# Patient Record
Sex: Female | Born: 1945 | Race: Black or African American | Hispanic: No | Smoking: Former smoker
Health system: Southern US, Community
[De-identification: ages and names within clinical notes are randomized; demographics above are authoritative.]

## PROBLEM LIST (undated history)

## (undated) DIAGNOSIS — I10 Essential (primary) hypertension: Secondary | ICD-10-CM

## (undated) DIAGNOSIS — E039 Hypothyroidism, unspecified: Secondary | ICD-10-CM

## (undated) DIAGNOSIS — E669 Obesity, unspecified: Secondary | ICD-10-CM

## (undated) DIAGNOSIS — L299 Pruritus, unspecified: Principal | ICD-10-CM

## (undated) DIAGNOSIS — R300 Dysuria: Secondary | ICD-10-CM

## (undated) DIAGNOSIS — E11610 Type 2 diabetes mellitus with diabetic neuropathic arthropathy: Secondary | ICD-10-CM

## (undated) DIAGNOSIS — N390 Urinary tract infection, site not specified: Secondary | ICD-10-CM

## (undated) DIAGNOSIS — E785 Hyperlipidemia, unspecified: Secondary | ICD-10-CM

## (undated) DIAGNOSIS — R002 Palpitations: Secondary | ICD-10-CM

## (undated) DIAGNOSIS — I4891 Unspecified atrial fibrillation: Secondary | ICD-10-CM

## (undated) HISTORY — DX: Obesity, unspecified: E66.9

## (undated) HISTORY — DX: Dysuria: R30.0

## (undated) HISTORY — PX: BIOPSY THYROID: PRO38

## (undated) HISTORY — DX: Urinary tract infection, site not specified: N39.0

## (undated) HISTORY — DX: Unspecified atrial fibrillation: I48.91

## (undated) HISTORY — DX: Essential (primary) hypertension: I10

## (undated) HISTORY — DX: Hypothyroidism, unspecified: E03.9

## (undated) HISTORY — DX: Pruritus, unspecified: L29.9

## (undated) HISTORY — DX: Palpitations: R00.2

## (undated) HISTORY — DX: Hyperlipidemia, unspecified: E78.5

## (undated) HISTORY — DX: Type 2 diabetes mellitus with diabetic neuropathic arthropathy: E11.610

---

## 1979-07-23 HISTORY — PX: TUBAL LIGATION: SHX77

## 1994-09-21 HISTORY — PX: CHOLECYSTECTOMY: SHX55

## 2001-06-28 ENCOUNTER — Encounter: Payer: Self-pay | Admitting: *Deleted

## 2001-06-28 ENCOUNTER — Emergency Department (HOSPITAL_COMMUNITY): Admission: EM | Admit: 2001-06-28 | Discharge: 2001-06-28 | Payer: Self-pay | Admitting: *Deleted

## 2002-02-22 ENCOUNTER — Encounter: Payer: Self-pay | Admitting: Cardiology

## 2002-03-01 ENCOUNTER — Ambulatory Visit (HOSPITAL_COMMUNITY): Admission: RE | Admit: 2002-03-01 | Discharge: 2002-03-01 | Payer: Self-pay | Admitting: Cardiology

## 2002-06-18 ENCOUNTER — Ambulatory Visit (HOSPITAL_COMMUNITY): Admission: RE | Admit: 2002-06-18 | Discharge: 2002-06-18 | Payer: Self-pay | Admitting: Internal Medicine

## 2002-07-23 ENCOUNTER — Ambulatory Visit (HOSPITAL_COMMUNITY): Admission: RE | Admit: 2002-07-23 | Discharge: 2002-07-23 | Payer: Self-pay | Admitting: Internal Medicine

## 2002-11-07 ENCOUNTER — Ambulatory Visit (HOSPITAL_COMMUNITY): Admission: RE | Admit: 2002-11-07 | Discharge: 2002-11-07 | Payer: Self-pay | Admitting: Family Medicine

## 2002-11-07 ENCOUNTER — Encounter: Payer: Self-pay | Admitting: Family Medicine

## 2004-11-01 ENCOUNTER — Ambulatory Visit: Payer: Self-pay | Admitting: Cardiology

## 2005-08-20 ENCOUNTER — Ambulatory Visit (HOSPITAL_COMMUNITY): Admission: RE | Admit: 2005-08-20 | Discharge: 2005-08-20 | Payer: Self-pay | Admitting: Family Medicine

## 2005-09-13 ENCOUNTER — Ambulatory Visit: Payer: Self-pay | Admitting: *Deleted

## 2005-11-21 HISTORY — PX: COLONOSCOPY: SHX174

## 2006-01-25 ENCOUNTER — Ambulatory Visit: Payer: Self-pay | Admitting: Internal Medicine

## 2006-02-09 ENCOUNTER — Ambulatory Visit (HOSPITAL_COMMUNITY): Admission: RE | Admit: 2006-02-09 | Discharge: 2006-02-09 | Payer: Self-pay | Admitting: Family Medicine

## 2006-02-10 ENCOUNTER — Ambulatory Visit: Payer: Self-pay | Admitting: Internal Medicine

## 2006-02-10 ENCOUNTER — Ambulatory Visit (HOSPITAL_COMMUNITY): Admission: RE | Admit: 2006-02-10 | Discharge: 2006-02-10 | Payer: Self-pay | Admitting: Internal Medicine

## 2006-04-10 ENCOUNTER — Ambulatory Visit: Payer: Self-pay | Admitting: Internal Medicine

## 2006-04-20 ENCOUNTER — Ambulatory Visit (HOSPITAL_COMMUNITY): Admission: RE | Admit: 2006-04-20 | Discharge: 2006-04-20 | Payer: Self-pay | Admitting: Family Medicine

## 2006-10-06 ENCOUNTER — Ambulatory Visit (HOSPITAL_COMMUNITY): Admission: RE | Admit: 2006-10-06 | Discharge: 2006-10-06 | Payer: Self-pay | Admitting: Family Medicine

## 2007-10-24 ENCOUNTER — Other Ambulatory Visit: Admission: RE | Admit: 2007-10-24 | Discharge: 2007-10-24 | Payer: Self-pay | Admitting: Obstetrics and Gynecology

## 2007-11-22 HISTORY — PX: RETINAL DETACHMENT SURGERY: SHX105

## 2007-12-11 ENCOUNTER — Ambulatory Visit (HOSPITAL_COMMUNITY): Admission: RE | Admit: 2007-12-11 | Discharge: 2007-12-11 | Payer: Self-pay | Admitting: Family Medicine

## 2008-01-08 ENCOUNTER — Ambulatory Visit (HOSPITAL_COMMUNITY): Admission: RE | Admit: 2008-01-08 | Discharge: 2008-01-09 | Payer: Self-pay | Admitting: Ophthalmology

## 2008-03-06 ENCOUNTER — Ambulatory Visit (HOSPITAL_COMMUNITY): Admission: RE | Admit: 2008-03-06 | Discharge: 2008-03-06 | Payer: Self-pay | Admitting: Family Medicine

## 2008-03-31 ENCOUNTER — Ambulatory Visit: Payer: Self-pay | Admitting: Cardiology

## 2008-04-24 ENCOUNTER — Ambulatory Visit: Payer: Self-pay | Admitting: Cardiology

## 2008-04-29 ENCOUNTER — Ambulatory Visit: Payer: Self-pay | Admitting: Cardiology

## 2008-05-30 ENCOUNTER — Ambulatory Visit: Payer: Self-pay | Admitting: Cardiology

## 2008-09-11 ENCOUNTER — Ambulatory Visit: Payer: Self-pay | Admitting: Cardiology

## 2008-11-06 ENCOUNTER — Other Ambulatory Visit: Admission: RE | Admit: 2008-11-06 | Discharge: 2008-11-06 | Payer: Self-pay | Admitting: Obstetrics and Gynecology

## 2008-11-07 ENCOUNTER — Ambulatory Visit (HOSPITAL_COMMUNITY): Admission: RE | Admit: 2008-11-07 | Discharge: 2008-11-07 | Payer: Self-pay | Admitting: Obstetrics & Gynecology

## 2009-03-09 ENCOUNTER — Ambulatory Visit (HOSPITAL_COMMUNITY): Admission: RE | Admit: 2009-03-09 | Discharge: 2009-03-09 | Payer: Self-pay | Admitting: Family Medicine

## 2009-03-20 ENCOUNTER — Ambulatory Visit: Payer: Self-pay | Admitting: Cardiology

## 2009-03-20 ENCOUNTER — Emergency Department (HOSPITAL_COMMUNITY): Admission: EM | Admit: 2009-03-20 | Discharge: 2009-03-21 | Payer: Self-pay | Admitting: Emergency Medicine

## 2009-05-04 ENCOUNTER — Encounter: Payer: Self-pay | Admitting: Cardiology

## 2009-05-04 LAB — CONVERTED CEMR LAB
ALT: 14 units/L
Alkaline Phosphatase: 95 units/L
BUN: 12 mg/dL
CO2: 20 meq/L
Chloride: 103 meq/L
Creatinine, Ser: 0.84 mg/dL
Glucose, Bld: 104 mg/dL
Hemoglobin: 13.3 g/dL
MCV: 106.2 fL
Potassium: 4.7 meq/L
Total Protein: 6.9 g/dL
Triglycerides: 139 mg/dL

## 2009-05-12 ENCOUNTER — Encounter (INDEPENDENT_AMBULATORY_CARE_PROVIDER_SITE_OTHER): Payer: Self-pay | Admitting: *Deleted

## 2009-06-29 ENCOUNTER — Encounter: Payer: Self-pay | Admitting: Cardiology

## 2009-06-30 DIAGNOSIS — E1165 Type 2 diabetes mellitus with hyperglycemia: Secondary | ICD-10-CM

## 2009-06-30 DIAGNOSIS — I4891 Unspecified atrial fibrillation: Secondary | ICD-10-CM

## 2009-06-30 DIAGNOSIS — E0590 Thyrotoxicosis, unspecified without thyrotoxic crisis or storm: Secondary | ICD-10-CM

## 2009-06-30 DIAGNOSIS — I10 Essential (primary) hypertension: Secondary | ICD-10-CM | POA: Insufficient documentation

## 2009-06-30 DIAGNOSIS — E785 Hyperlipidemia, unspecified: Secondary | ICD-10-CM

## 2009-08-11 ENCOUNTER — Ambulatory Visit: Payer: Self-pay | Admitting: Cardiology

## 2009-08-11 DIAGNOSIS — R002 Palpitations: Secondary | ICD-10-CM

## 2009-08-21 ENCOUNTER — Encounter (INDEPENDENT_AMBULATORY_CARE_PROVIDER_SITE_OTHER): Payer: Self-pay | Admitting: *Deleted

## 2009-08-21 ENCOUNTER — Encounter: Payer: Self-pay | Admitting: Cardiology

## 2009-08-21 LAB — CONVERTED CEMR LAB
Cholesterol: 199 mg/dL
HDL: 67 mg/dL (ref >39)
TSH: 0.714 microintl units/mL
Total CHOL/HDL Ratio: 3
VLDL: 19 mg/dL (ref 0–40)

## 2009-08-25 ENCOUNTER — Encounter: Payer: Self-pay | Admitting: Cardiology

## 2009-11-09 ENCOUNTER — Encounter (INDEPENDENT_AMBULATORY_CARE_PROVIDER_SITE_OTHER): Payer: Self-pay

## 2009-11-09 LAB — CONVERTED CEMR LAB
ALT: 8 units/L
Alkaline Phosphatase: 85 units/L
BUN: 15 mg/dL
Bilirubin, Direct: 0.1 mg/dL
Chloride: 106 meq/L
Glucose, Bld: 96 mg/dL
Hemoglobin: 11.6 g/dL
Potassium: 4.5 meq/L

## 2009-12-11 ENCOUNTER — Other Ambulatory Visit: Admission: RE | Admit: 2009-12-11 | Discharge: 2009-12-11 | Payer: Self-pay | Admitting: Obstetrics and Gynecology

## 2010-03-26 ENCOUNTER — Encounter (INDEPENDENT_AMBULATORY_CARE_PROVIDER_SITE_OTHER): Payer: Self-pay | Admitting: *Deleted

## 2010-04-26 ENCOUNTER — Encounter (INDEPENDENT_AMBULATORY_CARE_PROVIDER_SITE_OTHER): Payer: Self-pay | Admitting: *Deleted

## 2010-05-03 ENCOUNTER — Encounter (INDEPENDENT_AMBULATORY_CARE_PROVIDER_SITE_OTHER): Payer: Self-pay | Admitting: *Deleted

## 2010-05-03 ENCOUNTER — Ambulatory Visit: Payer: Self-pay | Admitting: Cardiology

## 2010-05-03 DIAGNOSIS — R609 Edema, unspecified: Secondary | ICD-10-CM | POA: Insufficient documentation

## 2010-05-06 ENCOUNTER — Ambulatory Visit (HOSPITAL_COMMUNITY): Admission: RE | Admit: 2010-05-06 | Discharge: 2010-05-06 | Payer: Self-pay | Admitting: Cardiology

## 2010-05-06 ENCOUNTER — Ambulatory Visit (HOSPITAL_COMMUNITY): Admission: RE | Admit: 2010-05-06 | Discharge: 2010-05-06 | Payer: Self-pay | Admitting: Family Medicine

## 2010-05-07 ENCOUNTER — Encounter (INDEPENDENT_AMBULATORY_CARE_PROVIDER_SITE_OTHER): Payer: Self-pay | Admitting: *Deleted

## 2010-06-02 ENCOUNTER — Encounter (INDEPENDENT_AMBULATORY_CARE_PROVIDER_SITE_OTHER): Payer: Self-pay | Admitting: *Deleted

## 2010-06-02 LAB — CONVERTED CEMR LAB
Cholesterol: 181 mg/dL (ref 0–200)
HDL: 60 mg/dL
HDL: 60 mg/dL (ref >39)
LDL Cholesterol: 103 mg/dL
Total CHOL/HDL Ratio: 3
Triglycerides: 88 mg/dL (ref <150)

## 2010-07-27 ENCOUNTER — Telehealth (INDEPENDENT_AMBULATORY_CARE_PROVIDER_SITE_OTHER): Payer: Self-pay

## 2010-08-17 ENCOUNTER — Encounter: Payer: Self-pay | Admitting: Cardiology

## 2010-10-29 ENCOUNTER — Encounter (INDEPENDENT_AMBULATORY_CARE_PROVIDER_SITE_OTHER): Payer: Self-pay | Admitting: *Deleted

## 2010-11-23 ENCOUNTER — Encounter (INDEPENDENT_AMBULATORY_CARE_PROVIDER_SITE_OTHER): Payer: Self-pay | Admitting: *Deleted

## 2010-11-24 ENCOUNTER — Ambulatory Visit
Admission: RE | Admit: 2010-11-24 | Discharge: 2010-11-24 | Payer: Self-pay | Source: Home / Self Care | Attending: Cardiology | Admitting: Cardiology

## 2010-11-24 ENCOUNTER — Encounter (INDEPENDENT_AMBULATORY_CARE_PROVIDER_SITE_OTHER): Payer: Self-pay | Admitting: *Deleted

## 2010-12-12 ENCOUNTER — Encounter: Payer: Self-pay | Admitting: Family Medicine

## 2010-12-21 NOTE — Letter (Signed)
Summary: DR Lucianne Muss OFFICE NOTE  DR Lucianne Muss OFFICE NOTE   Imported By: Raechel Ache [facility] 08/17/2010 16:16:03  _____________________________________________________________________  External Attachment:    Type:   Image     Comment:   External Document

## 2010-12-21 NOTE — Letter (Signed)
Summary: Appointment - Reminder 2  [facility] [address]   [place], [place] [address]   Phone: [phone]  Fax: [phone]     October 29, 2010 MRN: [id number]   MATHILDE MCWHERTER [address] [place], [place]  [address]   Dear Ms. CONSTANTINE,  Our records indicate that it is time to schedule a follow-up appointment.  Dr.    Dietrich Pates      recommended that you follow up with [place] in    12.2011        . It is very important that we reach you to schedule this appointment. We look forward to participating in your health care needs. Please contact us at the number listed above at your earliest convenience to schedule your appointment.  If you are unable to make an appointment at this time, give [place] a call so we can update our records.     Sincerely,   Glass blower/designer

## 2010-12-21 NOTE — Assessment & Plan Note (Signed)
Summary: 8 mth fu per checkout on 08/11/09/tg   Visit Type:  Follow-up Referring Provider:  Dr. Drinda Butts Primary Provider:  Dr. Mirna Mires   History of Present Illness: [name] returns to the office for continued assessment and treatment of paroxysmal atrial fibrillation and multiple cardiovascular risk factors, most notably diabetes, hypertension and hyperlipidemia.  Since her last visit, she has generally done well.  Blood pressure control has been good when assessed at home.  She has not been hospitalized or required evaluation in the emergency department.  She reports no new medical issues.  Current Medications (verified): 1)  Amlodipine Besylate 10 Mg Tabs (Amlodipine Besylate) .... Take One Tablet By Mouth Daily 2)  Diovan 320 Mg Tabs (Valsartan) .... Take 1 Tablet By Mouth Once A Day 3)  Metoprolol Tartrate 50 Mg Tabs (Metoprolol Tartrate) .... Take 1/2 Tablet By Mouth Twice A Day 4)  Klor-Con M20 20 Meq Cr-Tabs (Potassium Chloride Crys Cr) .... Take Two Tablets By Mouth Everyday 5)  Torsemide 20 Mg Tabs (Torsemide) .... Take One Tablet By Mouth Every Other Day 6)  Prilosec 20 Mg Cpdr (Omeprazole) .... Take 1 Tablet By Mouth Once A Day 7)  Alprazolam 0.25 Mg Tabs (Alprazolam) .... Take 1 Tab By Mouth At Bedtime 8)  Synthroid 75 Mcg Tabs (Levothyroxine Sodium) .... Take 1 Tablet By Mouth Once A Day 9)  Metformin Hcl 850 Mg Tabs (Metformin Hcl) .... Take 1 Tab Two Times A Day 10)  Lantus 100 Unit/ml Soln (Insulin Glargine) .... Take 47 Units 1 Time Daily 11)  Novolog 100 Unit/ml Soln (Insulin Aspart) .... Sliding Scale 12)  Pravastatin Sodium 10 Mg Tabs (Pravastatin Sodium) .... Take One Tablet By Mouth Daily At Bedtime  Allergies (verified): No Known Drug Allergies  Past History:  PMH, FH, and Social History reviewed and updated.  Past Medical History: PAROXYSMAL ATRIAL FIBRILLATION (ICD-427.31) ; LVH; nl EF; onset in 1999 HYPERTENSION (ICD-401.9) DIABETES MELLITUS,  TYPE II (ICD-250.00)--insulin; managed by Dr. Wynelle Cleveland (ICD-272.4) Palpitations--negative event recorder in 2009 HYPOTHYROIDISM (ICD-244.9); history of goiter; nl TSH off medication Charcot joint--left lower extremity  Family History: Father:deceased in his 70s;cause unknown Mother:deceasedd ue to diabetes  Review of Systems  The patient denies anorexia, weight loss, weight gain, vision loss, decreased hearing, hoarseness, chest pain, syncope, dyspnea on exertion, peripheral edema, prolonged cough, headaches, hemoptysis, abdominal pain, melena, and hematochezia.    Vital Signs:  Patient profile:   65 year old female Weight:      254 pounds Pulse rate:   76 / minute BP sitting:   155 / 62  (right arm)  Vitals Entered By: [name], CNA (May 03, 2010 11:33 AM)  Physical Exam  General:  Obese; well developed; no acute distress:   Neck-No JVD; soft left carotid bruits; moderate thyromegaly, more prominent on the right. Lungs-No tachypnea, no rales; no rhonchi; no wheezes; decreased breath sounds at the bases. Cardiovascular-normal PMI; normal S1 and S2; grade 2/6 basilar systolic ejection murmur Abdomen-BS normal; soft and non-tender without masses or organomegaly:  Musculoskeletal-No deformities, no cyanosis or clubbing: Neurologic-Normal cranial nerves; symmetric strength and tone:  Skin-Warm, no significant lesions: Extremities-Nl distal pulses; 1-2+ edema:     Impression & Recommendations:  Problem # 1:  HYPERTENSION (ICD-401.9) Blood pressure was initially elevated, but, on repeat testing, a value of 135/65 was obtained.  Patient reports similar measurements at home.  She appears to have a component of whitecoat hypertension, which will be assessed at future visits.  Problem # 2:  GOITER, UNSPECIFIED (ICD-240.9) TSH was normal after patient had stopped taking thyroid replacement therapy for a number of months; however, she has subsequently resumed all of  her previous medication.  Levothyroxine was included, and continuing use will help to suppress further thyroid enlargement.    Problem # 3:  HYPERLIPIDEMIA (ICD-272.4)  Lipid profile is good 6 months ago with total cholesterol of 144, triglycerides of 104, HDL of 59 an LDL of 69.  Unfortunately, patient developed myalgias to simvastatin, which he discontinued.  Pravastatin will be substituted a dose of 10 mg q.d. with a repeat lipid profile in one month and subsequent readjustment of lipid-lowering therapy as appropriate.  Control of hyperlipidemia is definitely indicated in the setting of insulin requiring diabetes  Problem # 4:  PAROXYSMAL ATRIAL FIBRILLATION (ICD-427.31) No signs or symptoms to indicate continuing AF.  Problem # 5:  DIABETES MELLITUS, TYPE II (ICD-250.00) Control is reasonable based on CBGs.  Problem # 6:  EDEMA, ANKLES (ICD-782.3) This problem likely represents venous insufficiency exacerbated by amlodipine.  Since current medications for hypertension her working well, and these will not be modified for the time being.  Patient will try conservative measures including leg elevation and salt restriction.  I will plan to see this nice woman again in 6 months.  If she is doing well at that time, I anticipate annual visits thereafter.  Other Orders: Carotid Duplex (Carotid Duplex) Future Orders: T-Lipid Profile ([id number]) ... 06/02/2010  Patient Instructions: 1)  Your physician recommends that you schedule a follow-up appointment in: 6 MONTHS 2)  Your physician recommends that you return for lab work in: 1 MONTH 3)  Your physician has recommended you make the following change in your medication:  START PRAVASTATIN 10MG  DAILY 4)  Your physician has requested that you limit the intake of sodium (salt) in your diet to four grams daily. Please see [facility] handout. 5)  Your physician has requested that you have a carotid duplex. This test is an ultrasound of the carotid arteries  in your neck. It looks at blood flow through these arteries that supply the brain with blood. Allow one hour for this exam. There are no restrictions or special instructions. 6)  LOW SODIUM DIET 7)  LEG ELEVATION 8)  INCREASE EXERCISE  9)  DECREASE CALORIC INTAKE Prescriptions: PRAVASTATIN SODIUM 10 MG TABS (PRAVASTATIN SODIUM) Take one tablet by mouth daily at bedtime  #30 x 3   Entered by:   Teressa Lower RN   Authorized by:   Kathlen Brunswick, MD, [facility]   Signed by:   Teressa Lower RN on 05/03/2010   Method used:   Electronically to        [facility]* (retail)       [address]       [place], [place]  [address]       Ph: [phone]       Fax: [phone]   RxID:   [id number]

## 2010-12-21 NOTE — Progress Notes (Signed)
Summary: REfill  Phone Note Call from Patient   Caller: Patient Reason for Call: Refill Medication Summary of Call: pt states she needs refill for Amlodopine called to [facility]/tg Initial call taken by: Raechel Ache [facility],  July 27, 2010 11:12 AM    New/Updated Medications: AMLODIPINE BESYLATE 10 MG TABS (AMLODIPINE BESYLATE) Take one tablet by mouth daily Prescriptions: AMLODIPINE BESYLATE 10 MG TABS (AMLODIPINE BESYLATE) Take one tablet by mouth daily  #30 x 3   Entered by:   Larita Fife Via LPN   Authorized by:   Kathlen Brunswick, MD, [facility]   Signed by:   Larita Fife Via LPN on 16/08/9603   Method used:   Electronically to        [facility] (retail)       [address] [place], [place]  [address]       Ph: [phone]       Fax: [phone]   RxID:   [id number]

## 2010-12-21 NOTE — Letter (Signed)
Summary: [place] Results Engineer, agricultural at [facility]  [address] [place] [address]   Phone: [phone]  Fax: [phone]      May 07, 2010 MRN: [id number]   Brittney [name] [address] [place], [place]  [address]   Dear Ms. [name],  Your test ordered by Selena Batten has been reviewed by your physician (or physician assistant) and was found to be normal or stable. Your physician (or physician assistant) felt no changes were needed at this time.  ____ Echocardiogram  ____ Cardiac Stress Test  ____ Lab Work  __x__ Peripheral vascular study of arms, legs or neck  ____ CT scan or X-ray  ____ Lung or Breathing test  ____ Other:  No change in medical treatment at this time, per Dr. Dietrich Pates.  Thank you, [name] RN    [place] Bing, MD, Lenise Arena.C.Gaylord Shih, MD, F.A.C.C Lewayne Bunting, MD, F.A.C.C Nona Dell, MD, F.A.C.C Charlton Haws, MD, Lenise Arena.C.C

## 2010-12-21 NOTE — Letter (Signed)
Summary: [place] [facility] Work Engineer, agricultural at [facility]  [address] [place] [address]   Phone: [phone]  Fax: [phone]     May 03, 2010 MRN: [id number]   Brittney [name] [address] [place], [place]  [address]      YOUR LAB WORK IS DUE  June 02, 2010 _________________________________________  Please go to [facility], located across the street from [facility] on the second floor.  Hours are Monday - Friday 7am until 7:30pm         Saturday 8am until 12noon    _X_  DO NOT EAT OR DRINK AFTER MIDNIGHT EVENING PRIOR TO LABWORK  __ YOUR LABWORK IS NOT FASTING --YOU MAY EAT PRIOR TO LABWORK

## 2010-12-21 NOTE — Miscellaneous (Signed)
Summary: LABS LIPIDS,TSH,08/21/2009  Clinical Lists Changes  Observations: Added new observation of HDL: 113 mg/dL (63/87/5643 32:95) Added new observation of TRIGLYC TOT: 97 mg/dL (18/84/1660 63:01) Added new observation of CHOLESTEROL: 199 mg/dL (60/08/9322 55:73) Added new observation of TSH: 0.714 microintl units/mL (08/21/2009 11:32)

## 2010-12-21 NOTE — Miscellaneous (Signed)
Summary: RX FOR AMLODIPINE 10MG  1 DAILY  Clinical Lists Changes  Medications: Rx of AMLODIPINE BESYLATE 10 MG TABS (AMLODIPINE BESYLATE) Take one tablet by mouth daily;  #30 x 3;  Signed;  Entered by: [name], CNA;  Authorized by: Kathlen Brunswick, MD, [facility];  Method used: Electronically to [facility] [address] [place], [place], [place]  [address], Ph: [phone], Fax: [phone]    Prescriptions: AMLODIPINE BESYLATE 10 MG TABS (AMLODIPINE BESYLATE) Take one tablet by mouth daily  #30 x 3   Entered by:   [name], CNA   Authorized by:   Kathlen Brunswick, MD, [facility]   Signed by:   [name], CNA on 03/26/2010   Method used:   Electronically to        [facility] (retail)       [address]       [place], [place]  [address]       Ph: [phone]       Fax: [phone]   RxID:   [id number]

## 2010-12-23 NOTE — Miscellaneous (Signed)
Summary: LABS LIPIDS,06/02/2010  Clinical Lists Changes  Observations: Added new observation of LDL: 103 mg/dL (84/69/6295 28:41) Added new observation of HDL: 60 mg/dL (32/44/0102 72:53) Added new observation of TRIGLYC TOT: 88 mg/dL (66/44/0347 42:59) Added new observation of CHOLESTEROL: 181 mg/dL (56/38/7564 33:29)

## 2010-12-23 NOTE — Letter (Signed)
Summary: [place] [facility] Work Engineer, agricultural at [facility]  [address] [place] [address]   Phone: [phone]  Fax: [phone]     November 24, 2010 MRN: [id number]   Brittney [name] [address] [place], [place]  [address]      YOUR LAB WORK IS DUE   FEBRUARY 22, 2012_  Please go to [facility], located across the street from [facility] on the second floor.  Hours are Monday - Friday 7am until 7:30pm         Saturday 8am until 12noon    _X_  DO NOT EAT OR DRINK AFTER MIDNIGHT EVENING PRIOR TO LABWORK

## 2010-12-23 NOTE — Miscellaneous (Signed)
Summary: CAROTID 05/06/2010  Clinical Lists Changes  Observations: Added new observation of US CAROTID:  Findings:    RIGHT CAROTID ARTERY: There is mild plaque at the carotid   bifurcation without high-grade stenosis.  Normal wave forms and   color Doppler signal.    RIGHT VERTEBRAL ARTERY:  Normal flow direction and waveform.    LEFT CAROTID ARTERY: Minimal plaque at the carotid bifurcation and   in the bulb without high-grade stenosis.  Normal wave forms with no   and color Doppler signal.    LEFT VERTEBRAL ARTERY:  Normal flow direction and waveform.    The thyroid is enlarged and heterogeneous in appearance.    IMPRESSION:    1.  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 The exam does not exclude plaque   ulceration or embolization.  Continued surveillance recommended.    Read By:  Deanne Coffer, D. Reuel Boom,  M.D. (05/06/2010 12:35)      Carotid Doppler  Procedure date:  05/06/2010  Findings:       Findings:    RIGHT CAROTID ARTERY: There is mild plaque at the carotid   bifurcation without high-grade stenosis.  Normal wave forms and   color Doppler signal.    RIGHT VERTEBRAL ARTERY:  Normal flow direction and waveform.    LEFT CAROTID ARTERY: Minimal plaque at the carotid bifurcation and   in the bulb without high-grade stenosis.  Normal wave forms with no   and color Doppler signal.    LEFT VERTEBRAL ARTERY:  Normal flow direction and waveform.    The thyroid is enlarged and heterogeneous in appearance.    IMPRESSION:    1.  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 The exam does not exclude plaque   ulceration or embolization.  Continued surveillance recommended.    Read By:  Deanne Coffer, D. Reuel Boom,  M.D.

## 2010-12-23 NOTE — Assessment & Plan Note (Signed)
Summary: E4V   Visit Type:  Follow-up Referring Provider:  Dr. Drinda Butts Primary Provider:  Dr. Mirna Mires   History of Present Illness: no cardiology complaints 6 mth fu carotids 05/06/2010,lipids 06/02/2010 patient was to start pravastatin from last ov but forgot so has not started this med.  This is a 65 year old African American female patient who is here for six-month followup for her excess mitral fibrillation, hypertension, and hyperlipidemia. Her last office visit she was supposed to start pravastatin 10 mg daily but she forgot.  The patient has occasional palpitations that occur when she is upset or is having a bad dream and wakes up. She has brief fluttering in her chest that eases quickly and spontaneously. Blood pressure is elevated a little bit today but she does admit to eating a lot of canned foods and processed foods. She also has some leg cramps at night but they are usually when she takes torsemide. She does have blood work by Dr. Lucianne Muss as one month ago and was told it was normal.  Current Medications (verified): 1)  Amlodipine Besylate 10 Mg Tabs (Amlodipine Besylate) .... Take One Tablet By Mouth Daily 2)  Diovan 320 Mg Tabs (Valsartan) .... Take 1 Tablet By Mouth Once A Day 3)  Metoprolol Tartrate 50 Mg Tabs (Metoprolol Tartrate) .... Take 1/2 Tablet By Mouth Twice A Day 4)  Klor-Con M20 20 Meq Cr-Tabs (Potassium Chloride Crys Cr) .... Take Two Tablets By Mouth Everyday 5)  Torsemide 20 Mg Tabs (Torsemide) .... Take Prn 6)  Prilosec 20 Mg Cpdr (Omeprazole) .... Take 1 Tablet By Mouth Once A Day 7)  Alprazolam 0.25 Mg Tabs (Alprazolam) .... Take 1 Tab By Mouth At Bedtime 8)  Metformin Hcl 850 Mg Tabs (Metformin Hcl) .... Take 1 Tab Two Times A Day 9)  Lantus 100 Unit/ml Soln (Insulin Glargine) .... Take 47 Units 1 Time Daily 10)  Novolog 100 Unit/ml Soln (Insulin Aspart) .... Sliding Scale 11)  Pravastatin Sodium 10 Mg Tabs (Pravastatin Sodium) .... Take One Tablet By  Mouth Daily At Bedtime  Allergies (verified): No Known Drug Allergies  Comments:  Nurse/Medical Assistant: Dr.Comer stopped patients levothyroxine and she has never started her pravastatin from last ov  Past History:  Past Medical History: Last updated: 05/03/2010 PAROXYSMAL ATRIAL FIBRILLATION (ICD-427.31) ; LVH; nl EF; onset in 1999 HYPERTENSION (ICD-401.9) DIABETES MELLITUS, TYPE II (ICD-250.00)--insulin; managed by Dr. Wynelle Cleveland (ICD-272.4) Palpitations--negative event recorder in 2009 HYPOTHYROIDISM (ICD-244.9); history of goiter; nl TSH off medication Charcot joint--left lower extremity  Social History: Last updated: 06/30/2009 Retired  Married  Tobacco Use - No.  Alcohol Use - no Regular Exercise - no Drug Use - no  Review of Systems       see history of present illness  Vital Signs:  Patient profile:   65 year old female Weight:      246 pounds BMI:     39.85 O2 Sat:      95 % on Room air Pulse rate:   74 / minute BP sitting:   156 / 77  (left arm)  Vitals Entered By: [name], CNA (November 24, 2010 1:16 PM)  O2 Flow:  Room air  Physical Exam  General:   Well-nournished, in no acute distress. Neck: No JVD, HJR, Bruit, or thyroid enlargement Lungs: No tachypnea, clear without wheezing, rales, or rhonchi Cardiovascular: RRR, PMI not displaced, heart sounds normal, no murmurs, gallops, bruit, thrill, or heave. Abdomen: BS normal. Soft without organomegaly, masses, lesions  or tenderness. Extremities: without cyanosis, clubbing or edema. Good distal pulses bilateral SKin: Warm, no lesions or rashes  Musculoskeletal: No deformities Neuro: no focal signs    Impression & Recommendations:  Problem # 1:  PAROXYSMAL ATRIAL FIBRILLATION (ICD-427.31) Patient only has occasional palpitations when she is under stress or anxiety. This is short lived and stable. Her updated medication list for this problem includes:    Metoprolol Tartrate 50  Mg Tabs (Metoprolol tartrate) .Marland Kitchen... Take 1/2 tablet by mouth twice a day  Problem # 2:  HYPERTENSION (ICD-401.9) Patient's blood pressure is elevated today but she does admit to eating a lot of canned and processed foods. She prefers to try to adjust her diet and followup with Dr. [name] later on this month with her blood pressure. Her updated medication list for this problem includes:    Amlodipine Besylate 10 Mg Tabs (Amlodipine besylate) .Marland Kitchen... Take one tablet by mouth daily    Diovan 320 Mg Tabs (Valsartan) .Marland Kitchen... Take 1 tablet by mouth once a day    Metoprolol Tartrate 50 Mg Tabs (Metoprolol tartrate) .Marland Kitchen... Take 1/2 tablet by mouth twice a day    Torsemide 20 Mg Tabs (Torsemide) .Marland Kitchen... Take prn  Future Orders: T-Lipid Profile ([id number]) ... 01/12/2011 T-Comprehensive Metabolic Panel [id number]) ... 01/12/2011  Problem # 3:  HYPERLIPIDEMIA (ICD-272.4) Patient had an abnormal lipid profile 6 months ago and was started on pravastatin which she forgot to do. We will start this today and check a lipid profile and LFTs in 6 weeks. The following medications were removed from the medication list:    Pravastatin Sodium 10 Mg Tabs (Pravastatin sodium) .Marland Kitchen... Take one tablet by mouth daily at bedtime Her updated medication list for this problem includes:    Pravastatin Sodium 10 Mg Tabs (Pravastatin sodium) .Marland Kitchen... Take one tablet by mouth daily at bedtime  Future Orders: T-Lipid Profile ([id number]) ... 01/12/2011 T-Comprehensive Metabolic Panel [id number]) ... 01/12/2011  Patient Instructions: 1)  Your physician recommends that you schedule a follow-up appointment in: 6 months 2)  Your physician recommends that you return for lab work in:6 weeks 3)  Your physician has requested that you limit the intake of sodium (salt) in your diet to two grams daily. Please see [facility] handout. Prescriptions: PRAVASTATIN SODIUM 10 MG TABS (PRAVASTATIN SODIUM) Take one tablet by mouth daily at bedtime  #30 x  3   Entered by:   Teressa Lower RN   Authorized by:   Kathlen Brunswick, MD, [facility]   Signed by:   Teressa Lower RN on 11/24/2010   Method used:   Electronically to        [facility] (retail)       [address]       [place], [place]  [address]       Ph: [phone]       Fax: [phone]   RxID:   [id number]

## 2011-01-12 ENCOUNTER — Encounter: Payer: Self-pay | Admitting: Cardiology

## 2011-01-12 LAB — CONVERTED CEMR LAB
Albumin: 4.1 g/dL
CO2: 28 meq/L
Creatinine, Ser: 1.1 mg/dL
HDL: 59 mg/dL
LDL Cholesterol: 102 mg/dL
Potassium: 5.3 meq/L
Sodium: 142 meq/L
Total Protein: 7.1 g/dL
Triglycerides: 86 mg/dL

## 2011-01-17 ENCOUNTER — Encounter (INDEPENDENT_AMBULATORY_CARE_PROVIDER_SITE_OTHER): Payer: Self-pay | Admitting: *Deleted

## 2011-01-17 LAB — CONVERTED CEMR LAB
ALT: 9 units/L (ref 0–35)
AST: 11 units/L (ref 0–37)
CO2: 28 meq/L (ref 19–32)
Calcium: 9.6 mg/dL (ref 8.4–10.5)
Total Bilirubin: 0.4 mg/dL (ref 0.3–1.2)
Triglycerides: 86 mg/dL (ref <150)

## 2011-01-19 ENCOUNTER — Telehealth (INDEPENDENT_AMBULATORY_CARE_PROVIDER_SITE_OTHER): Payer: Self-pay | Admitting: *Deleted

## 2011-01-27 NOTE — Progress Notes (Addendum)
Summary: Diet information  Phone Note Call from Patient Call back at Home Phone [phone]   Caller: PT Reason for Call: Talk to Nurse Summary of Call: PT HAS QUESTIONS ABOUT PAPER THAT WAS SUPOSE TO BE SENT TO HER IN MAIL Initial call taken by: Faythe Ghee,  January 19, 2011 2:21 PM  Follow-up for Phone Call        lmom Follow-up by: Teressa Lower RN,  January 20, 2011 8:38 AM  Additional Follow-up for Phone Call Additional follow up Details #1::        mailed list of kcl rich foods to avoid Additional Follow-up by: Teressa Lower RN,  January 20, 2011 11:07 AM

## 2011-01-27 NOTE — Letter (Signed)
Summary: [place] [facility] Work Engineer, agricultural at [facility]  [address] [place] [address]   Phone: [phone]  Fax: [phone]     January 17, 2011 MRN: [id number]   Brittney [name] [address] [place], [place]  [address]      YOUR LAB WORK IS DUE   February 15, 2011  Please go to [facility], located across the street from [facility] on the second floor.  Hours are Monday - Friday 7am until 7:30pm         Saturday 8am until 12noon     _X_ YOUR LABWORK IS NOT FASTING --YOU MAY EAT PRIOR TO LABWORK

## 2011-01-27 NOTE — Miscellaneous (Signed)
Summary: CMP, LIPID  Clinical Lists Changes  Observations: Added new observation of CALCIUM: 9.6 mg/dL (56/43/3295 18:84) Added new observation of ALBUMIN: 4.1 g/dL (16/60/6301 60:10) Added new observation of PROTEIN, TOT: 7.1 g/dL (93/23/5573 22:02) Added new observation of SGPT (ALT): 9 units/L (01/12/2011 11:20) Added new observation of SGOT (AST): 11 units/L (01/12/2011 11:20) Added new observation of ALK PHOS: 96 units/L (01/12/2011 11:20) Added new observation of BILI DIRECT: TOTAL BILI 0.4 mg/dL (54/27/0623 76:28) Added new observation of CREATININE: 1.10 mg/dL (31/51/7616 07:37) Added new observation of BUN: 17 mg/dL (10/62/6948 54:62) Added new observation of BG RANDOM: 133 mg/dL (70/35/0093 81:82) Added new observation of CO2 PLSM/SER: 28 meq/L (01/12/2011 11:20) Added new observation of CL SERUM: 104 meq/L (01/12/2011 11:20) Added new observation of K SERUM: 5.3 meq/L (01/12/2011 11:20) Added new observation of NA: 142 meq/L (01/12/2011 11:20) Added new observation of LDL: 102 mg/dL (99/37/1696 78:93) Added new observation of HDL: 59 mg/dL (81/11/7508 25:85) Added new observation of TRIGLYC TOT: 86 mg/dL (27/78/2423 53:61) Added new observation of CHOLESTEROL: 178 mg/dL (44/31/5400 86:76)

## 2011-02-16 ENCOUNTER — Other Ambulatory Visit: Payer: Self-pay | Admitting: Cardiology

## 2011-02-16 LAB — BASIC METABOLIC PANEL
BUN: 17 mg/dL (ref 6–23)
CO2: 25 mEq/L (ref 19–32)
Chloride: 103 mEq/L (ref 96–112)
Glucose, Bld: 219 mg/dL — ABNORMAL HIGH (ref 70–99)
Potassium: 5.2 mEq/L (ref 3.5–5.3)

## 2011-02-22 ENCOUNTER — Other Ambulatory Visit: Payer: Self-pay | Admitting: Cardiology

## 2011-02-22 NOTE — Telephone Encounter (Signed)
Would like labwork results/tg

## 2011-03-01 ENCOUNTER — Telehealth: Payer: Self-pay | Admitting: Cardiology

## 2011-03-01 LAB — CBC
HCT: 33.5 % — ABNORMAL LOW (ref 36.0–46.0)
Hemoglobin: 11.3 g/dL — ABNORMAL LOW (ref 12.0–15.0)
MCV: 86 fL (ref 78.0–100.0)
WBC: 12.1 10*3/uL — ABNORMAL HIGH (ref 4.0–10.5)

## 2011-03-01 LAB — COMPREHENSIVE METABOLIC PANEL
ALT: 13 U/L (ref 0–35)
AST: 16 U/L (ref 0–37)
BUN: 13 mg/dL (ref 6–23)
Potassium: 4.3 mEq/L (ref 3.5–5.1)
Total Protein: 7.4 g/dL (ref 6.0–8.3)

## 2011-03-01 LAB — DIFFERENTIAL
Basophils Absolute: 0.1 10*3/uL (ref 0.0–0.1)
Basophils Relative: 1 % (ref 0–1)
Eosinophils Absolute: 0.2 10*3/uL (ref 0.0–0.7)

## 2011-03-01 NOTE — Telephone Encounter (Signed)
LAB RESULTS

## 2011-03-01 NOTE — Telephone Encounter (Signed)
Verbalized understanding

## 2011-03-02 LAB — URINALYSIS, ROUTINE W REFLEX MICROSCOPIC
Bilirubin Urine: NEGATIVE
Glucose, UA: NEGATIVE mg/dL
Specific Gravity, Urine: 1.01 (ref 1.005–1.030)
pH: 5.5 (ref 5.0–8.0)

## 2011-04-05 NOTE — Op Note (Signed)
NAME:  Brittney [name], Brittney [name] NO.:  [id number]   MEDICAL RECORD NO.:  [id number]          PATIENT TYPE:  OIB   LOCATION:  5127                         FACILITY:  [facility]   PHYSICIAN:  Beulah Gandy. Ashley Royalty, M.D. DATE OF BIRTH:  [birth year]   DATE OF PROCEDURE:  01/08/2008  DATE OF DISCHARGE:                               OPERATIVE REPORT   ADMISSION DIAGNOSIS:  1. Complex traction retinal detachment right eye.  2. Proliferative diabetic retinopathy, right eye.  3. Preretinal fibrosis, right eye.   PROCEDURES:  Repair of complex retinal detachment with pars plana  vitrectomy, panretinal photocoagulation, membrane peel, right eye.   SURGEON:  Beulah Gandy. Ashley Royalty, M.D.   ASSISTANT:  Rosalie Doctor, MA   ANESTHESIA:  General.   DETAILS:  Usual prep and drape.  Sclerotomies at 8, 10, and 2 o'clock.  The 5-mm infusion port anchored into place at 8 o'clock.  The lighted  pick and the cutter were placed at 10 and 2 o'clock respectively.  Provisc placed on the corneal surface.  The pars plana vitrectomy was  begun just behind the pseudophakos.  Capsular remnants were removed and  vitreous debris was removed.  The vitrectomy was carried posteriorly  under biome viewing to the macular region where a complex traction  retinal detachment was seen.   The fibrotic membranes were peeled with the vitreous cutter with the  lighted pick, and with the MPC scissors until all traction was relieved  from the areas of detachment along the upper and lower arcades.  The  membranes were removed from their attachments to the disk and removed  with the vitreous cutter.  The vitrectomy was carried out to the  equator, and surface proliferation was removed.  The detachment was  allowed to lie flat once the traction was removed.   The endolaser was positioned in the eye and 890 burns were placed around  the retinal periphery.  The power was 1000 milliwatts, 1000 microns each  and 0.1 seconds each.  Once  all traction was removed and laser was  placed, a washout procedure was performed.  The instruments were removed  from the eye and 9-0 nylon was used to close the sclerotomy sites.   The conjunctiva was closed with wet-field cautery.  Polymyxin and  gentamicin were irrigated into Tenon's space.  Atropine solution was  applied.  Marcaine was injected around the globe for postop pain.  The  closing pressure was 10 with a Baer keratometer.  Decadron 10 mg was  injected into the lower subconjunctival space.  TobraDex ophthalmic  ointment, a patch, and shield were placed.  The patient was awakened,  and taken to recovery in satisfactory condition.   COMPLICATIONS:  None.   DURATION:  1 hour.      Beulah Gandy. Ashley Royalty, M.D.  Electronically Signed     JDM/MEDQ  D:  01/08/2008  T:  01/09/2008  Job:  [id number]

## 2011-04-05 NOTE — Letter (Signed)
May 30, 2008    Brittney [name]. Brittney Chance, MD  [address], [address]  [place], [place] [address]   RE:  Brittney [name], Brittney [name]  MRN:  [id number]  /  DOB:  05-Jun-1946   Dear Earvin Hansen,   Brittney [name] returns to the office for continued assessment and treatment  of hypertension and paroxysmal atrial fibrillation.  Since her last  visit, she has felt quite well.  She reports increased energy and  decreased palpitations.  She notes no dyspnea nor chest discomfort.  She  has been walking fairly frequently with benefit.   MEDICATIONS:  Unchanged from her last admission evaluation except for  discontinuation of diltiazem, addition of amlodipine 2.5 mg daily, and  addition of metoprolol 25 mg b.i.d.  Her dose of levothyroxine was  decreased as noted in my last report to you.  Unfortunately, she  misunderstood these directions and also discontinued Diovan.   PHYSICAL EXAMINATION:  GENERAL:  Very pleasant overweight woman in no  acute distress.  VITAL SIGNS:  The weight is 266, 2 pounds more than at her last visit,  but 6 pounds less than 2 months ago.  Blood pressure 145/80, heart rate  75 and regular, and respirations 14.  NECK:  No jugular venous distention; no carotid bruits.  LUNGS:  Clear.  CARDIAC:  Normal first and second heart sounds; modest basilar systolic  ejection murmur.  EXTREMITIES:  Trace edema.   IMPRESSION:  Brittney [name] is doing generally well.  Due to the fact that  she has diabetes, use of an angiotensin receptor blocker would be  desirable.  Diovan would be resumed.  This should adequately control  blood pressure.  If she continues to require additional medication, I  would recommend increasing the dose of amlodipine.  I will reassess this  nice woman in 4 months.  A chemistry profile and repeat TSH will be  obtained in 3 weeks.    Sincerely,      Gerrit Friends. Dietrich Pates, MD, [facility]  Electronically Signed    RMR/MedQ  DD: 05/30/2008  DT: 05/31/2008  Job #: [id number]

## 2011-04-05 NOTE — Letter (Signed)
April 29, 2008    Brittney [name]. Brittney Chance, MD  [address], [address]  Brittney [name], Brittney [name] [id number]   RE:  Brittney [name], Brittney [name]  MRN:  [id number]  /  DOB:  1946/08/20   Dear Brittney [name],   Brittney [name] returns to the office for continued assessment and treatment  of paroxysmal atrial fibrillation.  Since last visit, her symptoms have  improved.  She did have 3 or 4 symptomatic spells while on [place]  Telemetry.  A rhythm at these times was normal sinus.  No atrial  fibrillation was documented over a 3-week recording interval.   Medications are unchanged from her last visit.   On exam, pleasant woman in no acute distress.  The weight is 264, 8 pounds less than the last month.  Blood pressure  140/65, heart rate 70 and regular, and respirations 16.  NECK:  No jugular venous distention; no carotid bruits.  LUNGS:  Clear.  CARDIAC:  Normal first and second heart sounds; 1-2/6 systolic ejection  murmur at the cardiac base.  ABDOMEN:  Soft and nontender; no organomegaly.  EXTREMITIES:  Ankle edema 1/2+.   TSH level was fairly low at 0.48.   IMPRESSION:  Brittney [name] is symptomatic in the absence of atrial  fibrillation.  She does not use any significant over-the-counter  stimulants.  Her caffeine intake is modest.  We will start metoprolol 25  mg b.i.d. in an attempt to suppress her symptoms.  This might cause  excessive  bradycardia with diltiazem, which will be discontinued and substituted  by amlodipine, initially at a dose of 2.5 mg daily so as not to  exacerbate edema.  She will reduce levothyroxine to 1 tablet of 0.075 mg  4 days per week and 1/2 tablet 3 days per week.  She will monitor blood  pressures at home and return to see me in 1 month.    Sincerely,      Gerrit Friends. Dietrich Pates, MD, [facility]  Electronically Signed    RMR/MedQ  DD: 04/29/2008  DT: 04/30/2008  Job #: [id number]

## 2011-04-05 NOTE — Assessment & Plan Note (Signed)
[facility]                       Brittney [name] CARDIOLOGY OFFICE NOTE   Brittney [name]                      MRN:          [id number]  DATE:09/11/2008                            DOB:          Mar 31, 1946    CARDIOLOGIST:  Brittney Friends. Dietrich Pates, MD, [facility]   PRIMARY CARE PHYSICIAN:  Brittney Friendly. Hill, MD   REASON FOR VISIT:  Three-month followup.   HISTORY OF PRESENT ILLNESS:  Brittney [name] is a 65 year old female patient  with a history of reported paroxysmal atrial fibrillation and multiple  cardiovascular risk factors including diabetes mellitus, hypertension,  hyperlipidemia, and hypothyroidism with goiter who presents to the  office today for routine followup.  She continues to have occasional  palpitations.  These are overall improved since she started seeing [place]  several months ago.  She notices it when she becomes excited or does  sudden exertion.  Otherwise, she denies any tachy palpitations.  She  denies any syncope or near syncope.  She had some discomfort at her  first rib on the left couple of days ago.  This seemed to be related to  positional changes.  She took some Tylenol and it subsided.  She denies  any substernal chest heaviness or tightness.  She denies any exertional  chest discomfort.  She denies any significant dyspnea with exertion.  She describes NYHA class II symptoms.  She sleeps on 3 pillows  chronically.  She denies any PND.  She has chronic pedal edema that is  stable without significant change.   CURRENT MEDICATIONS:  K-Dur 20 mEq 2 tablets daily, Omeprazole 20 mg  daily, Diovan 320 mg daily, Torsemide 20 mg 1 daily alternated with 2  every other day, Xanax 0.25 mg nightly,  Actos 15 mg daily, Amlodipine 2.5 mg daily, Metoprolol 25 mg b.i.d.,  Levoxyl 75 mcg half a tablet alternated with a whole tablet every other  day, Lantus 40 units daily,  NovoLog sliding scale insulin.   PHYSICAL EXAMINATION:  GENERAL:  She is a  well-nourished, well-developed  female in no acute distress.  VITAL SIGNS:  Blood pressure is 140/68 on the right, 130/74 on the left,  pulse 68, weight 263 pounds.  HEENT:  Normal.  NECK:  Without JVD.  CARDIAC:  Normal S1 and S3.  Regular rate and rhythm.  No appreciable  murmur.  LUNGS:  Clear to auscultation bilaterally.  ABDOMEN:  Soft, nontender.  EXTREMITIES:  Trace to 1+ edema bilaterally.  Calves are soft,  nontender.  SKIN:  Warm and dry.  NEUROLOGIC:  She is alert and oriented x3.  Cranial nerves II through  XII grossly intact.  ENDOCRINE:  She does have diffuse enlargement of the right lobe of her  thyroid.   DATABASE:  Labs from July 10, 2008, potassium 4.9, creatinine 1.16.  LFTs okay.  TSH 0.652.   ASSESSMENT AND PLAN:  1. Palpitations.  The patient has a reported history of paroxysmal      atrial fibrillation.  This is overall stable.  She had an event      monitor in May 2009 that demonstrated normal sinus rhythm  and sinus      tachycardia and borderline first degree atrioventricular block.      There is no evidence that she has been on warfarin in the past.  I      do not see any recent evidence of atrial fibrillation.  She      certainly has an elevated thromboembolic risk factor profile with a      CHADS2 score of 2.  She would certainly be a candidate for Coumadin      should she have recurrent atrial fibrillation documented in the      future.  However, at this time she will be asked to start on an      aspirin a day.  She thinks that she is able to tolerate this.  She      denies any history of gastrointestinal bleeding.  2. Hypertension.  This is overall fairly well controlled.  Her goal is      less than 130/80 with history of diabetes mellitus.  I have asked      her to go ahead and increase her amlodipine to 5 mg daily.  This      should keep her at her goal.  We will check a BMET in the next 1-2      months to follow up on her renal function and  potassium.  3. Hypothyroidism.  She had a recent TSH checked by Brittney [name] in      August.  We will make sure she has another followup TSH in the next      1-2 months.  4. Diabetes mellitus.  She is followed by Dr. Lucianne [name] now in [place]      for her diabetes.   DISPOSITION:  The patient will be brought back in followup with Dr.  Dietrich [name] in the next 6 months or sooner p.r.n.      Tereso Newcomer, PA-C  Electronically Signed      Brittney Friends. Dietrich Pates, MD, [facility]  Electronically Signed   SW/MedQ  DD: 09/11/2008  DT: 09/12/2008  Job #: [id number]   cc:   Brittney Friendly. [name], MD  Reather Littler, M.D.

## 2011-04-05 NOTE — Letter (Signed)
Mar 31, 2008    [name]. [name], MD  [facility]  [address]  [place], [place]  [address]   RE:  DESIREE, DAISE  MRN:  [id number]  /  DOB:  [birth year]   Dear Earvin Hansen:   It was my pleasure to evaluate Ms. Froio in the office today in  consultation at your request.  I had previously followed her, but she  was lost to follow-up approximately four years ago.  Over that interval,  she has done generally well.  She has been treated for thyroid  enlargement and hypothyroidism.  She has not been hospitalized nor  required any urgent care.  She continues to have diabetes and is  maintained on insulin with good control.  Hypertension has apparently  been under good control.  She is fairly active without symptoms except  for those related to her Charcot joint of the left ankle.   In recent months, she has noted episodes of palpitations.  These  typically occur when she has been active or at night when she retires  for the evening.  She cannot determine whether they are regular or  irregular.  There are no associated symptoms.  She senses these for a  matter of minutes to an hour or two.   CURRENT MEDICATIONS:  1. KCl 40 mEq daily.  2. Omeprazole 20 mg daily.  3. Diovan 320 mg daily.  4. Levothyroxine 0.075 mg daily.  5. Diltiazem 240 mg daily.  6. Furosemide 20 mg per day alternating with 40 mg per day.  7. Xanax 0.25 mg nightly.  8. Insulin 70/30 40 units q.a.m. and 25 units q.p.m. if her CBG is      elevated.  9. Actos 15 mg daily.   Social history, family history and review of systems were updated.  There were no notable changes.   PHYSICAL EXAMINATION:  GENERAL APPEARANCE:  An overweight woman in no  acute distress.  VITAL SIGNS:  The weight is 272, 6 pounds more than in December 2005.  Blood pressure 140/65, heart rate 75 and regular, respirations 16.  NECK:  No jugular venous distention; normal carotid upstrokes without  bruits.  ENDOCRINE:  Moderate  enlargement of the right lobe of the thyroid.  LUNGS:  Clear.  CARDIAC:  Normal first and second heart sounds; fourth heart sound  present.  ABDOMEN:  Soft and nontender; no organomegaly.  EXTREMITIES:  Trace edema; distal pulses intact.  NEUROLOGIC:  Symmetric strength and tone; normal cranial nerves.  SKIN:  No significant abnormalities.   EKG:  Normal sinus rhythm; nonspecific T-wave abnormality.  Compared  with a prior tracing of February 22, 2002, there is no significant interval  change.   IMPRESSION:  Ms. Vessey is experiencing palpitations.  We will provide  her with an event recorder to obtain tracings while she is symptomatic.  A TSH level will be obtained.  A recent chemistry profile and CBC are  normal.  Lipid profile is good.  I will plan to reassess this nice woman  in one month.  Thanks so much for sending her to see me.    Sincerely,      Gerrit Friends. Dietrich Pates, MD, [facility]  Electronically Signed    RMR/MedQ  DD: 03/31/2008  DT: 03/31/2008  Job #: [id number]

## 2011-04-05 NOTE — Letter (Signed)
March 20, 2009    Dr. Mirna Mires, MD  [address] [place],  [place], [place] [address]   RE:  PALAK, TERCERO  MRN:  [id number]  /  DOB:  1946/08/28   Dear Earvin Hansen:   Ms. Heideman returns to the office for continued assessment treatment of a  history of paroxysmal atrial fibrillation without any documented recent  events, hypertension, hyperlipidemia, and diabetes.  Since her last  visit, she has done generally well.  She reports some low back pain for  the past week radiating to her left flank and wonders if she has an  urinary tract infection.  She has been active, but has class II dyspnea  on exertion.  She has no chest discomfort.  She is monitored blood  pressure in the drug store with values typically 120/80.  She has not  been told of any problems with her lipids.  She does note palpitations  when she first lies down at night to go to sleep.   CURRENT MEDICATIONS:  Unchanged from her last visit except for the  addition of metformin 850 mg b.i.d.   PHYSICAL EXAMINATION:  GENERAL:  Pleasant overweight woman in no acute  distress.  VITAL SIGNS:  The weight is 254, 9 pounds less than at her last visit,  and 18 pounds down from her peak weight.  Blood pressure 140/80.  Heart  rate is 62 and regular.  NECK:  No jugular venous distention; no carotid bruits.  LUNGS:  Clear.  CARDIAC:  Normal first and second heart sounds; modest systolic ejection  murmur.  ABDOMEN:  Soft and nontender; no bruits; no organomegaly.  EXTREMITIES:  1/2+ ankle edema, slightly more prominent on the left.   Recent laboratory includes an anemia workup that was negative.  I do not  have the underlying CBC.  The most recent cholesterol I can find is from  last year at which time total cholesterol was 183, triglycerides 80, HDL  63, and LDL 104.   IMPRESSION:  Ms. Gruenberg is doing well overall.  In the absence of  documentation of atrial fibrillation, I am not incline to expose her to  chronic anticoagulation.   Her blood pressure control is adequate, but  not optimal.  We will increase her dose of amlodipine to 10 mg daily.  She is not on a statin and despite having a significant risk for  coronary disease.  Simvastatin 40 mg daily will be added to her medical  regime.  She will continue to monitor blood pressure at home, continue  to attempt to lose weight and returns to see me in 8 months.  A  chemistry profile and lipid profile will be checked in 2 months.    Sincerely,      Gerrit Friends. Dietrich Pates, MD, [facility]  Electronically Signed    RMR/MedQ  DD: 03/20/2009  DT: 03/21/2009  Job #: [id number]   CC:    Reather Littler, M.D.

## 2011-04-08 NOTE — H&P (Signed)
Brittney [name], Brittney [name]               ACCOUNT NO.:  [id number]   MEDICAL RECORD NO.:  [id number]           PATIENT TYPE:   LOCATION:                                FACILITY:  [facility]   PHYSICIAN:  Lionel December, M.D.    DATE OF BIRTH:  05-May-1946   DATE OF ADMISSION:  01/25/2006  DATE OF DISCHARGE:  LH                                HISTORY & PHYSICAL   CHIEF COMPLAINT:  Rectal discomfort, problems with bowel movements.   PRIMARY CARE PHYSICIAN:  [name]. [name], M.D.   HISTORY OF PRESENT ILLNESS:  Brittney [name] is a 65 year old African American female  who presents as a self referral today for further evaluation of recent  rectal discomfort and change in her bowel movements.  She says for several  years now she has had alternating constipation and increased frequency of  stools.  Some days she may have 2 to 3 stools, and then she may go 2 to 3  days without a bowel movement.  She denies any abdominal pain, nausea or  vomiting, heartburn.  Denies any melena or rectal bleeding.  A couple of  weeks ago she had a 2-day history of rectal pain.  She felt tissue around  her anus.  She is not sure if it was a hemorrhoid.  She denies any dysphagia  odynophagia.  She has had acid reflux for about three years which is well  controlled on Prevacid.  Denies any prior EGD or colonoscopy.   CURRENT MEDICATIONS:  1.  Potassium 20 mEq b.i.d.  2.  Diovan 240 mg every day.  3.  Prevacid 30 mg every day.  4.  Humulin 70/30, 40 units in the morning and 25 units in the evening.  5.  Levoxyl once a day but on Sundays twice a day.  6.  Diltiazem 360 mg daily.  7.  Actos 20 mg daily.   ALLERGIES:  No known drug allergies.   PAST MEDICAL HISTORY:  1.  Hypertension.  2.  Diabetes mellitus.  3.  Hypercholesterolemia.  4.  Hypothyroidism.  5.  Gastroesophageal reflux disease.   PAST SURGICAL HISTORY:  1.  Cholecystectomy.  2.  Eye surgery.  3.  Cesarean section.   FAMILY HISTORY:  Mother had diabetes  mellitus.  Father died of unknown type  cancer.  she does not recall any family history of colon cancer.   SOCIAL HISTORY:  She is separated, has three children.  She has unemployed.  She smoked as a teenager.  Denies any alcohol use.   REVIEW OF SYSTEMS:  See HPI for GI.  CONSTITUTIONAL:  Denies any weight  loss.  CARDIOPULMONARY:  Denies any chest pain or shortness of breath.   PHYSICAL EXAMINATION:  VITAL SIGNS:  Weight 279, height 5 foot 6 inches,  temp 97.9, blood pressure 160/70, pulse 80.  GENERAL:  A pleasant, morbidly obese, black female in no acute distress.  SKIN:  Warm and dry.  No jaundice.  HEENT:  Conjunctivae are pink.  Sclerae are nonicteric.  Oropharyngeal  mucosa is moist and pink.  No lesions, erythema or exudate.  No  lymphadenopathy.  She has enlarged right thyroid gland.  CHEST/LUNGS:  Clear to auscultation.  CARDIAC:  Reveals a regular rate and rhythm.  Normal S1, S2.  No murmurs,  rubs, or gallops.  ABDOMEN:  Positive bowel sounds.  Obese but symmetrical.  Soft, nontender.  No organomegaly or masses appreciated but limited due to body habitus.  EXTREMITIES:  No edema.  RECTAL:  Reveals no masses externally.  Rectal exam is nontender.  Secretions are heme negative.   IMPRESSION:  Brittney [name] is a 65 year old lady with chronic alternating  constipation and diarrhea.  Recently had a 2-day history of rectal pain  which may have been a rectal fissure or hemorrhoid.  Currently, rectal exam  is unremarkable.   She has never had a colonoscopy, recommend one at this time primarily for  screening purposes.   PLAN:  1.  Colonoscopy.  2.  She will take half her regular dose of Humulin and Actos the day of the      prep.  3.  Trial of MiraLax 17 grams daily as needed for constipation, #527 grams,      5 refills given.      Tana Coast, P.A.      Lionel December, M.D.  Electronically Signed    LL/MEDQ  D:  01/25/2006  T:  01/25/2006  Job:  [id number]   cc:    [name]. [name], MD  Fax: [phone]

## 2011-04-08 NOTE — Op Note (Signed)
NAME:  Brittney [name], Brittney [name]               ACCOUNT NO.:  [id number]   MEDICAL RECORD NO.:  [id number]          PATIENT TYPE:  AMB   LOCATION:  DAY                           FACILITY:  [facility]   PHYSICIAN:  Lionel December, M.D.    DATE OF BIRTH:  1946/09/09   DATE OF PROCEDURE:  02/10/2006  DATE OF DISCHARGE:  02/10/2006                                 OPERATIVE REPORT   PROCEDURE:  Colonoscopy.   INDICATIONS:  [name] is a 65 year old African-American female with irregular  bowel movements who is also experiencing rectal discomfort. She is  undergoing diagnostic colonoscopy. Procedure and risks were reviewed with  the patient and informed consent was obtained.   MEDICINES FOR CONSCIOUS SEDATION:  Demerol 50 mg IV, Versed 8 mg IV.   FINDINGS:  Procedure performed in endoscopy suite. The patient's vital signs  and O2 saturations were monitored during procedure and remained stable. The  patient was placed in the left lateral position and rectal examination  performed. No abnormality noted on external or digital exam. Other than  increased rectal tone. The pediatric Olympus videoscope was placed carefully  across the anal canal in the rectum. It was a gradually advanced into  sigmoid colon beyond. Preparation was satisfactory. There was a tiny polyp  seen at mid transverse colon. While I was getting ready to remove it via  cold biopsy, I lost it. I looked for it for 10 minutes but could not find  it. The scope was advanced to cecum which was identified by ileocecal valve  and appendiceal orifice. Pictures taken for the record. As the scope was  withdrawn the colonic mucosa was, once again, carefully examined and no  mucosal abnormalities were noted. Rectal mucosa similarly was normal. Scope  was retroflexed to examine the anorectal junction and small hemorrhoids were  noted below the dentate line. Endoscope was straightened and withdrawn. The  patient tolerated the procedure well.   FINAL  DIAGNOSIS:  1.  Small external hemorrhoids.  2.  A 3-4 mm polyp noted at mid transverse colon, but could not be found in      order to remove it.  3.  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   RECOMMENDATIONS:  1.  She will continue high-fiber diet and MiraLax at 17 grams daily.  2.  Chew 2 tablets of fiber choice daily.  3.  Anusol-HC suppository 1 per rectum at bedtime for 2 weeks.  4.  Would recommend a repeat colonoscopy in 5 years instead of 10.  5.  She will return for OV in 2 months to make sure she is not having any      ongoing GI problems.      Lionel December, M.D.  Electronically Signed     NR/MEDQ  D:  02/10/2006  T:  02/13/2006  Job:  [id number]

## 2011-05-23 ENCOUNTER — Other Ambulatory Visit: Payer: Self-pay | Admitting: Cardiology

## 2011-06-16 ENCOUNTER — Encounter: Payer: Self-pay | Admitting: Cardiology

## 2011-06-21 ENCOUNTER — Ambulatory Visit (INDEPENDENT_AMBULATORY_CARE_PROVIDER_SITE_OTHER): Payer: Medicare Other | Admitting: Cardiology

## 2011-06-21 ENCOUNTER — Encounter: Payer: Self-pay | Admitting: Cardiology

## 2011-06-21 DIAGNOSIS — I4891 Unspecified atrial fibrillation: Secondary | ICD-10-CM

## 2011-06-21 DIAGNOSIS — E039 Hypothyroidism, unspecified: Secondary | ICD-10-CM

## 2011-06-21 DIAGNOSIS — E1149 Type 2 diabetes mellitus with other diabetic neurological complication: Secondary | ICD-10-CM

## 2011-06-21 DIAGNOSIS — E11610 Type 2 diabetes mellitus with diabetic neuropathic arthropathy: Secondary | ICD-10-CM

## 2011-06-21 DIAGNOSIS — I10 Essential (primary) hypertension: Secondary | ICD-10-CM

## 2011-06-21 DIAGNOSIS — E119 Type 2 diabetes mellitus without complications: Secondary | ICD-10-CM

## 2011-06-21 DIAGNOSIS — E785 Hyperlipidemia, unspecified: Secondary | ICD-10-CM

## 2011-06-21 MED ORDER — PRAVASTATIN SODIUM 40 MG PO TABS: 40.0000 mg | ORAL_TABLET | Freq: Every day | ORAL | Status: DC

## 2011-06-21 MED ORDER — METOPROLOL TARTRATE 50 MG PO TABS: 50.0000 mg | ORAL_TABLET | Freq: Two times a day (BID) | ORAL | Status: DC

## 2011-06-21 NOTE — Assessment & Plan Note (Signed)
Lipid profile slightly suboptimal on a very low dose of statin.  Pravachol will be increased to 40 mg q.d. With a repeat lipid profile in one month.

## 2011-06-21 NOTE — Assessment & Plan Note (Signed)
Diabetic control is good, but not yet optimal.  She will continue to work with Dr. Lucianne Muss to achieve this and will continue to try to lose weight.

## 2011-06-21 NOTE — Patient Instructions (Addendum)
**Note De-Identified Yahel Fuston Obfuscation** Your physician has recommended you make the following change in your medication: increase Metoprolol to 50 mg twice daily and Pravachol to 40 mg at bedtime  Your physician has requested that you regularly monitor and record your blood pressure readings at home. Please use the same machine at the same time of day to check your readings and record them to bring to your follow-up visit. You may check blood pressure at pharmacy when you can.   Your physician recommends that you schedule a follow-up appointment in: Blood pressure check with nurse in 1 month and with MD in 1 year

## 2011-06-21 NOTE — Progress Notes (Signed)
HPI : Brittney [name] returns to the office for continued assessment and treatment of cardiovascular risk factors.  Since her last visit, she has done beautifully.  She is working with a Data processing manager, has increased exercise and has lost 25 pounds.  She experiences rare palpitations, typically associated with exertion, that are not particularly troublesome to her.  She has not monitored blood pressure at home.  She has worked with Dr. Lucianne [name] to improve diabetic control.  A1c was 7.7 last month.  Current Outpatient Prescriptions on File Prior to Visit  Medication Sig Dispense Refill  . ALPRAZolam (XANAX) 0.25 MG tablet Take 0.25 mg by mouth at bedtime as needed.        . insulin aspart (NOVOLOG) 100 UNIT/ML injection Inject into the skin. Sliding Scale       . insulin glargine (LANTUS) 100 UNIT/ML injection Inject 47 Units into the skin daily.        . metFORMIN (GLUCOPHAGE) 850 MG tablet Take 850 mg by mouth 2 (two) times daily with a meal.        . NORVASC 10 MG tablet TAKE (1) TABLET BY MOUTH ONCE DAILY.  30 each  6  . omeprazole (PRILOSEC) 20 MG capsule Take 20 mg by mouth daily.        . potassium chloride SA (K-DUR,KLOR-CON) 20 MEQ tablet Take 20 mEq by mouth daily. As needed with demadex       . torsemide (DEMADEX) 20 MG tablet Take 20 mg by mouth daily as needed.        . valsartan (DIOVAN) 320 MG tablet Take 320 mg by mouth daily.        Marland Kitchen DISCONTD: pravastatin (PRAVACHOL) 10 MG tablet Take 10 mg by mouth at bedtime.           No Known Allergies    Past medical history, social history, and family history reviewed and updated.  ROS: Denies orthopnea, PND, exertional dyspnea, pedal edema, lightheadedness or syncope.  PHYSICAL EXAM: BP 150/66  Pulse 69  Ht 5\' 6"  (1.676 m)  Wt 99.791 kg (220 lb)  BMI 35.51 kg/m2  SpO2 97%  General-Well developed; no acute distress Body habitus-mildly to moderately obese Neck-No JVD; no carotid bruits; thyromegaly present with nodule on the right Lungs-clear  lung fields; resonant to percussion Cardiovascular-normal PMI; normal S1 and S2; modest systolic ejection murmur Abdomen-normal bowel sounds; soft and non-tender without masses or organomegaly Musculoskeletal-No deformities, no cyanosis or clubbing Neurologic-Normal cranial nerves; symmetric strength and tone Skin-Warm, no significant lesions Extremities-distal pulses intact; no edema  ASSESSMENT AND PLAN:

## 2011-06-21 NOTE — Assessment & Plan Note (Signed)
Blood pressure control is mildly suboptimal.  Metoprolol will be increased to 50 mg b.i.d.  Patient will collect additional blood pressure values at local pharmacies and return in one month for reassessment by the cardiology nurses.

## 2011-06-21 NOTE — Assessment & Plan Note (Signed)
No clinical evidence for recurrent arrhythmia.

## 2011-06-24 ENCOUNTER — Other Ambulatory Visit (HOSPITAL_COMMUNITY): Payer: Self-pay | Admitting: Family Medicine

## 2011-06-24 ENCOUNTER — Telehealth: Payer: Self-pay | Admitting: Cardiology

## 2011-06-24 DIAGNOSIS — Z139 Encounter for screening, unspecified: Secondary | ICD-10-CM

## 2011-06-24 NOTE — Telephone Encounter (Signed)
Patient would like return phone call / tg  °

## 2011-06-24 NOTE — Telephone Encounter (Signed)
Per pt' request large digital bp cuff rx ordered and sent to [facility]

## 2011-07-05 ENCOUNTER — Ambulatory Visit (HOSPITAL_COMMUNITY)
Admission: RE | Admit: 2011-07-05 | Discharge: 2011-07-05 | Disposition: A | Discharge status: Home / Self Care | Payer: Medicare Other | Source: Ambulatory Visit | Attending: Family Medicine | Admitting: Family Medicine

## 2011-07-05 DIAGNOSIS — Z1231 Encounter for screening mammogram for malignant neoplasm of breast: Secondary | ICD-10-CM | POA: Insufficient documentation

## 2011-07-05 DIAGNOSIS — Z139 Encounter for screening, unspecified: Secondary | ICD-10-CM

## 2011-07-14 ENCOUNTER — Ambulatory Visit (INDEPENDENT_AMBULATORY_CARE_PROVIDER_SITE_OTHER): Payer: Medicare Other | Admitting: Ophthalmology

## 2011-08-12 LAB — CBC
HCT: 35.2 — ABNORMAL LOW
Hemoglobin: 11.7 — ABNORMAL LOW
MCHC: 33.3
RBC: 4.15

## 2011-08-12 LAB — BASIC METABOLIC PANEL
CO2: 26
Calcium: 9.8
Chloride: 106
GFR calc Af Amer: 59 — ABNORMAL LOW
Potassium: 4.9
Sodium: 138

## 2011-08-26 ENCOUNTER — Ambulatory Visit (INDEPENDENT_AMBULATORY_CARE_PROVIDER_SITE_OTHER): Payer: Medicare Other | Admitting: Ophthalmology

## 2011-08-26 DIAGNOSIS — E11359 Type 2 diabetes mellitus with proliferative diabetic retinopathy without macular edema: Secondary | ICD-10-CM

## 2011-08-26 DIAGNOSIS — H35379 Puckering of macula, unspecified eye: Secondary | ICD-10-CM

## 2011-08-26 DIAGNOSIS — H43819 Vitreous degeneration, unspecified eye: Secondary | ICD-10-CM

## 2011-11-28 ENCOUNTER — Encounter (INDEPENDENT_AMBULATORY_CARE_PROVIDER_SITE_OTHER): Payer: Medicare Other | Admitting: Ophthalmology

## 2011-12-23 ENCOUNTER — Other Ambulatory Visit: Payer: Self-pay | Admitting: Physician Assistant

## 2012-01-18 ENCOUNTER — Other Ambulatory Visit (HOSPITAL_COMMUNITY): Payer: Self-pay | Admitting: Family Medicine

## 2012-01-18 ENCOUNTER — Ambulatory Visit (HOSPITAL_COMMUNITY)
Admission: RE | Admit: 2012-01-18 | Discharge: 2012-01-18 | Disposition: A | Discharge status: Home / Self Care | Payer: Medicare Other | Source: Ambulatory Visit | Attending: Family Medicine | Admitting: Family Medicine

## 2012-01-18 DIAGNOSIS — M25559 Pain in unspecified hip: Secondary | ICD-10-CM

## 2012-01-20 ENCOUNTER — Other Ambulatory Visit: Payer: Self-pay | Admitting: Cardiology

## 2012-02-13 ENCOUNTER — Other Ambulatory Visit: Payer: Self-pay | Admitting: Cardiology

## 2012-06-12 ENCOUNTER — Other Ambulatory Visit (HOSPITAL_COMMUNITY): Payer: Self-pay | Admitting: Family Medicine

## 2012-06-12 DIAGNOSIS — IMO0001 Reserved for inherently not codable concepts without codable children: Secondary | ICD-10-CM

## 2012-06-20 ENCOUNTER — Ambulatory Visit: Payer: Medicare Other | Admitting: Cardiology

## 2012-07-05 ENCOUNTER — Encounter: Payer: Self-pay | Admitting: *Deleted

## 2012-07-06 ENCOUNTER — Ambulatory Visit (HOSPITAL_COMMUNITY)
Admission: RE | Admit: 2012-07-06 | Discharge: 2012-07-06 | Disposition: A | Discharge status: Home / Self Care | Payer: Medicare Other | Source: Ambulatory Visit | Attending: Family Medicine | Admitting: Family Medicine

## 2012-07-06 ENCOUNTER — Ambulatory Visit (INDEPENDENT_AMBULATORY_CARE_PROVIDER_SITE_OTHER): Payer: Medicare Other | Admitting: Cardiology

## 2012-07-06 ENCOUNTER — Encounter: Payer: Self-pay | Admitting: Cardiology

## 2012-07-06 VITALS — BP 130/67 | HR 73 | Ht 66.0 in | Wt 244.0 lb

## 2012-07-06 DIAGNOSIS — Z1231 Encounter for screening mammogram for malignant neoplasm of breast: Secondary | ICD-10-CM | POA: Insufficient documentation

## 2012-07-06 DIAGNOSIS — I4891 Unspecified atrial fibrillation: Secondary | ICD-10-CM

## 2012-07-06 DIAGNOSIS — E119 Type 2 diabetes mellitus without complications: Secondary | ICD-10-CM

## 2012-07-06 DIAGNOSIS — E039 Hypothyroidism, unspecified: Secondary | ICD-10-CM

## 2012-07-06 DIAGNOSIS — IMO0001 Reserved for inherently not codable concepts without codable children: Secondary | ICD-10-CM

## 2012-07-06 DIAGNOSIS — E669 Obesity, unspecified: Secondary | ICD-10-CM

## 2012-07-06 DIAGNOSIS — I10 Essential (primary) hypertension: Secondary | ICD-10-CM

## 2012-07-06 HISTORY — DX: Obesity, unspecified: E66.9

## 2012-07-06 NOTE — Assessment & Plan Note (Signed)
Renewed attempt at weight loss encouraged and alternative forms of exercise discussed.

## 2012-07-06 NOTE — Assessment & Plan Note (Signed)
No symptoms to suggest recurrence.  It is appropriate to continue to observe and to withhold full anticoagulation, as there has been no documented atrial fibrillation for years.

## 2012-07-06 NOTE — Assessment & Plan Note (Signed)
Excellent control of diabetes under the management of Dr. Lucianne Muss.

## 2012-07-06 NOTE — Patient Instructions (Addendum)
Medication changes:  ONLY TAKE POTASSIUM PILLS AS NEEDED FOR LEG CRAMPS.  Your physician wants you to follow-up in: 1 year with Dr. Dietrich Pates.  You will receive a reminder letter in the mail two months in advance. If you don't receive a letter, please call our office to schedule the follow-up appointment.

## 2012-07-06 NOTE — Assessment & Plan Note (Addendum)
Blood pressure control is improved despite weight gain.  Current therapy will be continued.  Due to borderline hyperkalemia, potassium supplement will be discontinued.  Patient is advised to use this only if she develops leg cramps.

## 2012-07-06 NOTE — Assessment & Plan Note (Signed)
Followed by Dr. Lucianne Muss and recently found to be euthyroid.

## 2012-07-06 NOTE — Progress Notes (Signed)
Patient ID: Brittney [name], female   DOB: 1946-10-29, 66 y.o.   MRN: [id number]  HPI: Scheduled return visit for this very nice woman with multiple cardiovascular risk factors and a remote history of paroxysmal atrial fibrillation.  She continues to do well from a cardiac standpoint with essentially no symptoms and no documented recurrent arrhythmia.  She notes occasional palpitations, particularly at night, which are nonsustained and not particularly troublesome to her.  She has had bilateral foot problems impairing her ability to exercise and has undergone surgery by a podiatrist on the left and removal of multiple toenails for mycotic infection on the right.  Prior to Admission medications   Medication Sig Start Date End Date Taking? Authorizing Provider  ALPRAZolam (XANAX) 0.25 MG tablet Take 0.25 mg by mouth at bedtime as needed.     Yes Historical Provider, MD  DEMADEX 20 MG tablet TAKE 1 TABLET BY MOUTH ONCE DAILY AS NEEDED. 01/20/12  Yes Kathlen Brunswick, MD  insulin aspart (NOVOLOG) 100 UNIT/ML injection Inject into the skin. Sliding Scale    Yes Historical Provider, MD  insulin glargine (LANTUS) 100 UNIT/ML injection Inject 47 Units into the skin daily.     Yes Historical Provider, MD  Liraglutide (VICTOZA) 18 MG/3ML SOLN Inject 1.2 mLs into the skin.     Yes Historical Provider, MD  metFORMIN (GLUCOPHAGE) 850 MG tablet Take 850 mg by mouth 2 (two) times daily with a meal.     Yes Historical Provider, MD  metoprolol (LOPRESSOR) 50 MG tablet Take 1 tablet (50 mg total) by mouth 2 (two) times daily. 06/21/11  Yes Kathlen Brunswick, MD  NORVASC 10 MG tablet TAKE (1) TABLET BY MOUTH ONCE DAILY. 12/23/11  Yes Dyann Kief, PA  omeprazole (PRILOSEC) 20 MG capsule Take 20 mg by mouth daily.     Yes Historical Provider, MD  potassium chloride SA (K-DUR,KLOR-CON) 20 MEQ tablet TAKE 1 TABLET BY MOUTH ONCE DAILY AS NEEDED WITH TORSEMIDE 02/13/12  Yes Jonelle Sidle, MD  terbinafine (LAMISIL) 250 MG  tablet Take 1 tablet by mouth Daily. 06/13/12  Yes Historical Provider, MD  valsartan (DIOVAN) 320 MG tablet Take 320 mg by mouth daily.     Yes Historical Provider, MD   No Known Allergies    Past medical history, social history, and family history reviewed and updated.  ROS: Denies chest pain, orthopnea, PND or syncope.  She has intermittent mild pedal edema for which he uses diuretic p.r.n.  She has recently regained the weight she lost in 2012 due to decreased physical activity.  PHYSICAL EXAM: BP 130/67  Pulse 73  Ht 5\' 6"  (1.676 m)  Wt 110.678 kg (244 lb)  BMI 39.38 kg/m2  General-Well developed; no acute distress Body habitus-Obese Neck-No JVD; no carotid bruits Lungs-clear lung fields; resonant to percussion Cardiovascular-normal PMI; normal S1 and S2; modest basilar systolic ejection murmur Abdomen-normal bowel sounds; soft and non-tender without masses or organomegaly Musculoskeletal-No deformities, no cyanosis or clubbing Neurologic-Normal cranial nerves; symmetric strength and tone Skin-Warm, no significant lesions Extremities-distal pulses intact; Trace ankle edema on the right  ASSESSMENT AND PLAN:  [place] Bing, MD 07/06/2012 2:03 PM

## 2012-07-23 ENCOUNTER — Other Ambulatory Visit: Payer: Self-pay | Admitting: Physician Assistant

## 2012-07-23 ENCOUNTER — Other Ambulatory Visit: Payer: Self-pay | Admitting: Cardiology

## 2012-11-05 ENCOUNTER — Encounter (INDEPENDENT_AMBULATORY_CARE_PROVIDER_SITE_OTHER): Payer: Medicare Other | Admitting: Ophthalmology

## 2012-11-08 ENCOUNTER — Encounter (INDEPENDENT_AMBULATORY_CARE_PROVIDER_SITE_OTHER): Payer: Medicare Other | Admitting: Ophthalmology

## 2012-11-08 DIAGNOSIS — E11359 Type 2 diabetes mellitus with proliferative diabetic retinopathy without macular edema: Secondary | ICD-10-CM

## 2012-11-08 DIAGNOSIS — E1139 Type 2 diabetes mellitus with other diabetic ophthalmic complication: Secondary | ICD-10-CM

## 2012-11-08 DIAGNOSIS — H43819 Vitreous degeneration, unspecified eye: Secondary | ICD-10-CM

## 2012-11-08 DIAGNOSIS — H35379 Puckering of macula, unspecified eye: Secondary | ICD-10-CM

## 2012-11-26 ENCOUNTER — Other Ambulatory Visit (HOSPITAL_COMMUNITY): Payer: Self-pay | Admitting: Endocrinology

## 2012-11-26 ENCOUNTER — Telehealth: Payer: Self-pay | Admitting: Cardiology

## 2012-11-26 DIAGNOSIS — E049 Nontoxic goiter, unspecified: Secondary | ICD-10-CM

## 2012-11-26 MED ORDER — PRAVASTATIN SODIUM 40 MG PO TABS: 40.0000 mg | ORAL_TABLET | Freq: Every evening | ORAL | Status: DC

## 2012-11-26 NOTE — Telephone Encounter (Signed)
Pravachol 40 mg sent to [facility]

## 2012-11-26 NOTE — Telephone Encounter (Signed)
Patient needs a refill on cholesterol med but don't know the name of it.  / tg

## 2012-11-26 NOTE — Telephone Encounter (Signed)
Error/tg °

## 2012-11-29 ENCOUNTER — Ambulatory Visit (HOSPITAL_COMMUNITY)
Admission: RE | Admit: 2012-11-29 | Discharge: 2012-11-29 | Disposition: A | Discharge status: Home / Self Care | Payer: Medicare Other | Source: Ambulatory Visit | Attending: Endocrinology | Admitting: Endocrinology

## 2012-11-29 DIAGNOSIS — E042 Nontoxic multinodular goiter: Secondary | ICD-10-CM | POA: Insufficient documentation

## 2012-11-29 DIAGNOSIS — E049 Nontoxic goiter, unspecified: Secondary | ICD-10-CM

## 2012-11-30 ENCOUNTER — Other Ambulatory Visit: Payer: Self-pay | Admitting: *Deleted

## 2013-02-22 ENCOUNTER — Other Ambulatory Visit: Payer: Self-pay | Admitting: Physician Assistant

## 2013-05-09 ENCOUNTER — Ambulatory Visit (INDEPENDENT_AMBULATORY_CARE_PROVIDER_SITE_OTHER): Payer: Medicare Other | Admitting: Ophthalmology

## 2013-05-29 ENCOUNTER — Other Ambulatory Visit: Payer: Self-pay | Admitting: *Deleted

## 2013-05-29 MED ORDER — TORSEMIDE 20 MG PO TABS: ORAL_TABLET | ORAL | Status: DC

## 2013-07-03 ENCOUNTER — Ambulatory Visit: Payer: Medicare Other | Admitting: Endocrinology

## 2013-07-09 ENCOUNTER — Other Ambulatory Visit (HOSPITAL_COMMUNITY): Payer: Self-pay | Admitting: Family Medicine

## 2013-07-09 DIAGNOSIS — Z139 Encounter for screening, unspecified: Secondary | ICD-10-CM

## 2013-07-11 ENCOUNTER — Ambulatory Visit: Payer: Medicare Other | Admitting: Endocrinology

## 2013-07-15 ENCOUNTER — Ambulatory Visit (INDEPENDENT_AMBULATORY_CARE_PROVIDER_SITE_OTHER): Payer: PRIVATE HEALTH INSURANCE | Admitting: Cardiovascular Disease

## 2013-07-15 ENCOUNTER — Encounter: Payer: Self-pay | Admitting: Cardiovascular Disease

## 2013-07-15 VITALS — BP 140/66 | HR 76 | Ht 67.0 in | Wt 238.0 lb

## 2013-07-15 DIAGNOSIS — I10 Essential (primary) hypertension: Secondary | ICD-10-CM

## 2013-07-15 DIAGNOSIS — E049 Nontoxic goiter, unspecified: Secondary | ICD-10-CM

## 2013-07-15 DIAGNOSIS — I4891 Unspecified atrial fibrillation: Secondary | ICD-10-CM

## 2013-07-15 NOTE — Progress Notes (Signed)
Patient ID: Brittney [name], female   DOB: September 20, 1946, 67 y.o.   MRN: [id number]    SUBJECTIVE: Brittney [name] has a remote h/o paroxymal atrial fibrillation, along with treated HTN and a h/o hypothyroidism but is no longer on treatment for this. She denies chest pain and shortness of breath. She occasionally has leg swelling and takes a prn diuretic. She seldom has palpitations. Her diabetes is followed by Brittney [name] Vitals:   07/15/13 0830  BP: 140/66  Pulse: 76  Height: 5\' 7"  (1.702 m)  Weight: 238 lb (107.956 kg)  SpO2: 99%     PHYSICAL EXAM General: NAD Neck: No JVD, thryoid appears mildly enlarged but no tenderness, enlargement on the right side Lungs: Clear to auscultation bilaterally with normal respiratory effort. CV: Nondisplaced PMI.  Heart regular S1/S2, no S3/S4, no murmur.  No peripheral edema.  No carotid bruit.  Normal pedal pulses.  Abdomen: Soft, nontender, no hepatosplenomegaly, no distention.  Neurologic: Alert and oriented x 3.  Psych: Normal affect. Extremities: No clubbing or cyanosis.   ECG: normal sinus rhythm, 76 bpm  LABS: Basic Metabolic Panel: No results found for this basename: NA, K, CL, CO2, GLUCOSE, BUN, CREATININE, CALCIUM, MG, PHOS,  in the last 72 hours Liver Function Tests: No results found for this basename: AST, ALT, ALKPHOS, BILITOT, PROT, ALBUMIN,  in the last 72 hours No results found for this basename: LIPASE, AMYLASE,  in the last 72 hours CBC: No results found for this basename: WBC, NEUTROABS, HGB, HCT, MCV, PLT,  in the last 72 hours Cardiac Enzymes: No results found for this basename: CKTOTAL, CKMB, CKMBINDEX, TROPONINI,  in the last 72 hours BNP: No components found with this basename: POCBNP,  D-Dimer: No results found for this basename: DDIMER,  in the last 72 hours Hemoglobin A1C: No results found for this basename: HGBA1C,  in the last 72 hours Fasting Lipid Panel: No results found for this basename: CHOL, HDL,  LDLCALC, TRIG, CHOLHDL, LDLDIRECT,  in the last 72 hours Thyroid Function Tests: No results found for this basename: TSH, T4TOTAL, FREET3, T3FREE, THYROIDAB,  in the last 72 hours Anemia Panel: No results found for this basename: VITAMINB12, FOLATE, FERRITIN, TIBC, IRON, RETICCTPCT,  in the last 72 hours  RADIOLOGY: No results found.    ASSESSMENT AND PLAN: 1. Paroxysmal atrial fibrillation: she has a very remote h/o of this, and thus has never been on anticoagulation. She very seldom has palpitations which may only last a second or two. Continue Metoprolol. If she were to have documented recurrences, given her h/o HTN and diabetes, she would warrant anticoagulation. 2. HTN: borderline today, but hasn't taken her meds today. 3. Thyroid enlargement: will check a TSH and free T4.   Brittney [name], M.D., F.A.C.C.

## 2013-07-15 NOTE — Patient Instructions (Addendum)
Your physician recommends that you schedule a follow-up appointment in ONE YEAR  Your physician recommends that you return for lab work in: TODAY (SLIPS GIVEN FOR TSH,FREE T4)

## 2013-07-16 ENCOUNTER — Ambulatory Visit (HOSPITAL_COMMUNITY)
Admission: RE | Admit: 2013-07-16 | Discharge: 2013-07-16 | Disposition: A | Discharge status: Home / Self Care | Payer: Medicare Other | Source: Ambulatory Visit | Attending: Family Medicine | Admitting: Family Medicine

## 2013-07-16 ENCOUNTER — Other Ambulatory Visit: Payer: Self-pay | Admitting: *Deleted

## 2013-07-16 DIAGNOSIS — Z1231 Encounter for screening mammogram for malignant neoplasm of breast: Secondary | ICD-10-CM | POA: Insufficient documentation

## 2013-07-16 DIAGNOSIS — Z139 Encounter for screening, unspecified: Secondary | ICD-10-CM

## 2013-07-16 DIAGNOSIS — E039 Hypothyroidism, unspecified: Secondary | ICD-10-CM

## 2013-07-18 ENCOUNTER — Other Ambulatory Visit: Payer: Self-pay | Admitting: *Deleted

## 2013-07-18 DIAGNOSIS — E039 Hypothyroidism, unspecified: Secondary | ICD-10-CM

## 2013-07-29 ENCOUNTER — Other Ambulatory Visit: Payer: Self-pay | Admitting: *Deleted

## 2013-07-29 ENCOUNTER — Telehealth: Payer: Self-pay | Admitting: *Deleted

## 2013-07-29 NOTE — Telephone Encounter (Signed)
Tests were added

## 2013-07-29 NOTE — Telephone Encounter (Signed)
Message copied by Hermenia Bers on Mon Jul 29, 2013  9:09 AM ------      Message from: Reather Littler      Created: Tue Jul 16, 2013  2:18 PM       Please add free T4 and free T3, needs to see me in followup as soon as possible for possible overactive thyroid ------

## 2013-07-31 ENCOUNTER — Encounter: Payer: Self-pay | Admitting: Endocrinology

## 2013-07-31 ENCOUNTER — Ambulatory Visit (INDEPENDENT_AMBULATORY_CARE_PROVIDER_SITE_OTHER): Payer: PRIVATE HEALTH INSURANCE | Admitting: Endocrinology

## 2013-07-31 ENCOUNTER — Other Ambulatory Visit: Payer: Self-pay | Admitting: *Deleted

## 2013-07-31 VITALS — BP 126/68 | HR 76 | Temp 98.7°F | Resp 12 | Wt 243.0 lb

## 2013-07-31 DIAGNOSIS — E039 Hypothyroidism, unspecified: Secondary | ICD-10-CM

## 2013-07-31 DIAGNOSIS — E0590 Thyrotoxicosis, unspecified without thyrotoxic crisis or storm: Secondary | ICD-10-CM

## 2013-07-31 DIAGNOSIS — IMO0001 Reserved for inherently not codable concepts without codable children: Secondary | ICD-10-CM

## 2013-07-31 LAB — COMPREHENSIVE METABOLIC PANEL
ALT: 10 U/L (ref 0–35)
CO2: 27 mEq/L (ref 19–32)
Calcium: 9.2 mg/dL (ref 8.4–10.5)
Chloride: 108 mEq/L (ref 96–112)
GFR: 83.72 mL/min (ref >60.00)
Potassium: 4.4 mEq/L (ref 3.5–5.1)
Sodium: 140 mEq/L (ref 135–145)
Total Bilirubin: 0.3 mg/dL (ref 0.3–1.2)
Total Protein: 7.1 g/dL (ref 6.0–8.3)

## 2013-07-31 LAB — LIPID PANEL
HDL: 55.1 mg/dL (ref >39.00)
Total CHOL/HDL Ratio: 3

## 2013-07-31 LAB — T3, FREE: T3, Free: 3 pg/mL (ref 2.3–4.2)

## 2013-07-31 MED ORDER — GLUCOSE BLOOD VI STRP: ORAL_STRIP | Status: DC

## 2013-07-31 MED ORDER — ONETOUCH ULTRA SYSTEM W/DEVICE KIT: 1.0000 | PACK | Freq: Once | Status: DC

## 2013-07-31 NOTE — Progress Notes (Signed)
Patient ID: Brittney [name], female   DOB: 30-Nov-1945, 67 y.o.   MRN: [id number]  Brittney [name] is an 67 y.o. female.   Reason for Appointment:   History of Present Illness   THYROID:   She has had a long-standing multinodular goiter since at least 2005 which has been fairly stable clinically She does have history of occasional palpitations for years but has been treated with metoprolol by her cardiologist for this No recent problems with atrial arrhythmias or tachycardia and she subjectively does not feel any unusual palpitations No shakiness or heat intolerance  Her TSH was checked by cardiologist and was low, no referred here for further management    Type 2 DIABETES MELITUS, date of diagnosis: 1976      She has had long-standing diabetes taking insulin. Because of her difficulties with mealtime insulin she was given  [place]etta in 2011 and subsequently Victoza in addition and this helped her control However she is still erratic with her day-to-day care and has not had adequate A1c levels in the past, usually over 7%  RECENT history: She thinks she has been less compliant with her day-to-day care with dealing with family illnesses  She has been taking her Victoza and Lantus but is frequently not taking her NovoLog Has been previously told to check readings after meals to help adjust her NovoLog dose but does not do so usually  Her fasting blood sugars also been variable  Oral hypoglycemic drugs: Metformin        Side effects from medications: None Insulin regimen: Lantus 47 units daily, NovoLog 6-7 units before supper at times       Proper timing of medications in relation to meals: Yes.         Monitors blood glucose: Once a day.    Glucometer: One Touch ultra 1.          Blood Glucose readings from meter review: readings before breakfast:  118 204 122 184;  237 pcs  Hypoglycemia frequency:  none recently.          Meals: 3 meals per day.          Physical activity: exercise:  None, previously was walking             Wt Readings from Last 3 Encounters:  07/31/13 243 lb (110.224 kg)  07/15/13 238 lb (107.956 kg)  07/06/12 244 lb (110.678 kg)    Office Visit on 07/31/2013  Component Date Value Range Status  . T3, Free 07/31/2013 3.0  2.3 - 4.2 pg/mL Final  . Sodium 07/31/2013 140  135 - 145 mEq/L Final  . Potassium 07/31/2013 4.4  3.5 - 5.1 mEq/L Final  . Chloride 07/31/2013 108  96 - 112 mEq/L Final  . CO2 07/31/2013 27  19 - 32 mEq/L Final  . Glucose, Bld 07/31/2013 81  70 - 99 mg/dL Final  . BUN 62/13/0865 17  6 - 23 mg/dL Final  . Creatinine, Ser 07/31/2013 0.9  0.4 - 1.2 mg/dL Final  . Total Bilirubin 07/31/2013 0.3  0.3 - 1.2 mg/dL Final  . Alkaline Phosphatase 07/31/2013 81  39 - 117 U/L Final  . AST 07/31/2013 15  0 - 37 U/L Final  . ALT 07/31/2013 10  0 - 35 U/L Final  . Total Protein 07/31/2013 7.1  6.0 - 8.3 g/dL Final  . Albumin 78/46/9629 3.6  3.5 - 5.2 g/dL Final  . Calcium 52/84/1324 9.2  8.4 - 10.5 mg/dL Final  .  GFR 07/31/2013 83.72  >60.00 mL/min Final  . Hemoglobin A1C 07/31/2013 7.7* 4.6 - 6.5 % Final   Glycemic Control Guidelines for People with Diabetes:Non Diabetic:  <6%Goal of Therapy: <7%Additional Action Suggested:  >8%   . Cholesterol 07/31/2013 154  0 - 200 mg/dL Final   ATP III Classification       Desirable:  < 200 mg/dL               Borderline High:  200 - 239 mg/dL          High:  > = 409 mg/dL  . Triglycerides 07/31/2013 78.0  0.0 - 149.0 mg/dL Final   Normal:  <811 mg/dLBorderline High:  150 - 199 mg/dL  . HDL 07/31/2013 55.10  >39.00 mg/dL Final  . VLDL 91/47/8295 15.6  0.0 - 40.0 mg/dL Final  . LDL Cholesterol 07/31/2013 83  0 - 99 mg/dL Final  . Total CHOL/HDL Ratio 07/31/2013 3   Final                  Men          Women1/2 Average Risk     3.4          3.3Average Risk          5.0          4.42X Average Risk          9.6          7.13X Average Risk          15.0          11.0                          Medication  List       This list is accurate as of: 07/31/13 11:59 PM.  Always use your most recent med list.               ALPRAZolam 0.25 MG tablet  Commonly known as:  XANAX  Take 0.25 mg by mouth at bedtime as needed.     amLODipine 10 MG tablet  Commonly known as:  NORVASC  TAKE (1) TABLET BY MOUTH ONCE DAILY.     glucose blood test strip  Commonly known as:  ONE TOUCH ULTRA TEST  TEST 2 TIMES DAILY AS DIRECTED BY PHYSICIAN     insulin aspart 100 UNIT/ML injection  Commonly known as:  novoLOG  Inject into the skin. Sliding Scale     insulin glargine 100 UNIT/ML injection  Commonly known as:  LANTUS  Inject 47 Units into the skin daily.     metFORMIN 850 MG tablet  Commonly known as:  GLUCOPHAGE  Take 850 mg by mouth 2 (two) times daily with a meal.     metoprolol 50 MG tablet  Commonly known as:  LOPRESSOR  TAKE 1 TABLET BY MOUTH TWICE DAILY.     omeprazole 20 MG capsule  Commonly known as:  PRILOSEC  Take 20 mg by mouth daily.     ONE TOUCH ULTRA SYSTEM KIT W/DEVICE Kit  1 kit by Does not apply route once.     onetouch ultrasoft lancets     torsemide 20 MG tablet  Commonly known as:  DEMADEX  TAKE 1 TABLET BY MOUTH ONCE DAILY AS NEEDED.     valsartan 320 MG tablet  Commonly known as:  DIOVAN  Take 320 mg by mouth daily.  VICTOZA [place]  Inject into the skin. Inject 1.2 unit in skin dailly        Allergies: No Known Allergies  Past Medical History  Diagnosis Date  . Atrial fibrillation     Paroxysmal; LVH; nl EF; onset in 1999  . Hypertension   . Diabetes mellitus     insulin; managed by Dr. Lucianne Muss  . Hyperlipidemia   . Palpitations     negative event recorder in 2009  . Hypothyroidism     history of goiter; nl TSH off medication  . 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 Obesity 07/06/2012    Past Surgical History  Procedure Laterality Date  . Tubal ligation  1980's  . Cholecystectomy  11/95  . Retinal detachment surgery   2009  . Colonoscopy  2007    Family History  Problem Relation Age of Onset  . Diabetes Mother     Social History:  reports that she has quit smoking. She has never used smokeless tobacco. She reports that she does not drink alcohol or use illicit drugs.  Review of Systems:  HYPERTENSION:  this is very well controlled currently  HYPERLIPIDEMIA: The lipid abnormality consists of elevated LDL; she is supposed to be on Pravachol but her compliance with this has been previously inconsistent, LDL is now improved at 83     Examination:   BP 126/68  Pulse 76  Temp(Src) 98.7 F (37.1 C) (Oral)  Resp 12  Wt 243 lb (110.224 kg)  BMI 38.05 kg/m2  SpO2 97%  Body mass index is 38.05 kg/(m^2).   Thyroid is enlarged especially in the midline and to the right, neck circumference is 43 cm The thyroid feels firm and somewhat nodular to the right of midline where it is at least 3 times normal extending to the right lobe Left lobe is not clearly palpable  Deep tendon reflexes normal, no tremor Heart rate regular  ASSESSMENT/ PLAN::   Diabetes type 2   The patient's diabetes control appears to be not as well controlled with inconsistent hyperglycemia related to inconsistent diet as well as forgetting to take her NovoLog coverage at meals especially supper time  MULTINODULAR goiter: On exam this appears to be the same She does a lower TSH than usual but her free T4 and T3 are quite normal, no treatment is needed especially since she is asymptomatic and had no tachycardia We'll continue to monitor her thyroid levels every 3 months   Saurav Crumble 08/02/2013, 3:00 PM

## 2013-08-05 ENCOUNTER — Telehealth: Payer: Self-pay | Admitting: *Deleted

## 2013-08-05 NOTE — Telephone Encounter (Signed)
Results given to patient, she is aware

## 2013-08-05 NOTE — Telephone Encounter (Signed)
Message copied by Hermenia Bers on Mon Aug 05, 2013  1:03 PM ------      Message from: Reather Littler      Created: Wed Jul 31, 2013  9:35 PM       Thyroid is normal, no further treatment, glucose is higher, need to follow instructions given for diabetes today ------

## 2013-08-22 ENCOUNTER — Other Ambulatory Visit: Payer: Self-pay | Admitting: Cardiology

## 2013-09-09 ENCOUNTER — Ambulatory Visit: Payer: PRIVATE HEALTH INSURANCE

## 2013-09-17 ENCOUNTER — Ambulatory Visit: Payer: PRIVATE HEALTH INSURANCE

## 2013-09-23 ENCOUNTER — Other Ambulatory Visit: Payer: Self-pay

## 2013-09-23 MED ORDER — AMLODIPINE BESYLATE 10 MG PO TABS: 10.0000 mg | ORAL_TABLET | Freq: Every day | ORAL | Status: DC

## 2013-09-23 NOTE — Telephone Encounter (Signed)
Refill authorization approved to [facility] for Amlodipine 10 mg po, 1 tab daily  Sent via e-scribe

## 2013-09-24 ENCOUNTER — Ambulatory Visit: Payer: PRIVATE HEALTH INSURANCE

## 2013-09-25 ENCOUNTER — Ambulatory Visit: Payer: PRIVATE HEALTH INSURANCE | Admitting: Endocrinology

## 2013-09-25 ENCOUNTER — Ambulatory Visit (INDEPENDENT_AMBULATORY_CARE_PROVIDER_SITE_OTHER): Payer: Medicare Other | Admitting: Endocrinology

## 2013-09-25 ENCOUNTER — Encounter: Payer: Self-pay | Admitting: Endocrinology

## 2013-09-25 VITALS — BP 128/62 | HR 72 | Temp 98.2°F | Resp 12 | Ht 67.0 in | Wt 240.8 lb

## 2013-09-25 DIAGNOSIS — E0590 Thyrotoxicosis, unspecified without thyrotoxic crisis or storm: Secondary | ICD-10-CM

## 2013-09-25 DIAGNOSIS — Z23 Encounter for immunization: Secondary | ICD-10-CM

## 2013-09-25 DIAGNOSIS — IMO0001 Reserved for inherently not codable concepts without codable children: Secondary | ICD-10-CM

## 2013-09-25 DIAGNOSIS — E1142 Type 2 diabetes mellitus with diabetic polyneuropathy: Secondary | ICD-10-CM

## 2013-09-25 NOTE — Progress Notes (Signed)
Patient ID: Brittney [name], female   DOB: Sep 27, 1946, 67 y.o.   MRN: [id number]  Brittney [name] is an 67 y.o. female.   Reason for Appointment:   History of Present Illness    Type 2 DIABETES MELITUS, date of diagnosis: 1976      She has had long-standing diabetes taking insulin. Because of her difficulties with compliance using mealtime insulin she was given in [place] in 2011 and subsequently Victoza in addition to her Lantus and this helped her control However she tends to be erratic with her day-to-day care and has not had adequate A1c levels in the past, usually over 7%  RECENT history: She has been told several times about the need for taking mealtime coverage but again did not understand her instructions and the rationale for taking NovoLog before eating regardless of the meal blood sugar  She has been taking her Victoza and Lantus but is frequently not taking her NovoLog, has taken it only once in the last couple of weeks before supper Has been previously told to check readings after meals to help adjust her NovoLog dose but does not do so except occasionally after breakfast or lunch Her fasting blood sugars have been somewhat more steady  Oral hypoglycemic drugs: Metformin        Side effects from medications: None Insulin regimen: Lantus 47 units daily in a.m., NovoLog 6-7 units before supper at times   Proper timing of medications in relation to meals: Yes.         Monitors blood glucose: Once a day.    Glucometer: Accucheck         Blood Glucose readings from meter download Before breakfast: 95-135 with average about 100. Late morning/p.c. breakfast 121-235 with only one high reading. Afternoon/suppertime 67-91 and p.c. supper 170 Overall average 108 with most readings in the morning  Hypoglycemia frequency:  has had 2 readings in the 60s at 2 PM or 4 PM        Meals: 3 meals per day. Dinner 7-8 pm Does not have protein with breakfast consistently           Physical activity:  exercise: Some walking             Wt Readings from Last 3 Encounters:  09/25/13 240 lb 12.8 oz (109.226 kg)  07/31/13 243 lb (110.224 kg)  07/15/13 238 lb (107.956 kg)   Lab Results  Component Value Date   HGBA1C 7.7* 07/31/2013   Lab Results  Component Value Date   LDLCALC 83 07/31/2013   CREATININE 0.9 07/31/2013    THYROID:   She has had a long-standing multinodular goiter since at least 2005 which has been fairly stable clinically She does have history of occasional palpitations for years but has been treated with metoprolol by her cardiologist for this No recent problems with atrial arrhythmias or tachycardia and she subjectively does not feel any unusual palpitations No shakiness or heat intolerance  Her TSH was checked by cardiologist and was low, no referred here for further management  No visits with results within 1 Week(s) from this visit. Latest known visit with results is:  Office Visit on 07/31/2013  Component Date Value Range Status  . T3, Free 07/31/2013 3.0  2.3 - 4.2 pg/mL Final  . Sodium 07/31/2013 140  135 - 145 mEq/L Final  . Potassium 07/31/2013 4.4  3.5 - 5.1 mEq/L Final  . Chloride 07/31/2013 108  96 - 112 mEq/L Final  . CO2  07/31/2013 27  19 - 32 mEq/L Final  . Glucose, Bld 07/31/2013 81  70 - 99 mg/dL Final  . BUN 09/81/1914 17  6 - 23 mg/dL Final  . Creatinine, Ser 07/31/2013 0.9  0.4 - 1.2 mg/dL Final  . Total Bilirubin 07/31/2013 0.3  0.3 - 1.2 mg/dL Final  . Alkaline Phosphatase 07/31/2013 81  39 - 117 U/L Final  . AST 07/31/2013 15  0 - 37 U/L Final  . ALT 07/31/2013 10  0 - 35 U/L Final  . Total Protein 07/31/2013 7.1  6.0 - 8.3 g/dL Final  . Albumin 78/29/5621 3.6  3.5 - 5.2 g/dL Final  . Calcium 30/86/5784 9.2  8.4 - 10.5 mg/dL Final  . GFR 69/62/9528 83.72  >60.00 mL/min Final  . Hemoglobin A1C 07/31/2013 7.7* 4.6 - 6.5 % Final   Glycemic Control Guidelines for People with Diabetes:Non Diabetic:  <6%Goal of Therapy: <7%Additional  Action Suggested:  >8%   . Cholesterol 07/31/2013 154  0 - 200 mg/dL Final   ATP III Classification       Desirable:  < 200 mg/dL               Borderline High:  200 - 239 mg/dL          High:  > = 413 mg/dL  . Triglycerides 07/31/2013 78.0  0.0 - 149.0 mg/dL Final   Normal:  <244 mg/dLBorderline High:  150 - 199 mg/dL  . HDL 07/31/2013 55.10  >39.00 mg/dL Final  . VLDL 11/23/7251 15.6  0.0 - 40.0 mg/dL Final  . LDL Cholesterol 07/31/2013 83  0 - 99 mg/dL Final  . Total CHOL/HDL Ratio 07/31/2013 3   Final                  Men          Women1/2 Average Risk     3.4          3.3Average Risk          5.0          4.42X Average Risk          9.6          7.13X Average Risk          15.0          11.0                          Medication List       This list is accurate as of: 09/25/13  9:55 AM.  Always use your most recent med list.               ALPRAZolam 0.25 MG tablet  Commonly known as:  XANAX  Take 0.25 mg by mouth at bedtime as needed.     amLODipine 10 MG tablet  Commonly known as:  NORVASC  Take 1 tablet (10 mg total) by mouth daily.     glucose blood test strip  Commonly known as:  ONE TOUCH ULTRA TEST  TEST 2 TIMES DAILY AS DIRECTED BY PHYSICIAN     insulin aspart 100 UNIT/ML injection  Commonly known as:  novoLOG  Inject into the skin. Sliding Scale     insulin glargine 100 UNIT/ML injection  Commonly known as:  LANTUS  Inject 47 Units into the skin daily.     metFORMIN 850 MG tablet  Commonly known as:  GLUCOPHAGE  Take 850 mg  by mouth 2 (two) times daily with a meal.     metoprolol 50 MG tablet  Commonly known as:  LOPRESSOR  TAKE 1 TABLET BY MOUTH TWICE DAILY.     omeprazole 20 MG capsule  Commonly known as:  PRILOSEC  Take 20 mg by mouth daily.     ONE TOUCH ULTRA SYSTEM KIT W/DEVICE Kit  1 kit by Does not apply route once.     onetouch ultrasoft lancets     polyethylene glycol powder powder  Commonly known as:  GLYCOLAX/MIRALAX     torsemide 20  MG tablet  Commonly known as:  DEMADEX  TAKE 1 TABLET BY MOUTH ONCE DAILY AS NEEDED.     valsartan 320 MG tablet  Commonly known as:  DIOVAN  Take 320 mg by mouth daily.     VICTOZA [place]  Inject into the skin. Inject 1.2 unit in skin dailly        Allergies: No Known Allergies  Past Medical History  Diagnosis Date  . Atrial fibrillation     Paroxysmal; LVH; nl EF; onset in 1999  . Hypertension   . Diabetes mellitus     insulin; managed by Dr. Lucianne Muss  . Hyperlipidemia   . Palpitations     negative event recorder in 2009  . Hypothyroidism     history of goiter; nl TSH off medication  . 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 Obesity 07/06/2012    Past Surgical History  Procedure Laterality Date  . Tubal ligation  1980's  . Cholecystectomy  11/95  . Retinal detachment surgery  2009  . Colonoscopy  2007    Family History  Problem Relation Age of Onset  . Diabetes Mother     Social History:  reports that she has quit smoking. She has never used smokeless tobacco. She reports that she does not drink alcohol or use illicit drugs.  Review of Systems:  HYPERTENSION:  this is very well controlled with current regimen  HYPERLIPIDEMIA: The lipid abnormality consists of elevated LDL; she has been on Pravachol but her compliance with this has been previously inconsistent, LDL was last improved at 83  Multinodular goiter: No evidence of hyperthyroidism on the last visit but only relatively low TSH     Examination:   BP 128/62  Pulse 72  Temp(Src) 98.2 F (36.8 C)  Resp 12  Ht 5\' 7"  (1.702 m)  Wt 240 lb 12.8 oz (109.226 kg)  BMI 37.71 kg/m2  SpO2 99%  Body mass index is 37.71 kg/(m^2).   Thyroid is enlarged especially in the midline and to the right,  No pedal edema  ASSESSMENT/ PLAN::   Diabetes type 2   The patient's diabetes control appears somewhat better as judged by her fasting readings but not clear what her readings are after  meals She did have a reading of 235 after eating a high carbohydrate breakfast and was 170 after supper about 4 weeks ago otherwise does not monitor after meals She does have a tendency to low normal readings in the afternoon with taking her Lantus in the morning She is not due for an A1c this was done 2 months ago but is generally higher because of postprandial hyperglycemia  She was explained in detail the duration of action and onset of NovoLog and the need for coming postprandial hyperglycemia which appears to be occurring whenever she checks her postprandial glucose She understands that she will try to take NovoLog  with supper daily unless eating a very light low carbohydrate meal and also with oatmeal in the morning She will try 4 units in the morning and 6 at supper She was checked readings after meals to help adjust the dose Have encouraged her to be active with regular exercise and continue efforts to lose weight  Also to avoid low normal readings in the afternoon we'll reduce her Lantus to 44 and check her A1c on the next visit She will continue Victoza unchanged She will review her day-to-day management in detail and blood sugars with nurse educator. Given her a record sheet to record her blood sugars, food eaten and insulin doses  Counseling time over 50% of today's 25 minute visit   Jenner Rosier 09/25/2013, 9:55 AM

## 2013-09-25 NOTE — Patient Instructions (Addendum)
Lantus 44 units daily,   NovoLog 6 units before supper DAILY  Take 4 Novolog at Bfst if eating oatmeal Protein in am daily  Please check blood sugars at least half the time about 2 hours after any meal and as directed on waking up.  Please bring blood sugar monitor to each visit

## 2013-09-26 ENCOUNTER — Telehealth: Payer: Self-pay | Admitting: Endocrinology

## 2013-09-26 ENCOUNTER — Other Ambulatory Visit: Payer: Self-pay | Admitting: *Deleted

## 2013-09-26 MED ORDER — INSULIN GLARGINE 100 UNIT/ML ~~LOC~~ SOLN: 47.0000 [IU] | Freq: Every day | SUBCUTANEOUS | Status: DC

## 2013-09-26 NOTE — Telephone Encounter (Signed)
rx sent

## 2013-10-07 ENCOUNTER — Encounter: Payer: Medicare Other | Admitting: Nutrition

## 2013-10-07 ENCOUNTER — Other Ambulatory Visit: Payer: Medicare Other

## 2013-10-11 ENCOUNTER — Other Ambulatory Visit (INDEPENDENT_AMBULATORY_CARE_PROVIDER_SITE_OTHER): Payer: Medicare Other

## 2013-10-11 DIAGNOSIS — E0590 Thyrotoxicosis, unspecified without thyrotoxic crisis or storm: Secondary | ICD-10-CM

## 2013-10-11 LAB — COMPREHENSIVE METABOLIC PANEL
ALT: 10 U/L (ref 0–35)
AST: 13 U/L (ref 0–37)
Calcium: 9 mg/dL (ref 8.4–10.5)
Chloride: 107 mEq/L (ref 96–112)
Creatinine, Ser: 0.9 mg/dL (ref 0.4–1.2)
Total Bilirubin: 0.5 mg/dL (ref 0.3–1.2)

## 2013-10-11 LAB — HEMOGLOBIN A1C: Hgb A1c MFr Bld: 7.5 % — ABNORMAL HIGH (ref 4.6–6.5)

## 2013-10-22 ENCOUNTER — Telehealth: Payer: Self-pay | Admitting: *Deleted

## 2013-10-22 NOTE — Telephone Encounter (Signed)
Her thyroid level and A1c are about the same as before, not clear why she had lab work without a followup right away. If she is not having a problem can wait till her January appointment to see her

## 2013-10-22 NOTE — Telephone Encounter (Signed)
Patient was calling for her lab results, please advise

## 2013-10-22 NOTE — Telephone Encounter (Signed)
Pt is aware, she said someone from the office called her and said she had a lab appt. That's why she came in.

## 2013-11-25 ENCOUNTER — Ambulatory Visit: Payer: Medicare Other | Admitting: Endocrinology

## 2013-11-27 ENCOUNTER — Ambulatory Visit (INDEPENDENT_AMBULATORY_CARE_PROVIDER_SITE_OTHER): Payer: Medicare Other | Admitting: Ophthalmology

## 2013-11-27 DIAGNOSIS — E1065 Type 1 diabetes mellitus with hyperglycemia: Secondary | ICD-10-CM

## 2013-11-27 DIAGNOSIS — H4310 Vitreous hemorrhage, unspecified eye: Secondary | ICD-10-CM

## 2013-11-27 DIAGNOSIS — H43819 Vitreous degeneration, unspecified eye: Secondary | ICD-10-CM

## 2013-11-27 DIAGNOSIS — E11359 Type 2 diabetes mellitus with proliferative diabetic retinopathy without macular edema: Secondary | ICD-10-CM

## 2013-11-27 DIAGNOSIS — I10 Essential (primary) hypertension: Secondary | ICD-10-CM

## 2013-11-27 DIAGNOSIS — H35039 Hypertensive retinopathy, unspecified eye: Secondary | ICD-10-CM

## 2013-11-27 DIAGNOSIS — E1039 Type 1 diabetes mellitus with other diabetic ophthalmic complication: Secondary | ICD-10-CM

## 2013-12-02 ENCOUNTER — Other Ambulatory Visit: Payer: Self-pay | Admitting: Endocrinology

## 2013-12-18 ENCOUNTER — Ambulatory Visit: Payer: Medicare Other | Admitting: Endocrinology

## 2013-12-25 ENCOUNTER — Other Ambulatory Visit: Payer: Self-pay | Admitting: Endocrinology

## 2013-12-26 ENCOUNTER — Ambulatory Visit: Payer: Medicare Other | Admitting: Endocrinology

## 2014-01-15 ENCOUNTER — Ambulatory Visit: Payer: Medicare Other | Admitting: Endocrinology

## 2014-01-23 ENCOUNTER — Telehealth: Payer: Self-pay | Admitting: *Deleted

## 2014-01-23 ENCOUNTER — Other Ambulatory Visit: Payer: Self-pay | Admitting: *Deleted

## 2014-01-23 MED ORDER — LIRAGLUTIDE 18 MG/3ML ~~LOC~~ SOPN: PEN_INJECTOR | SUBCUTANEOUS | Status: DC

## 2014-02-05 ENCOUNTER — Other Ambulatory Visit: Payer: Self-pay | Admitting: *Deleted

## 2014-02-05 ENCOUNTER — Encounter: Payer: Self-pay | Admitting: Endocrinology

## 2014-02-05 ENCOUNTER — Ambulatory Visit (INDEPENDENT_AMBULATORY_CARE_PROVIDER_SITE_OTHER): Payer: Medicare Other | Admitting: Endocrinology

## 2014-02-05 VITALS — BP 134/76 | HR 76 | Temp 97.9°F | Resp 16 | Ht 67.0 in | Wt 243.2 lb

## 2014-02-05 DIAGNOSIS — E1165 Type 2 diabetes mellitus with hyperglycemia: Principal | ICD-10-CM

## 2014-02-05 DIAGNOSIS — IMO0001 Reserved for inherently not codable concepts without codable children: Secondary | ICD-10-CM

## 2014-02-05 DIAGNOSIS — E040 Nontoxic diffuse goiter: Secondary | ICD-10-CM | POA: Insufficient documentation

## 2014-02-05 DIAGNOSIS — I10 Essential (primary) hypertension: Secondary | ICD-10-CM

## 2014-02-05 DIAGNOSIS — E049 Nontoxic goiter, unspecified: Secondary | ICD-10-CM

## 2014-02-05 DIAGNOSIS — E0590 Thyrotoxicosis, unspecified without thyrotoxic crisis or storm: Secondary | ICD-10-CM

## 2014-02-05 DIAGNOSIS — E785 Hyperlipidemia, unspecified: Secondary | ICD-10-CM

## 2014-02-05 NOTE — Progress Notes (Signed)
Patient ID: Brittney [name], female   DOB: 1946-04-22, 68 y.o.   MRN: [id number]   Reason for Appointment:   History of Present Illness    Type 2 DIABETES MELITUS, date of diagnosis: 1976      She has had long-standing diabetes taking insulin. Because of her difficulties with compliance using mealtime insulin she was given in [place] in 2011 and subsequently Victoza in addition to her Lantus and this helped her control However she tends to be erratic with her day-to-day care and has not had adequate A1c levels in the past, usually over 7%  RECENT history:  She has been told multiple times about the need for taking mealtime coverage but again did not understand her instructions and the rationale for taking NovoLog before eating regardless of the meal blood sugar She is again afraid of getting hypoglycemic at night However she is checking her blood sugars mostly before meals which are relatively good recently Occasional readings after meals have been higher in the afternoon or evening Fasting glucose readings are relatively lower than the supper time even though she is taking her Lantus in the morning Her morning blood sugars have been somewhat more normal recently with only occasional readings in the 70s and no overnight hypoglycemia; Lantus was reduced on her last visit because of occasional daytime hypoglycemia Postprandial glucose was 198 in her lab and occasionally over 180   She has been taking her Victoza and Lantus consistently  Oral hypoglycemic drugs: Metformin        Side effects from medications: None Insulin regimen: Lantus 44 units daily in a.m., NovoLog 6-7 units before supper if glucose is high        Monitors blood glucose: Once a day.    Glucometer: Accucheck         Blood Glucose readings from meter download  PREMEAL Breakfast Lunch afternoon   late p.m.  Overall  Glucose range:  73-161   108, 164   213   85-184    Mean/median:  120      125    Hypoglycemia frequency:   none recently         Meals: 2-3 meals per day. Usually light or no lunch. Dinner 7-8 pm Does not have protein with breakfast consistently           Physical activity: exercise: Start ed walking             Wt Readings from Last 3 Encounters:  02/05/14 243 lb 3.2 oz (110.315 kg)  09/25/13 240 lb 12.8 oz (109.226 kg)  07/31/13 243 lb (110.224 kg)   HbA1c on 12/30/13 was 7.2  Lab Results  Component Value Date   HGBA1C 7.5* 10/11/2013   HGBA1C 7.7* 07/31/2013   Lab Results  Component Value Date   LDLCALC 83 07/31/2013   CREATININE 0.9 10/11/2013       No visits with results within 1 Week(s) from this visit. Latest known visit with results is:  Appointment on 10/11/2013  Component Date Value Ref Range Status  . Hemoglobin A1C 10/11/2013 7.5* 4.6 - 6.5 % Final   Glycemic Control Guidelines for People with Diabetes:Non Diabetic:  <6%Goal of Therapy: <7%Additional Action Suggested:  >8%   . Sodium 10/11/2013 137  135 - 145 mEq/L Final  . Potassium 10/11/2013 4.6  3.5 - 5.1 mEq/L Final  . Chloride 10/11/2013 107  96 - 112 mEq/L Final  . CO2 10/11/2013 24  19 - 32 mEq/L Final  .  Glucose, Bld 10/11/2013 137* 70 - 99 mg/dL Final  . BUN 10/11/2013 15  6 - 23 mg/dL Final  . Creatinine, Ser 10/11/2013 0.9  0.4 - 1.2 mg/dL Final  . Total Bilirubin 10/11/2013 0.5  0.3 - 1.2 mg/dL Final  . Alkaline Phosphatase 10/11/2013 83  39 - 117 U/L Final  . AST 10/11/2013 13  0 - 37 U/L Final  . ALT 10/11/2013 10  0 - 35 U/L Final  . Total Protein 10/11/2013 6.6  6.0 - 8.3 g/dL Final  . Albumin 10/11/2013 3.5  3.5 - 5.2 g/dL Final  . Calcium 10/11/2013 9.0  8.4 - 10.5 mg/dL Final  . GFR 10/11/2013 82.57  >60.00 mL/min Final  . TSH 10/11/2013 0.06* 0.35 - 5.50 uIU/mL Final  . Free T4 10/11/2013 1.03  0.60 - 1.60 ng/dL Final      Medication List       This list is accurate as of: 02/05/14 10:26 AM.  Always use your most recent med list.               ALPRAZolam 0.25 MG tablet  Commonly  known as:  XANAX  Take 0.25 mg by mouth at bedtime as needed.     amLODipine 10 MG tablet  Commonly known as:  NORVASC  Take 1 tablet (10 mg total) by mouth daily.     glucose blood test strip  Commonly known as:  ONE TOUCH ULTRA TEST  TEST 2 TIMES DAILY AS DIRECTED BY PHYSICIAN     insulin aspart 100 UNIT/ML injection  Commonly known as:  novoLOG  Inject 6-7 Units into the skin. Sliding Scale     insulin glargine 100 UNIT/ML injection  Commonly known as:  LANTUS  Inject 44 Units into the skin daily.     metFORMIN 850 MG tablet  Commonly known as:  GLUCOPHAGE  TAKE (1) TABLET BY MOUTH TWICE DAILY.     metoprolol 50 MG tablet  Commonly known as:  LOPRESSOR  TAKE 1 TABLET BY MOUTH TWICE DAILY.     omeprazole 20 MG capsule  Commonly known as:  PRILOSEC  Take 20 mg by mouth daily.     ONE TOUCH ULTRA SYSTEM KIT W/DEVICE Kit  1 kit by Does not apply route once.     onetouch ultrasoft lancets     polyethylene glycol powder powder  Commonly known as:  GLYCOLAX/MIRALAX     prednisoLONE acetate 1 % ophthalmic suspension  Commonly known as:  PRED FORTE     torsemide 20 MG tablet  Commonly known as:  DEMADEX  TAKE 1 TABLET BY MOUTH ONCE DAILY AS NEEDED.     valsartan 320 MG tablet  Commonly known as:  DIOVAN  Take 320 mg by mouth daily.     VICTOZA [place]  Inject into the skin. Inject 1.2 unit in skin dailly     Liraglutide 18 MG/3ML Sopn  Commonly known as:  VICTOZA  Inject 1.2 mg daily        Allergies: No Known Allergies  Past Medical History  Diagnosis Date  . Atrial fibrillation     Paroxysmal; LVH; nl EF; onset in 1999  . Hypertension   . Diabetes mellitus     insulin; managed by Dr. Dwyane Dee  . Hyperlipidemia   . Palpitations     negative event recorder in 2009  . Hypothyroidism     history of goiter; nl TSH off medication  . Diabetic Charcot's joint disease     left  lower extremity  . Obesity 07/06/2012    Past Surgical History  Procedure  Laterality Date  . Tubal ligation  1980's  . Cholecystectomy  11/95  . Retinal detachment surgery  2009  . Colonoscopy  2007    Family History  Problem Relation Age of Onset  . Diabetes Mother     Social History:  reports that she has quit smoking. She has never used smokeless tobacco. She reports that she does not drink alcohol or use illicit drugs.  Review of Systems:  HYPERTENSION:  this is very well controlled with current regimen  HYPERLIPIDEMIA: The lipid abnormality consists of elevated LDL; she has been on Pravachol but her compliance with this has been previously inconsistent, LDL   Recent LDL from PCP was 99  Lab Results  Component Value Date   CHOL 154 07/31/2013   HDL 55.10 07/31/2013   LDLCALC 83 07/31/2013   TRIG 78.0 07/31/2013   CHOLHDL 3 07/31/2013    Multinodular goiter: She has had a long-standing multinodular goiter since at least 2005 which has been fairly stable clinically She does have history of occasional palpitations for years but has been treated with metoprolol by her cardiologist for this Her goiter has been autonomous. No local pressure symptoms of choking or difficulty swallowing She does complain of occasional palpitations but no heat intolerance  No evidence of hyperthyroidism previously with normal free T4 and free T3 levels; TSH continues to be suppressed Her I-131 uptake in 2003 was 17% and her recent thyroid ultrasound did not show distinct nodules but a relative increased in size  Lab Results  Component Value Date   TSH 0.06* 10/11/2013   TSH 0.016* 07/15/2013   FREET4 1.03 10/11/2013   FREET4 1.56 07/15/2013        Examination:   BP 134/76  Pulse 76  Temp(Src) 97.9 F (36.6 C)  Resp 16  Ht _0  (1.702 m)  Wt 243 lb 3.2 oz (110.315 kg)  BMI 38.08 kg/m2  SpO2 97%  Body mass index is 38.08 kg/(m^2).   Thyroid is enlarged about 4 times normal on the right lobe extending to the isthmus, relatively smooth and mildly firm. Left  side is enlarged about twice normal. Pemberton sign is negative and no stridor Biceps reflexes are difficult to elicit No pedal edema  ASSESSMENT/ PLAN::   Diabetes type 2   The patient's diabetes control appears somewhat better as judged by her fasting readings but not clear how often she has postprandial hyperglycemia Although her A1c was reasonably good last month at 7.2 she tends to have periodic readings over 180 after meals Again checking blood sugar primarily early in the mornings or before breakfast  Most likely does have need for mealtime insulin at her main meal even with taking Victoza and Lantus She has been consistently fearful of hypoglycemia with taking NovoLog and we had another discussion about this as before  She was explained in detail the duration of action and onset of NovoLog and the need for coming postprandial hyperglycemia which appears to be occurring with her evening meal or sometimes lunch or breakfast  She understands that she will try to take NovoLog 6 units with supper daily unless eating a very light low carbohydrate meal and also with oatmeal in the morning She will try 4 units in the morning and 6 at supper She  will start taking significantly more after meals to help adjust the doses Have encouraged her to  have  regular exercise  with walking and  consistent diet  MULTINODULAR goiter: Although the size is somewhat bigger she is asymptomatic and not clear if she is having subclinical hyperthyroidism. TSH is persistently low and may consider doing an I-131 uptake if her free T4 and free T3 are upper normal  Counseling time over 50% of today's 25 minute visit   Brittney [name] 02/05/2014, 10:26 AM

## 2014-02-05 NOTE — Patient Instructions (Addendum)
Please check blood sugars at least half the time about 2 hours after any meal and 3x per week on waking up. Please bring blood sugar monitor to each visit  Take Novolog 4-6 units before your main meal  Lantus 42 units and if am sugar is staying below 100 then reduce to 40

## 2014-02-06 ENCOUNTER — Other Ambulatory Visit: Payer: Self-pay | Admitting: Endocrinology

## 2014-02-07 LAB — T4, FREE: Free T4: 1.06 ng/dL (ref 0.80–1.80)

## 2014-02-07 LAB — T3, FREE: T3, Free: 3 pg/mL (ref 2.3–4.2)

## 2014-02-07 LAB — TSH: TSH: 0.088 u[IU]/mL — AB (ref 0.350–4.500)

## 2014-02-09 NOTE — Progress Notes (Signed)
Quick Note:  Please let patient know that the thyroid is unchanged, no treatment at this time ______

## 2014-03-18 ENCOUNTER — Other Ambulatory Visit: Payer: Self-pay | Admitting: *Deleted

## 2014-03-18 MED ORDER — INSULIN GLARGINE 100 UNIT/ML SOLOSTAR PEN: PEN_INJECTOR | SUBCUTANEOUS | Status: DC

## 2014-03-18 MED ORDER — INSULIN ASPART 100 UNIT/ML FLEXPEN: PEN_INJECTOR | SUBCUTANEOUS | Status: DC

## 2014-03-28 ENCOUNTER — Ambulatory Visit (INDEPENDENT_AMBULATORY_CARE_PROVIDER_SITE_OTHER): Payer: Medicare Other | Admitting: Ophthalmology

## 2014-04-09 ENCOUNTER — Ambulatory Visit (INDEPENDENT_AMBULATORY_CARE_PROVIDER_SITE_OTHER): Payer: PRIVATE HEALTH INSURANCE | Admitting: Adult Health

## 2014-04-09 ENCOUNTER — Encounter: Payer: Self-pay | Admitting: Adult Health

## 2014-04-09 ENCOUNTER — Other Ambulatory Visit (HOSPITAL_COMMUNITY)
Admission: RE | Admit: 2014-04-09 | Discharge: 2014-04-09 | Disposition: A | Discharge status: Home / Self Care | Payer: Medicare Other | Source: Ambulatory Visit | Attending: Adult Health | Admitting: Adult Health

## 2014-04-09 VITALS — BP 148/60 | HR 76 | Ht 67.0 in | Wt 242.5 lb

## 2014-04-09 DIAGNOSIS — Z01419 Encounter for gynecological examination (general) (routine) without abnormal findings: Secondary | ICD-10-CM

## 2014-04-09 DIAGNOSIS — Z1212 Encounter for screening for malignant neoplasm of rectum: Secondary | ICD-10-CM

## 2014-04-09 DIAGNOSIS — Z124 Encounter for screening for malignant neoplasm of cervix: Secondary | ICD-10-CM | POA: Insufficient documentation

## 2014-04-09 DIAGNOSIS — Z1151 Encounter for screening for human papillomavirus (HPV): Secondary | ICD-10-CM | POA: Insufficient documentation

## 2014-04-09 LAB — HEMOCCULT GUIAC POC 1CARD (OFFICE): Fecal Occult Blood, POC: NEGATIVE

## 2014-04-09 NOTE — Patient Instructions (Signed)
Physical in 2 year Mammogram yearly Labs with PCP  

## 2014-04-09 NOTE — Progress Notes (Signed)
Patient ID: Brittney [name], female   DOB: 10/02/1946, 68 y.o.   MRN: [id number] History of Present Illness: Brittney [name] is a 68 year old black female in for a pap and physical.   Current Medications, Allergies, Past Medical History, Past Surgical History, Family History and Social History were reviewed in [facility] record.     Review of Systems: Patient denies any headaches, blurred vision, shortness of breath, chest pain, abdominal pain, problems with bowel movements, urination, or intercourse. No joint swelling or mood swings, sees Dr Dwyane Dee and Dr Berdine Addison.She tells me she has 13 grandchildren and 1 great grand child.    Physical Exam:BP 148/60  Pulse 76  Ht 5\' 7"  (1.702 m)  Wt 242 lb 8 oz (109.997 kg)  BMI 37.97 kg/m2 General:  Well developed, well nourished, no acute distress Skin:  Warm and dry Neck:  Midline trachea,  Thyroid enlarged right > left, known goiter Lungs; Clear to auscultation bilaterally Breast:  No dominant palpable mass, retraction, or nipple discharge, has sebaceous cyst near right breast on abdomen that when pressure applied, cheesy material expressed. Cardiovascular: Regular rate and rhythm Abdomen:  Soft, non tender, no hepatosplenomegaly Pelvic:  External genitalia is normal in appearance.  The vagina is normal in appearance for age with loss of color and rugae.The cervix is smooth.Pap performed with HPV.  Uterus is felt to be normal size, shape, and contour.  No adnexal masses or tenderness noted. Rectal: Good sphincter tone, no polyps, or hemorrhoids felt.  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 Extremities:  No swelling or varicosities noted Psych:  No mood changes, alert and cooperative, seems happy   Impression: Yearly gyn exam    Plan: Physical in 2 years Mammogram yearly Labs with PCP Colonoscopy per Dr Laural Golden

## 2014-04-11 ENCOUNTER — Other Ambulatory Visit: Payer: Self-pay | Admitting: Endocrinology

## 2014-04-16 ENCOUNTER — Ambulatory Visit (INDEPENDENT_AMBULATORY_CARE_PROVIDER_SITE_OTHER): Payer: PRIVATE HEALTH INSURANCE | Admitting: Ophthalmology

## 2014-04-16 DIAGNOSIS — H35379 Puckering of macula, unspecified eye: Secondary | ICD-10-CM

## 2014-04-16 DIAGNOSIS — E1139 Type 2 diabetes mellitus with other diabetic ophthalmic complication: Secondary | ICD-10-CM

## 2014-04-16 DIAGNOSIS — E1165 Type 2 diabetes mellitus with hyperglycemia: Secondary | ICD-10-CM

## 2014-04-16 DIAGNOSIS — H35039 Hypertensive retinopathy, unspecified eye: Secondary | ICD-10-CM

## 2014-04-16 DIAGNOSIS — I10 Essential (primary) hypertension: Secondary | ICD-10-CM

## 2014-04-16 DIAGNOSIS — E11359 Type 2 diabetes mellitus with proliferative diabetic retinopathy without macular edema: Secondary | ICD-10-CM

## 2014-04-16 DIAGNOSIS — H43819 Vitreous degeneration, unspecified eye: Secondary | ICD-10-CM

## 2014-04-18 ENCOUNTER — Telehealth: Payer: Self-pay | Admitting: Cardiovascular Disease

## 2014-04-18 MED ORDER — AMLODIPINE BESYLATE 10 MG PO TABS: 10.0000 mg | ORAL_TABLET | Freq: Every day | ORAL | Status: DC

## 2014-04-18 NOTE — Telephone Encounter (Signed)
Received fax refill request  Rx # [id number] Medication:  Amlodipine Besylate 10mg  Qty 30 Sig:  Take one tablet by mouth once daily Physician:  Bronson Ing

## 2014-04-18 NOTE — Telephone Encounter (Signed)
Medication sent via escribe.  

## 2014-05-20 ENCOUNTER — Other Ambulatory Visit: Payer: Self-pay | Admitting: *Deleted

## 2014-05-20 ENCOUNTER — Other Ambulatory Visit: Payer: Self-pay | Admitting: Cardiology

## 2014-05-20 MED ORDER — LIRAGLUTIDE 18 MG/3ML ~~LOC~~ SOPN: PEN_INJECTOR | SUBCUTANEOUS | Status: DC

## 2014-05-22 ENCOUNTER — Telehealth: Payer: Self-pay | Admitting: Endocrinology

## 2014-05-22 ENCOUNTER — Ambulatory Visit: Payer: Medicare Other | Admitting: Endocrinology

## 2014-05-22 NOTE — Telephone Encounter (Signed)
Please change her appointment time if that's what she wants.

## 2014-05-22 NOTE — Telephone Encounter (Signed)
Patient returning call concerning, changing appointment time.

## 2014-05-26 ENCOUNTER — Other Ambulatory Visit: Payer: Medicare Other

## 2014-05-27 ENCOUNTER — Other Ambulatory Visit: Payer: Self-pay | Admitting: Endocrinology

## 2014-05-27 LAB — COMPREHENSIVE METABOLIC PANEL
ALK PHOS: 95 U/L (ref 39–117)
ALT: 11 U/L (ref 0–35)
AST: 13 U/L (ref 0–37)
Albumin: 4 g/dL (ref 3.5–5.2)
BILIRUBIN TOTAL: 0.5 mg/dL (ref 0.2–1.2)
BUN: 17 mg/dL (ref 6–23)
CO2: 28 mEq/L (ref 19–32)
CREATININE: 0.99 mg/dL (ref 0.50–1.10)
Calcium: 9.6 mg/dL (ref 8.4–10.5)
Chloride: 103 mEq/L (ref 96–112)
GLUCOSE: 126 mg/dL — AB (ref 70–99)
Potassium: 5 mEq/L (ref 3.5–5.3)
SODIUM: 138 meq/L (ref 135–145)
TOTAL PROTEIN: 6.9 g/dL (ref 6.0–8.3)

## 2014-05-27 LAB — LIPID PANEL
CHOL/HDL RATIO: 3.1 ratio
Cholesterol: 192 mg/dL (ref 0–200)
HDL: 61 mg/dL (ref >39)
LDL CALC: 112 mg/dL — AB (ref 0–99)
TRIGLYCERIDES: 95 mg/dL (ref <150)
VLDL: 19 mg/dL (ref 0–40)

## 2014-05-27 LAB — HEMOGLOBIN A1C
HEMOGLOBIN A1C: 8.4 % — AB (ref <5.7)
Mean Plasma Glucose: 194 mg/dL — ABNORMAL HIGH (ref <117)

## 2014-05-28 ENCOUNTER — Ambulatory Visit (INDEPENDENT_AMBULATORY_CARE_PROVIDER_SITE_OTHER): Payer: Medicare Other | Admitting: Endocrinology

## 2014-05-28 ENCOUNTER — Encounter: Payer: Self-pay | Admitting: Endocrinology

## 2014-05-28 ENCOUNTER — Other Ambulatory Visit: Payer: Self-pay | Admitting: *Deleted

## 2014-05-28 VITALS — BP 157/85 | HR 78 | Temp 98.3°F | Resp 14 | Ht 67.0 in | Wt 245.0 lb

## 2014-05-28 DIAGNOSIS — E040 Nontoxic diffuse goiter: Secondary | ICD-10-CM

## 2014-05-28 DIAGNOSIS — E785 Hyperlipidemia, unspecified: Secondary | ICD-10-CM

## 2014-05-28 DIAGNOSIS — E049 Nontoxic goiter, unspecified: Secondary | ICD-10-CM

## 2014-05-28 DIAGNOSIS — I10 Essential (primary) hypertension: Secondary | ICD-10-CM

## 2014-05-28 DIAGNOSIS — E1165 Type 2 diabetes mellitus with hyperglycemia: Principal | ICD-10-CM

## 2014-05-28 DIAGNOSIS — IMO0001 Reserved for inherently not codable concepts without codable children: Secondary | ICD-10-CM

## 2014-05-28 DIAGNOSIS — E0590 Thyrotoxicosis, unspecified without thyrotoxic crisis or storm: Secondary | ICD-10-CM

## 2014-05-28 LAB — T4, FREE: Free T4: 1.19 ng/dL (ref 0.80–1.80)

## 2014-05-28 LAB — T3, FREE: T3, Free: 1.8 pg/mL — ABNORMAL LOW (ref 2.3–4.2)

## 2014-05-28 LAB — TSH: TSH: 0.026 u[IU]/mL — ABNORMAL LOW (ref 0.350–4.500)

## 2014-05-28 MED ORDER — CANAGLIFLOZIN 100 MG PO TABS: 100.0000 mg | ORAL_TABLET | Freq: Every day | ORAL | Status: DC

## 2014-05-28 MED ORDER — PRAVASTATIN SODIUM 20 MG PO TABS: 20.0000 mg | ORAL_TABLET | Freq: Every day | ORAL | Status: DC

## 2014-05-28 MED ORDER — LIRAGLUTIDE 18 MG/3ML ~~LOC~~ SOPN: PEN_INJECTOR | SUBCUTANEOUS | Status: DC

## 2014-05-28 NOTE — Progress Notes (Signed)
Patient ID: Brittney [name], female   DOB: [birth year], 68 y.o.   MRN: [id number]   Reason for Appointment:   History of Present Illness    Type 2 DIABETES MELITUS, date of diagnosis: 1976      She has had long-standing diabetes taking insulin. Because of her difficulties with compliance using mealtime insulin she was given in [place] in 2011 and subsequently Victoza in addition to her Lantus and this helped her control However she tends to be erratic with her day-to-day care and has not had adequate A1c levels in the past, usually over 7%  RECENT history:  Her blood sugars appear to be worse since her last visit because of poor compliance with her insulin as well as less consistent diet and some weight gain She still does not understand the importance of taking NovoLog before meals to keep her from having postprandial hyperglycemia. This has been explained to her on each visit but she did not follow instructions despite giving them in writing She again thinks that she will take the NovoLog only when the blood sugar is high; frequently not taking the insulin with her evening meal; also if she takes NovoLog in the morning she will take it 30 minutes after eating Fasting glucose readings are quite variable and overall higher than the last time even with the same dose of Lantus Postprandial glucose  has not been checked except on 2 occasions, once high after lunch and another time 312 after supper  She has been taking her Victoza and Lantus consistently Recently has been starting to walk  Oral hypoglycemic drugs: Metformin        Side effects from medications: None Insulin regimen: Lantus 44 units daily in a.m., NovoLog 6-7 units before supper if glucose is high        Monitors blood glucose: Once a day.    Glucometer: Accucheck         Blood Glucose readings from meter download: Checking blood sugars mostly in the mornings  PREMEAL Breakfast Lunch Dinner Bedtime Overall  Glucose range:  112-191     185     Mean/median:  145      152    POST-MEAL PC Breakfast PC Lunch PC Dinner  Glucose range:   208   161, 312   Mean/median:      Hypoglycemia: None        Meals: 2-3 meals per day. Usually light or no lunch. Dinner 7-8 pm Does not have protein with breakfast consistently           Physical activity: exercise: Started walking 45-60 min, 3-5/7            Wt Readings from Last 3 Encounters:  05/28/14 245 lb (111.131 kg)  04/09/14 242 lb 8 oz (109.997 kg)  02/05/14 243 lb 3.2 oz (110.315 kg)   HbA1c on 12/30/13 was 7.2  Lab Results  Component Value Date   HGBA1C 8.4* 05/27/2014   HGBA1C 7.5* 10/11/2013   HGBA1C 7.7* 07/31/2013   Lab Results  Component Value Date   LDLCALC 112* 05/27/2014   CREATININE 0.99 05/27/2014       Orders Only on 05/27/2014  Component Date Value Ref Range Status  . Sodium 05/27/2014 138  135 - 145 mEq/L Final  . Potassium 05/27/2014 5.0  3.5 - 5.3 mEq/L Final  . Chloride 05/27/2014 103  96 - 112 mEq/L Final  . CO2 05/27/2014 28  19 - 32 mEq/L Final  . Glucose, Bld  05/27/2014 126* 70 - 99 mg/dL Final  . BUN 05/27/2014 17  6 - 23 mg/dL Final  . Creat 05/27/2014 0.99  0.50 - 1.10 mg/dL Final  . Total Bilirubin 05/27/2014 0.5  0.2 - 1.2 mg/dL Final  . Alkaline Phosphatase 05/27/2014 95  39 - 117 U/L Final  . AST 05/27/2014 13  0 - 37 U/L Final  . ALT 05/27/2014 11  0 - 35 U/L Final  . Total Protein 05/27/2014 6.9  6.0 - 8.3 g/dL Final  . Albumin 05/27/2014 4.0  3.5 - 5.2 g/dL Final  . Calcium 05/27/2014 9.6  8.4 - 10.5 mg/dL Final  . Cholesterol 05/27/2014 192  0 - 200 mg/dL Final   Comment: ATP III Classification:                                < 200        mg/dL        Desirable                               200 - 239     mg/dL        Borderline High                               >= 240        mg/dL        High                             . Triglycerides 05/27/2014 95  <150 mg/dL Final  . HDL 05/27/2014 61  >39 mg/dL Final  . Total CHOL/HDL  Ratio 05/27/2014 3.1   Final  . VLDL 05/27/2014 19  0 - 40 mg/dL Final  . LDL Cholesterol 05/27/2014 112* 0 - 99 mg/dL Final   Comment:                            Total Cholesterol/HDL Ratio:CHD Risk                                                 Coronary Heart Disease Risk Table                                                                 Men       Women                                   1/2 Average Risk              3.4        3.3  Average Risk              5.0        4.4                                    2X Average Risk              9.6        7.1                                    3X Average Risk             23.4       11.0                          Use the calculated Patient Ratio above and the CHD Risk table                           to determine the patient's CHD Risk.                          ATP III Classification (LDL):                                < 100        mg/dL         Optimal                               100 - 129     mg/dL         Near or Above Optimal                               130 - 159     mg/dL         Borderline High                               160 - 189     mg/dL         High                                > 190        mg/dL         Very High                             . TSH 05/27/2014 0.026* 0.350 - 4.500 uIU/mL Final   Comment: Result repeated and verified.                          Result confirmed by automatic dilution.  . Free T4 05/27/2014 1.19  0.80 - 1.80 ng/dL Final  . Hemoglobin A1C 05/27/2014 8.4* <5.7 % Final   Comment:  According to the ADA Clinical Practice Recommendations for 2011, when                          HbA1c is used as a screening test:                                                       >=6.5%   Diagnostic of Diabetes Mellitus                                     (if abnormal result is confirmed)                                                      5.7-6.4%   Increased risk of developing Diabetes Mellitus                                                     References:Diagnosis and Classification of Diabetes Mellitus,Diabetes                          [id number],1696,78(LFYBO 1):S62-S69 and Standards of Medical Care in                                  Diabetes - 2011,Diabetes [id number],0258,52 (Suppl 1):S11-S61.                             . Mean Plasma Glucose 05/27/2014 194* <117 mg/dL Final  . T3, Free 05/27/2014 1.8* 2.3 - 4.2 pg/mL Final      Medication List       This list is accurate as of: 05/28/14  3:49 PM.  Always use your most recent med list.               ACCU-CHEK AVIVA PLUS test strip  Generic drug:  glucose blood  USE TO TEST TWICE DAILY.     ALPRAZolam 0.25 MG tablet  Commonly known as:  XANAX  Take 0.25 mg by mouth at bedtime as needed.     amLODipine 10 MG tablet  Commonly known as:  NORVASC  Take 1 tablet (10 mg total) by mouth daily.     insulin aspart 100 UNIT/ML FlexPen  Commonly known as:  NOVOLOG FLEXPEN  Inject 6-7 units before supper if glucose is high     Insulin Glargine 100 UNIT/ML Solostar Pen  Commonly known as:  LANTUS SOLOSTAR  Inject 44 units daily     Liraglutide 18 MG/3ML Sopn  Commonly known as:  VICTOZA  Inject 1.2 mg daily     metFORMIN 850 MG tablet  Commonly known as:  GLUCOPHAGE  TAKE (1) TABLET BY MOUTH TWICE DAILY.     metoprolol 50 MG tablet  Commonly known as:  LOPRESSOR  TAKE 1  TABLET BY MOUTH TWICE DAILY.     omeprazole 20 MG capsule  Commonly known as:  PRILOSEC  Take 20 mg by mouth daily.     ONE TOUCH ULTRA SYSTEM KIT W/DEVICE Kit  1 kit by Does not apply route once.     onetouch ultrasoft lancets     polyethylene glycol powder powder  Commonly known as:  GLYCOLAX/MIRALAX  Take by mouth as needed.     prednisoLONE acetate 1 % ophthalmic suspension  Commonly known as:  PRED FORTE     torsemide 20 MG  tablet  Commonly known as:  DEMADEX  TAKE 1 TABLET BY MOUTH ONCE DAILY AS NEEDED.     valsartan 320 MG tablet  Commonly known as:  DIOVAN  Take 320 mg by mouth daily.        Allergies: No Known Allergies  Past Medical History  Diagnosis Date  . Atrial fibrillation     Paroxysmal; LVH; nl EF; onset in 1999  . Hypertension   . Diabetes mellitus     insulin; managed by Dr. Dwyane Dee  . Hyperlipidemia   . Palpitations     negative event recorder in 2009  . Hypothyroidism     history of goiter; nl TSH off medication  . 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 Obesity 07/06/2012    Past Surgical History  Procedure Laterality Date  . Tubal ligation  1980's  . Cholecystectomy  11/95  . Retinal detachment surgery  2009  . Colonoscopy  2007  . Cesarean section    . Biopsy thyroid      Family History  Problem Relation Age of Onset  . Diabetes Mother   . Diabetes Daughter     gestational diabetes    Social History:  reports that she has quit smoking. She has never used smokeless tobacco. She reports that she does not drink alcohol or use illicit drugs.  Review of Systems:  HYPERTENSION:  this is very well controlled with current regimen  HYPERLIPIDEMIA: The lipid abnormality consists of elevated LDL; she has been on Pravachol but her compliance with this has been previously inconsistent. Now she said that she does not have the prescription for it   Lab Results  Component Value Date   CHOL 192 05/27/2014   HDL 61 05/27/2014   LDLCALC 112* 05/27/2014   TRIG 95 05/27/2014   CHOLHDL 3.1 05/27/2014    Multinodular goiter: She has had a long-standing multinodular goiter since at least 2005 which has been fairly stable clinically She does have history of occasional palpitations for years but has been treated with metoprolol by her cardiologist for this Her goiter has been autonomous. No local pressure symptoms of choking or difficulty swallowing She does complain  of occasional palpitations but no heat intolerance  No evidence of hyperthyroidism  with normal free T4 and free T3 levels; TSH continues to be suppressed Her I-131 uptake in 2003 was 17% and her recent thyroid ultrasound did not show distinct nodules but a relative increased in size  Lab Results  Component Value Date   TSH 0.026* 05/27/2014   TSH 0.088* 02/06/2014   FREET4 1.19 05/27/2014   FREET4 1.06 02/06/2014        Examination:   BP 157/85  Pulse 78  Temp(Src) 98.3 F (36.8 C)  Resp 14  Ht 5' 7"  (1.702 m)  Wt 245 lb (111.131 kg)  BMI 38.36 kg/m2  SpO2 96%  Body mass index  is 38.36 kg/(m^2).    No pedal edema  ASSESSMENT/ PLAN:  Diabetes Type 2   The patient's diabetes control is somewhat worse with A1c about 1% higher Not clear this is mostly related to inconsistent diet or other factors She is still reluctant to take mealtime insulin despite repeated instructions on how and when to do this Again checking blood sugars mostly in the morning and not understanding that she has significant postprandial hyperglycemia  Instead of trying to instruct her on mealtime insulin will have her increase her Victoza to 1.8 mg which should help her with portion control and better postprandial readings Also will give her a trial of Invokana: Discussed mechanism of action, actions on glucose, weight loss, fluid balance and possible side effects With the new regimen she will probably need less insulin and will reduce her Lantus by 4 units for now She can also leave off her diuretic    Patient Instructions  Please check blood sugars at least half the time about 2 hours after any meal and times per week on waking up. Please bring blood sugar monitor to each visit  Take NOVOLOG 4-6 UNITS WITH every meal unless eating a light meal  Lantus 40 units  Invokana in am   Stop Torsemide  Victoza 1.59m daily    MULTINODULAR goiter: Still autonomous without increase in free T4 and free  T3 Will check this periodically   Counseling time over 50% of today's 25 minute visit   Hollyanne Schloesser 05/28/2014, 3:49 PM

## 2014-05-28 NOTE — Patient Instructions (Addendum)
Please check blood sugars at least half the time about 2 hours after any meal and times per week on waking up. Please bring blood sugar monitor to each visit  Take NOVOLOG 4-6 UNITS WITH every meal unless eating a light meal  Lantus 40 units  Invokana in am   Stop Torsemide  Victoza 1.8mg  daily

## 2014-06-20 ENCOUNTER — Other Ambulatory Visit: Payer: Self-pay | Admitting: Endocrinology

## 2014-06-30 ENCOUNTER — Other Ambulatory Visit (HOSPITAL_COMMUNITY): Payer: Self-pay | Admitting: Family Medicine

## 2014-06-30 DIAGNOSIS — Z1231 Encounter for screening mammogram for malignant neoplasm of breast: Secondary | ICD-10-CM

## 2014-07-08 ENCOUNTER — Ambulatory Visit (INDEPENDENT_AMBULATORY_CARE_PROVIDER_SITE_OTHER): Payer: Medicare Other | Admitting: Endocrinology

## 2014-07-08 ENCOUNTER — Encounter: Payer: Self-pay | Admitting: Endocrinology

## 2014-07-08 VITALS — BP 134/63 | HR 76 | Temp 98.4°F | Resp 16 | Ht 67.0 in | Wt 243.2 lb

## 2014-07-08 DIAGNOSIS — E040 Nontoxic diffuse goiter: Secondary | ICD-10-CM

## 2014-07-08 DIAGNOSIS — E049 Nontoxic goiter, unspecified: Secondary | ICD-10-CM

## 2014-07-08 DIAGNOSIS — IMO0001 Reserved for inherently not codable concepts without codable children: Secondary | ICD-10-CM

## 2014-07-08 DIAGNOSIS — E1165 Type 2 diabetes mellitus with hyperglycemia: Principal | ICD-10-CM

## 2014-07-08 DIAGNOSIS — E785 Hyperlipidemia, unspecified: Secondary | ICD-10-CM

## 2014-07-08 NOTE — Progress Notes (Signed)
Patient ID: Brittney [name], female   DOB: 12/26/1945, 68 y.o.   MRN: [id number]   Reason for Appointment:   History of Present Illness    Type 2 DIABETES MELITUS, date of diagnosis: 1976      She has had long-standing diabetes taking insulin. Because of her difficulties with compliance using mealtime insulin she was given in [place] in 2011 and subsequently Victoza in addition to her Lantus and this helped her control However she tends to be erratic with her day-to-day care and has not had adequate A1c levels in the past, usually over 7%  RECENT history:  On her last visit in 7/15 her blood sugars are overall worse and she was having more postprandial hyperglycemia along with a A1c of 8.4. She was told to increase her dose of Victoza to 1.8 mg Also she was asked today NovoLog consistently before breakfast and supper which she was not doing and explained to her in detail the need for doing this to control postprandial hyperglycemia. Also was reminded to take the insulin before eating other than 30 minutes later She was also given a trial of Invokana 100 mg daily but she claimed that it made her have a headache and did not feel well and she stopped this after 6 days Her blood sugars appear to be better although she has not checked many readings after meals She is taking her NovoLog mostly in the morning but not consistently because of air hypoglycemia She thinks she may take 2 or 3 times a supper also She was told to reduce her Lantus other changes but still taking 44 units Current regimen patterns: Checking blood sugars mostly in the morning and not clear if some of these are after breakfast; most of them are fairly good with occasional readings up to 190 after meals Has only one or 2 readings after lunch which are not very high Has a couple readings after supper which appear to be relatively higher Occasionally has low normal readings in the mornings but no hypoglycemia at any time Average  blood sugar is better on this visit  Oral hypoglycemic drugs: Metformin        Side effects from medications:  Invokana:? Headache Insulin regimen: Lantus 44 units daily in a.m., NovoLog 6-7 units at times before breakfast or supper      Monitors blood glucose: Once a day.    Glucometer: Accucheck         Blood Glucose readings from meter download: Checking blood sugars mostly in the mornings  PREMEAL Breakfast Lunch Dinner Bedtime Overall  Glucose range:  74-153   ?   151     Mean/median:  118     133   POST-MEAL PC Breakfast PC Lunch PC Dinner  Glucose range:  107-198   167  131-192   Mean/median:        PREMEAL Breakfast Lunch Dinner Bedtime Overall  Glucose range:  112-191    185     Mean/median:  145      152    POST-MEAL PC Breakfast PC Lunch PC Dinner  Glucose range:   208   161, 312   Mean/median:      Hypoglycemia: None        Meals: 2-3 meals per day. Usually light or no lunch. Dinner 7-8 pm Does not have protein with breakfast consistently           Physical activity: exercise: walking 45-60 min, 3-5/7  Wt Readings from Last 3 Encounters:  07/08/14 243 lb 3.2 oz (110.315 kg)  05/28/14 245 lb (111.131 kg)  04/09/14 242 lb 8 oz (109.997 kg)   HbA1c on 12/30/13 was 7.2  Lab Results  Component Value Date   HGBA1C 8.4* 05/27/2014   HGBA1C 7.5* 10/11/2013   HGBA1C 7.7* 07/31/2013   Lab Results  Component Value Date   LDLCALC 112* 05/27/2014   CREATININE 0.99 05/27/2014           Medication List       This list is accurate as of: 07/08/14 11:59 PM.  Always use your most recent med list.               ACCU-CHEK AVIVA PLUS test strip  Generic drug:  glucose blood  USE TO TEST TWICE DAILY.     ALPRAZolam 0.25 MG tablet  Commonly known as:  XANAX  Take 0.25 mg by mouth at bedtime as needed.     amLODipine 10 MG tablet  Commonly known as:  NORVASC  Take 1 tablet (10 mg total) by mouth daily.     Canagliflozin 100 MG Tabs  Commonly known  as:  INVOKANA  Take 1 tablet (100 mg total) by mouth daily before breakfast.     gabapentin 100 MG capsule  Commonly known as:  NEURONTIN     insulin aspart 100 UNIT/ML FlexPen  Commonly known as:  NOVOLOG FLEXPEN  Inject 6-7 units before supper if glucose is high     Insulin Glargine 100 UNIT/ML Solostar Pen  Commonly known as:  LANTUS SOLOSTAR  Inject 44 units daily     Liraglutide 18 MG/3ML Sopn  Commonly known as:  VICTOZA  Inject 1.8 mg daily     metFORMIN 850 MG tablet  Commonly known as:  GLUCOPHAGE  TAKE (1) TABLET BY MOUTH TWICE DAILY.     metoprolol 50 MG tablet  Commonly known as:  LOPRESSOR  TAKE 1 TABLET BY MOUTH TWICE DAILY.     omeprazole 20 MG capsule  Commonly known as:  PRILOSEC  Take 20 mg by mouth daily.     ONE TOUCH ULTRA SYSTEM KIT W/DEVICE Kit  1 kit by Does not apply route once.     onetouch ultrasoft lancets     polyethylene glycol powder powder  Commonly known as:  GLYCOLAX/MIRALAX  Take by mouth as needed.     pravastatin 20 MG tablet  Commonly known as:  PRAVACHOL  Take 1 tablet (20 mg total) by mouth daily.     torsemide 20 MG tablet  Commonly known as:  DEMADEX  TAKE 1 TABLET BY MOUTH ONCE DAILY AS NEEDED.     valsartan 320 MG tablet  Commonly known as:  DIOVAN  Take 320 mg by mouth daily.        Allergies: No Known Allergies  Past Medical History  Diagnosis Date  . Atrial fibrillation     Paroxysmal; LVH; nl EF; onset in 1999  . Hypertension   . Diabetes mellitus     insulin; managed by Dr. Dwyane Dee  . Hyperlipidemia   . Palpitations     negative event recorder in 2009  . Hypothyroidism     history of goiter; nl TSH off medication  . 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 Obesity 07/06/2012    Past Surgical History  Procedure Laterality Date  . Tubal ligation  1980's  . Cholecystectomy  11/95  . Retinal detachment  surgery  2009  . Colonoscopy  2007  . Cesarean section    . Biopsy thyroid       Family History  Problem Relation Age of Onset  . Diabetes Mother   . Diabetes Daughter     gestational diabetes    Social History:  reports that she has quit smoking. She has never used smokeless tobacco. She reports that she does not drink alcohol or use illicit drugs.  Review of Systems:  She is complaining about palpitations on and off including at night. She feels her heart beating relatively fast. She is compliant with her metoprolol and has history of atrial fibrillation  HYPERTENSION:  this is well controlled with current regimen  Her diuretic was stopped when she was tried on Invokana and she does not have any recurrence of swelling  HYPERLIPIDEMIA: The lipid abnormality consists of elevated LDL; she has been on Pravachol but her compliance with this has been  inconsistent.    Lab Results  Component Value Date   CHOL 192 05/27/2014   HDL 61 05/27/2014   LDLCALC 112* 05/27/2014   TRIG 95 05/27/2014   CHOLHDL 3.1 05/27/2014    Multinodular goiter: She has had a long-standing multinodular goiter since at least 2005 which has been fairly stable clinically She does have history of occasional palpitations for years but has been treated with metoprolol by her cardiologist for this Her goiter has been autonomous. No local pressure symptoms of choking or difficulty swallowing She does complain of occasional palpitations but no heat intolerance  No evidence of hyperthyroidism  with normal free T4 and free T3 levels; TSH continues to be suppressed Her I-131 uptake in 2003 was 17% and her  thyroid ultrasound did not show distinct nodules but a relative increased in size  Lab Results  Component Value Date   TSH 0.026* 05/27/2014   TSH 0.088* 02/06/2014   FREET4 1.19 05/27/2014   FREET4 1.06 02/06/2014        Examination:   BP 134/63  Pulse 76  Temp(Src) 98.4 F (36.9 C)  Resp 16  Ht 5' 7"  (1.702 m)  Wt 243 lb 3.2 oz (110.315 kg)  BMI 38.08 kg/m2  SpO2 96%  Body mass index is  38.08 kg/(m^2).    No pedal edema  Heart sounds normal, rhythm regular  ASSESSMENT/ PLAN:  Diabetes Type 2   The patient's diabetes control is difficult to assess as she has not done readings after meals which are typically high However blood sugars overall appear to be better with increase in the Victoza 1.8 mg She had some nonspecific headache with Invokana and not clear this is a true side effect, she does not want to resume this She is still not understanding the need for mealtime insulin coverage consistently and not clear how often she is taking it, maybe taking it mostly at breakfast when the blood sugar is not low normal Currently blood sugars are relatively better after breakfast but probably high after dinner based on her type of meal For now will have her check blood sugars consistently after breakfast and supper and given her guidelines to take her NovoLog if they are persistently over 180 Also discussed how to adjust her Lantus in the morning based on fasting blood sugar trend She will reduce the dose to 40 units for now Regular walking  PALPITATIONS: Not clear if she is having recurrent PAF. She does not have hyperthyroidism and will need to followup with her cardiologist  Hypercholesterolemia:  She needs to be irregular with her pravastatin  Patient Instructions  Lantus 40 units daily in a.m. Continue as long as Under 130 in   Please check blood sugars at least half the time about 2 hours after any meal and 3 times per week on waking up. Please bring blood sugar monitor to each visit  If sugar mostly high , > 180,  about 2 hours after either am or pm meal take Novolog before that meal daily    Counseling time over 50% of today's 25 minute visit   Charlita Brian 07/09/2014, 8:44 AM

## 2014-07-08 NOTE — Patient Instructions (Addendum)
Lantus 40 units daily in a.m. Continue as long as Under 130 in a.m.   Please check blood sugars at least half the time about 2 hours after any meal and 3 times per week on waking up. Please bring blood sugar monitor to each visit  If sugar mostly high , > 180,  about 2 hours after either am or pm meal take Novolog before that meal daily

## 2014-07-17 ENCOUNTER — Emergency Department (HOSPITAL_COMMUNITY): Payer: Medicare Other

## 2014-07-17 ENCOUNTER — Emergency Department (HOSPITAL_COMMUNITY)
Admission: EM | Admit: 2014-07-17 | Discharge: 2014-07-18 | Disposition: A | Discharge status: Home / Self Care | Payer: Medicare Other | Attending: Emergency Medicine | Admitting: Emergency Medicine

## 2014-07-17 ENCOUNTER — Encounter (HOSPITAL_COMMUNITY): Payer: Self-pay | Admitting: Emergency Medicine

## 2014-07-17 DIAGNOSIS — Z794 Long term (current) use of insulin: Secondary | ICD-10-CM | POA: Diagnosis not present

## 2014-07-17 DIAGNOSIS — Z79899 Other long term (current) drug therapy: Secondary | ICD-10-CM | POA: Diagnosis not present

## 2014-07-17 DIAGNOSIS — E785 Hyperlipidemia, unspecified: Secondary | ICD-10-CM | POA: Diagnosis not present

## 2014-07-17 DIAGNOSIS — R112 Nausea with vomiting, unspecified: Secondary | ICD-10-CM | POA: Diagnosis present

## 2014-07-17 DIAGNOSIS — Z87891 Personal history of nicotine dependence: Secondary | ICD-10-CM | POA: Diagnosis not present

## 2014-07-17 DIAGNOSIS — A084 Viral intestinal infection, unspecified: Secondary | ICD-10-CM

## 2014-07-17 DIAGNOSIS — I10 Essential (primary) hypertension: Secondary | ICD-10-CM | POA: Diagnosis not present

## 2014-07-17 DIAGNOSIS — R1013 Epigastric pain: Secondary | ICD-10-CM | POA: Insufficient documentation

## 2014-07-17 DIAGNOSIS — I4891 Unspecified atrial fibrillation: Secondary | ICD-10-CM | POA: Insufficient documentation

## 2014-07-17 DIAGNOSIS — Z91040 Latex allergy status: Secondary | ICD-10-CM | POA: Insufficient documentation

## 2014-07-17 DIAGNOSIS — R000 Tachycardia, unspecified: Secondary | ICD-10-CM | POA: Diagnosis not present

## 2014-07-17 DIAGNOSIS — E1149 Type 2 diabetes mellitus with other diabetic neurological complication: Secondary | ICD-10-CM | POA: Diagnosis not present

## 2014-07-17 DIAGNOSIS — A088 Other specified intestinal infections: Secondary | ICD-10-CM | POA: Diagnosis not present

## 2014-07-17 DIAGNOSIS — E669 Obesity, unspecified: Secondary | ICD-10-CM | POA: Insufficient documentation

## 2014-07-17 DIAGNOSIS — M436 Torticollis: Secondary | ICD-10-CM | POA: Diagnosis not present

## 2014-07-17 LAB — BASIC METABOLIC PANEL
Anion gap: 12 (ref 5–15)
BUN: 15 mg/dL (ref 6–23)
CALCIUM: 9.3 mg/dL (ref 8.4–10.5)
CO2: 26 mEq/L (ref 19–32)
Chloride: 101 mEq/L (ref 96–112)
Creatinine, Ser: 0.97 mg/dL (ref 0.50–1.10)
GFR calc Af Amer: 69 mL/min — ABNORMAL LOW (ref >90)
GFR, EST NON AFRICAN AMERICAN: 59 mL/min — AB (ref >90)
GLUCOSE: 113 mg/dL — AB (ref 70–99)
Potassium: 4.2 mEq/L (ref 3.7–5.3)
SODIUM: 139 meq/L (ref 137–147)

## 2014-07-17 LAB — URINALYSIS, ROUTINE W REFLEX MICROSCOPIC
Bilirubin Urine: NEGATIVE
GLUCOSE, UA: NEGATIVE mg/dL
KETONES UR: NEGATIVE mg/dL
Leukocytes, UA: NEGATIVE
Nitrite: NEGATIVE
PH: 7.5 (ref 5.0–8.0)
PROTEIN: NEGATIVE mg/dL
Specific Gravity, Urine: <1.005 — ABNORMAL LOW (ref 1.005–1.030)
Urobilinogen, UA: 0.2 mg/dL (ref 0.0–1.0)

## 2014-07-17 LAB — CBC WITH DIFFERENTIAL/PLATELET
Basophils Absolute: 0 10*3/uL (ref 0.0–0.1)
Basophils Relative: 0 % (ref 0–1)
EOS ABS: 0.1 10*3/uL (ref 0.0–0.7)
Eosinophils Relative: 1 % (ref 0–5)
HCT: 32.4 % — ABNORMAL LOW (ref 36.0–46.0)
Hemoglobin: 10.9 g/dL — ABNORMAL LOW (ref 12.0–15.0)
Lymphocytes Relative: 8 % — ABNORMAL LOW (ref 12–46)
Lymphs Abs: 1 10*3/uL (ref 0.7–4.0)
MCH: 27.8 pg (ref 26.0–34.0)
MCHC: 33.6 g/dL (ref 30.0–36.0)
MCV: 82.7 fL (ref 78.0–100.0)
MONO ABS: 1.1 10*3/uL — AB (ref 0.1–1.0)
MONOS PCT: 9 % (ref 3–12)
Neutro Abs: 10.9 10*3/uL — ABNORMAL HIGH (ref 1.7–7.7)
Neutrophils Relative %: 82 % — ABNORMAL HIGH (ref 43–77)
PLATELETS: 268 10*3/uL (ref 150–400)
RBC: 3.92 MIL/uL (ref 3.87–5.11)
RDW: 14.2 % (ref 11.5–15.5)
WBC: 13.2 10*3/uL — ABNORMAL HIGH (ref 4.0–10.5)

## 2014-07-17 LAB — HEPATIC FUNCTION PANEL
ALBUMIN: 3.7 g/dL (ref 3.5–5.2)
ALT: 10 U/L (ref 0–35)
AST: 14 U/L (ref 0–37)
Alkaline Phosphatase: 101 U/L (ref 39–117)
Total Bilirubin: 0.3 mg/dL (ref 0.3–1.2)
Total Protein: 7.7 g/dL (ref 6.0–8.3)

## 2014-07-17 LAB — LACTIC ACID, PLASMA: Lactic Acid, Venous: 1.8 mmol/L (ref 0.5–2.2)

## 2014-07-17 LAB — URINE MICROSCOPIC-ADD ON

## 2014-07-17 MED ORDER — IOHEXOL 300 MG/ML  SOLN
50.0000 mL | Freq: Once | INTRAMUSCULAR | Status: AC | PRN
  Administered 2014-07-17: 50 mL via ORAL

## 2014-07-17 MED ORDER — PIPERACILLIN-TAZOBACTAM 3.375 G IVPB 30 MIN
3.3750 g | Freq: Once | INTRAVENOUS | Status: AC
  Administered 2014-07-17: 3.375 g via INTRAVENOUS
  Filled 2014-07-17: qty 50

## 2014-07-17 MED ORDER — SODIUM CHLORIDE 0.9 % IV SOLN
Freq: Once | INTRAVENOUS | Status: AC
  Administered 2014-07-17: 1000 mL via INTRAVENOUS

## 2014-07-17 MED ORDER — ONDANSETRON 8 MG PO TBDP
8.0000 mg | ORAL_TABLET | Freq: Once | ORAL | Status: AC
  Administered 2014-07-17: 8 mg via ORAL
  Filled 2014-07-17: qty 1

## 2014-07-17 MED ORDER — ACETAMINOPHEN 325 MG PO TABS
650.0000 mg | ORAL_TABLET | Freq: Once | ORAL | Status: AC
  Administered 2014-07-17: 650 mg via ORAL
  Filled 2014-07-17: qty 2

## 2014-07-17 MED ORDER — VANCOMYCIN HCL IN DEXTROSE 1-5 GM/200ML-% IV SOLN
1000.0000 mg | Freq: Once | INTRAVENOUS | Status: AC
  Administered 2014-07-17: 1000 mg via INTRAVENOUS
  Filled 2014-07-17: qty 200

## 2014-07-17 MED ORDER — IOHEXOL 300 MG/ML  SOLN: 100.0000 mL | Freq: Once | INTRAMUSCULAR | Status: AC | PRN

## 2014-07-17 NOTE — ED Notes (Signed)
Vomiting today after eating out.  vomited 3 times.

## 2014-07-17 NOTE — ED Provider Notes (Signed)
CSN: [id number]     Arrival date & time 07/17/14  2014 History   First MD Initiated Contact with Patient 07/17/14 2149     Chief Complaint  Patient presents with  . Emesis     (Consider location/radiation/quality/duration/timing/severity/associated sxs/prior Treatment) Patient is a 68 y.o. female presenting with vomiting.  Emesis Associated symptoms: chills   Associated symptoms: no abdominal pain, no arthralgias, no diarrhea and no myalgias    Brittney [name] is a 68 y.o. female diabetic who presents to the Emergency Department complaining of vomiting that began this afternoon at 6:30pm.  States she vomited x 3 and last vomited around 7:30 pm.  She ate at a restaurant this afternoon and felt the food did not "taste right".  She also reports shaking chills and nausea, but denies abdominal pain.  She states the shaking chills resolved after the vomiting stopped.  She also denies chest pain, dysuria, shortness of breath, diarrhea, bloody vomitus or neck pain or stiffness.    Past Medical History  Diagnosis Date  . Atrial fibrillation     Paroxysmal; LVH; nl EF; onset in 1999  . Hypertension   . Diabetes mellitus     insulin; managed by Dr. Dwyane Dee  . Hyperlipidemia   . Palpitations     negative event recorder in 2009  . Hypothyroidism     history of goiter; nl TSH off medication  . 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 Obesity 07/06/2012   Past Surgical History  Procedure Laterality Date  . Tubal ligation  1980's  . Cholecystectomy  11/95  . Retinal detachment surgery  2009  . Colonoscopy  2007  . Cesarean section    . Biopsy thyroid     Family History  Problem Relation Age of Onset  . Diabetes Mother   . Diabetes Daughter     gestational diabetes   History  Substance Use Topics  . Smoking status: Former Research scientist (life sciences)  . Smokeless tobacco: Never Used  . Alcohol Use: No   OB History   Grav Para Term Preterm Abortions TAB SAB Ect Mult Living   5 2   3   3         Review of Systems  Constitutional: Positive for chills. Negative for fever, activity change and appetite change.  HENT: Negative for congestion and trouble swallowing.   Respiratory: Negative for chest tightness and shortness of breath.   Cardiovascular: Negative for chest pain.  Gastrointestinal: Positive for nausea and vomiting. Negative for abdominal pain, diarrhea, constipation and blood in stool.  Genitourinary: Negative for dysuria, flank pain, decreased urine volume, difficulty urinating and pelvic pain.  Musculoskeletal: Positive for neck stiffness. Negative for arthralgias, back pain, myalgias and neck pain.  Skin: Negative for color change and rash.  Neurological: Negative for dizziness, syncope, weakness and numbness.  Hematological: Negative for adenopathy.  Psychiatric/Behavioral: Negative for confusion.  All other systems reviewed and are negative.     Allergies  Latex  Home Medications   Prior to Admission medications   Medication Sig Start Date End Date Taking? Authorizing Provider  ALPRAZolam (XANAX) 0.25 MG tablet Take 0.25 mg by mouth at bedtime as needed.     Yes Historical Provider, MD  amLODipine (NORVASC) 10 MG tablet Take 1 tablet (10 mg total) by mouth daily. 04/18/14  Yes Herminio Commons, MD  Insulin Glargine (LANTUS SOLOSTAR) 100 UNIT/ML Solostar Pen Inject 44 units daily 03/18/14  Yes Elayne Snare, MD  Liraglutide (  VICTOZA) 18 MG/3ML SOPN Inject 1.8 mg daily 05/28/14  Yes Elayne Snare, MD  metFORMIN (GLUCOPHAGE) 850 MG tablet TAKE (1) TABLET BY MOUTH TWICE DAILY. 06/20/14  Yes Elayne Snare, MD  metoprolol (LOPRESSOR) 50 MG tablet Take 25 mg by mouth 2 (two) times daily.   Yes Historical Provider, MD  omeprazole (PRILOSEC) 20 MG capsule Take 20 mg by mouth daily.     Yes Historical Provider, MD  pravastatin (PRAVACHOL) 20 MG tablet Take 1 tablet (20 mg total) by mouth daily. 05/28/14  Yes Elayne Snare, MD  torsemide (DEMADEX) 20 MG tablet TAKE 1 TABLET BY  MOUTH ONCE DAILY AS NEEDED. 05/20/14  Yes Herminio Commons, MD  valsartan (DIOVAN) 320 MG tablet Take 320 mg by mouth daily.     Yes Historical Provider, MD  ACCU-CHEK AVIVA PLUS test strip USE TO TEST TWICE DAILY. 04/11/14   Elayne Snare, MD  Blood Glucose Monitoring Suppl (ONE TOUCH ULTRA SYSTEM KIT) W/DEVICE KIT 1 kit by Does not apply route once. 07/31/13   Elayne Snare, MD  insulin aspart (NOVOLOG) 100 UNIT/ML FlexPen Inject 6 Units into the skin as needed. Inject 6-7 units before supper if glucose is high 03/18/14   Elayne Snare, MD  Lancets [facility] ULTRASOFT) lancets  04/29/13   Historical Provider, MD  metoprolol (LOPRESSOR) 50 MG tablet None Entered 08/22/13 07/17/14  Herminio Commons, MD  polyethylene glycol powder (GLYCOLAX/MIRALAX) powder Take 1 Container by mouth as needed for moderate constipation.  09/23/13   Historical Provider, MD   BP 146/63  Pulse 115  Temp(Src) 103.2 F (39.6 C) (Oral)  Resp 22  Ht 5' 7"  (1.702 m)  Wt 237 lb (107.502 kg)  BMI 37.11 kg/m2  SpO2 98%  Physical Exam  Nursing note and vitals reviewed. Constitutional: She is oriented to person, place, and time. She appears well-developed and well-nourished. No distress.  Non-toxic appearing  HENT:  Head: Normocephalic and atraumatic.  Mouth/Throat: Oropharynx is clear and moist.  Eyes: Conjunctivae are normal. Pupils are equal, round, and reactive to light.  Neck: Normal range of motion. Neck supple. No thyromegaly present.  Cardiovascular: Regular rhythm, normal heart sounds and intact distal pulses.  Tachycardia present.   No murmur heard. Pulmonary/Chest: Effort normal and breath sounds normal. No respiratory distress. She exhibits no tenderness.  Abdominal: Soft. Normal appearance and bowel sounds are normal. She exhibits no distension and no mass. There is no hepatosplenomegaly. There is tenderness in the epigastric area. There is no rebound, no guarding, no CVA tenderness and no tenderness at McBurney's  point.  Musculoskeletal: Normal range of motion. She exhibits no edema.  Lymphadenopathy:    She has no cervical adenopathy.  Neurological: She is alert and oriented to person, place, and time. She exhibits normal muscle tone. Coordination normal.  Skin: Skin is warm and dry. No rash noted.  Psychiatric: She has a normal mood and affect. Her behavior is normal. Thought content normal.    ED Course  Procedures (including critical care time) Labs Review Labs Reviewed  CBC WITH DIFFERENTIAL - Abnormal; Notable for the following:    WBC 13.2 (*)    Hemoglobin 10.9 (*)    HCT 32.4 (*)    Neutrophils Relative % 82 (*)    Neutro Abs 10.9 (*)    Lymphocytes Relative 8 (*)    Monocytes Absolute 1.1 (*)    All other components within normal limits  BASIC METABOLIC PANEL - Abnormal; Notable for the following:    Glucose,  Bld 113 (*)    GFR calc non Af Amer 59 (*)    GFR calc Af Amer 69 (*)    All other components within normal limits  URINALYSIS, ROUTINE W REFLEX MICROSCOPIC - Abnormal; Notable for the following:    Specific Gravity, Urine <1.005 (*)    Hgb urine dipstick TRACE (*)    All other components within normal limits  URINE MICROSCOPIC-ADD ON - Abnormal; Notable for the following:    Squamous Epithelial / LPF MANY (*)    Bacteria, UA FEW (*)    All other components within normal limits  CULTURE, BLOOD (ROUTINE X 2)  CULTURE, BLOOD (ROUTINE X 2)  URINE CULTURE  LACTIC ACID, PLASMA  HEPATIC FUNCTION PANEL    Imaging Review Ct Abdomen Pelvis W Contrast  07/18/2014   CLINICAL DATA:  Vomiting, fever, and abdominal pain.  EXAM: CT ABDOMEN AND PELVIS WITH CONTRAST  TECHNIQUE: Multidetector CT imaging of the abdomen and pelvis was performed using the standard protocol following bolus administration of intravenous contrast.  CONTRAST:  61m OMNIPAQUE IOHEXOL 300 MG/ML SOLN, 1076mOMNIPAQUE IOHEXOL 300 MG/ML SOLN  COMPARISON:  03/21/2009  FINDINGS: Lung bases are clear.  The liver,  spleen, pancreas, adrenal glands, kidneys, abdominal aorta, inferior vena cava, and retroperitoneal lymph nodes are unremarkable. Gallbladder is surgically absent. Stomach appears normal. Small bowel appear normal for degree of distention. Stool-filled colon without distention. No free air or free fluid in the abdomen.  Pelvis: Appendix is normal. Uterus and ovaries are not enlarged. Calcification adjacent to the uterine fundus probably represents calcified fibroid or phlebolith. No free or loculated pelvic fluid collections. No pelvic mass or lymphadenopathy. Diverticula in the sigmoid colon without evidence of diverticulitis. Degenerative changes in the spine. No destructive bone lesions.  IMPRESSION: No acute process demonstrated in the abdomen or pelvis.   Electronically Signed   By: WiLucienne Capers.D.   On: 07/18/2014 00:48   Dg Chest Portable 1 View  07/17/2014   CLINICAL DATA:  Vomiting.  EXAM: PORTABLE CHEST - 1 VIEW  COMPARISON:  Chest radiograph 01/08/2008  FINDINGS: Stable enlarged cardiac and mediastinal contours. No consolidative pulmonary opacities. No pleural effusion or pneumothorax. Regional skeleton is unremarkable.  IMPRESSION: No acute cardiopulmonary process.   Electronically Signed   By: DrLovey Newcomer.D.   On: 07/17/2014 22:45     EKG Interpretation None      Urine and blood cultures pending   Recheck of vitals:  Filed Vitals:   07/18/14 0001  BP: 136/56  Pulse: 81  Temp:   Resp: 20  ;  MDM   Final diagnoses:  Viral gastroenteritis    Pt is non-toxic appearing, had 3 episodes of vomiting, is febrile and tachycardic on arrival.  Will order septic work-up and start broad spectrum antibiotics.    Patient also seen by Dr. WaLeonides Schanznd care plan discussed.    Patient is feeling much better, vitals improved.  Tolerated po fluids, no further vomiting during ED stay and requesting d/c.  Abd is soft, NT.  Anion gap is 12.  Symptoms likely related to a viral  gastroenteritis.  She agrees to bland diet, fluids, and close f/u with her PMD for recheck, strict precautions for return given.      Kaeleen Odom L. Florance Paolillo, PA-C 07/18/14 0120

## 2014-07-18 DIAGNOSIS — A088 Other specified intestinal infections: Secondary | ICD-10-CM | POA: Diagnosis not present

## 2014-07-18 MED ORDER — IOHEXOL 300 MG/ML  SOLN
100.0000 mL | Freq: Once | INTRAMUSCULAR | Status: AC | PRN
  Administered 2014-07-18: 100 mL via INTRAVENOUS

## 2014-07-18 MED ORDER — ONDANSETRON HCL 8 MG PO TABS: 8.0000 mg | ORAL_TABLET | Freq: Three times a day (TID) | ORAL | Status: DC | PRN

## 2014-07-18 NOTE — Discharge Instructions (Signed)
Viral Gastroenteritis Viral gastroenteritis is also called stomach flu. This illness is caused by a certain type of germ (virus). It can cause sudden watery poop (diarrhea) and throwing up (vomiting). This can cause you to lose body fluids (dehydration). This illness usually lasts for 3 to 8 days. It usually goes away on its own. HOME CARE   Drink enough fluids to keep your pee (urine) clear or pale yellow. Drink small amounts of fluids often.  Ask your doctor how to replace body fluid losses (rehydration).  Avoid:  Foods high in sugar.  Alcohol.  Bubbly (carbonated) drinks.  Tobacco.  Juice.  Caffeine drinks.  Very hot or cold fluids.  Fatty, greasy foods.  Eating too much at one time.  Dairy products until 24 to 48 hours after your watery poop stops.  You may eat foods with active cultures (probiotics). They can be found in some yogurts and supplements.  Wash your hands well to avoid spreading the illness.  Only take medicines as told by your doctor. Do not give aspirin to children. Do not take medicines for watery poop (antidiarrheals).  Ask your doctor if you should keep taking your regular medicines.  Keep all doctor visits as told. GET HELP RIGHT AWAY IF:   You cannot keep fluids down.  You do not pee at least once every 6 to 8 hours.  You are short of breath.  You see blood in your poop or throw up. This may look like coffee grounds.  You have belly (abdominal) pain that gets worse or is just in one small spot (localized).  You keep throwing up or having watery poop.  You have a fever.  The patient is a child younger than 3 months, and he or she has a fever.  The patient is a child older than 3 months, and he or she has a fever and problems that do not go away.  The patient is a child older than 3 months, and he or she has a fever and problems that suddenly get worse.  The patient is a baby, and he or she has no tears when crying. MAKE SURE YOU:     Understand these instructions.  Will watch your condition.  Will get help right away if you are not doing well or get worse. Document Released: 04/25/2008 Document Revised: 01/30/2012 Document Reviewed: 08/24/2011 [facility] Patient Information 2015 [place], [place]. This information is not intended to replace advice given to you by your health care provider. Make sure you discuss any questions you have with your health care provider.

## 2014-07-18 NOTE — ED Provider Notes (Signed)
Medical screening examination/treatment/procedure(s) were conducted as a shared visit with non-physician practitioner(s) and myself.  I personally evaluated the patient during the encounter.   EKG Interpretation None      Pt is a 68 y.o. F with history of paroxysmal atrial fibrillation, hypertension, diabetes, hyperlipidemia, hypothyroidism who presents to the emergency department with fever, vomiting x3 and one episode of loose stool. Sepsis workup was initially started as she is febrile to 103.2 with a heart rate of 115. She was normotensive. Labs show leukocytosis of 13 with left shift. Chest x-ray and urine showed no sign of infection. Cultures pending. CT of her abdomen shows no acute abnormality. Likely viral illness. She was given broad-spectrum antibiotics in the emergency department and she met SIRS criteria. I do not feel she needs antibiotics continued at home.  [place], DO 07/18/14 [phone]

## 2014-07-20 LAB — URINE CULTURE

## 2014-07-21 ENCOUNTER — Telehealth (HOSPITAL_BASED_OUTPATIENT_CLINIC_OR_DEPARTMENT_OTHER): Payer: Self-pay | Admitting: Emergency Medicine

## 2014-07-21 ENCOUNTER — Ambulatory Visit (HOSPITAL_COMMUNITY)
Admission: RE | Admit: 2014-07-21 | Discharge: 2014-07-21 | Disposition: A | Discharge status: Home / Self Care | Payer: Medicare Other | Source: Ambulatory Visit | Attending: Family Medicine | Admitting: Family Medicine

## 2014-07-21 ENCOUNTER — Other Ambulatory Visit: Payer: Self-pay | Admitting: *Deleted

## 2014-07-21 ENCOUNTER — Other Ambulatory Visit: Payer: Self-pay | Admitting: Cardiovascular Disease

## 2014-07-21 DIAGNOSIS — Z1231 Encounter for screening mammogram for malignant neoplasm of breast: Secondary | ICD-10-CM | POA: Insufficient documentation

## 2014-07-21 MED ORDER — INSULIN GLARGINE 100 UNIT/ML SOLOSTAR PEN: PEN_INJECTOR | SUBCUTANEOUS | Status: DC

## 2014-07-21 NOTE — Telephone Encounter (Addendum)
Post ED Visit - Positive Culture Follow-up: Successful Patient Follow-Up  Culture assessed and recommendations reviewed by: []  Wes Donnella Sham, Pharm.D., BCPS []  Heide Guile, Pharm.D., BCPS []  Alycia Rossetti, Pharm.D., BCPS []  [place], Pharm.D., BCPS, AAHIVP []  Legrand Como, Pharm.D., BCPS, AAHIVP []  Hassie Bruce, Pharm.D. [x]  Milus Glazier, [place].D.   Positive urine culture >100,000 colonies/ml  [x]  Patient discharged without antimicrobial prescription and treatment is now indicated , patient with some flank pain and dysuria []  Organism is resistant to prescribed ED discharge antimicrobial []  Patient with positive blood cultures  Changes discussed with ED provider: Clayton Bibles PA New antibiotic prescription cephalexin 500mg  po bid x 7 days Called to [facility] [phone]  Contacted patient, date 07/21/14 , time 1307   Hazle Nordmann 07/21/2014, 1:05 PM

## 2014-07-21 NOTE — Progress Notes (Signed)
ED Antimicrobial Stewardship Positive Culture Follow Up   Brittney [name] is an 68 y.o. female who presented to [facility] on 07/17/2014 with a chief complaint of  Chief Complaint  Patient presents with  . Emesis    Recent Results (from the past 720 hour(s))  CULTURE, BLOOD (ROUTINE X 2)     Status: None   Collection Time    07/17/14  9:34 PM      Result Value Ref Range Status   Specimen Description BLOOD RIGHT ANTECUBITAL   Final   Special Requests BOTTLES DRAWN AEROBIC AND ANAEROBIC Home   Final   Culture NO GROWTH 4 DAYS   Final   Report Status PENDING   Incomplete  CULTURE, BLOOD (ROUTINE X 2)     Status: None   Collection Time    07/17/14 10:48 PM      Result Value Ref Range Status   Specimen Description BLOOD RIGHT ARM   Final   Special Requests     Final   Value: BOTTLES DRAWN AEROBIC AND ANAEROBIC AEB 8CC ANA 4CC   Culture NO GROWTH 4 DAYS   Final   Report Status PENDING   Incomplete  URINE CULTURE     Status: None   Collection Time    07/17/14 11:12 PM      Result Value Ref Range Status   Specimen Description URINE, RANDOM   Final   Special Requests NONE   Final   Culture  Setup Time     Final   Value: 07/18/2014 13:57     Performed at [place]     Final   Value: >=100,000 COLONIES/ML     Performed at [facility]   Culture     Final   Value: ESCHERICHIA COLI     Performed at [facility]   Report Status 07/20/2014 FINAL   Final   Organism ID, Bacteria ESCHERICHIA COLI   Final    [x]  Patient discharged originally without antimicrobial agent and treatment is now indicated  New antibiotic prescription: Patient is asymptomatic with a UA that has many squamous cells indicating that it is contaminated.  Will call the patient and do a symptom check.  If symptomatic, will treat with cephalexin 500 mg PO BID x 7 days. If not symptomatic, no treatment is indicated at this time.  ED Provider: Clayton Bibles, PA-C  Nicole Kindred,  Gillian Shields 07/21/2014, 12:01 PM Infectious Diseases Pharmacist Phone# [phone]

## 2014-07-22 LAB — CULTURE, BLOOD (ROUTINE X 2)
CULTURE: NO GROWTH
Culture: NO GROWTH

## 2014-07-30 ENCOUNTER — Telehealth: Payer: Self-pay | Admitting: Endocrinology

## 2014-07-30 NOTE — Telephone Encounter (Signed)
Patient states that the form for her diabetic shoes was filled out in July and she still does not have her shoes  She does not have any shoes to wear!   Please advise patient    Thank you

## 2014-07-31 NOTE — Telephone Encounter (Signed)
I spoke with patient who said the company ordered her shoes, but then said they had to send them back because they didn't get an order from our office, there is no way to could have ordered them without a written order.  Patient spoke with someone at the foot center who was going to find out what's going on.

## 2014-07-31 NOTE — Telephone Encounter (Signed)
Patient stated that you called her and left a message, and she didn't quite understand what you were saying, Please call her back.

## 2014-08-08 ENCOUNTER — Other Ambulatory Visit: Payer: Self-pay | Admitting: *Deleted

## 2014-08-08 MED ORDER — PRAVASTATIN SODIUM 20 MG PO TABS: 20.0000 mg | ORAL_TABLET | Freq: Every day | ORAL | Status: DC

## 2014-08-13 ENCOUNTER — Encounter: Payer: Self-pay | Admitting: Cardiovascular Disease

## 2014-08-13 ENCOUNTER — Ambulatory Visit (INDEPENDENT_AMBULATORY_CARE_PROVIDER_SITE_OTHER): Payer: Medicare Other | Admitting: Cardiovascular Disease

## 2014-08-13 VITALS — BP 138/60 | HR 80 | Ht 67.0 in | Wt 239.0 lb

## 2014-08-13 DIAGNOSIS — I10 Essential (primary) hypertension: Secondary | ICD-10-CM

## 2014-08-13 DIAGNOSIS — E785 Hyperlipidemia, unspecified: Secondary | ICD-10-CM

## 2014-08-13 DIAGNOSIS — I480 Paroxysmal atrial fibrillation: Secondary | ICD-10-CM

## 2014-08-13 DIAGNOSIS — E119 Type 2 diabetes mellitus without complications: Secondary | ICD-10-CM

## 2014-08-13 DIAGNOSIS — I4891 Unspecified atrial fibrillation: Secondary | ICD-10-CM

## 2014-08-13 DIAGNOSIS — E038 Other specified hypothyroidism: Secondary | ICD-10-CM

## 2014-08-13 NOTE — Patient Instructions (Signed)
Your physician wants you to follow-up in: 1 year You will receive a reminder letter in the mail two months in advance. If you don't receive a letter, please call our office to schedule the follow-up appointment.    Your physician recommends that you continue on your current medications as directed. Please refer to the Current Medication list given to you today.     Thank you for choosing [facility] !  

## 2014-08-13 NOTE — Progress Notes (Signed)
Patient ID: Brittney [name], female   DOB: 1946/01/07, 68 y.o.   MRN: [id number]      SUBJECTIVE: The patient returns for followup for a remote history of paroxysmal atrial fibrillation. She occasionally experiences palpitations when lying down at night on her left side but they are alleviated when lying on her right side. This may happen in the mornings. They spontaneously resolve and are not associated with chest pain or shortness of breath. ECG performed the office today demonstrates normal sinus rhythm, heart rate 86 beats per minute.   Review of Systems: As per "subjective", otherwise negative.  Allergies  Allergen Reactions  . Latex     Itching and breaking out    Current Outpatient Prescriptions  Medication Sig Dispense Refill  . ACCU-CHEK AVIVA PLUS test strip USE TO TEST TWICE DAILY.  100 each  3  . ALPRAZolam (XANAX) 0.25 MG tablet Take 0.25 mg by mouth at bedtime as needed.        Marland Kitchen amLODipine (NORVASC) 10 MG tablet Take 1 tablet (10 mg total) by mouth daily.  30 tablet  6  . Blood Glucose Monitoring Suppl (ONE TOUCH ULTRA SYSTEM KIT) W/DEVICE KIT 1 kit by Does not apply route once.  1 each  0  . insulin aspart (NOVOLOG) 100 UNIT/ML FlexPen Inject 6 Units into the skin as needed. Inject 6-7 units before supper if glucose is high      . Insulin Glargine (LANTUS SOLOSTAR) 100 UNIT/ML Solostar Pen Inject 44 units daily  5 pen  3  . Lancets (ONETOUCH ULTRASOFT) lancets       . Liraglutide (VICTOZA) 18 MG/3ML SOPN Inject 1.8 mg daily  9 mL  3  . metFORMIN (GLUCOPHAGE) 850 MG tablet TAKE (1) TABLET BY MOUTH TWICE DAILY.  60 tablet  2  . metoprolol (LOPRESSOR) 50 MG tablet Take 25 mg by mouth 2 (two) times daily.      Marland Kitchen omeprazole (PRILOSEC) 20 MG capsule Take 20 mg by mouth daily.        . ondansetron (ZOFRAN) 8 MG tablet Take 1 tablet (8 mg total) by mouth every 8 (eight) hours as needed for nausea or vomiting.  10 tablet  0  . polyethylene glycol powder (GLYCOLAX/MIRALAX) powder  Take 1 Container by mouth as needed for moderate constipation.       . pravastatin (PRAVACHOL) 20 MG tablet Take 1 tablet (20 mg total) by mouth daily.  90 tablet  1  . torsemide (DEMADEX) 20 MG tablet TAKE 1 TABLET BY MOUTH ONCE DAILY AS NEEDED.  30 tablet  6  . valsartan (DIOVAN) 320 MG tablet Take 320 mg by mouth daily.         No current facility-administered medications for this visit.    Past Medical History  Diagnosis Date  . Atrial fibrillation     Paroxysmal; LVH; nl EF; onset in 1999  . Hypertension   . Diabetes mellitus     insulin; managed by Dr. Dwyane Dee  . Hyperlipidemia   . Palpitations     negative event recorder in 2009  . Hypothyroidism     history of goiter; nl TSH off medication  . 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 Obesity 07/06/2012    Past Surgical History  Procedure Laterality Date  . Tubal ligation  1980's  . Cholecystectomy  11/95  . Retinal detachment surgery  2009  . Colonoscopy  2007  . Cesarean section    .  Biopsy thyroid      History   Social History  . Marital Status: Married    Spouse Name: N/A    Number of Children: N/A  . Years of Education: N/A   Occupational History  . Retired    Social History Main Topics  . Smoking status: Former Smoker -- 1.00 packs/day    Types: Cigarettes    Quit date: 08/13/1986  . Smokeless tobacco: Never Used  . Alcohol Use: No  . Drug Use: No  . Sexual Activity: No   Other Topics Concern  . Not on file   Social History Narrative   No regular exercise     Filed Vitals:   08/13/14 1054  Height: _0  (1.702 m)   BP 138/60 Pulse 80 Resp Temp Temp src SpO2 Weight 239 lb (108.41 kg) Height _1  (1.702 m)   PHYSICAL EXAM General: NAD HEENT: Normal. Neck: No JVD, no thyromegaly. Lungs: Clear to auscultation bilaterally with normal respiratory effort. CV: Nondisplaced PMI.  Regular rate and rhythm, normal S1/S2, no S3/S4, no murmur. No pretibial or periankle edema.   No carotid bruit.  Normal pedal pulses.  Abdomen: Soft, nontender, no hepatosplenomegaly, no distention.  Neurologic: Alert and oriented x 3.  Psych: Normal affect. Skin: Normal. Musculoskeletal: Normal range of motion, no gross deformities. Extremities: No clubbing or cyanosis.   ECG: Most recent ECG reviewed.      ASSESSMENT AND PLAN: 1. Paroxysmal atrial fibrillation: She has a very remote history, and thus has never been on anticoagulation. She seldom has palpitations which are short lived. Continue metoprolol at present dose. If she were to have documented recurrences, given her history of HTN and diabetes, she would warrant anticoagulation. 2. Essential HTN: Controlled on valsartan and amlodipine. 3. Hypothyroidism: Managed by PCP. 4. Hyperlipidemia: Lipid panel on 05/27/2014 demonstrated total cholesterol 192, triglycerides 95, HDL 61, LDL 112. On pravastatin 20 mg. 5. Type 2 diabetes: Followed by PCP. On insulin, Victoza and metformin.  Dispo: f/u 1 year.  Kate Sable, M.D., F.A.C.C.

## 2014-09-09 ENCOUNTER — Encounter: Payer: Self-pay | Admitting: Endocrinology

## 2014-09-09 ENCOUNTER — Ambulatory Visit (INDEPENDENT_AMBULATORY_CARE_PROVIDER_SITE_OTHER): Payer: Medicare Other | Admitting: Endocrinology

## 2014-09-09 VITALS — BP 148/68 | HR 86 | Temp 97.9°F | Resp 14 | Ht 67.0 in | Wt 238.2 lb

## 2014-09-09 DIAGNOSIS — E1165 Type 2 diabetes mellitus with hyperglycemia: Secondary | ICD-10-CM

## 2014-09-09 DIAGNOSIS — E040 Nontoxic diffuse goiter: Secondary | ICD-10-CM

## 2014-09-09 DIAGNOSIS — IMO0002 Reserved for concepts with insufficient information to code with codable children: Secondary | ICD-10-CM

## 2014-09-09 DIAGNOSIS — E785 Hyperlipidemia, unspecified: Secondary | ICD-10-CM

## 2014-09-09 DIAGNOSIS — I10 Essential (primary) hypertension: Secondary | ICD-10-CM

## 2014-09-09 DIAGNOSIS — E042 Nontoxic multinodular goiter: Secondary | ICD-10-CM

## 2014-09-09 LAB — COMPREHENSIVE METABOLIC PANEL
ALK PHOS: 96 U/L (ref 39–117)
ALT: 12 U/L (ref 0–35)
AST: 17 U/L (ref 0–37)
Albumin: 3.3 g/dL — ABNORMAL LOW (ref 3.5–5.2)
BILIRUBIN TOTAL: 0.4 mg/dL (ref 0.2–1.2)
BUN: 23 mg/dL (ref 6–23)
CO2: 22 meq/L (ref 19–32)
CREATININE: 1.3 mg/dL — AB (ref 0.4–1.2)
Calcium: 9.5 mg/dL (ref 8.4–10.5)
Chloride: 103 mEq/L (ref 96–112)
GFR: 52.96 mL/min — ABNORMAL LOW (ref >60.00)
GLUCOSE: 209 mg/dL — AB (ref 70–99)
Potassium: 4.6 mEq/L (ref 3.5–5.1)
SODIUM: 136 meq/L (ref 135–145)
Total Protein: 7.6 g/dL (ref 6.0–8.3)

## 2014-09-09 LAB — LIPID PANEL
CHOLESTEROL: 158 mg/dL (ref 0–200)
HDL: 58.8 mg/dL (ref >39.00)
LDL Cholesterol: 75 mg/dL (ref 0–99)
NonHDL: 99.2
Total CHOL/HDL Ratio: 3
Triglycerides: 120 mg/dL (ref 0.0–149.0)
VLDL: 24 mg/dL (ref 0.0–40.0)

## 2014-09-09 LAB — MICROALBUMIN / CREATININE URINE RATIO
Creatinine,U: 177.4 mg/dL
Microalb Creat Ratio: 0.1 mg/g (ref 0.0–30.0)
Microalb, Ur: 0.1 mg/dL (ref 0.0–1.9)

## 2014-09-09 LAB — HEMOGLOBIN A1C: Hgb A1c MFr Bld: 8.1 % — ABNORMAL HIGH (ref 4.6–6.5)

## 2014-09-09 LAB — T4, FREE: FREE T4: 1.01 ng/dL (ref 0.60–1.60)

## 2014-09-09 LAB — T3, FREE: T3, Free: 2.6 pg/mL (ref 2.3–4.2)

## 2014-09-09 NOTE — Patient Instructions (Addendum)
Try 1/2 tab Invokana early am and after 1 week try 1 pill daily and may leave off Demadex  Lantus 40 units and reduce 2 more if am sugar stay <90  More sugars 2 hrs after meals  Walk daily  Check BP weekly

## 2014-09-09 NOTE — Progress Notes (Signed)
Patient ID: Brittney [name], female   DOB: 06/10/1946, 68 y.o.   MRN: [id number]   Reason for Appointment: Followup of various issues  History of Present Illness    Type 2 DIABETES MELITUS, date of diagnosis: 1976      She has had long-standing diabetes taking insulin. Because of her difficulties with compliance using mealtime insulin she was given in [place] in 2011 and subsequently Victoza in addition to her Lantus and this helped her control However she tends to be erratic with her day-to-day care and has not had adequate A1c levels in the past, usually over 7%  RECENT history:  Her blood sugars are still somewhat inconsistent and she has variable compliance with instructions for insulin Because of tendency to relatively lower fasting readings she was advised to reduce her Lantus to 40 units but still taking 44 units She has occasional mornings when she will wake up with hypoglycemic symptoms including palpitations although has only a couple of low normal readings documented Current glucose patterns and problems:  She does only a few readings after meals but not consistently   She has periodic high readings after meals, highest 238  Most of her high readings after meals are related to noncompliance with taking her NOVOLOG  Will have larger meal at lunch occasionally and this will cause hyperglycemia  She is taking her NovoLog mostly at suppertime and is afraid to take it in the morning if blood sugar is near-normal; however still has some postprandial hyperglycemia after breakfast She was told to increase her dose of Victoza to 1.8 mg and has continued this  Hypoglycemia: early am occasionally  Oral hypoglycemic drugs: Metformin        Side effects from medications:  Invokana:? Headache Insulin regimen: Lantus 44 units daily in a.m., NovoLog 6-7 units at times before breakfast or supper, mostly supper      Monitors blood glucose: Once a day.    Glucometer: Accucheck         Blood  Glucose readings from meter download: Checking blood sugars mostly in the mornings  PREMEAL Breakfast Lunch Dinner Bedtime Overall  Glucose range:  76-191   122, 208  77, 106     Mean/median:  130     133   POST-MEAL PC Breakfast PC Lunch PC Dinner  Glucose range:   188, 238   95-188   Mean/median:            Meals: 2-3 meals per day. Usually light or no lunch. Dinner 7-8 pm Does not have protein with breakfast consistently           Physical activity: exercise: was walking 45-60 min, 3-5/7            Wt Readings from Last 3 Encounters:  09/09/14 238 lb 3.2 oz (108.047 kg)  08/13/14 239 lb (108.41 kg)  07/17/14 237 lb (107.502 kg)   GLYCEMIC CONTROL:  Lab Results  Component Value Date   HGBA1C 8.4* 05/27/2014   HGBA1C 7.5* 10/11/2013   HGBA1C 7.7* 07/31/2013   Lab Results  Component Value Date   LDLCALC 112* 05/27/2014   CREATININE 0.97 07/17/2014           Medication List       This list is accurate as of: 09/09/14 11:00 AM.  Always use your most recent med list.               ACCU-CHEK AVIVA PLUS test strip  Generic drug:  glucose blood  USE TO TEST TWICE DAILY.     ALPRAZolam 0.25 MG tablet  Commonly known as:  XANAX  Take 0.25 mg by mouth at bedtime as needed.     amLODipine 10 MG tablet  Commonly known as:  NORVASC  Take 1 tablet (10 mg total) by mouth daily.     insulin aspart 100 UNIT/ML FlexPen  Commonly known as:  NOVOLOG  Inject 6 Units into the skin as needed. Inject 6-7 units before supper if glucose is high     Insulin Glargine 100 UNIT/ML Solostar Pen  Commonly known as:  LANTUS SOLOSTAR  Inject 44 units daily     Liraglutide 18 MG/3ML Sopn  Commonly known as:  VICTOZA  Inject 1.8 mg daily     metFORMIN 850 MG tablet  Commonly known as:  GLUCOPHAGE  TAKE (1) TABLET BY MOUTH TWICE DAILY.     metoprolol 50 MG tablet  Commonly known as:  LOPRESSOR  Take 25 mg by mouth 2 (two) times daily.     omeprazole 20 MG capsule  Commonly  known as:  PRILOSEC  Take 20 mg by mouth daily.     ONE TOUCH ULTRA SYSTEM KIT W/DEVICE Kit  1 kit by Does not apply route once.     onetouch ultrasoft lancets     polyethylene glycol powder powder  Commonly known as:  GLYCOLAX/MIRALAX  Take 1 Container by mouth as needed for moderate constipation.     pravastatin 20 MG tablet  Commonly known as:  PRAVACHOL  Take 1 tablet (20 mg total) by mouth daily.     torsemide 20 MG tablet  Commonly known as:  DEMADEX  TAKE 1 TABLET BY MOUTH ONCE DAILY AS NEEDED.     valsartan 320 MG tablet  Commonly known as:  DIOVAN  Take 320 mg by mouth daily.        Allergies:  Allergies  Allergen Reactions  . Latex     Itching and breaking out    Past Medical History  Diagnosis Date  . Atrial fibrillation     Paroxysmal; LVH; nl EF; onset in 1999  . Hypertension   . Diabetes mellitus     insulin; managed by Dr. Kumar  . Hyperlipidemia   . Palpitations     negative event recorder in 2009  . Hypothyroidism     history of goiter; nl TSH off medication  . 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 Obesity 07/06/2012    Past Surgical History  Procedure Laterality Date  . Tubal ligation  1980's  . Cholecystectomy  11/95  . Retinal detachment surgery  2009  . Colonoscopy  2007  . Cesarean section    . Biopsy thyroid      Family History  Problem Relation Age of Onset  . Diabetes Mother   . Diabetes Daughter     gestational diabetes    Social History:  reports that she quit smoking about 28 years ago. Her smoking use included Cigarettes. She smoked 1.00 pack per day. She has never used smokeless tobacco. She reports that she does not drink alcohol or use illicit drugs.  Review of Systems:  She is complaining less about palpitations recently.  She previously worked feel her heart beating relatively fast. She is compliant with her metoprolol and has history of atrial fibrillation  HYPERTENSION:  this is well  controlled with current regimen, she lost her Diovan medication and has not taken any for 2   days  She takes her diuretic every other day and will get cramps if she takes it every day. Usually would take it when she sees some swelling  HYPERLIPIDEMIA: The lipid abnormality consists of elevated LDL; she has been on Pravachol and recently her compliance with this has been  better.    Lab Results  Component Value Date   CHOL 192 05/27/2014   HDL 61 05/27/2014   LDLCALC 112* 05/27/2014   TRIG 95 05/27/2014   CHOLHDL 3.1 05/27/2014    Multinodular goiter: She has had a long-standing multinodular goiter since at least 2005 which has been fairly stable clinically She does have history of occasional palpitations for years but has been treated with metoprolol by her cardiologist for this Her goiter has been autonomous. No local pressure symptoms of choking or difficulty swallowing She does complain of occasional palpitations but no heat intolerance  No evidence of overt hyperthyroidism  with normal free T4 and free T3 levels; TSH continues to be suppressed Her I-131 uptake in 2003 was 17% and her  thyroid ultrasound in 2014 did not show distinct nodules but a relative increase in size  Lab Results  Component Value Date   TSH 0.026* 05/27/2014   TSH 0.088* 02/06/2014   FREET4 1.19 05/27/2014   FREET4 1.06 02/06/2014        Examination:   BP 148/68  Pulse 86  Temp(Src) 97.9 F (36.6 C)  Resp 14  Ht 5' 7" (1.702 m)  Wt 238 lb 3.2 oz (108.047 kg)  BMI 37.30 kg/m2  SpO2 95%  Body mass index is 37.3 kg/(m^2).   Moderate-sized goiter present  ASSESSMENT/ PLAN:  Diabetes Type 2   The patient's diabetes control is difficult to assess as she has not done readings after meals which are mostly high A1c pending from today Again discussed the need for better postprandial blood sugar control She is not always consistent diet and sometimes will have a late night snack also This is partly because of her  fear of hypoglycemia overnight  Advised her that she needs to follow that instruction given on end of visit for insulin, glucose monitoring, exercise and medications Will check her A1c today She does agree to try a half a tablet of Invokana now since it probably will help her level of control and improve her blood pressure and reduce the need for diuretics If she has no side effects can try 100 mg She will continue Victoza 1.8 mg Also discussed how to adjust her Lantus in the morning based on fasting blood sugar trend She will reduce the LANTUS dose to 40 units for now and most likely may need less insulin based on fasting blood sugar if she tries Invokana Regular walking advised again  Autonomous multinodular goiter: Recheck labs today  Hypercholesterolemia: She needs to have lipids rechecked since she has been more compliant with pravastatin  Hypertension: She is out of her Diovan and will restart this   Patient Instructions  Try 1/2 tab Invokana early am and after 1 week try 1 pill daily and may leave off Demadex  Lantus 40 units and reduce 2 more if am sugar stay <90  More sugars 2 hrs after meals  Walk daily  Check BP weekly       Counseling time over 50% of today's 25 minute visit   Charnee Turnipseed 09/09/2014, 11:00 AM

## 2014-09-11 NOTE — Telephone Encounter (Signed)
error 

## 2014-09-22 ENCOUNTER — Other Ambulatory Visit: Payer: Self-pay | Admitting: *Deleted

## 2014-09-22 ENCOUNTER — Other Ambulatory Visit: Payer: Self-pay | Admitting: Cardiovascular Disease

## 2014-09-22 ENCOUNTER — Encounter: Payer: Self-pay | Admitting: Endocrinology

## 2014-09-22 ENCOUNTER — Other Ambulatory Visit: Payer: Self-pay

## 2014-09-22 MED ORDER — METOPROLOL TARTRATE 50 MG PO TABS: 25.0000 mg | ORAL_TABLET | Freq: Two times a day (BID) | ORAL | Status: DC

## 2014-09-22 MED ORDER — LIRAGLUTIDE 18 MG/3ML ~~LOC~~ SOPN: PEN_INJECTOR | SUBCUTANEOUS | Status: DC

## 2014-09-22 MED ORDER — METFORMIN HCL 850 MG PO TABS: ORAL_TABLET | ORAL | Status: DC

## 2014-09-22 NOTE — Telephone Encounter (Signed)
Pt had only been taking lopressor 50 mg a day even though rx was written for 50 mg bid   Per Dr.Koneswaran, pt should take 25 mg bid, new rx written,pt made aware and verbalizes understanding

## 2014-09-24 ENCOUNTER — Other Ambulatory Visit: Payer: Self-pay | Admitting: *Deleted

## 2014-09-24 MED ORDER — INSULIN PEN NEEDLE 31G X 8 MM MISC
Status: DC
Start: 2014-09-24 — End: 2014-12-22

## 2014-09-26 ENCOUNTER — Other Ambulatory Visit: Payer: Self-pay | Admitting: Endocrinology

## 2014-10-09 ENCOUNTER — Telehealth: Payer: Self-pay | Admitting: Endocrinology

## 2014-10-09 ENCOUNTER — Other Ambulatory Visit: Payer: Self-pay | Admitting: *Deleted

## 2014-10-09 MED ORDER — ACCU-CHEK AVIVA PLUS W/DEVICE KIT: PACK | Status: DC

## 2014-10-09 NOTE — Telephone Encounter (Signed)
Patient need a new meter Accu check  The one she has is not working. Please advise

## 2014-10-09 NOTE — Telephone Encounter (Signed)
rx sent

## 2014-10-21 ENCOUNTER — Ambulatory Visit (INDEPENDENT_AMBULATORY_CARE_PROVIDER_SITE_OTHER): Payer: PRIVATE HEALTH INSURANCE | Admitting: Ophthalmology

## 2014-10-29 ENCOUNTER — Ambulatory Visit (INDEPENDENT_AMBULATORY_CARE_PROVIDER_SITE_OTHER): Payer: PRIVATE HEALTH INSURANCE | Admitting: Ophthalmology

## 2014-11-07 ENCOUNTER — Ambulatory Visit (INDEPENDENT_AMBULATORY_CARE_PROVIDER_SITE_OTHER): Payer: PRIVATE HEALTH INSURANCE | Admitting: Ophthalmology

## 2014-11-07 DIAGNOSIS — H35373 Puckering of macula, bilateral: Secondary | ICD-10-CM | POA: Diagnosis not present

## 2014-11-07 DIAGNOSIS — H43812 Vitreous degeneration, left eye: Secondary | ICD-10-CM | POA: Diagnosis not present

## 2014-11-07 DIAGNOSIS — H35033 Hypertensive retinopathy, bilateral: Secondary | ICD-10-CM | POA: Diagnosis not present

## 2014-11-07 DIAGNOSIS — E10311 Type 1 diabetes mellitus with unspecified diabetic retinopathy with macular edema: Secondary | ICD-10-CM

## 2014-11-07 DIAGNOSIS — I10 Essential (primary) hypertension: Secondary | ICD-10-CM

## 2014-11-07 DIAGNOSIS — E10351 Type 1 diabetes mellitus with proliferative diabetic retinopathy with macular edema: Secondary | ICD-10-CM

## 2014-11-19 ENCOUNTER — Other Ambulatory Visit: Payer: Self-pay | Admitting: Cardiovascular Disease

## 2014-11-19 ENCOUNTER — Other Ambulatory Visit: Payer: Self-pay | Admitting: Endocrinology

## 2014-11-26 ENCOUNTER — Other Ambulatory Visit: Payer: Self-pay | Admitting: *Deleted

## 2014-11-26 MED ORDER — GLUCOSE BLOOD VI STRP: ORAL_STRIP | Status: DC

## 2014-12-10 ENCOUNTER — Other Ambulatory Visit: Payer: Medicare Other

## 2014-12-10 ENCOUNTER — Ambulatory Visit: Payer: Medicare Other | Admitting: Endocrinology

## 2014-12-22 ENCOUNTER — Other Ambulatory Visit: Payer: Self-pay | Admitting: *Deleted

## 2014-12-22 ENCOUNTER — Other Ambulatory Visit: Payer: Self-pay | Admitting: Cardiovascular Disease

## 2014-12-22 ENCOUNTER — Other Ambulatory Visit: Payer: Self-pay | Admitting: Endocrinology

## 2014-12-22 MED ORDER — INSULIN LISPRO 100 UNIT/ML (KWIKPEN): PEN_INJECTOR | SUBCUTANEOUS | Status: DC

## 2014-12-25 ENCOUNTER — Encounter: Payer: Self-pay | Admitting: Endocrinology

## 2014-12-25 ENCOUNTER — Ambulatory Visit (INDEPENDENT_AMBULATORY_CARE_PROVIDER_SITE_OTHER): Payer: Medicare Other | Admitting: Endocrinology

## 2014-12-25 ENCOUNTER — Other Ambulatory Visit (INDEPENDENT_AMBULATORY_CARE_PROVIDER_SITE_OTHER): Payer: Medicare Other

## 2014-12-25 VITALS — BP 131/69 | HR 84 | Temp 98.1°F | Resp 14 | Ht 67.0 in | Wt 238.8 lb

## 2014-12-25 DIAGNOSIS — I10 Essential (primary) hypertension: Secondary | ICD-10-CM

## 2014-12-25 DIAGNOSIS — E040 Nontoxic diffuse goiter: Secondary | ICD-10-CM

## 2014-12-25 DIAGNOSIS — IMO0002 Reserved for concepts with insufficient information to code with codable children: Secondary | ICD-10-CM

## 2014-12-25 DIAGNOSIS — E1165 Type 2 diabetes mellitus with hyperglycemia: Secondary | ICD-10-CM

## 2014-12-25 DIAGNOSIS — E042 Nontoxic multinodular goiter: Secondary | ICD-10-CM

## 2014-12-25 LAB — COMPREHENSIVE METABOLIC PANEL
ALK PHOS: 96 U/L (ref 39–117)
ALT: 10 U/L (ref 0–35)
AST: 11 U/L (ref 0–37)
Albumin: 3.9 g/dL (ref 3.5–5.2)
BUN: 21 mg/dL (ref 6–23)
CALCIUM: 9.5 mg/dL (ref 8.4–10.5)
CO2: 27 meq/L (ref 19–32)
CREATININE: 1.22 mg/dL — AB (ref 0.40–1.20)
Chloride: 104 mEq/L (ref 96–112)
GFR: 56.43 mL/min — ABNORMAL LOW (ref >60.00)
Glucose, Bld: 245 mg/dL — ABNORMAL HIGH (ref 70–99)
POTASSIUM: 4.6 meq/L (ref 3.5–5.1)
Sodium: 137 mEq/L (ref 135–145)
Total Bilirubin: 0.4 mg/dL (ref 0.2–1.2)
Total Protein: 7.3 g/dL (ref 6.0–8.3)

## 2014-12-25 LAB — HEMOGLOBIN A1C: HEMOGLOBIN A1C: 8.6 % — AB (ref 4.6–6.5)

## 2014-12-25 NOTE — Progress Notes (Signed)
Patient ID: Brittney [name], female   DOB: 02/16/1946, 69 y.o.   MRN: [id number]   Reason for Appointment: Followup of diabetes  History of Present Illness    Type 2 DIABETES MELITUS, date of diagnosis: 1976      She has had long-standing diabetes taking insulin. Because of her difficulties with compliance using mealtime insulin she was given in [place] in 2011 and subsequently Victoza in addition to her Lantus and this helped her control However she tends to be erratic with her day-to-day care and has not had adequate A1c levels in the past, usually over 7%  RECENT history:  Her blood sugars are difficult to interpret on this visit because of her meter having the incorrect date and time She is still taking her blood sugars only in the morning generally and rarely after meals Despite consistent instructions on her starting to take NovoLog with every meal she is doing this very sporadically and not clear when she decides to take it. Previously was taking NovoLog before supper and now is taking it mostly before lunch This is despite not having any hypoglycemia Her A1c has been over 8% the last 2 times. She was told to wait try Invokana but she did not do so because of fear of side effects  Current glucose patterns and problems:  She has relatively good readings in the mornings without hypoglycemia but occasionally they are higher  She does appear to have periodic high readings after breakfast but not clear if she is checking blood sugars after supper  Most likely her readings before supper are fairly good  Her overall blood sugar average at home is 130  Recently not doing any exercise She is taking Victoza with the dose of 1.8 mg    Hypoglycemia: None recently  Oral hypoglycemic drugs: Metformin        Side effects from medications:  Invokana:? Headache Insulin regimen: Lantus 44 units daily in a.m., NovoLog 6 units at times before mostly lunch      Monitors blood glucose: Once a  day.    Glucometer: Accucheck         Blood Glucose readings from meter download: Checking blood sugars mostly in the mornings recently ranging from 95-182 Difficult to interpret her download as her monitor is at least 5 hours ahead of time  Meals: 2-3 meals per day. Usually light or no lunch. Dinner 7-8 pm Does not have protein with breakfast consistently           Physical activity: exercise: was walking 45-60 min, 3-5/7            Wt Readings from Last 3 Encounters:  12/25/14 238 lb 12.8 oz (108.319 kg)  09/09/14 238 lb 3.2 oz (108.047 kg)  08/13/14 239 lb (108.41 kg)   GLYCEMIC CONTROL:  Lab Results  Component Value Date   HGBA1C 8.1* 09/09/2014   HGBA1C 8.4* 05/27/2014   HGBA1C 7.5* 10/11/2013   Lab Results  Component Value Date   MICROALBUR 0.1 09/09/2014   LDLCALC 75 09/09/2014   CREATININE 1.3* 09/09/2014           Medication List       This list is accurate as of: 12/25/14  2:52 PM.  Always use your most recent med list.               ACCU-CHEK AVIVA PLUS W/DEVICE Kit  Use to check blood sugar 2 times per day dx code E11.65     ALPRAZolam  0.25 MG tablet  Commonly known as:  XANAX  Take 0.25 mg by mouth at bedtime as needed.     amLODipine 10 MG tablet  Commonly known as:  NORVASC  TAKE (1) TABLET BY MOUTH ONCE DAILY.     B-D ULTRAFINE III SHORT PEN 31G X 8 MM Misc  Generic drug:  Insulin Pen Needle  USE AS DIRECTED 2-3 TIMES A DAY.     glucose blood test strip  Commonly known as:  ACCU-CHEK AVIVA PLUS  USE TO TEST TWICE DAILY. Dx code E11.65     insulin aspart 100 UNIT/ML FlexPen  Commonly known as:  NOVOLOG  Inject 6 Units into the skin as needed. Inject 6-7 units before supper if glucose is high     insulin lispro 100 UNIT/ML KiwkPen  Commonly known as:  HUMALOG KWIKPEN  Inject 6-7 units before supper if sugar is high     LANTUS SOLOSTAR 100 UNIT/ML Solostar Pen  Generic drug:  Insulin Glargine  INJECT 44 UNITS SUBCUTANEOUSLY ONCE EVERY  MORNING.     Liraglutide 18 MG/3ML Sopn  Commonly known as:  VICTOZA  Inject 1.8 mg daily     metFORMIN 850 MG tablet  Commonly known as:  GLUCOPHAGE  TAKE (1) TABLET BY MOUTH TWICE DAILY.     metoprolol 50 MG tablet  Commonly known as:  LOPRESSOR  TAKE 0.5 (ONE-HALF) TABLET BY MOUTH TWICE DAILY.     omeprazole 20 MG capsule  Commonly known as:  PRILOSEC  Take 20 mg by mouth daily.     onetouch ultrasoft lancets     polyethylene glycol powder powder  Commonly known as:  GLYCOLAX/MIRALAX  Take 1 Container by mouth as needed for moderate constipation.     pravastatin 20 MG tablet  Commonly known as:  PRAVACHOL  TAKE ONE TABLET BY MOUTH DAILY.     torsemide 20 MG tablet  Commonly known as:  DEMADEX  TAKE 1 TABLET BY MOUTH ONCE DAILY AS NEEDED.     valsartan 320 MG tablet  Commonly known as:  DIOVAN  Take 320 mg by mouth daily.        Allergies:  Allergies  Allergen Reactions  . Latex     Itching and breaking out    Past Medical History  Diagnosis Date  . Atrial fibrillation     Paroxysmal; LVH; nl EF; onset in 1999  . Hypertension   . Diabetes mellitus     insulin; managed by Dr. Dwyane Dee  . Hyperlipidemia   . Palpitations     negative event recorder in 2009  . Hypothyroidism     history of goiter; nl TSH off medication  . 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 Obesity 07/06/2012    Past Surgical History  Procedure Laterality Date  . Tubal ligation  1980's  . Cholecystectomy  11/95  . Retinal detachment surgery  2009  . Colonoscopy  2007  . Cesarean section    . Biopsy thyroid      Family History  Problem Relation Age of Onset  . Diabetes Mother   . Diabetes Daughter     gestational diabetes    Social History:  reports that she quit smoking about 28 years ago. Her smoking use included Cigarettes. She smoked 1.00 pack per day. She has never used smokeless tobacco. She reports that she does not drink alcohol or use illicit  drugs.  Review of Systems:   HYPERTENSION:  this is  well controlled with current regimen   HYPERLIPIDEMIA: The lipid abnormality consists of elevated LDL; she has been off her Pravachol.  She now sees that it makes her leg cramps worse although on her last visit she was thinking it was from her thyroid She has not taken any for 2 months   Lab Results  Component Value Date   CHOL 158 09/09/2014   HDL 58.80 09/09/2014   LDLCALC 75 09/09/2014   TRIG 120.0 09/09/2014   CHOLHDL 3 09/09/2014    Multinodular goiter: She has had a long-standing multinodular goiter since at least 2005 which has been fairly stable clinically She does have history of occasional palpitations for years but has been treated with metoprolol by her cardiologist for this Her goiter has been autonomous. No local pressure symptoms of choking or difficulty swallowing She does complain of occasional palpitations but no heat intolerance  No evidence of overt hyperthyroidism  with normal free T4 and free T3 levels; TSH has been consistently suppressed Her I-131 uptake in 2003 was 17% and her  thyroid ultrasound in 2014 did not show distinct nodules but a relative increase in size  Lab Results  Component Value Date   TSH 0.026* 05/27/2014   TSH 0.088* 02/06/2014   FREET4 1.01 09/09/2014   FREET4 1.19 05/27/2014        Examination:   BP 131/69 mmHg  Pulse 84  Temp(Src) 98.1 F (36.7 C)  Resp 14  Ht 5' 7" (1.702 m)  Wt 238 lb 12.8 oz (108.319 kg)  BMI 37.39 kg/m2  SpO2 95%  Body mass index is 37.39 kg/(m^2).    ASSESSMENT/ PLAN:  Diabetes Type 2   The patient's diabetes control is difficult to assess as she has  the wrong date and time on her monitor and most of the readings are done in the mornings before breakfast She tends to have persistently high A1c over 8% likely to be from postprandial hyperglycemia  She was told to start taking blood sugars after different meals by rotation and she will keep  a blood sugar diary for her reference She was instructed to start taking insulin with a meal that tend to raise her blood sugars She will continue Victoza and Lantus unchanged Regular walking advised again   Hypercholesterolemia: She needs to retry pravastatin every other day.  She is not wanting to try other medications  Hypertension:  Better controlled today   Patient Instructions  Please check blood sugars at least half the time about 2 hours after any meal and 3 times per week on waking up. Please bring blood sugar monitor to each visit. Recommended blood sugar levels about 2 hours after meal is 140-180 and on waking up 90-130  Take Pravastatin at bedtime  Start exercise    Counseling time over 50% of today's 25 minute visit   KUMAR,AJAY 12/25/2014, 2:52 PM

## 2014-12-25 NOTE — Patient Instructions (Addendum)
Please check blood sugars at least half the time about 2 hours after any meal and 3 times per week on waking up. Please bring blood sugar monitor to each visit. Recommended blood sugar levels about 2 hours after meal is 140-180 and on waking up 90-130  Take Pravastatin at bedtime  Start exercise

## 2014-12-28 NOTE — Progress Notes (Signed)
Quick Note:  Please let patient know that A1c/diabetes control is worse, needs to take Novolog consistently as directed ______

## 2014-12-29 DIAGNOSIS — E119 Type 2 diabetes mellitus without complications: Secondary | ICD-10-CM | POA: Diagnosis not present

## 2014-12-29 DIAGNOSIS — I10 Essential (primary) hypertension: Secondary | ICD-10-CM | POA: Diagnosis not present

## 2015-01-21 ENCOUNTER — Other Ambulatory Visit: Payer: Self-pay | Admitting: Cardiovascular Disease

## 2015-01-29 DIAGNOSIS — L84 Corns and callosities: Secondary | ICD-10-CM | POA: Diagnosis not present

## 2015-01-29 DIAGNOSIS — E1151 Type 2 diabetes mellitus with diabetic peripheral angiopathy without gangrene: Secondary | ICD-10-CM | POA: Diagnosis not present

## 2015-01-29 DIAGNOSIS — L603 Nail dystrophy: Secondary | ICD-10-CM | POA: Diagnosis not present

## 2015-01-29 DIAGNOSIS — B351 Tinea unguium: Secondary | ICD-10-CM | POA: Diagnosis not present

## 2015-01-29 DIAGNOSIS — M79609 Pain in unspecified limb: Secondary | ICD-10-CM | POA: Diagnosis not present

## 2015-01-29 DIAGNOSIS — I739 Peripheral vascular disease, unspecified: Secondary | ICD-10-CM | POA: Diagnosis not present

## 2015-02-11 DIAGNOSIS — Z0000 Encounter for general adult medical examination without abnormal findings: Secondary | ICD-10-CM | POA: Diagnosis not present

## 2015-02-16 ENCOUNTER — Telehealth: Payer: Self-pay | Admitting: Endocrinology

## 2015-02-16 ENCOUNTER — Other Ambulatory Visit: Payer: Self-pay | Admitting: *Deleted

## 2015-02-16 MED ORDER — LIRAGLUTIDE 18 MG/3ML ~~LOC~~ SOPN: PEN_INJECTOR | SUBCUTANEOUS | Status: DC

## 2015-02-16 NOTE — Telephone Encounter (Signed)
Prescription Refill or Medication Request (non symptomatic) Initial Comment Caller states she is completely out of Victoza and would like on call to call it in to [place] [phone]. Patient states she is taking Victoza 1.8mg  pen daily

## 2015-02-16 NOTE — Telephone Encounter (Signed)
Rx was sent in this morning

## 2015-02-27 ENCOUNTER — Encounter: Payer: Self-pay | Admitting: Adult Health

## 2015-02-27 ENCOUNTER — Ambulatory Visit (INDEPENDENT_AMBULATORY_CARE_PROVIDER_SITE_OTHER): Payer: Medicare Other | Admitting: Adult Health

## 2015-02-27 VITALS — BP 158/68 | HR 80 | Ht 67.0 in | Wt 237.0 lb

## 2015-02-27 DIAGNOSIS — L299 Pruritus, unspecified: Secondary | ICD-10-CM

## 2015-02-27 HISTORY — DX: Pruritus, unspecified: L29.9

## 2015-02-27 MED ORDER — TRIAMCINOLONE ACETONIDE 0.5 % EX OINT: 1.0000 "application " | TOPICAL_OINTMENT | Freq: Two times a day (BID) | CUTANEOUS | Status: DC

## 2015-02-27 MED ORDER — NYSTATIN 100000 UNIT/GM EX CREA: 1.0000 "application " | TOPICAL_CREAM | Freq: Two times a day (BID) | CUTANEOUS | Status: DC

## 2015-02-27 NOTE — Patient Instructions (Signed)
Use cream 2-3 x daily Stop shower gel

## 2015-02-27 NOTE — Progress Notes (Signed)
Subjective:     Patient ID: Brittney [name], female   DOB: Sep 15, 1946, 69 y.o.   MRN: [id number]  HPI Brittney [name] is a 69 year old black female complaining of itching around C- section scar, for a couple of weeks.Has used new body wash.Tried witch hazel without relief.  Review of Systems +skin itching All other systems negative Reviewed past medical,surgical, social and family history. Reviewed medications and allergies.     Objective:   Physical Exam BP 158/68 mmHg  Pulse 80  Ht 5\' 7"  (1.702 m)  Wt 237 lb (107.502 kg)  BMI 37.11 kg/m2   Has skin color changes around C section scar, and thickness of skin,discussed stopping body wash and use bar soap, pat dry, can use cool hair dryer, if needed.  Assessment:     Itching with irritation     Plan:   Rx nystatin cream use 2-3 x daily prn Rx triamcinolone ointment 0.5% use 2-3 x daily prn Stop shower gel, keep clean and dry   Follow up prn

## 2015-03-20 ENCOUNTER — Other Ambulatory Visit: Payer: Self-pay | Admitting: *Deleted

## 2015-03-20 MED ORDER — INSULIN GLARGINE 100 UNIT/ML SOLOSTAR PEN: PEN_INJECTOR | SUBCUTANEOUS | Status: DC

## 2015-03-25 ENCOUNTER — Ambulatory Visit: Payer: Medicare Other | Admitting: Endocrinology

## 2015-03-30 DIAGNOSIS — E118 Type 2 diabetes mellitus with unspecified complications: Secondary | ICD-10-CM | POA: Diagnosis not present

## 2015-03-30 DIAGNOSIS — I10 Essential (primary) hypertension: Secondary | ICD-10-CM | POA: Diagnosis not present

## 2015-03-30 DIAGNOSIS — E785 Hyperlipidemia, unspecified: Secondary | ICD-10-CM | POA: Diagnosis not present

## 2015-04-17 ENCOUNTER — Ambulatory Visit: Payer: Medicare Other | Admitting: Endocrinology

## 2015-04-22 ENCOUNTER — Other Ambulatory Visit: Payer: Self-pay | Admitting: *Deleted

## 2015-04-22 MED ORDER — PRAVASTATIN SODIUM 20 MG PO TABS: 20.0000 mg | ORAL_TABLET | Freq: Every day | ORAL | Status: DC

## 2015-04-27 DIAGNOSIS — L603 Nail dystrophy: Secondary | ICD-10-CM | POA: Diagnosis not present

## 2015-04-27 DIAGNOSIS — E1051 Type 1 diabetes mellitus with diabetic peripheral angiopathy without gangrene: Secondary | ICD-10-CM | POA: Diagnosis not present

## 2015-04-27 DIAGNOSIS — I739 Peripheral vascular disease, unspecified: Secondary | ICD-10-CM | POA: Diagnosis not present

## 2015-05-06 ENCOUNTER — Encounter: Payer: Self-pay | Admitting: Endocrinology

## 2015-05-06 ENCOUNTER — Ambulatory Visit (INDEPENDENT_AMBULATORY_CARE_PROVIDER_SITE_OTHER): Payer: Medicare Other | Admitting: Endocrinology

## 2015-05-06 VITALS — BP 118/66 | HR 73 | Temp 98.3°F | Resp 16 | Wt 248.0 lb

## 2015-05-06 DIAGNOSIS — E042 Nontoxic multinodular goiter: Secondary | ICD-10-CM | POA: Diagnosis not present

## 2015-05-06 DIAGNOSIS — I10 Essential (primary) hypertension: Secondary | ICD-10-CM | POA: Diagnosis not present

## 2015-05-06 DIAGNOSIS — Z23 Encounter for immunization: Secondary | ICD-10-CM

## 2015-05-06 DIAGNOSIS — E040 Nontoxic diffuse goiter: Secondary | ICD-10-CM

## 2015-05-06 DIAGNOSIS — E1165 Type 2 diabetes mellitus with hyperglycemia: Secondary | ICD-10-CM | POA: Diagnosis not present

## 2015-05-06 DIAGNOSIS — IMO0002 Reserved for concepts with insufficient information to code with codable children: Secondary | ICD-10-CM

## 2015-05-06 LAB — BASIC METABOLIC PANEL
BUN: 19 mg/dL (ref 6–23)
CHLORIDE: 102 meq/L (ref 96–112)
CO2: 29 mEq/L (ref 19–32)
CREATININE: 1.06 mg/dL (ref 0.40–1.20)
Calcium: 9.7 mg/dL (ref 8.4–10.5)
GFR: 66.3 mL/min (ref >60.00)
Glucose, Bld: 205 mg/dL — ABNORMAL HIGH (ref 70–99)
Potassium: 4.4 mEq/L (ref 3.5–5.1)
Sodium: 135 mEq/L (ref 135–145)

## 2015-05-06 LAB — LIPID PANEL
CHOL/HDL RATIO: 3
Cholesterol: 190 mg/dL (ref 0–200)
HDL: 56.1 mg/dL (ref >39.00)
LDL Cholesterol: 99 mg/dL (ref 0–99)
NONHDL: 133.9
Triglycerides: 173 mg/dL — ABNORMAL HIGH (ref 0.0–149.0)
VLDL: 34.6 mg/dL (ref 0.0–40.0)

## 2015-05-06 LAB — HEMOGLOBIN A1C: Hgb A1c MFr Bld: 8.2 % — ABNORMAL HIGH (ref 4.6–6.5)

## 2015-05-06 MED ORDER — DULOXETINE HCL 30 MG PO CPEP: 30.0000 mg | ORAL_CAPSULE | Freq: Every day | ORAL | Status: DC

## 2015-05-06 NOTE — Progress Notes (Signed)
Patient ID: Brittney [name], female   DOB: [birth year], 69 y.o.   MRN: [id number]   Reason for Appointment: Followup of diabetes  History of Present Illness    Type 2 DIABETES MELITUS, date of diagnosis: 1976      She has had long-standing diabetes taking insulin. Because of her difficulties with compliance using mealtime insulin she was given in [place] in 2011 and subsequently Victoza in addition to her Lantus and this helped her control However she tends to be erratic with her day-to-day care and has not had adequate A1c levels in the past, usually over 7%  RECENT history:   Insulin regimen: Lantus 44 units daily in a.m., NovoLog 6 units at times before mostly lunch  Her A1c has been consistently over 8% Although she is trying to check her blood sugars more often and keeping a diary of blood sugars are surprisingly near normal all the time even though on the previous visits she would have significant fluctuation of her blood sugars after meals Did not bring her blood sugar monitor today  Current glucose patterns and problems:  She has relatively good readings in the mornings with sometimes low normal readings although without   She is not checking her blood sugars 2 hours after meals but only right before eating and none after supper  Recently not doing any exercise because of leg pain and cramps  She only takes Novolog before lunch even though she may have a significant amount of carbohydrate at breakfast and suppertime also She is taking Victoza with the dose of 1.8 mg    Hypoglycemia: None recently  Oral hypoglycemic drugs: Metformin        Side effects from medications:  Invokana:? Headache      Monitors blood glucose: 3 a day.    Glucometer: Accucheck         Blood Glucose readings from meter download: Checking blood sugars mostly in the mornings recently ranging from 95-182  Mean values apply above for all meters except median for One Touch  PRE-MEAL Fasting  Lunch Dinner Bedtime Overall  Glucose range: 70-144  84-177 77-140   Mean/median:         Meals: 2-3 meals per day. . Dinner 7-8 pm Does not have protein with breakfast consistently           Physical activity: exercise: not walking           Wt Readings from Last 3 Encounters:  05/06/15 248 lb (112.492 kg)  02/27/15 237 lb (107.502 kg)  12/25/14 238 lb 12.8 oz (108.319 kg)   GLYCEMIC CONTROL:  Lab Results  Component Value Date   HGBA1C 8.2* 05/06/2015   HGBA1C 8.6* 12/25/2014   HGBA1C 8.1* 09/09/2014   Lab Results  Component Value Date   MICROALBUR 0.1 09/09/2014   Ronald 99 05/06/2015   CREATININE 1.06 05/06/2015           Medication List       This list is accurate as of: 05/06/15  2:59 PM.  Always use your most recent med list.               ACCU-CHEK AVIVA PLUS W/DEVICE Kit  Use to check blood sugar 2 times per day dx code E11.65     ALPRAZolam 0.25 MG tablet  Commonly known as:  XANAX  Take 0.25 mg by mouth at bedtime as needed.     amLODipine 10 MG tablet  Commonly known as:  NORVASC  TAKE (1) TABLET BY MOUTH ONCE DAILY.     B-D ULTRAFINE III SHORT PEN 31G X 8 MM Misc  Generic drug:  Insulin Pen Needle  USE AS DIRECTED 2-3 TIMES A DAY.     DULoxetine 30 MG capsule  Commonly known as:  CYMBALTA  Take 1 capsule (30 mg total) by mouth daily.     glucose blood test strip  Commonly known as:  ACCU-CHEK AVIVA PLUS  USE TO TEST TWICE DAILY. Dx code E11.65     Insulin Glargine 100 UNIT/ML Solostar Pen  Commonly known as:  LANTUS SOLOSTAR  INJECT 39 UNITS SUBCUTANEOUSLY ONCE EVERY MORNING.     insulin lispro 100 UNIT/ML KiwkPen  Commonly known as:  HUMALOG KWIKPEN  Inject 6-7 units before supper if sugar is high     Liraglutide 18 MG/3ML Sopn  Commonly known as:  VICTOZA  Inject 1.8 mg daily     metFORMIN 850 MG tablet  Commonly known as:  GLUCOPHAGE  TAKE (1) TABLET BY MOUTH TWICE DAILY.     metoprolol 50 MG tablet  Commonly known as:   LOPRESSOR  TAKE 0.5 (ONE-HALF) TABLET BY MOUTH TWICE DAILY.     nystatin cream  Commonly known as:  MYCOSTATIN  Apply 1 application topically 2 (two) times daily.     omeprazole 20 MG capsule  Commonly known as:  PRILOSEC  Take 20 mg by mouth daily.     onetouch ultrasoft lancets     polyethylene glycol powder powder  Commonly known as:  GLYCOLAX/MIRALAX  Take 1 Container by mouth as needed for moderate constipation.     potassium chloride 10 MEQ tablet  Commonly known as:  K-DUR  10 mEq. Daily to every other day.     pravastatin 20 MG tablet  Commonly known as:  PRAVACHOL  Take 1 tablet (20 mg total) by mouth daily.     torsemide 20 MG tablet  Commonly known as:  DEMADEX  TAKE 1 TABLET BY MOUTH ONCE DAILY AS NEEDED.     triamcinolone ointment 0.5 %  Commonly known as:  KENALOG  Apply 1 application topically 2 (two) times daily.     valsartan 320 MG tablet  Commonly known as:  DIOVAN  Take 320 mg by mouth daily.        Allergies:  Allergies  Allergen Reactions  . Latex     Itching and breaking out    Past Medical History  Diagnosis Date  . Atrial fibrillation     Paroxysmal; LVH; nl EF; onset in 1999  . Hypertension   . Diabetes mellitus     insulin; managed by Dr. Dwyane Dee  . Hyperlipidemia   . Palpitations     negative event recorder in 2009  . Hypothyroidism     history of goiter; nl TSH off medication  . 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 Obesity 07/06/2012  . Itching with irritation 02/27/2015    Past Surgical History  Procedure Laterality Date  . Tubal ligation  1980's  . Cholecystectomy  11/95  . Retinal detachment surgery  2009  . Colonoscopy  2007  . Cesarean section    . Biopsy thyroid      Family History  Problem Relation Age of Onset  . Diabetes Mother   . Diabetes Daughter     gestational diabetes  . Cancer Father   . Thyroid disease Brother   . Other Son     MVA  Social History:  reports that she  quit smoking about 28 years ago. Her smoking use included Cigarettes. She smoked 1.00 pack per day. She has never used smokeless tobacco. She reports that she does not drink alcohol or use illicit drugs.  Review of Systems:  She is complaining about pain in her lower legs starting from the knees and going down but not as much in her feet.  Does not complain of any tingling or numbness and the pain is there throughout the day and not in his early worse on walking.  Line she thinks she was given gabapentin at one time for this but they have stopped this possibly from side effects The last 40 exam did not show any sensory loss  HYPERTENSION:  this is well controlled with current regimen, no lightheadedness  HYPERLIPIDEMIA: The lipid abnormality consists of elevated LDL; she has been taking her Pravachol irregularly   Lab Results  Component Value Date   CHOL 190 05/06/2015   HDL 56.10 05/06/2015   LDLCALC 99 05/06/2015   TRIG 173.0* 05/06/2015   CHOLHDL 3 05/06/2015    Multinodular goiter: She has had a long-standing multinodular goiter since at least 2005 which has been fairly stable clinically She does have history of occasional palpitations for years but has been treated with metoprolol by her cardiologist for this Her goiter has been autonomous. No local pressure symptoms of choking or difficulty swallowing She does complain of occasional palpitations but no heat intolerance  No evidence of overt hyperthyroidism  with normal free T4 and free T3 levels; TSH has been consistently suppressed Her I-131 uptake in 2003 was 17% and her  thyroid ultrasound in 2014 did not show distinct nodules but a relative increase in size  Lab Results  Component Value Date   TSH 0.026* 05/27/2014   TSH 0.088* 02/06/2014   FREET4 1.01 09/09/2014   FREET4 1.19 05/27/2014       Examination:   BP 118/66 mmHg  Pulse 73  Temp(Src) 98.3 F (36.8 C) (Oral)  Resp 16  Wt 248 lb (112.492 kg)  SpO2 98%   Body mass index is 38.83 kg/(m^2).   She has mild edema of her feet but not lower legs  ASSESSMENT/ PLAN:  Diabetes Type 2   The patient's diabetes control is difficult to assess as she has not brought her monitor for review; she is keeping a diary of her blood sugars but not clear if she is putting down the right readings since most of her low blood sugars are looking normal This is even without taking any mealtime coverage for breakfast and supper Fasting blood sugars are low normal at times Also she has gained a significant amount of weight and not clear why; she continues to take Victoza  Will check her A1c and decide on further management Meanwhile will reduce her Lantus to 40 units Consider consultation with dietitian for meal planning and weight loss Regular walking advised again Discussed that she does need to check her blood sugars about 2 hours after meals to better assess the need for Novolog especially after supper  LEG pain: This may well be from neuropathy and she will restart on Cymbalta since she reportedly had some side effects from gabapentin 100 mg  Hypercholesterolemia: Lipids to be assessed  Hypertension:  Better controlled today  Preventive care: She has not had a pneumonia vaccine and will give her Prevnar and forward information to PCP  Patient Instructions  Check blood sugars on waking up .Marland Kitchen  3-4  .. times a week Also check blood sugars about 2 hours after a meal and do this after different meals by rotation  Recommended blood sugar levels on waking up is 90-130 and about 2 hours after meal is 140-180 Please bring blood sugar monitor to each visit.  lantus 40 units  Cymbalta with food 1x daily  Magnesium otc daily   Counseling time over 50% of today's 25 minute visit   Diangelo Radel 05/06/2015, 2:59 PM

## 2015-05-06 NOTE — Patient Instructions (Addendum)
Check blood sugars on waking up .Marland Kitchen3-4  .Marland Kitchen times a week Also check blood sugars about 2 hours after a meal and do this after different meals by rotation  Recommended blood sugar levels on waking up is 90-130 and about 2 hours after meal is 140-180 Please bring blood sugar monitor to each visit.  lantus 40 units  Cymbalta with food 1x daily  Magnesium otc daily

## 2015-05-06 NOTE — Progress Notes (Signed)
Quick Note:  Please let patient know that the A1c indicates poor control and her sugar was 205. She probably needs to take Novolog 4 units before breakfast also and check readings about 2 hours after eating rather than before the meal Please fax labs to PCP ______

## 2015-05-09 ENCOUNTER — Encounter (HOSPITAL_COMMUNITY): Payer: Self-pay | Admitting: Emergency Medicine

## 2015-05-09 ENCOUNTER — Emergency Department (HOSPITAL_COMMUNITY)
Admission: EM | Admit: 2015-05-09 | Discharge: 2015-05-09 | Disposition: A | Discharge status: Home / Self Care | Payer: Medicare Other | Attending: Emergency Medicine | Admitting: Emergency Medicine

## 2015-05-09 ENCOUNTER — Other Ambulatory Visit (HOSPITAL_COMMUNITY): Payer: Self-pay | Admitting: Emergency Medicine

## 2015-05-09 DIAGNOSIS — E669 Obesity, unspecified: Secondary | ICD-10-CM | POA: Diagnosis not present

## 2015-05-09 DIAGNOSIS — Z91040 Latex allergy status: Secondary | ICD-10-CM | POA: Diagnosis not present

## 2015-05-09 DIAGNOSIS — Z7952 Long term (current) use of systemic steroids: Secondary | ICD-10-CM | POA: Diagnosis not present

## 2015-05-09 DIAGNOSIS — Z87891 Personal history of nicotine dependence: Secondary | ICD-10-CM | POA: Diagnosis not present

## 2015-05-09 DIAGNOSIS — S86911A Strain of unspecified muscle(s) and tendon(s) at lower leg level, right leg, initial encounter: Secondary | ICD-10-CM | POA: Insufficient documentation

## 2015-05-09 DIAGNOSIS — Z79899 Other long term (current) drug therapy: Secondary | ICD-10-CM | POA: Insufficient documentation

## 2015-05-09 DIAGNOSIS — E11610 Type 2 diabetes mellitus with diabetic neuropathic arthropathy: Secondary | ICD-10-CM | POA: Insufficient documentation

## 2015-05-09 DIAGNOSIS — Y929 Unspecified place or not applicable: Secondary | ICD-10-CM | POA: Diagnosis not present

## 2015-05-09 DIAGNOSIS — E785 Hyperlipidemia, unspecified: Secondary | ICD-10-CM | POA: Diagnosis not present

## 2015-05-09 DIAGNOSIS — Y939 Activity, unspecified: Secondary | ICD-10-CM | POA: Diagnosis not present

## 2015-05-09 DIAGNOSIS — M79604 Pain in right leg: Secondary | ICD-10-CM

## 2015-05-09 DIAGNOSIS — I480 Paroxysmal atrial fibrillation: Secondary | ICD-10-CM | POA: Insufficient documentation

## 2015-05-09 DIAGNOSIS — Z794 Long term (current) use of insulin: Secondary | ICD-10-CM | POA: Insufficient documentation

## 2015-05-09 DIAGNOSIS — R609 Edema, unspecified: Secondary | ICD-10-CM

## 2015-05-09 DIAGNOSIS — Y999 Unspecified external cause status: Secondary | ICD-10-CM | POA: Diagnosis not present

## 2015-05-09 DIAGNOSIS — I10 Essential (primary) hypertension: Secondary | ICD-10-CM | POA: Diagnosis not present

## 2015-05-09 DIAGNOSIS — X58XXXA Exposure to other specified factors, initial encounter: Secondary | ICD-10-CM | POA: Diagnosis not present

## 2015-05-09 DIAGNOSIS — Z872 Personal history of diseases of the skin and subcutaneous tissue: Secondary | ICD-10-CM | POA: Insufficient documentation

## 2015-05-09 DIAGNOSIS — M7989 Other specified soft tissue disorders: Secondary | ICD-10-CM | POA: Diagnosis present

## 2015-05-09 DIAGNOSIS — T148XXA Other injury of unspecified body region, initial encounter: Secondary | ICD-10-CM

## 2015-05-09 LAB — CBC WITH DIFFERENTIAL/PLATELET
BASOS ABS: 0 10*3/uL (ref 0.0–0.1)
Basophils Relative: 0 % (ref 0–1)
EOS ABS: 0.2 10*3/uL (ref 0.0–0.7)
Eosinophils Relative: 2 % (ref 0–5)
HEMATOCRIT: 33.2 % — AB (ref 36.0–46.0)
Hemoglobin: 10.7 g/dL — ABNORMAL LOW (ref 12.0–15.0)
Lymphocytes Relative: 30 % (ref 12–46)
Lymphs Abs: 2.2 10*3/uL (ref 0.7–4.0)
MCH: 27.2 pg (ref 26.0–34.0)
MCHC: 32.2 g/dL (ref 30.0–36.0)
MCV: 84.3 fL (ref 78.0–100.0)
MONO ABS: 0.3 10*3/uL (ref 0.1–1.0)
Monocytes Relative: 4 % (ref 3–12)
Neutro Abs: 4.6 10*3/uL (ref 1.7–7.7)
Neutrophils Relative %: 64 % (ref 43–77)
PLATELETS: 283 10*3/uL (ref 150–400)
RBC: 3.94 MIL/uL (ref 3.87–5.11)
RDW: 13.7 % (ref 11.5–15.5)
WBC: 7.2 10*3/uL (ref 4.0–10.5)

## 2015-05-09 LAB — COMPREHENSIVE METABOLIC PANEL
ALK PHOS: 85 U/L (ref 38–126)
ALT: 13 U/L — ABNORMAL LOW (ref 14–54)
ANION GAP: 9 (ref 5–15)
AST: 19 U/L (ref 15–41)
Albumin: 3.7 g/dL (ref 3.5–5.0)
BILIRUBIN TOTAL: 0.5 mg/dL (ref 0.3–1.2)
BUN: 19 mg/dL (ref 6–20)
CO2: 22 mmol/L (ref 22–32)
CREATININE: 1.13 mg/dL — AB (ref 0.44–1.00)
Calcium: 8.9 mg/dL (ref 8.9–10.3)
Chloride: 104 mmol/L (ref 101–111)
GFR calc Af Amer: 57 mL/min — ABNORMAL LOW (ref >60)
GFR, EST NON AFRICAN AMERICAN: 49 mL/min — AB (ref >60)
GLUCOSE: 259 mg/dL — AB (ref 65–99)
POTASSIUM: 4.4 mmol/L (ref 3.5–5.1)
Sodium: 135 mmol/L (ref 135–145)
Total Protein: 7.7 g/dL (ref 6.5–8.1)

## 2015-05-09 LAB — URINALYSIS, ROUTINE W REFLEX MICROSCOPIC
Bilirubin Urine: NEGATIVE
Glucose, UA: 500 mg/dL — AB
HGB URINE DIPSTICK: NEGATIVE
KETONES UR: NEGATIVE mg/dL
LEUKOCYTES UA: NEGATIVE
Nitrite: NEGATIVE
PH: 6 (ref 5.0–8.0)
Protein, ur: NEGATIVE mg/dL
SPECIFIC GRAVITY, URINE: 1.01 (ref 1.005–1.030)
Urobilinogen, UA: 0.2 mg/dL (ref 0.0–1.0)

## 2015-05-09 LAB — D-DIMER, QUANTITATIVE (NOT AT ARMC): D DIMER QUANT: 0.53 ug{FEU}/mL — AB (ref 0.00–0.48)

## 2015-05-09 LAB — BRAIN NATRIURETIC PEPTIDE: B Natriuretic Peptide: 18 pg/mL (ref 0.0–100.0)

## 2015-05-09 MED ORDER — ENOXAPARIN SODIUM 100 MG/ML ~~LOC~~ SOLN
100.0000 mg | Freq: Once | SUBCUTANEOUS | Status: AC
  Administered 2015-05-09: 100 mg via SUBCUTANEOUS
  Filled 2015-05-09: qty 1

## 2015-05-09 MED ORDER — TRAMADOL HCL 50 MG PO TABS: 50.0000 mg | ORAL_TABLET | Freq: Four times a day (QID) | ORAL | Status: DC | PRN

## 2015-05-09 NOTE — ED Notes (Signed)
Pt made aware a urine sample was needed.

## 2015-05-09 NOTE — ED Provider Notes (Signed)
CSN: [id number]     Arrival date & time 05/09/15  1244 History   First MD Initiated Contact with Patient 05/09/15 1305     Chief Complaint  Patient presents with  . Leg Swelling     (Consider location/radiation/quality/duration/timing/severity/associated sxs/prior Treatment) HPI Comments: Presents to the ER for evaluation of right leg swelling. Patient reports that both her legs are somewhat swollen, but she has had increased swelling of the right leg with pain in the calf since yesterday. She has not had any known injury. There is no chest pain or shortness of breath associated with symptoms. She denies injury to the leg. No history of blood clots.   Past Medical History  Diagnosis Date  . Atrial fibrillation     Paroxysmal; LVH; nl EF; onset in 1999  . Hypertension   . Diabetes mellitus     insulin; managed by Dr. Dwyane Dee  . Hyperlipidemia   . Palpitations     negative event recorder in 2009  . Hypothyroidism     history of goiter; nl TSH off medication  . 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 Obesity 07/06/2012  . Itching with irritation 02/27/2015   Past Surgical History  Procedure Laterality Date  . Tubal ligation  1980's  . Cholecystectomy  11/95  . Retinal detachment surgery  2009  . Colonoscopy  2007  . Cesarean section    . Biopsy thyroid     Family History  Problem Relation Age of Onset  . Diabetes Mother   . Diabetes Daughter     gestational diabetes  . Cancer Father   . Thyroid disease Brother   . Other Son     MVA   History  Substance Use Topics  . Smoking status: Former Smoker -- 1.00 packs/day    Types: Cigarettes    Quit date: 08/13/1986  . Smokeless tobacco: Never Used  . Alcohol Use: No   OB History    Gravida Para Term Preterm AB TAB SAB Ectopic Multiple Living   5 2   3  3         Review of Systems  Respiratory: Negative for shortness of breath.   Cardiovascular: Negative for chest pain.  Musculoskeletal: Positive  for myalgias.  All other systems reviewed and are negative.     Allergies  Latex  Home Medications   Prior to Admission medications   Medication Sig Start Date End Date Taking? Authorizing Provider  ALPRAZolam Duanne Moron) 0.25 MG tablet Take 0.25 mg by mouth at bedtime as needed for sleep.    Yes Historical Provider, MD  amLODipine (NORVASC) 10 MG tablet TAKE (1) TABLET BY MOUTH ONCE DAILY. 11/19/14  Yes Herminio Commons, MD  Insulin Glargine (LANTUS SOLOSTAR) 100 UNIT/ML Solostar Pen INJECT 44 UNITS SUBCUTANEOUSLY ONCE EVERY MORNING. 03/20/15  Yes Elayne Snare, MD  insulin lispro (HUMALOG KWIKPEN) 100 UNIT/ML KiwkPen Inject 6-7 units before supper if sugar is high 12/22/14  Yes Elayne Snare, MD  Liraglutide (VICTOZA) 18 MG/3ML SOPN Inject 1.8 mg daily 02/16/15  Yes Elayne Snare, MD  metFORMIN (GLUCOPHAGE) 850 MG tablet TAKE (1) TABLET BY MOUTH TWICE DAILY. 09/22/14  Yes Elayne Snare, MD  metoprolol (LOPRESSOR) 50 MG tablet TAKE 0.5 (ONE-HALF) TABLET BY MOUTH TWICE DAILY. 12/22/14  Yes Herminio Commons, MD  omeprazole (PRILOSEC) 20 MG capsule Take 20 mg by mouth daily.     Yes Historical Provider, MD  polyethylene glycol powder (GLYCOLAX/MIRALAX) powder Take 1 Container by  mouth as needed for moderate constipation.  09/23/13  Yes Historical Provider, MD  potassium chloride (K-DUR) 10 MEQ tablet 10 mEq. Daily to every other day. 02/20/15  Yes Historical Provider, MD  pravastatin (PRAVACHOL) 20 MG tablet Take 1 tablet (20 mg total) by mouth daily. 04/22/15  Yes Elayne Snare, MD  torsemide (DEMADEX) 20 MG tablet TAKE 1 TABLET BY MOUTH ONCE DAILY AS NEEDED. 01/21/15  Yes Herminio Commons, MD  valsartan (DIOVAN) 320 MG tablet Take 320 mg by mouth daily.     Yes Historical Provider, MD  B-D ULTRAFINE III SHORT PEN 31G X 8 MM MISC USE AS DIRECTED 2-3 TIMES A DAY. 12/22/14   Elayne Snare, MD  Blood Glucose Monitoring Suppl (ACCU-CHEK AVIVA PLUS) W/DEVICE KIT Use to check blood sugar 2 times per day dx code E11.65  10/09/14   Elayne Snare, MD  DULoxetine (CYMBALTA) 30 MG capsule Take 1 capsule (30 mg total) by mouth daily. Patient not taking: Reported on 05/09/2015 05/06/15   Elayne Snare, MD  glucose blood (ACCU-CHEK AVIVA PLUS) test strip USE TO TEST TWICE DAILY. Dx code E11.65 11/26/14   Elayne Snare, MD  Lancets Glory Rosebush ULTRASOFT) lancets  04/29/13   Historical Provider, MD  nystatin cream (MYCOSTATIN) Apply 1 application topically 2 (two) times daily. Patient not taking: Reported on 05/09/2015 02/27/15   Estill Dooms, NP  triamcinolone ointment (KENALOG) 0.5 % Apply 1 application topically 2 (two) times daily. Patient not taking: Reported on 05/09/2015 02/27/15   Estill Dooms, NP   BP 145/77 mmHg  Pulse 71  Temp(Src) 98.2 F (36.8 C) (Oral)  Resp 18  Ht 5' 7"  (1.702 m)  Wt 240 lb (108.863 kg)  BMI 37.58 kg/m2  SpO2 100% Physical Exam  Constitutional: She is oriented to person, place, and time. She appears well-developed and well-nourished. No distress.  HENT:  Head: Normocephalic and atraumatic.  Right Ear: Hearing normal.  Left Ear: Hearing normal.  Nose: Nose normal.  Mouth/Throat: Oropharynx is clear and moist and mucous membranes are normal.  Eyes: Conjunctivae and EOM are normal. Pupils are equal, round, and reactive to light.  Neck: Normal range of motion. Neck supple.  Cardiovascular: Regular rhythm, S1 normal and S2 normal.  Exam reveals no gallop and no friction rub.   No murmur heard. Pulses:      Dorsalis pedis pulses are 2+ on the right side, and 2+ on the left side.  Pulmonary/Chest: Effort normal and breath sounds normal. No respiratory distress. She exhibits no tenderness.  Abdominal: Soft. Normal appearance and bowel sounds are normal. There is no hepatosplenomegaly. There is no tenderness. There is no rebound, no guarding, no tenderness at McBurney's point and negative Murphy's sign. No hernia.  Musculoskeletal: Normal range of motion.       Right lower leg: She exhibits  tenderness (mild tenderness in the posterior lower leg without any evidence of swelling or increased circumference).  Neurological: She is alert and oriented to person, place, and time. She has normal strength. No cranial nerve deficit or sensory deficit. Coordination normal. GCS eye subscore is 4. GCS verbal subscore is 5. GCS motor subscore is 6.  Skin: Skin is warm, dry and intact. No rash noted. No cyanosis.  Psychiatric: She has a normal mood and affect. Her speech is normal and behavior is normal. Thought content normal.  Nursing note and vitals reviewed.   ED Course  Procedures (including critical care time) Labs Review Labs Reviewed  CBC WITH DIFFERENTIAL/PLATELET  COMPREHENSIVE METABOLIC PANEL  URINALYSIS, ROUTINE W REFLEX MICROSCOPIC (NOT AT [facility])  BRAIN NATRIURETIC PEPTIDE  D-DIMER, QUANTITATIVE (NOT AT [facility])    Imaging Review No results found.   EKG Interpretation None      MDM   Final diagnoses:  None   peripheral edema  Calf Pain  Presents to the emergency room for evaluation of swelling of both legs with pain in the right lower leg. She feels like the right leg has been more swollen, but at this time there equivalent. She does have tenderness in the posterior aspect of the leg and calf without any palpable venous cords. No clinical concern for congestive heart failure. BNP was negative. Blood work was essentially normal except for very slightly elevated d-dimer. Risks and benefits of empiric treatment with Lovenox was discussed with patient. She would like to have the Lovenox administered, will have venous evaluation tomorrow morning to rule out DVT. Suspect that this is simple strain, will treat with Ultram. Patient was counseled to rest and elevate the legs for the swelling, follow-up with PCP.    Orpah Greek, MD 05/09/15 [phone]

## 2015-05-09 NOTE — Discharge Instructions (Signed)
Peripheral Edema You have swelling in your legs (peripheral edema). This swelling is due to excess accumulation of salt and water in your body. Edema may be a sign of heart, kidney or liver disease, or a side effect of a medication. It may also be due to problems in the leg veins. Elevating your legs and using special support stockings may be very helpful, if the cause of the swelling is due to poor venous circulation. Avoid long periods of standing, whatever the cause. Treatment of edema depends on identifying the cause. Chips, pretzels, pickles and other salty foods should be avoided. Restricting salt in your diet is almost always needed. Water pills (diuretics) are often used to remove the excess salt and water from your body via urine. These medicines prevent the kidney from reabsorbing sodium. This increases urine flow. Diuretic treatment may also result in lowering of potassium levels in your body. Potassium supplements may be needed if you have to use diuretics daily. Daily weights can help you keep track of your progress in clearing your edema. You should call your caregiver for follow up care as recommended. SEEK IMMEDIATE MEDICAL CARE IF:   You have increased swelling, pain, redness, or heat in your legs.  You develop shortness of breath, especially when lying down.  You develop chest or abdominal pain, weakness, or fainting.  You have a fever. Document Released: 12/15/2004 Document Revised: 01/30/2012 Document Reviewed: 11/25/2009 [facility] Patient Information 2015 [place], [place]. This information is not intended to replace advice given to you by your health care provider. Make sure you discuss any questions you have with your health care provider.  Muscle Strain A muscle strain is an injury that occurs when a muscle is stretched beyond its normal length. Usually a small number of muscle fibers are torn when this happens. Muscle strain is rated in degrees. First-degree strains have the  least amount of muscle fiber tearing and pain. Second-degree and third-degree strains have increasingly more tearing and pain.  Usually, recovery from muscle strain takes 1-2 weeks. Complete healing takes 5-6 weeks.  CAUSES  Muscle strain happens when a sudden, violent force placed on a muscle stretches it too far. This may occur with lifting, sports, or a fall.  RISK FACTORS Muscle strain is especially common in athletes.  SIGNS AND SYMPTOMS At the site of the muscle strain, there may be:  Pain.  Bruising.  Swelling.  Difficulty using the muscle due to pain or lack of normal function. DIAGNOSIS  Your health care provider will perform a physical exam and ask about your medical history. TREATMENT  Often, the best treatment for a muscle strain is resting, icing, and applying cold compresses to the injured area.  HOME CARE INSTRUCTIONS   Use the PRICE method of treatment to promote muscle healing during the first 2-3 days after your injury. The PRICE method involves:  Protecting the muscle from being injured again.  Restricting your activity and resting the injured body part.  Icing your injury. To do this, put ice in a plastic bag. Place a towel between your skin and the bag. Then, apply the ice and leave it on from 15-20 minutes each hour. After the third day, switch to moist heat packs.  Apply compression to the injured area with a splint or elastic bandage. Be careful not to wrap it too tightly. This may interfere with blood circulation or increase swelling.  Elevate the injured body part above the level of your heart as often as you can.  Only take over-the-counter or prescription medicines for pain, discomfort, or fever as directed by your health care provider.  Warming up prior to exercise helps to prevent future muscle strains. SEEK MEDICAL CARE IF:   You have increasing pain or swelling in the injured area.  You have numbness, tingling, or a significant loss of  strength in the injured area. MAKE SURE YOU:   Understand these instructions.  Will watch your condition.  Will get help right away if you are not doing well or get worse. Document Released: 11/07/2005 Document Revised: 08/28/2013 Document Reviewed: 06/06/2013 [facility] Patient Information 2015 [place], [place]. This information is not intended to replace advice given to you by your health care provider. Make sure you discuss any questions you have with your health care provider.

## 2015-05-09 NOTE — ED Notes (Signed)
MD Pollina at bedside. 

## 2015-05-09 NOTE — ED Notes (Signed)
Pt reports right leg swelling since last night. Pt denies any known injury.

## 2015-05-10 ENCOUNTER — Ambulatory Visit (HOSPITAL_COMMUNITY)
Admit: 2015-05-10 | Discharge: 2015-05-10 | Disposition: A | Discharge status: Home / Self Care | Payer: Medicare Other | Source: Ambulatory Visit | Attending: Emergency Medicine | Admitting: Emergency Medicine

## 2015-05-10 DIAGNOSIS — M79604 Pain in right leg: Secondary | ICD-10-CM | POA: Diagnosis not present

## 2015-05-10 DIAGNOSIS — M7989 Other specified soft tissue disorders: Secondary | ICD-10-CM | POA: Insufficient documentation

## 2015-05-13 ENCOUNTER — Ambulatory Visit (INDEPENDENT_AMBULATORY_CARE_PROVIDER_SITE_OTHER): Payer: PRIVATE HEALTH INSURANCE | Admitting: Ophthalmology

## 2015-05-20 ENCOUNTER — Ambulatory Visit (INDEPENDENT_AMBULATORY_CARE_PROVIDER_SITE_OTHER): Payer: Medicare Other | Admitting: Ophthalmology

## 2015-05-21 ENCOUNTER — Other Ambulatory Visit: Payer: Self-pay | Admitting: Endocrinology

## 2015-05-21 ENCOUNTER — Other Ambulatory Visit: Payer: Self-pay | Admitting: *Deleted

## 2015-05-21 MED ORDER — GLUCOSE BLOOD VI STRP: ORAL_STRIP | Status: DC

## 2015-05-21 MED ORDER — LIRAGLUTIDE 18 MG/3ML ~~LOC~~ SOPN: PEN_INJECTOR | SUBCUTANEOUS | Status: DC

## 2015-05-21 MED ORDER — INSULIN PEN NEEDLE 31G X 8 MM MISC: Status: DC

## 2015-06-16 ENCOUNTER — Other Ambulatory Visit (HOSPITAL_COMMUNITY): Payer: Self-pay | Admitting: Family Medicine

## 2015-06-16 DIAGNOSIS — Z1231 Encounter for screening mammogram for malignant neoplasm of breast: Secondary | ICD-10-CM

## 2015-06-19 ENCOUNTER — Other Ambulatory Visit: Payer: Self-pay | Admitting: Cardiovascular Disease

## 2015-06-29 DIAGNOSIS — E1140 Type 2 diabetes mellitus with diabetic neuropathy, unspecified: Secondary | ICD-10-CM | POA: Diagnosis not present

## 2015-06-29 DIAGNOSIS — I10 Essential (primary) hypertension: Secondary | ICD-10-CM | POA: Diagnosis not present

## 2015-07-06 ENCOUNTER — Ambulatory Visit: Payer: Medicare Other | Admitting: Endocrinology

## 2015-07-20 ENCOUNTER — Other Ambulatory Visit: Payer: Self-pay | Admitting: Endocrinology

## 2015-07-20 DIAGNOSIS — I739 Peripheral vascular disease, unspecified: Secondary | ICD-10-CM | POA: Diagnosis not present

## 2015-07-20 DIAGNOSIS — M7752 Other enthesopathy of left foot: Secondary | ICD-10-CM | POA: Diagnosis not present

## 2015-07-20 DIAGNOSIS — M216X9 Other acquired deformities of unspecified foot: Secondary | ICD-10-CM | POA: Diagnosis not present

## 2015-07-20 DIAGNOSIS — M65872 Other synovitis and tenosynovitis, left ankle and foot: Secondary | ICD-10-CM | POA: Diagnosis not present

## 2015-07-20 DIAGNOSIS — L603 Nail dystrophy: Secondary | ICD-10-CM | POA: Diagnosis not present

## 2015-07-20 DIAGNOSIS — L97529 Non-pressure chronic ulcer of other part of left foot with unspecified severity: Secondary | ICD-10-CM | POA: Diagnosis not present

## 2015-07-20 DIAGNOSIS — L03119 Cellulitis of unspecified part of limb: Secondary | ICD-10-CM | POA: Diagnosis not present

## 2015-07-20 DIAGNOSIS — E1051 Type 1 diabetes mellitus with diabetic peripheral angiopathy without gangrene: Secondary | ICD-10-CM | POA: Diagnosis not present

## 2015-07-29 ENCOUNTER — Ambulatory Visit (HOSPITAL_COMMUNITY): Payer: Medicare Other

## 2015-07-29 DIAGNOSIS — L97529 Non-pressure chronic ulcer of other part of left foot with unspecified severity: Secondary | ICD-10-CM | POA: Diagnosis not present

## 2015-07-31 ENCOUNTER — Ambulatory Visit (HOSPITAL_COMMUNITY)
Admission: RE | Admit: 2015-07-31 | Discharge: 2015-07-31 | Disposition: A | Discharge status: Home / Self Care | Payer: Medicare Other | Source: Ambulatory Visit | Attending: Family Medicine | Admitting: Family Medicine

## 2015-07-31 DIAGNOSIS — Z1231 Encounter for screening mammogram for malignant neoplasm of breast: Secondary | ICD-10-CM | POA: Insufficient documentation

## 2015-08-10 ENCOUNTER — Ambulatory Visit: Payer: Medicare Other | Admitting: Endocrinology

## 2015-08-12 DIAGNOSIS — L97529 Non-pressure chronic ulcer of other part of left foot with unspecified severity: Secondary | ICD-10-CM | POA: Diagnosis not present

## 2015-08-12 DIAGNOSIS — L600 Ingrowing nail: Secondary | ICD-10-CM | POA: Diagnosis not present

## 2015-08-12 DIAGNOSIS — Z79899 Other long term (current) drug therapy: Secondary | ICD-10-CM | POA: Diagnosis not present

## 2015-08-12 DIAGNOSIS — E119 Type 2 diabetes mellitus without complications: Secondary | ICD-10-CM | POA: Diagnosis not present

## 2015-08-19 ENCOUNTER — Ambulatory Visit: Payer: Medicare Other | Admitting: Cardiovascular Disease

## 2015-08-19 DIAGNOSIS — L97529 Non-pressure chronic ulcer of other part of left foot with unspecified severity: Secondary | ICD-10-CM | POA: Diagnosis not present

## 2015-08-21 ENCOUNTER — Other Ambulatory Visit: Payer: Self-pay | Admitting: Cardiovascular Disease

## 2015-09-02 ENCOUNTER — Other Ambulatory Visit: Payer: Self-pay | Admitting: Podiatry

## 2015-09-02 ENCOUNTER — Ambulatory Visit: Payer: Medicare Other | Admitting: Endocrinology

## 2015-09-02 DIAGNOSIS — L97519 Non-pressure chronic ulcer of other part of right foot with unspecified severity: Secondary | ICD-10-CM | POA: Diagnosis not present

## 2015-09-02 DIAGNOSIS — D2272 Melanocytic nevi of left lower limb, including hip: Secondary | ICD-10-CM | POA: Diagnosis not present

## 2015-09-02 DIAGNOSIS — D485 Neoplasm of uncertain behavior of skin: Secondary | ICD-10-CM | POA: Diagnosis not present

## 2015-09-02 DIAGNOSIS — L905 Scar conditions and fibrosis of skin: Secondary | ICD-10-CM | POA: Diagnosis not present

## 2015-09-09 DIAGNOSIS — D2372 Other benign neoplasm of skin of left lower limb, including hip: Secondary | ICD-10-CM | POA: Diagnosis not present

## 2015-09-09 DIAGNOSIS — D492 Neoplasm of unspecified behavior of bone, soft tissue, and skin: Secondary | ICD-10-CM | POA: Diagnosis not present

## 2015-09-15 ENCOUNTER — Ambulatory Visit: Payer: Medicare Other | Admitting: Endocrinology

## 2015-09-17 ENCOUNTER — Other Ambulatory Visit: Payer: Self-pay | Admitting: Endocrinology

## 2015-09-17 DIAGNOSIS — L03031 Cellulitis of right toe: Secondary | ICD-10-CM | POA: Diagnosis not present

## 2015-09-22 ENCOUNTER — Ambulatory Visit: Payer: Medicare Other | Admitting: Endocrinology

## 2015-09-22 ENCOUNTER — Ambulatory Visit (INDEPENDENT_AMBULATORY_CARE_PROVIDER_SITE_OTHER): Payer: Medicare Other | Admitting: Cardiovascular Disease

## 2015-09-22 ENCOUNTER — Encounter: Payer: Self-pay | Admitting: Cardiovascular Disease

## 2015-09-22 VITALS — BP 142/64 | HR 76 | Ht 67.0 in | Wt 234.0 lb

## 2015-09-22 DIAGNOSIS — Z23 Encounter for immunization: Secondary | ICD-10-CM

## 2015-09-22 DIAGNOSIS — I10 Essential (primary) hypertension: Secondary | ICD-10-CM | POA: Diagnosis not present

## 2015-09-22 DIAGNOSIS — I480 Paroxysmal atrial fibrillation: Secondary | ICD-10-CM

## 2015-09-22 DIAGNOSIS — E785 Hyperlipidemia, unspecified: Secondary | ICD-10-CM

## 2015-09-22 DIAGNOSIS — Z7189 Other specified counseling: Secondary | ICD-10-CM

## 2015-09-22 DIAGNOSIS — Z7182 Exercise counseling: Secondary | ICD-10-CM

## 2015-09-22 NOTE — Patient Instructions (Signed)
Your physician wants you to follow-up in: 2 years with Dr Virgina Jock will receive a reminder letter in the mail two months in advance. If you don't receive a letter, please call our office to schedule the follow-up appointment.    Your physician recommends that you continue on your current medications as directed. Please refer to the Current Medication list given to you today.     If you need a refill on your cardiac medications before your next appointment, please call your pharmacy.      Thank you for choosing [facility] !

## 2015-09-22 NOTE — Progress Notes (Signed)
Patient ID: Brittney [name], female   DOB: 1946/07/06, 69 y.o.   MRN: [id number]      SUBJECTIVE: The patient returns for followup for a remote history of paroxysmal atrial fibrillation. She is doing very well and seldom has palpitations. She said she would like to lose more weight. She denies chest pain and shortness of breath. She sometimes has mild leg swelling.   Review of Systems: As per "subjective", otherwise negative.  Allergies  Allergen Reactions  . Latex     Itching and breaking out    Current Outpatient Prescriptions  Medication Sig Dispense Refill  . ALPRAZolam (XANAX) 0.25 MG tablet Take 0.25 mg by mouth at bedtime as needed for sleep.     Marland Kitchen amLODipine (NORVASC) 10 MG tablet TAKE (1) TABLET BY MOUTH ONCE DAILY. 30 tablet 3  . B-D ULTRAFINE III SHORT PEN 31G X 8 MM MISC USE AS DIRECTED 2-3 TIMES A DAY. 100 each 0  . Blood Glucose Monitoring Suppl (ACCU-CHEK AVIVA PLUS) W/DEVICE KIT Use to check blood sugar 2 times per day dx code E11.65 1 kit 0  . glucose blood (ACCU-CHEK AVIVA PLUS) test strip USE TO TEST TWICE DAILY. Dx code E11.65 100 each 3  . insulin lispro (HUMALOG KWIKPEN) 100 UNIT/ML KiwkPen Inject 6-7 units before supper if sugar is high 15 mL 2  . Lancets (ONETOUCH ULTRASOFT) lancets     . LANTUS SOLOSTAR 100 UNIT/ML Solostar Pen INJECT 44 UNITS SUBCUTANEOUSLY ONCE EVERY MORNING. 15 mL 3  . metFORMIN (GLUCOPHAGE) 850 MG tablet TAKE (1) TABLET BY MOUTH TWICE DAILY. 60 tablet 3  . metoprolol (LOPRESSOR) 50 MG tablet TAKE 0.5 (ONE-HALF) TABLET BY MOUTH TWICE DAILY. 30 tablet 3  . nystatin cream (MYCOSTATIN) Apply 1 application topically 2 (two) times daily. 30 g 0  . omeprazole (PRILOSEC) 20 MG capsule Take 20 mg by mouth daily.      . polyethylene glycol powder (GLYCOLAX/MIRALAX) powder Take 1 Container by mouth as needed for moderate constipation.     . potassium chloride (K-DUR) 10 MEQ tablet 10 mEq. Daily to every other day.    . torsemide (DEMADEX) 20 MG  tablet TAKE 1 TABLET BY MOUTH ONCE DAILY AS NEEDED. 30 tablet 11  . traMADol (ULTRAM) 50 MG tablet Take 1 tablet (50 mg total) by mouth every 6 (six) hours as needed. 15 tablet 0  . valsartan (DIOVAN) 320 MG tablet Take 320 mg by mouth daily.      Marland Kitchen VICTOZA 18 MG/3ML SOPN INJECT 1.8 MG SUBCUTANEOUSLY ONCE A DAY AS DIRECTED. 9 mL 2   No current facility-administered medications for this visit.    Past Medical History  Diagnosis Date  . Atrial fibrillation (HCC)     Paroxysmal; LVH; nl EF; onset in 1999  . Hypertension   . Diabetes mellitus     insulin; managed by Dr. Dwyane Dee  . Hyperlipidemia   . Palpitations     negative event recorder in 2009  . Hypothyroidism     history of goiter; nl TSH off medication  . 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 Obesity 07/06/2012  . Itching with irritation 02/27/2015    Past Surgical History  Procedure Laterality Date  . Tubal ligation  1980's  . Cholecystectomy  11/95  . Retinal detachment surgery  2009  . Colonoscopy  2007  . Cesarean section    . Biopsy thyroid      Social History   Social  History  . Marital Status: Married    Spouse Name: N/A  . Number of Children: N/A  . Years of Education: N/A   Occupational History  . Retired    Social History Main Topics  . Smoking status: Former Smoker -- 1.00 packs/day    Types: Cigarettes    Start date: 11/21/1966    Quit date: 08/13/1986  . Smokeless tobacco: Never Used  . Alcohol Use: No  . Drug Use: No  . Sexual Activity: No   Other Topics Concern  . Not on file   Social History Narrative   No regular exercise     Filed Vitals:   09/22/15 1025  BP: 142/64  Pulse: 76  Height: _0  (1.702 m)  Weight: 234 lb (106.142 kg)  SpO2: 99%    PHYSICAL EXAM General: NAD HEENT: Normal. Neck: No JVD, no thyromegaly. Lungs: Clear to auscultation bilaterally with normal respiratory effort. CV: Nondisplaced PMI.  Regular rate and rhythm, normal  S1/S2, no S3/S4, no murmur. No pretibial or periankle edema.  No carotid bruit.   Abdomen: Soft, nontender, obese, no distention.  Neurologic: Alert and oriented x 3.  Psych: Normal affect. Skin: Normal. Musculoskeletal: No gross deformities. Extremities: No clubbing or cyanosis.   ECG: Most recent ECG reviewed.      ASSESSMENT AND PLAN: 1. Paroxysmal atrial fibrillation: She has a very remote history, and thus has never been on anticoagulation. She seldom has palpitations which are short lived. Continue metoprolol at present dose. If she were to have documented recurrences, given her history of HTN and diabetes, she would warrant anticoagulation.  2. Essential HTN: Mildly elevated on valsartan and amlodipine. Recommend dietary modification and walking. Will monitor.  3. Hyperlipidemia: Lipid panel on 05/06/2015 demonstrated total cholesterol 190, triglycerides 173 (had been 95 in 2015), HDL 56, LDL 99. On pravastatin 40 mg. I spoke to her about the importance of regular exercise, walking in particular.  Dispo: f/u 2 years.   Kate Sable, M.D., F.A.C.C.

## 2015-09-26 ENCOUNTER — Other Ambulatory Visit: Payer: Self-pay | Admitting: Endocrinology

## 2015-09-30 DIAGNOSIS — I10 Essential (primary) hypertension: Secondary | ICD-10-CM | POA: Diagnosis not present

## 2015-09-30 DIAGNOSIS — Z6839 Body mass index (BMI) 39.0-39.9, adult: Secondary | ICD-10-CM | POA: Diagnosis not present

## 2015-09-30 DIAGNOSIS — N390 Urinary tract infection, site not specified: Secondary | ICD-10-CM | POA: Diagnosis not present

## 2015-09-30 DIAGNOSIS — E118 Type 2 diabetes mellitus with unspecified complications: Secondary | ICD-10-CM | POA: Diagnosis not present

## 2015-10-07 ENCOUNTER — Encounter: Payer: Self-pay | Admitting: Endocrinology

## 2015-10-07 ENCOUNTER — Ambulatory Visit (INDEPENDENT_AMBULATORY_CARE_PROVIDER_SITE_OTHER): Payer: Medicare Other | Admitting: Endocrinology

## 2015-10-07 VITALS — BP 126/68 | HR 71 | Temp 98.3°F | Resp 14 | Ht 67.0 in | Wt 236.2 lb

## 2015-10-07 DIAGNOSIS — E1165 Type 2 diabetes mellitus with hyperglycemia: Secondary | ICD-10-CM | POA: Diagnosis not present

## 2015-10-07 DIAGNOSIS — E042 Nontoxic multinodular goiter: Secondary | ICD-10-CM

## 2015-10-07 DIAGNOSIS — E040 Nontoxic diffuse goiter: Secondary | ICD-10-CM

## 2015-10-07 DIAGNOSIS — E785 Hyperlipidemia, unspecified: Secondary | ICD-10-CM

## 2015-10-07 LAB — URINALYSIS, ROUTINE W REFLEX MICROSCOPIC
Bilirubin Urine: NEGATIVE
Hgb urine dipstick: NEGATIVE
KETONES UR: NEGATIVE
Leukocytes, UA: NEGATIVE
NITRITE: NEGATIVE
Specific Gravity, Urine: 1.015 (ref 1.000–1.030)
Total Protein, Urine: NEGATIVE
Urine Glucose: 100 — AB
Urobilinogen, UA: 0.2 (ref 0.0–1.0)
WBC UA: NONE SEEN (ref 0–?)
pH: 6 (ref 5.0–8.0)

## 2015-10-07 LAB — MICROALBUMIN / CREATININE URINE RATIO
Creatinine,U: 114 mg/dL
Microalb Creat Ratio: 0.6 mg/g (ref 0.0–30.0)
Microalb, Ur: 0.7 mg/dL (ref 0.0–1.9)

## 2015-10-07 LAB — POCT GLYCOSYLATED HEMOGLOBIN (HGB A1C): Hemoglobin A1C: 7.6

## 2015-10-07 NOTE — Patient Instructions (Signed)
Check blood sugars on waking up 3-4  times a week Also check blood sugars about 2 hours after a meal and do this after different meals by rotation  Recommended blood sugar levels on waking up is 90-130 and about 2 hours after meal is 130-160  Please bring your blood sugar monitor to each visit, thank you  Lantus 42 and take Novolog at supper if eating starchy food

## 2015-10-07 NOTE — Progress Notes (Signed)
Patient ID: Brittney [name], female   DOB: 1946/09/24, 69 y.o.   MRN: [id number]   Reason for Appointment: Followup of diabetes  History of Present Illness    Type 2 DIABETES MELITUS, date of diagnosis: 1976      She has had long-standing diabetes taking insulin. Because of her difficulties with compliance using mealtime insulin she was given in [place] in 2011 and subsequently Victoza in addition to her Lantus and this helped her control However she tends to be erratic with her day-to-day care and has not had adequate A1c levels in the past, usually over 7%  RECENT history:   Insulin regimen: Lantus 44 units daily in a.m., NovoLog 6 units at times before mostly acb  Her A1c has been consistently over 8% but it is improved now at 7.6 She has also lost weight since her last visit in 6/16  Current glucose patterns and problems:  She has somewhat variable blood sugars in the mornings although on an average they are fairly good  She is still afraid of hypoglycemia with taking Novolog at suppertime and does not usually take it, does have at least one high reading after supper but usually does not check it at night  She probably takes Novolog only sometimes in the morning when her blood sugar is high  She has a few readings in the afternoon which look fairly good relatively good  She thinks she is watching her portions and also probably reducing carbohydrate and is able to lose weight She is taking Victoza with the dose of 1.8 mg    Hypoglycemia: None recently  Oral hypoglycemic drugs: Metformin        Side effects from medications:  Invokana:? Headache      Monitors blood glucose: 3 a day.    Glucometer: Accucheck         Blood Glucose readings from meter download:  Mean values apply above for all meters except median for One Touch  PRE-MEAL Fasting Lunch Dinner Bedtime Overall  Glucose range: 83-199   79, 106  149, 172   Mean/median:  116          Meals: 2-3 meals  per day. . Dinner 7-8 pm Doehave protein with breakfast consistently, has egg           Physical activity: exercise: not walking           Wt Readings from Last 3 Encounters:  10/07/15 236 lb 3.2 oz (107.14 kg)  09/22/15 234 lb (106.142 kg)  05/09/15 240 lb (108.863 kg)   GLYCEMIC CONTROL:  Lab Results  Component Value Date   HGBA1C 7.6 10/07/2015   HGBA1C 8.2* 05/06/2015   HGBA1C 8.6* 12/25/2014   Lab Results  Component Value Date   MICROALBUR 0.7 10/07/2015   [place] 99 05/06/2015   CREATININE 1.13* 05/09/2015           Medication List       This list is accurate as of: 10/07/15  4:41 PM.  Always use your most recent med list.               ACCU-CHEK AVIVA PLUS W/DEVICE Kit  Use to check blood sugar 2 times per day dx code E11.65     ALPRAZolam 0.25 MG tablet  Commonly known as:  XANAX  Take 0.25 mg by mouth at bedtime as needed for sleep.     amLODipine 10 MG tablet  Commonly known as:  NORVASC  TAKE (1)  TABLET BY MOUTH ONCE DAILY.     B-D ULTRAFINE III SHORT PEN 31G X 8 MM Misc  Generic drug:  Insulin Pen Needle  USE AS DIRECTED 2-3 TIMES A DAY.     ciprofloxacin 250 MG tablet  Commonly known as:  CIPRO     glucose blood test strip  Commonly known as:  ACCU-CHEK AVIVA PLUS  USE TO TEST TWICE DAILY. Dx code E11.65     insulin lispro 100 UNIT/ML KiwkPen  Commonly known as:  HUMALOG KWIKPEN  Inject 6-7 units before supper if sugar is high     LANTUS SOLOSTAR 100 UNIT/ML Solostar Pen  Generic drug:  Insulin Glargine  INJECT 44 UNITS SUBCUTANEOUSLY ONCE EVERY MORNING.     metFORMIN 850 MG tablet  Commonly known as:  GLUCOPHAGE  TAKE (1) TABLET BY MOUTH TWICE DAILY.     metoprolol 50 MG tablet  Commonly known as:  LOPRESSOR  TAKE 0.5 (ONE-HALF) TABLET BY MOUTH TWICE DAILY.     nystatin cream  Commonly known as:  MYCOSTATIN  Apply 1 application topically 2 (two) times daily.     omeprazole 20 MG capsule  Commonly known as:  PRILOSEC    Take 20 mg by mouth daily.     onetouch ultrasoft lancets     polyethylene glycol powder powder  Commonly known as:  GLYCOLAX/MIRALAX  Take 1 Container by mouth as needed for moderate constipation.     potassium chloride 10 MEQ tablet  Commonly known as:  K-DUR  10 mEq. Daily to every other day.     torsemide 20 MG tablet  Commonly known as:  DEMADEX  TAKE 1 TABLET BY MOUTH ONCE DAILY AS NEEDED.     traMADol 50 MG tablet  Commonly known as:  ULTRAM  Take 1 tablet (50 mg total) by mouth every 6 (six) hours as needed.     valsartan 320 MG tablet  Commonly known as:  DIOVAN  Take 320 mg by mouth daily.     VICTOZA 18 MG/3ML Sopn  Generic drug:  Liraglutide  INJECT 1.8 MG SUBCUTANEOUSLY ONCE A DAY AS DIRECTED.        Allergies:  Allergies  Allergen Reactions  . Latex     Itching and breaking out    Past Medical History  Diagnosis Date  . Atrial fibrillation (HCC)     Paroxysmal; LVH; nl EF; onset in 1999  . Hypertension   . Diabetes mellitus     insulin; managed by Dr. Dwyane Dee  . Hyperlipidemia   . Palpitations     negative event recorder in 2009  . Hypothyroidism     history of goiter; nl TSH off medication  . 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 Obesity 07/06/2012  . Itching with irritation 02/27/2015    Past Surgical History  Procedure Laterality Date  . Tubal ligation  1980's  . Cholecystectomy  11/95  . Retinal detachment surgery  2009  . Colonoscopy  2007  . Cesarean section    . Biopsy thyroid      Family History  Problem Relation Age of Onset  . Diabetes Mother   . Diabetes Daughter     gestational diabetes  . Cancer Father   . Thyroid disease Brother   . Other Son     MVA    Social History:  reports that she quit smoking about 29 years ago. Her smoking use included Cigarettes. She started smoking about 48  years ago. She smoked 1.00 pack per day. She has never used smokeless tobacco. She reports that she does  not drink alcohol or use illicit drugs.  Review of Systems:  She has had some treatments from the for Dr. recently  HYPERTENSION:  this is well controlled with current regimen, followed by PCP  HYPERLIPIDEMIA: The lipid abnormality consists of elevated LDL; she has been taking her Pravachol irregularly   Lab Results  Component Value Date   CHOL 190 05/06/2015   HDL 56.10 05/06/2015   [place] 99 05/06/2015   TRIG 173.0* 05/06/2015   CHOLHDL 3 05/06/2015    Multinodular goiter: She has had a long-standing multinodular goiter since at least 2005 which has been fairly stable clinically She does have history of occasional palpitations for years but has been treated with metoprolol by her cardiologist for this Her goiter has been autonomous. No local pressure symptoms of choking or difficulty swallowing She does complain of occasional palpitations; did have an episode a couple of weeks ago but has not been evaluated by cardiologist for this  No evidence of overt hyperthyroidism  with normal free T4 and free T3 levels; TSH has been consistently suppressed but has not had any recent labs  Her I-131 uptake in 2003 was 17% and her  thyroid ultrasound in 2014 did not show distinct nodules but a relative increase in size  Lab Results  Component Value Date   TSH 0.026* 05/27/2014   TSH 0.088* 02/06/2014   FREET4 1.01 09/09/2014   FREET4 1.19 05/27/2014       Examination:   BP 126/68 mmHg  Pulse 71  Temp(Src) 98.3 F (36.8 C)  Resp 14  Ht 5' 7"  (1.702 m)  Wt 236 lb 3.2 oz (107.14 kg)  BMI 36.99 kg/m2  SpO2 97%  Body mass index is 36.99 kg/(m^2).   Her thyroid is enlarged about 3-1/2-4 Times normal, nodular  ASSESSMENT/ PLAN:  Diabetes Type 2   The patient's diabetes control is surprisingly better and she has lost weight since her last visit See history of present illness for detailed discussion of his current management, blood sugar patterns and problems identified Since  most of her high readings previously were likely to be postprandial she is doing better now with modifying her diet and losing the weight she had gained before  She appears to have high readings only at times when she has carbohydrate but again difficult to assess since the frequently does not check readings after meals  Again discussed the need to take Novolog with evening meal if she is having any carbohydrate Also will do better with checking more readings after supper instead of all in the morning Discussed that Novolog only lasts for 4 hours and she can take it safely at suppertime  Meanwhile will reduce her Lantus to 42 units Regular walking to be restarted  Hypercholesterolemia: Lipids to be assessed on the next visit, she states she is compliant with her pravastatin  Hypertension:  Better controlled recently  MULTINODULAR goiter: Need to reassess her thyroid levels this patient with her recent episode of palpitations  Patient Instructions  Check blood sugars on waking up 3-4  times a week Also check blood sugars about 2 hours after a meal and do this after different meals by rotation  Recommended blood sugar levels on waking up is 90-130 and about 2 hours after meal is 130-160  Please bring your blood sugar monitor to each visit, thank you  Lantus 42 and take  Novolog at supper if eating starchy food     Counseling time on subjects discussed above is over 50% of today's 25 minute visit   Juston Goheen 10/07/2015, 4:41 PM

## 2015-10-13 ENCOUNTER — Telehealth: Payer: Self-pay | Admitting: Endocrinology

## 2015-10-13 NOTE — Telephone Encounter (Signed)
Patient meter is broken Blood Glucose Monitoring Suppl (ACCU-CHEK AVIVA PLUS) W/DEVICE KIT, she would like to know if she can get another. Please advise

## 2015-10-13 NOTE — Telephone Encounter (Signed)
I called Brittney [name] back, she said the meter wouldn't turn on, I advised her to replace the battery first to make sure that was not the problem, she agreed to this. I also told her to call me back if it still did not work and I would give her a new meter.

## 2015-10-14 ENCOUNTER — Telehealth: Payer: Self-pay | Admitting: Endocrinology

## 2015-10-14 ENCOUNTER — Other Ambulatory Visit: Payer: Self-pay | Admitting: *Deleted

## 2015-10-14 MED ORDER — GLUCOSE BLOOD VI STRP: ORAL_STRIP | Status: DC

## 2015-10-14 NOTE — Telephone Encounter (Signed)
Patient stated that she put a battery in her meter and it still didn't work, can she get another one, please advise

## 2015-10-19 ENCOUNTER — Other Ambulatory Visit: Payer: Self-pay | Admitting: Endocrinology

## 2015-10-19 ENCOUNTER — Other Ambulatory Visit: Payer: Self-pay | Admitting: Cardiovascular Disease

## 2015-10-21 ENCOUNTER — Other Ambulatory Visit (INDEPENDENT_AMBULATORY_CARE_PROVIDER_SITE_OTHER): Payer: Medicare Other

## 2015-10-21 DIAGNOSIS — E040 Nontoxic diffuse goiter: Secondary | ICD-10-CM

## 2015-10-21 DIAGNOSIS — E042 Nontoxic multinodular goiter: Secondary | ICD-10-CM | POA: Diagnosis not present

## 2015-10-21 DIAGNOSIS — IMO0002 Reserved for concepts with insufficient information to code with codable children: Secondary | ICD-10-CM

## 2015-10-21 DIAGNOSIS — E1165 Type 2 diabetes mellitus with hyperglycemia: Secondary | ICD-10-CM

## 2015-10-21 LAB — COMPREHENSIVE METABOLIC PANEL
ALBUMIN: 3.7 g/dL (ref 3.5–5.2)
ALT: 14 U/L (ref 0–35)
AST: 13 U/L (ref 0–37)
Alkaline Phosphatase: 93 U/L (ref 39–117)
BUN: 16 mg/dL (ref 6–23)
CHLORIDE: 105 meq/L (ref 96–112)
CO2: 26 mEq/L (ref 19–32)
CREATININE: 0.97 mg/dL (ref 0.40–1.20)
Calcium: 9.3 mg/dL (ref 8.4–10.5)
GFR: 73.35 mL/min (ref >60.00)
GLUCOSE: 196 mg/dL — AB (ref 70–99)
POTASSIUM: 4.3 meq/L (ref 3.5–5.1)
SODIUM: 139 meq/L (ref 135–145)
Total Bilirubin: 0.5 mg/dL (ref 0.2–1.2)
Total Protein: 7.2 g/dL (ref 6.0–8.3)

## 2015-10-21 LAB — T4, FREE: Free T4: 1.04 ng/dL (ref 0.60–1.60)

## 2015-10-21 LAB — TSH: TSH: 0.25 u[IU]/mL — AB (ref 0.35–4.50)

## 2015-10-21 LAB — T3, FREE: T3, Free: 3.6 pg/mL (ref 2.3–4.2)

## 2015-10-22 NOTE — Progress Notes (Signed)
Quick Note:  Please let patient know that the thyroid is better. However sugar was 196, need to know if her fasting sugars are consistently high ______

## 2015-11-18 ENCOUNTER — Other Ambulatory Visit: Payer: Self-pay | Admitting: Endocrinology

## 2015-11-26 ENCOUNTER — Other Ambulatory Visit: Payer: Self-pay | Admitting: Endocrinology

## 2015-12-15 DIAGNOSIS — I739 Peripheral vascular disease, unspecified: Secondary | ICD-10-CM | POA: Diagnosis not present

## 2015-12-15 DIAGNOSIS — M2570 Osteophyte, unspecified joint: Secondary | ICD-10-CM | POA: Diagnosis not present

## 2015-12-15 DIAGNOSIS — E1151 Type 2 diabetes mellitus with diabetic peripheral angiopathy without gangrene: Secondary | ICD-10-CM | POA: Diagnosis not present

## 2015-12-18 ENCOUNTER — Other Ambulatory Visit: Payer: Self-pay | Admitting: Cardiovascular Disease

## 2016-01-07 ENCOUNTER — Ambulatory Visit: Payer: Medicare Other | Admitting: Endocrinology

## 2016-01-19 ENCOUNTER — Other Ambulatory Visit: Payer: Self-pay | Admitting: Endocrinology

## 2016-02-01 DIAGNOSIS — I10 Essential (primary) hypertension: Secondary | ICD-10-CM | POA: Diagnosis not present

## 2016-02-01 DIAGNOSIS — E1140 Type 2 diabetes mellitus with diabetic neuropathy, unspecified: Secondary | ICD-10-CM | POA: Diagnosis not present

## 2016-02-02 ENCOUNTER — Telehealth: Payer: Self-pay | Admitting: Endocrinology

## 2016-02-02 NOTE — Telephone Encounter (Signed)
Message left for patient to return call.

## 2016-02-02 NOTE — Telephone Encounter (Signed)
PT called in wanting to talk to [facility] about a medication issue she is having.

## 2016-02-03 NOTE — Telephone Encounter (Signed)
Pt returning your call this morning.

## 2016-02-08 NOTE — Telephone Encounter (Signed)
Unable to reach patient on call back

## 2016-02-12 ENCOUNTER — Ambulatory Visit (INDEPENDENT_AMBULATORY_CARE_PROVIDER_SITE_OTHER): Payer: Medicare Other | Admitting: Cardiovascular Disease

## 2016-02-12 ENCOUNTER — Encounter: Payer: Self-pay | Admitting: Cardiovascular Disease

## 2016-02-12 VITALS — BP 148/75 | HR 80 | Ht 67.0 in | Wt 237.0 lb

## 2016-02-12 DIAGNOSIS — I10 Essential (primary) hypertension: Secondary | ICD-10-CM | POA: Diagnosis not present

## 2016-02-12 DIAGNOSIS — R002 Palpitations: Secondary | ICD-10-CM

## 2016-02-12 DIAGNOSIS — I480 Paroxysmal atrial fibrillation: Secondary | ICD-10-CM

## 2016-02-12 DIAGNOSIS — E785 Hyperlipidemia, unspecified: Secondary | ICD-10-CM

## 2016-02-12 DIAGNOSIS — F419 Anxiety disorder, unspecified: Secondary | ICD-10-CM

## 2016-02-12 NOTE — Patient Instructions (Signed)
Continue all current medications. Your physician wants you to follow up in:  2 years.  You will receive a reminder letter in the mail one-two months in advance.  If you don't receive a letter, please call our office to schedule the follow up appointment

## 2016-02-12 NOTE — Progress Notes (Signed)
Patient ID: Brittney [name], female   DOB: [birth year], 70 y.o.   MRN: [id number]      SUBJECTIVE: The patient returns for followup for a remote history of paroxysmal atrial fibrillation. When I saw her in November 2016, she was doing well. She has a history of anxiety and things have been more stressful for her lately. Her PCP recently increased her Xanax from 0.25 mg at bedtime to 0.5 mg at bedtime. She has been noticing more palpitations when she lies down at night and she begins to think about things which make her anxious. She denies palpitations during the day and also denies chest pain, leg swelling, shortness of breath, and dizziness.   Review of Systems: As per "subjective", otherwise negative.  Allergies  Allergen Reactions  . Latex     Itching and breaking out    Current Outpatient Prescriptions  Medication Sig Dispense Refill  . ALPRAZolam (XANAX) 0.5 MG tablet Take 1 tablet by mouth at bedtime.    Marland Kitchen amLODipine (NORVASC) 10 MG tablet TAKE (1) TABLET BY MOUTH ONCE DAILY. 30 tablet 11  . B-D ULTRAFINE III SHORT PEN 31G X 8 MM MISC USE AS DIRECTED 2-3 TIMES A DAY. 100 each 0  . Blood Glucose Monitoring Suppl (ACCU-CHEK AVIVA PLUS) W/DEVICE KIT Use to check blood sugar 2 times per day dx code E11.65 1 kit 0  . glucose blood (ACCU-CHEK AVIVA PLUS) test strip USE TO TEST TWICE DAILY. Dx code E11.65 100 each 3  . HUMALOG KWIKPEN 100 UNIT/ML KiwkPen INJECT 6 TO 7 UNITS SUBCUTANEOUSLY BEFORE SUPPER IF GLUCOSE IS HIGH. 15 mL 0  . Insulin Glargine (LANTUS SOLOSTAR) 100 UNIT/ML Solostar Pen Inject 42 Units into the skin daily at 10 pm.    . Lancets (ONETOUCH ULTRASOFT) lancets     . metFORMIN (GLUCOPHAGE) 850 MG tablet TAKE (1) TABLET BY MOUTH TWICE DAILY. 60 tablet 2  . metoprolol (LOPRESSOR) 50 MG tablet TAKE 0.5 (ONE-HALF) TABLET BY MOUTH TWICE DAILY. 30 tablet 11  . Multiple Vitamin (MULTIVITAMIN) tablet Take 1 tablet by mouth daily.    Marland Kitchen omeprazole (PRILOSEC) 20 MG capsule Take 20 mg  by mouth daily.      . polyethylene glycol powder (GLYCOLAX/MIRALAX) powder Take 1 Container by mouth as needed for moderate constipation.     . potassium chloride (K-DUR) 10 MEQ tablet Take 10 mEq by mouth daily.     Marland Kitchen torsemide (DEMADEX) 20 MG tablet TAKE 1 TABLET BY MOUTH ONCE DAILY AS NEEDED. 30 tablet 11  . traMADol (ULTRAM) 50 MG tablet Take 1 tablet (50 mg total) by mouth every 6 (six) hours as needed. 15 tablet 0  . valsartan (DIOVAN) 320 MG tablet Take 320 mg by mouth daily.      Marland Kitchen VICTOZA 18 MG/3ML SOPN INJECT 1.8 MG SUBCUTANEOUSLY ONCE A DAY AS DIRECTED. 9 mL 3   No current facility-administered medications for this visit.    Past Medical History  Diagnosis Date  . Atrial fibrillation (HCC)     Paroxysmal; LVH; nl EF; onset in 1999  . Hypertension   . Diabetes mellitus     insulin; managed by Dr. Dwyane Dee  . Hyperlipidemia   . Palpitations     negative event recorder in 2009  . Hypothyroidism     history of goiter; nl TSH off medication  . 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 Obesity 07/06/2012  . Itching with irritation 02/27/2015    Past  Surgical History  Procedure Laterality Date  . Tubal ligation  1980's  . Cholecystectomy  11/95  . Retinal detachment surgery  2009  . Colonoscopy  2007  . Cesarean section    . Biopsy thyroid      Social History   Social History  . Marital Status: Married    Spouse Name: N/A  . Number of Children: N/A  . Years of Education: N/A   Occupational History  . Retired    Social History Main Topics  . Smoking status: Former Smoker -- 1.00 packs/day    Types: Cigarettes    Start date: 11/21/1966    Quit date: 08/13/1986  . Smokeless tobacco: Never Used  . Alcohol Use: No  . Drug Use: No  . Sexual Activity: No   Other Topics Concern  . Not on file   Social History Narrative   No regular exercise     Filed Vitals:   02/12/16 0814  BP: 148/75  Pulse: 80  Height: 5' 7"  (1.702 m)    Weight: 237 lb (107.502 kg)    PHYSICAL EXAM General: NAD HEENT: Normal. Neck: No JVD, no thyromegaly. Lungs: Clear to auscultation bilaterally with normal respiratory effort. CV: Nondisplaced PMI. Regular rate and rhythm, normal S1/S2, no S3/S4, no murmur. No pretibial or periankle edema. No carotid bruit.  Abdomen: Soft, nontender, obese, no distention.  Neurologic: Alert and oriented x 3.  Psych: Normal affect. Skin: Normal. Musculoskeletal: No gross deformities. Extremities: No clubbing or cyanosis.   ECG: Most recent ECG reviewed.      ASSESSMENT AND PLAN: 1. Paroxysmal atrial fibrillation: She has a very remote history, and thus has never been on anticoagulation. I feel her current palpitations are more related to anxiety and her h/o panic attacks. Continue metoprolol at present dose. If she were to have documented recurrences, given her history of HTN and diabetes, she would warrant anticoagulation.  2. Essential HTN: Mildly elevated on valsartan and amlodipine. Recommend dietary modification and walking. Will monitor.  3. Hyperlipidemia: Lipid panel on 05/06/2015 demonstrated total cholesterol 190, triglycerides 173 (had been 95 in 2015), HDL 56, LDL 99. On pravastatin 40 mg. I spoke to her about the importance of regular exercise, walking in particular.  Dispo: f/u 2 years.   Kate Sable, M.D., F.A.C.C.

## 2016-02-17 ENCOUNTER — Other Ambulatory Visit: Payer: Self-pay | Admitting: Cardiovascular Disease

## 2016-02-17 ENCOUNTER — Other Ambulatory Visit: Payer: Self-pay | Admitting: Endocrinology

## 2016-02-19 ENCOUNTER — Ambulatory Visit: Payer: Medicare Other | Admitting: Endocrinology

## 2016-02-23 ENCOUNTER — Other Ambulatory Visit: Payer: Self-pay | Admitting: Adult Health

## 2016-02-23 ENCOUNTER — Encounter: Payer: Self-pay | Admitting: Adult Health

## 2016-02-23 ENCOUNTER — Ambulatory Visit (INDEPENDENT_AMBULATORY_CARE_PROVIDER_SITE_OTHER): Payer: Medicare Other | Admitting: Adult Health

## 2016-02-23 VITALS — BP 148/70 | HR 80 | Temp 98.3°F | Ht 67.0 in | Wt 239.5 lb

## 2016-02-23 DIAGNOSIS — R319 Hematuria, unspecified: Secondary | ICD-10-CM

## 2016-02-23 DIAGNOSIS — N390 Urinary tract infection, site not specified: Secondary | ICD-10-CM | POA: Diagnosis not present

## 2016-02-23 DIAGNOSIS — R300 Dysuria: Secondary | ICD-10-CM | POA: Insufficient documentation

## 2016-02-23 HISTORY — DX: Dysuria: R30.0

## 2016-02-23 HISTORY — DX: Urinary tract infection, site not specified: N39.0

## 2016-02-23 LAB — POCT URINALYSIS DIPSTICK
Nitrite, UA: POSITIVE
Protein, UA: NEGATIVE

## 2016-02-23 MED ORDER — CIPROFLOXACIN HCL 500 MG PO TABS: 500.0000 mg | ORAL_TABLET | Freq: Two times a day (BID) | ORAL | Status: DC

## 2016-02-23 NOTE — Patient Instructions (Signed)
Get AZO Push water and cranberry juice, liteUrinary Tract Infection Urinary tract infections (UTIs) can develop anywhere along your urinary tract. Your urinary tract is your body's drainage system for removing wastes and extra water. Your urinary tract includes two kidneys, two ureters, a bladder, and a urethra. Your kidneys are a pair of bean-shaped organs. Each kidney is about the size of your fist. They are located below your ribs, one on each side of your spine. CAUSES Infections are caused by microbes, which are microscopic organisms, including fungi, viruses, and bacteria. These organisms are so small that they can only be seen through a microscope. Bacteria are the microbes that most commonly cause UTIs. SYMPTOMS  Symptoms of UTIs may vary by age and gender of the patient and by the location of the infection. Symptoms in young women typically include a frequent and intense urge to urinate and a painful, burning feeling in the bladder or urethra during urination. Older women and men are more likely to be tired, shaky, and weak and have muscle aches and abdominal pain. A fever may mean the infection is in your kidneys. Other symptoms of a kidney infection include pain in your back or sides below the ribs, nausea, and vomiting. DIAGNOSIS To diagnose a UTI, your caregiver will ask you about your symptoms. Your caregiver will also ask you to provide a urine sample. The urine sample will be tested for bacteria and white blood cells. White blood cells are made by your body to help fight infection. TREATMENT  Typically, UTIs can be treated with medication. Because most UTIs are caused by a bacterial infection, they usually can be treated with the use of antibiotics. The choice of antibiotic and length of treatment depend on your symptoms and the type of bacteria causing your infection. HOME CARE INSTRUCTIONS  If you were prescribed antibiotics, take them exactly as your caregiver instructs you. Finish  the medication even if you feel better after you have only taken some of the medication.  Drink enough water and fluids to keep your urine clear or pale yellow.  Avoid caffeine, tea, and carbonated beverages. They tend to irritate your bladder.  Empty your bladder often. Avoid holding urine for long periods of time.  Empty your bladder before and after sexual intercourse.  After a bowel movement, women should cleanse from front to back. Use each tissue only once. SEEK MEDICAL CARE IF:   You have back pain.  You develop a fever.  Your symptoms do not begin to resolve within 3 days. SEEK IMMEDIATE MEDICAL CARE IF:   You have severe back pain or lower abdominal pain.  You develop chills.  You have nausea or vomiting.  You have continued burning or discomfort with urination. MAKE SURE YOU:   Understand these instructions.  Will watch your condition.  Will get help right away if you are not doing well or get worse.   This information is not intended to replace advice given to you by your health care provider. Make sure you discuss any questions you have with your health care provider.   Document Released: 08/17/2005 Document Revised: 07/29/2015 Document Reviewed: 12/16/2011 Elsevier Interactive Patient Education 2016 [employer]. Follow up prn

## 2016-02-23 NOTE — Progress Notes (Signed)
Subjective:     Patient ID: Brittney [name], female   DOB: December 30, 1945, 70 y.o.   MRN: [id number]  HPI Brittney [name] is a 70 year old black female, in complaining of burning with urination for 3 weeks now, has some urgency and dribbles. She denies any fever,back pain or abdominal pain or seeing any blood.  Review of Systems Patient denies any headaches, hearing loss, fatigue, blurred vision, shortness of breath, chest pain, abdominal pain, problems with bowel movements,  or intercourse(not having sex). No joint pain or mood swings.See HPI for positives. Reviewed past medical,surgical, social and family history. Reviewed medications and allergies.     Objective:   Physical Exam BP 148/70 mmHg  Pulse 80  Temp(Src) 98.3 F (36.8 C)  Ht 5\' 7"  (1.702 m)  Wt 239 lb 8 oz (108.636 kg)  BMI 37.50 kg/m2 Urine dipstick: +nitrates,trace glucose,2+ leuks and 1+ blood, Skin warm and dry. Lungs: clear to ausculation bilaterally. Cardiovascular: regular rate and rhythm.Abdomen soft and mildly tender RUQ and right flank.Pelvic: external genitalia is normal in appearance no lesions, vagina:atrophic,urethra has no lesions or masses noted, cervix:smooth,uterus: normal size, shape and contour, non tender, no masses felt, adnexa: no masses or tenderness noted. Bladder is non tender and no masses felt.    Assessment:     UTI Burning with urination     Plan:     UA C&S sent Rx cipro 500 mg 1 bid x 5 days #10 Take AZO Push water and cranberry juice, lite Follow up prn Review handout on UTI

## 2016-02-24 LAB — URINALYSIS, ROUTINE W REFLEX MICROSCOPIC
BILIRUBIN UA: NEGATIVE
Ketones, UA: NEGATIVE
NITRITE UA: POSITIVE — AB
Specific Gravity, UA: 1.018 (ref 1.005–1.030)
UUROB: 1 mg/dL (ref 0.2–1.0)
pH, UA: 6.5 (ref 5.0–7.5)

## 2016-02-24 LAB — MICROSCOPIC EXAMINATION: Casts: NONE SEEN /lpf

## 2016-02-25 ENCOUNTER — Telehealth: Payer: Self-pay | Admitting: Adult Health

## 2016-02-25 LAB — URINE CULTURE

## 2016-02-25 MED ORDER — SULFAMETHOXAZOLE-TRIMETHOPRIM 800-160 MG PO TABS: 1.0000 | ORAL_TABLET | Freq: Two times a day (BID) | ORAL | Status: DC

## 2016-02-25 NOTE — Telephone Encounter (Signed)
Pt aware has E coli, resistant to cirpo will rx septra ds 1 bid x 7 days, so stop cipro now

## 2016-02-26 ENCOUNTER — Telehealth: Payer: Self-pay | Admitting: Endocrinology

## 2016-02-26 NOTE — Telephone Encounter (Signed)
Will be done after her foot exam on the 17th

## 2016-02-26 NOTE — Telephone Encounter (Signed)
Noted, message left on her answering machine making her aware.

## 2016-02-26 NOTE — Telephone Encounter (Signed)
Please see below, this is on your desk.

## 2016-02-26 NOTE — Telephone Encounter (Signed)
Letter of medical necessity needs to be sent in to [facility] F# [phone]

## 2016-03-07 ENCOUNTER — Encounter: Payer: Self-pay | Admitting: Endocrinology

## 2016-03-07 ENCOUNTER — Ambulatory Visit (INDEPENDENT_AMBULATORY_CARE_PROVIDER_SITE_OTHER): Payer: Medicare Other | Admitting: Endocrinology

## 2016-03-07 ENCOUNTER — Telehealth: Payer: Self-pay | Admitting: Endocrinology

## 2016-03-07 VITALS — BP 126/66 | HR 73 | Temp 97.7°F | Resp 14 | Ht 67.0 in | Wt 235.2 lb

## 2016-03-07 DIAGNOSIS — E1142 Type 2 diabetes mellitus with diabetic polyneuropathy: Secondary | ICD-10-CM

## 2016-03-07 DIAGNOSIS — E1165 Type 2 diabetes mellitus with hyperglycemia: Secondary | ICD-10-CM

## 2016-03-07 DIAGNOSIS — Z794 Long term (current) use of insulin: Secondary | ICD-10-CM

## 2016-03-07 DIAGNOSIS — E785 Hyperlipidemia, unspecified: Secondary | ICD-10-CM | POA: Diagnosis not present

## 2016-03-07 DIAGNOSIS — E040 Nontoxic diffuse goiter: Secondary | ICD-10-CM

## 2016-03-07 DIAGNOSIS — IMO0001 Reserved for inherently not codable concepts without codable children: Secondary | ICD-10-CM

## 2016-03-07 DIAGNOSIS — E042 Nontoxic multinodular goiter: Secondary | ICD-10-CM

## 2016-03-07 LAB — BASIC METABOLIC PANEL
BUN: 27 mg/dL — AB (ref 6–23)
CALCIUM: 9.8 mg/dL (ref 8.4–10.5)
CO2: 24 meq/L (ref 19–32)
Chloride: 104 mEq/L (ref 96–112)
Creatinine, Ser: 1.32 mg/dL — ABNORMAL HIGH (ref 0.40–1.20)
GFR: 51.34 mL/min — ABNORMAL LOW (ref >60.00)
GLUCOSE: 163 mg/dL — AB (ref 70–99)
POTASSIUM: 5.3 meq/L — AB (ref 3.5–5.1)
SODIUM: 135 meq/L (ref 135–145)

## 2016-03-07 LAB — LIPID PANEL
CHOL/HDL RATIO: 3
CHOLESTEROL: 171 mg/dL (ref 0–200)
HDL: 60.4 mg/dL (ref >39.00)
LDL Cholesterol: 86 mg/dL (ref 0–99)
NonHDL: 110.79
TRIGLYCERIDES: 126 mg/dL (ref 0.0–149.0)
VLDL: 25.2 mg/dL (ref 0.0–40.0)

## 2016-03-07 LAB — T4, FREE: Free T4: 1.02 ng/dL (ref 0.60–1.60)

## 2016-03-07 LAB — TSH: TSH: 0.32 u[IU]/mL — ABNORMAL LOW (ref 0.35–4.50)

## 2016-03-07 LAB — POCT GLYCOSYLATED HEMOGLOBIN (HGB A1C): Hemoglobin A1C: 8.1

## 2016-03-07 NOTE — Patient Instructions (Addendum)
Check blood sugars on waking up 3  times a week  Also check blood sugars about 2 hours after a meal and do this after different meals by rotation  Recommended blood sugar levels on waking up is 90-130 and about 2 hours after meal is 130-160  Please bring your blood sugar monitor to each visit, thank you  Novolog 4-6 units with all meals with Carbs.

## 2016-03-07 NOTE — Telephone Encounter (Signed)
Some medicaid forms were supposed to be faxed back for this pt to [phone].  She said they were sent over on April 7th and haven't received them yet.

## 2016-03-07 NOTE — Progress Notes (Signed)
Patient ID: Brittney [name], female   DOB: 04-Feb-1946, 70 y.o.   MRN: [id number]   Reason for Appointment: Followup of diabetes  History of Present Illness    Type 2 DIABETES MELITUS, date of diagnosis: 1976      She has had long-standing diabetes taking insulin. Because of her difficulties with compliance using mealtime insulin she was given in [place] in 2011 and subsequently Victoza in addition to her Lantus and this helped her control However she tends to be erratic with her day-to-day care and has not had adequate A1c levels in the past, usually over 7%  RECENT history:   Insulin regimen: Lantus 44 units daily in a.m., NovoLog 6 units at times before mostly before lunch  Her A1c has been consistently over 8% except in 11/16 when it was improved She has not been seen in follow-up since then  Current glucose patterns and problems:  She has checked blood sugars only in the morning despite reminders to check readings after meals  Despite reminding her to take insulin with all meals that have significant carbohydrate or having larger meal size she does not understand the need to take Novolog with her breakfast or supper based on what she is eating; sometimes will eat oatmeal in the morning  She does not have any understanding of carbohydrates.  Last night had macaroni and cheese at dinnertime and did not take any insulin for this  Her A1c indicates high postprandial readings since her FASTING blood sugars are fairly consistently your normal  She was told to reduce her Lantus by 2 units previously but she has not done so  She has not been able to do any consistent walking for various reasons  Her weight is about the same She is taking Victoza with the dose of 1.8 mg without side effects   Hypoglycemia: None   Oral hypoglycemic drugs: Metformin        Side effects from medications:  Invokana:? Headache      Monitors blood glucose: 3 a day.    Glucometer: Accucheck          Blood Glucose readings from meter download:  Mean values apply above for all meters except median for One Touch  PRE-MEAL Fasting Lunch Dinner Bedtime Overall  Glucose range: 90-164       Mean/median: 127          Meals: 2-3 meals per day. . Dinner 7-8 pm, variable quantity Does have protein with breakfast consistently, has egg           Physical activity: exercise: now 3/7 walking           Wt Readings from Last 3 Encounters:  03/07/16 235 lb 3.2 oz (106.686 kg)  02/23/16 239 lb 8 oz (108.636 kg)  02/12/16 237 lb (107.502 kg)   GLYCEMIC CONTROL:  Lab Results  Component Value Date   HGBA1C 8.1 03/07/2016   HGBA1C 7.6 10/07/2015   HGBA1C 8.2* 05/06/2015   Lab Results  Component Value Date   MICROALBUR 0.7 10/07/2015   [place] 99 05/06/2015   CREATININE 0.97 10/21/2015           Medication List       This list is accurate as of: 03/07/16 11:30 AM.  Always use your most recent med list.               ACCU-CHEK AVIVA PLUS w/Device Kit  Use to check blood sugar 2 times per day dx code  E11.65     ALPRAZolam 0.5 MG tablet  Commonly known as:  XANAX  Take by mouth at bedtime. Takes half a tab at bedtime     amLODipine 10 MG tablet  Commonly known as:  NORVASC  TAKE (1) TABLET BY MOUTH ONCE DAILY.     B-D ULTRAFINE III SHORT PEN 31G X 8 MM Misc  Generic drug:  Insulin Pen Needle  USE AS DIRECTED 2-3 TIMES A DAY.     ciprofloxacin 500 MG tablet  Commonly known as:  CIPRO  Take 1 tablet (500 mg total) by mouth 2 (two) times daily.     glucose blood test strip  Commonly known as:  ACCU-CHEK AVIVA PLUS  USE TO TEST TWICE DAILY. Dx code E11.65     HUMALOG KWIKPEN 100 UNIT/ML KiwkPen  Generic drug:  insulin lispro  INJECT 6 TO 7 UNITS SUBCUTANEOUSLY BEFORE SUPPER IF GLUCOSE IS HIGH.     ibuprofen 200 MG tablet  Commonly known as:  ADVIL,MOTRIN  Take 400 mg by mouth as needed.     LANTUS SOLOSTAR 100 UNIT/ML Solostar Pen  Generic drug:  Insulin  Glargine  INJECT 44 UNITS SUBCUTANEOUSLY ONCE EVERY MORNING.     metFORMIN 850 MG tablet  Commonly known as:  GLUCOPHAGE  TAKE (1) TABLET BY MOUTH TWICE DAILY.     metoprolol 50 MG tablet  Commonly known as:  LOPRESSOR  TAKE 0.5 (ONE-HALF) TABLET BY MOUTH TWICE DAILY.     multivitamin tablet  Take 1 tablet by mouth daily.     omeprazole 20 MG capsule  Commonly known as:  PRILOSEC  Take 20 mg by mouth daily.     onetouch ultrasoft lancets     polyethylene glycol powder powder  Commonly known as:  GLYCOLAX/MIRALAX  Take 1 Container by mouth as needed for moderate constipation.     potassium chloride 10 MEQ tablet  Commonly known as:  K-DUR  Take 10 mEq by mouth daily.     pravastatin 20 MG tablet  Commonly known as:  PRAVACHOL  TAKE ONE TABLET BY MOUTH DAILY.     sulfamethoxazole-trimethoprim 800-160 MG tablet  Commonly known as:  BACTRIM DS,SEPTRA DS  Take 1 tablet by mouth 2 (two) times daily.     torsemide 20 MG tablet  Commonly known as:  DEMADEX  TAKE 1 TABLET BY MOUTH ONCE DAILY AS NEEDED.     traMADol 50 MG tablet  Commonly known as:  ULTRAM  Take 1 tablet (50 mg total) by mouth every 6 (six) hours as needed.     valsartan 320 MG tablet  Commonly known as:  DIOVAN  Take 320 mg by mouth daily.     VICTOZA 18 MG/3ML Sopn  Generic drug:  Liraglutide  INJECT 1.8 MG SUBCUTANEOUSLY ONCE A DAY AS DIRECTED.        Allergies:  Allergies  Allergen Reactions  . Latex     Itching and breaking out    Past Medical History  Diagnosis Date  . Atrial fibrillation (HCC)     Paroxysmal; LVH; nl EF; onset in 1999  . Hypertension   . Diabetes mellitus     insulin; managed by Dr. Dwyane Dee  . Hyperlipidemia   . Palpitations     negative event recorder in 2009  . Hypothyroidism     history of goiter; nl TSH off medication  . 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 Obesity 07/06/2012  . Itching with  irritation 02/27/2015  . UTI (urinary  tract infection) 02/23/2016  . Burning with urination 02/23/2016    Past Surgical History  Procedure Laterality Date  . Tubal ligation  1980's  . Cholecystectomy  11/95  . Retinal detachment surgery  2009  . Colonoscopy  2007  . Cesarean section    . Biopsy thyroid      Family History  Problem Relation Age of Onset  . Diabetes Mother   . Diabetes Daughter     gestational diabetes  . Cancer Father   . Thyroid disease Brother   . Other Son     MVA    Social History:  reports that she quit smoking about 29 years ago. Her smoking use included Cigarettes. She started smoking about 49 years ago. She smoked 1.00 pack per day. She has never used smokeless tobacco. She reports that she does not drink alcohol or use illicit drugs.  Review of Systems:   HYPERTENSION:  this is well controlled with current regimen, followed by PCP  HYPERLIPIDEMIA: The lipid abnormality consists of elevated LDL; she has been taking her Pravachol irregularly   Lab Results  Component Value Date   CHOL 190 05/06/2015   HDL 56.10 05/06/2015   LDLCALC 99 05/06/2015   TRIG 173.0* 05/06/2015   CHOLHDL 3 05/06/2015    Multinodular goiter: She has had a long-standing multinodular goiter since at least 2005 which has been fairly stable clinically She does have history of occasional palpitations for years but has been treated with metoprolol by her cardiologist for this Her goiter has been autonomous. No local pressure symptoms of choking or difficulty swallowing She does complain of occasional palpitations; did have an episode a couple of weeks ago but has not been evaluated by cardiologist for this  No evidence of overt hyperthyroidism  with normal free T4 and free T3 levels; TSH has been consistently suppressed but has not had any recent labs  Her I-131 uptake in 2003 was 17% and her  thyroid ultrasound in 2014 did not show distinct nodules but a relative increase in size  Lab Results  Component Value  Date   TSH 0.25* 10/21/2015   TSH 0.026* 05/27/2014   FREET4 1.04 10/21/2015   FREET4 1.01 09/09/2014       Examination:   BP 126/66 mmHg  Pulse 73  Temp(Src) 97.7 F (36.5 C)  Resp 14  Ht 5' 7"  (1.702 m)  Wt 235 lb 3.2 oz (106.686 kg)  BMI 36.83 kg/m2  SpO2 97%  Body mass index is 36.83 kg/(m^2).   No ankle edema Diabetic Foot Exam - Simple   Simple Foot Form  Diabetic Foot exam was performed with the following findings:  Yes   Visual Inspection  See comments:  Yes  Sensation Testing  See comments:  Yes  Pulse Check  Posterior Tibialis and Dorsalis pulse intact bilaterally:  Yes  Comments  Decreased to absent monofilament sensation on the toes and plantar surfaces, more so on the first toes Some flattening of the plantar arch. Has widening of the left proximal foot on the left lateral area      ASSESSMENT/ PLAN:  Diabetes Type 2   The patient's diabetes control is worse with A1c going up over 8% See history of present illness for detailed discussion of  current management, blood sugar patterns and problems identified  She does fairly well with her Victoza and basal insulin to get fasting readings under control although these are somewhat variable Most likely  has post prandial hyperglycemia, discussed that her A1c of 8.1 indicates her average blood sugar of over 180 She is not checking readings after meals despite discussion Also does not understand the need to cover all meals with carbohydrate with insulin and does not understand much about meal planning or carbohydrates Appears to need significant amount of reinforcement of instructions and explanations for day-to-day management of her diabetes  NEUROPATHY: She does have some sensory loss but no pre-ulcerative calluses.  Will get information from her podiatrist  Hypercholesterolemia: Lipids to be assessed on  today's visit, she states she is compliant with her pravastatin  Hypertension:   controlled  recently  MULTINODULAR goiter: Need to reassess her thyroid levels with previous history of low TSH  Patient Instructions  Check blood sugars on waking up 3  times a week  Also check blood sugars about 2 hours after a meal and do this after different meals by rotation  Recommended blood sugar levels on waking up is 90-130 and about 2 hours after meal is 130-160  Please bring your blood sugar monitor to each visit, thank you  Novolog 4-6 units with all meals with Carbs.     Counseling time on subjects discussed above is over 50% of today's 25 minute visit   Taylon Coole 03/07/2016, 11:30 AM

## 2016-03-08 NOTE — Telephone Encounter (Signed)
I have no idea what forms she's talking about.

## 2016-03-09 NOTE — Progress Notes (Signed)
Quick Note:  Please let patient know that potassium is high, needs to stop potassium supplement and see PCP for follow-up in 2 weeks ______

## 2016-03-14 ENCOUNTER — Other Ambulatory Visit: Payer: Self-pay | Admitting: Endocrinology

## 2016-03-18 ENCOUNTER — Telehealth: Payer: Self-pay | Admitting: Endocrinology

## 2016-03-18 DIAGNOSIS — E1051 Type 1 diabetes mellitus with diabetic peripheral angiopathy without gangrene: Secondary | ICD-10-CM | POA: Diagnosis not present

## 2016-03-18 DIAGNOSIS — M24875 Other specific joint derangements left foot, not elsewhere classified: Secondary | ICD-10-CM | POA: Diagnosis not present

## 2016-03-18 DIAGNOSIS — L84 Corns and callosities: Secondary | ICD-10-CM | POA: Diagnosis not present

## 2016-03-18 DIAGNOSIS — I739 Peripheral vascular disease, unspecified: Secondary | ICD-10-CM | POA: Diagnosis not present

## 2016-03-18 DIAGNOSIS — L603 Nail dystrophy: Secondary | ICD-10-CM | POA: Diagnosis not present

## 2016-03-18 NOTE — Telephone Encounter (Signed)
PT called asking to speak to you, she just requests you call her at your earliest convenience

## 2016-03-21 DIAGNOSIS — E875 Hyperkalemia: Secondary | ICD-10-CM | POA: Diagnosis not present

## 2016-03-21 DIAGNOSIS — E1122 Type 2 diabetes mellitus with diabetic chronic kidney disease: Secondary | ICD-10-CM | POA: Diagnosis not present

## 2016-03-21 DIAGNOSIS — N182 Chronic kidney disease, stage 2 (mild): Secondary | ICD-10-CM | POA: Diagnosis not present

## 2016-03-21 DIAGNOSIS — E785 Hyperlipidemia, unspecified: Secondary | ICD-10-CM | POA: Diagnosis not present

## 2016-03-21 DIAGNOSIS — Z6837 Body mass index (BMI) 37.0-37.9, adult: Secondary | ICD-10-CM | POA: Diagnosis not present

## 2016-03-28 ENCOUNTER — Other Ambulatory Visit: Payer: Self-pay | Admitting: Endocrinology

## 2016-03-30 ENCOUNTER — Other Ambulatory Visit: Payer: Medicare Other | Admitting: Adult Health

## 2016-04-15 ENCOUNTER — Other Ambulatory Visit: Payer: Self-pay | Admitting: Endocrinology

## 2016-04-20 DIAGNOSIS — E1142 Type 2 diabetes mellitus with diabetic polyneuropathy: Secondary | ICD-10-CM | POA: Diagnosis not present

## 2016-04-28 ENCOUNTER — Encounter: Payer: Self-pay | Admitting: Cardiovascular Disease

## 2016-04-28 ENCOUNTER — Ambulatory Visit (INDEPENDENT_AMBULATORY_CARE_PROVIDER_SITE_OTHER): Payer: Medicare Other | Admitting: Cardiovascular Disease

## 2016-04-28 VITALS — BP 138/66 | HR 84 | Ht 67.0 in | Wt 235.0 lb

## 2016-04-28 DIAGNOSIS — E049 Nontoxic goiter, unspecified: Secondary | ICD-10-CM

## 2016-04-28 DIAGNOSIS — I10 Essential (primary) hypertension: Secondary | ICD-10-CM | POA: Diagnosis not present

## 2016-04-28 DIAGNOSIS — I480 Paroxysmal atrial fibrillation: Secondary | ICD-10-CM | POA: Diagnosis not present

## 2016-04-28 DIAGNOSIS — R002 Palpitations: Secondary | ICD-10-CM | POA: Diagnosis not present

## 2016-04-28 NOTE — Progress Notes (Signed)
Patient ID: Brittney [name], female   DOB: [birth year], 70 y.o.   MRN: [id number]      SUBJECTIVE: The patient returns for followup for a remote history of paroxysmal atrial fibrillation. She experienced the sudden onset of palpitations about a week ago which awoke her from sleep. It lasted about 4 minutes. No associated dizziness or syncope. She has had one other episode in the last several months. She has not felt the same since. She denies chest pain and shortness of breath. She sometimes has mild leg swelling.  She has a h/o multinodular goiter, last imaged by [place] on 11/29/12. Labs 03/07/16: TSH 0.32, Free T4 1.02, K 5.3.  Follows with endocrinology.   Review of Systems: As per "subjective", otherwise negative.  Allergies  Allergen Reactions  . Latex     Itching and breaking out    Current Outpatient Prescriptions  Medication Sig Dispense Refill  . ACCU-CHEK AVIVA PLUS test strip USE TO TEST TWICE DAILY. 100 each 3  . ALPRAZolam (XANAX) 0.5 MG tablet Take by mouth at bedtime. Takes half a tab at bedtime    . amLODipine (NORVASC) 10 MG tablet TAKE (1) TABLET BY MOUTH ONCE DAILY. 30 tablet 11  . B-D ULTRAFINE III SHORT PEN 31G X 8 MM MISC USE AS DIRECTED 2-3 TIMES A DAY. 100 each 2  . Blood Glucose Monitoring Suppl (ACCU-CHEK AVIVA PLUS) W/DEVICE KIT Use to check blood sugar 2 times per day dx code E11.65 1 kit 0  . ciprofloxacin (CIPRO) 500 MG tablet Take 1 tablet (500 mg total) by mouth 2 (two) times daily. 10 tablet 0  . HUMALOG KWIKPEN 100 UNIT/ML KiwkPen INJECT 6 TO 7 UNITS SUBCUTANEOUSLY BEFORE SUPPER IF GLUCOSE IS HIGH. 15 mL 2  . ibuprofen (ADVIL,MOTRIN) 200 MG tablet Take 400 mg by mouth as needed.    . Lancets (ONETOUCH ULTRASOFT) lancets     . LANTUS SOLOSTAR 100 UNIT/ML Solostar Pen INJECT 44 UNITS SUBCUTANEOUSLY ONCE EVERY MORNING. 15 mL 2  . metFORMIN (GLUCOPHAGE) 850 MG tablet TAKE (1) TABLET BY MOUTH TWICE DAILY. 60 tablet 2  . metoprolol (LOPRESSOR) 50 MG tablet  TAKE 0.5 (ONE-HALF) TABLET BY MOUTH TWICE DAILY. 30 tablet 11  . Multiple Vitamin (MULTIVITAMIN) tablet Take 1 tablet by mouth daily.    Marland Kitchen omeprazole (PRILOSEC) 20 MG capsule Take 20 mg by mouth daily.      . polyethylene glycol powder (GLYCOLAX/MIRALAX) powder Take 1 Container by mouth as needed for moderate constipation.     . potassium chloride (K-DUR) 10 MEQ tablet Take 10 mEq by mouth daily.     . pravastatin (PRAVACHOL) 20 MG tablet TAKE ONE TABLET BY MOUTH DAILY. 30 tablet 2  . sulfamethoxazole-trimethoprim (BACTRIM DS,SEPTRA DS) 800-160 MG tablet Take 1 tablet by mouth 2 (two) times daily. 14 tablet 0  . torsemide (DEMADEX) 20 MG tablet TAKE 1 TABLET BY MOUTH ONCE DAILY AS NEEDED. 30 tablet 6  . traMADol (ULTRAM) 50 MG tablet Take 1 tablet (50 mg total) by mouth every 6 (six) hours as needed. 15 tablet 0  . valsartan (DIOVAN) 320 MG tablet Take 320 mg by mouth daily.      Marland Kitchen VICTOZA 18 MG/3ML SOPN INJECT 1.8 MG SUBCUTANEOUSLY ONCE A DAY AS DIRECTED. 9 mL 2   No current facility-administered medications for this visit.    Past Medical History  Diagnosis Date  . Atrial fibrillation (HCC)     Paroxysmal; LVH; nl EF; onset in 1999  . Hypertension   .  Diabetes mellitus     insulin; managed by Dr. Dwyane Dee  . Hyperlipidemia   . Palpitations     negative event recorder in 2009  . Hypothyroidism     history of goiter; nl TSH off medication  . 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 Obesity 07/06/2012  . Itching with irritation 02/27/2015  . UTI (urinary tract infection) 02/23/2016  . Burning with urination 02/23/2016    Past Surgical History  Procedure Laterality Date  . Tubal ligation  1980's  . Cholecystectomy  11/95  . Retinal detachment surgery  2009  . Colonoscopy  2007  . Cesarean section    . Biopsy thyroid      Social History   Social History  . Marital Status: Married    Spouse Name: N/A  . Number of Children: N/A  . Years of Education: N/A     Occupational History  . Retired    Social History Main Topics  . Smoking status: Former Smoker -- 1.00 packs/day    Types: Cigarettes    Start date: 11/21/1966    Quit date: 08/13/1986  . Smokeless tobacco: Never Used  . Alcohol Use: No  . Drug Use: No  . Sexual Activity: No   Other Topics Concern  . Not on file   Social History Narrative   No regular exercise     Filed Vitals:   04/28/16 1322  BP: 138/66  Pulse: 84  Height: 5' 7" (1.702 m)  Weight: 235 lb (106.595 kg)  SpO2: 97%    PHYSICAL EXAM General: NAD HEENT: Normal. Neck: No JVD, + thyromegaly. Lungs: Clear to auscultation bilaterally with normal respiratory effort. CV: Nondisplaced PMI.  Regular rate and rhythm, normal S1/S2, no S3/S4, no murmur. Trace pretibial edema.     Abdomen: Soft, nontender, obese.  Neurologic: Alert and oriented.  Psych: Normal affect. Skin: Normal. Musculoskeletal: No gross deformities.    ECG: Most recent ECG reviewed.      ASSESSMENT AND PLAN: 1. Palpitations with a h/o paroxysmal atrial fibrillation: She has a very remote history, and thus has never been on anticoagulation. She has been having more frequent palpitations lately. Continue metoprolol at present dose for now.  I will proceed with 30-day event monitoring. I will order a 2-D echocardiogram with Doppler to evaluate cardiac structure, function, and regional wall motion. Will check TSH and free T4 given her h/o multinodular goiter. If she were to have documented recurrences, given her history of HTN and diabetes, she would warrant anticoagulation.  2. Essential HTN: Controlled. No changes.  3. Hyperlipidemia: Lipid panel on 03/07/2016 demonstrated total cholesterol 171, triglycerides 126, HDL 60, LDL 86. On pravastatin 40 mg.  4. Multinodular goiter: Check TSH and free T4.  Dispo: f/u 2 months.  Kate Sable, M.D., F.A.C.C.

## 2016-04-28 NOTE — Patient Instructions (Signed)
Your physician recommends that you schedule a follow-up appointment in:  2 months    Get lab work today   Your physician has requested that you have an echocardiogram. Echocardiography is a painless test that uses sound waves to create images of your heart. It provides your doctor with information about the size and shape of your heart and how well your heart's chambers and valves are working. This procedure takes approximately one hour. There are no restrictions for this procedure.    Your physician has recommended that you wear an event monitor. Event monitors are medical devices that record the heart's electrical activity. Doctors most often [place] these monitors to diagnose arrhythmias. Arrhythmias are problems with the speed or rhythm of the heartbeat. The monitor is a small, portable device. You can wear one while you do your normal daily activities. This is usually used to diagnose what is causing palpitations/syncope (passing out).    Your physician recommends that you continue on your current medications as directed. Please refer to the Current Medication list given to you today.     Thank you for choosing [facility] !

## 2016-04-29 LAB — T4, FREE: FREE T4: 1.4 ng/dL (ref 0.8–1.8)

## 2016-04-29 LAB — TSH: TSH: <0.01 mIU/L — ABNORMAL LOW

## 2016-05-04 ENCOUNTER — Other Ambulatory Visit: Payer: Self-pay | Admitting: Endocrinology

## 2016-05-04 ENCOUNTER — Ambulatory Visit (INDEPENDENT_AMBULATORY_CARE_PROVIDER_SITE_OTHER): Payer: Medicare Other

## 2016-05-04 DIAGNOSIS — R002 Palpitations: Secondary | ICD-10-CM

## 2016-05-04 DIAGNOSIS — I480 Paroxysmal atrial fibrillation: Secondary | ICD-10-CM

## 2016-05-05 ENCOUNTER — Ambulatory Visit (HOSPITAL_COMMUNITY)
Admission: RE | Admit: 2016-05-05 | Discharge: 2016-05-05 | Disposition: A | Discharge status: Home / Self Care | Payer: Medicare Other | Source: Ambulatory Visit | Attending: Cardiovascular Disease | Admitting: Cardiovascular Disease

## 2016-05-05 DIAGNOSIS — I10 Essential (primary) hypertension: Secondary | ICD-10-CM | POA: Insufficient documentation

## 2016-05-05 DIAGNOSIS — E785 Hyperlipidemia, unspecified: Secondary | ICD-10-CM | POA: Insufficient documentation

## 2016-05-05 DIAGNOSIS — E119 Type 2 diabetes mellitus without complications: Secondary | ICD-10-CM | POA: Insufficient documentation

## 2016-05-05 DIAGNOSIS — I480 Paroxysmal atrial fibrillation: Secondary | ICD-10-CM | POA: Diagnosis not present

## 2016-05-05 DIAGNOSIS — I4891 Unspecified atrial fibrillation: Secondary | ICD-10-CM | POA: Diagnosis present

## 2016-05-05 LAB — ECHOCARDIOGRAM COMPLETE
CHL CUP MV DEC (S): 215
CHL CUP STROKE VOLUME: 41 mL
EERAT: 8.33
EWDT: 215 ms
FS: 38 % (ref 28–44)
IV/PV OW: 0.92
LA diam index: 1.44 cm/m2
LA vol: 55.1 mL
LASIZE: 33 mm
LAVOLA4C: 62.9 mL
LAVOLIN: 24.1 mL/m2
LDCA: 3.8 cm2
LEFT ATRIUM END SYS DIAM: 33 mm
LV PW d: 9.4 mm — AB (ref 0.6–1.1)
LV SIMPSON'S DISK: 58
LV sys vol index: 13 mL/m2
LVDIAVOL: 70 mL (ref 46–106)
LVDIAVOLIN: 31 mL/m2
LVEEAVG: 8.33
LVEEMED: 8.33
LVELAT: 9.03 cm/s
LVOT diameter: 22 mm
LVSYSVOL: 29 mL (ref 14–42)
MV pk A vel: 83.9 m/s
MV pk E vel: 75.2 m/s
MVPG: 2 mmHg
RV LATERAL S' VELOCITY: 12 cm/s
TAPSE: 18.4 mm
TDI e' lateral: 9.03
TDI e' medial: 6.09

## 2016-05-09 ENCOUNTER — Encounter: Payer: Self-pay | Admitting: Endocrinology

## 2016-05-09 ENCOUNTER — Ambulatory Visit (INDEPENDENT_AMBULATORY_CARE_PROVIDER_SITE_OTHER): Payer: Medicare Other | Admitting: Endocrinology

## 2016-05-09 VITALS — BP 138/64 | HR 80 | Wt 235.0 lb

## 2016-05-09 DIAGNOSIS — E1165 Type 2 diabetes mellitus with hyperglycemia: Secondary | ICD-10-CM | POA: Diagnosis not present

## 2016-05-09 DIAGNOSIS — IMO0001 Reserved for inherently not codable concepts without codable children: Secondary | ICD-10-CM

## 2016-05-09 DIAGNOSIS — Z794 Long term (current) use of insulin: Secondary | ICD-10-CM | POA: Diagnosis not present

## 2016-05-09 DIAGNOSIS — E0590 Thyrotoxicosis, unspecified without thyrotoxic crisis or storm: Secondary | ICD-10-CM

## 2016-05-09 LAB — COMPREHENSIVE METABOLIC PANEL
ALBUMIN: 3.9 g/dL (ref 3.5–5.2)
ALK PHOS: 100 U/L (ref 39–117)
ALT: 12 U/L (ref 0–35)
AST: 13 U/L (ref 0–37)
BILIRUBIN TOTAL: 0.3 mg/dL (ref 0.2–1.2)
BUN: 21 mg/dL (ref 6–23)
CALCIUM: 9.5 mg/dL (ref 8.4–10.5)
CO2: 24 mEq/L (ref 19–32)
Chloride: 102 mEq/L (ref 96–112)
Creatinine, Ser: 1.01 mg/dL (ref 0.40–1.20)
GFR: 69.89 mL/min (ref >60.00)
GLUCOSE: 280 mg/dL — AB (ref 70–99)
Potassium: 4.3 mEq/L (ref 3.5–5.1)
Sodium: 136 mEq/L (ref 135–145)
TOTAL PROTEIN: 7.5 g/dL (ref 6.0–8.3)

## 2016-05-09 LAB — T3, FREE: T3, Free: 3.3 pg/mL (ref 2.3–4.2)

## 2016-05-09 LAB — T4, FREE: FREE T4: 1.02 ng/dL (ref 0.60–1.60)

## 2016-05-09 NOTE — Progress Notes (Addendum)
Patient ID: Brittney [name], female   DOB: 10/25/1946, 70 y.o.   MRN: [id number]   Reason for Appointment: Followup of diabetes  History of Present Illness    Type 2 DIABETES MELITUS, date of diagnosis: 1976      She has had long-standing diabetes taking insulin. Because of her difficulties with compliance using mealtime insulin she was given in [place] in 2011 and subsequently Victoza in addition to her Lantus and this helped her control However she tends to be erratic with her day-to-day care and has not had adequate A1c levels in the past, usually over 7%  RECENT history:   Insulin regimen: Lantus 44 units daily in a.m., NovoLog 6 units at times before mostly before lunch  Her A1c has been consistently over 8% except in 11/16 when it was improved  Current glucose patterns and problems:  She has checked blood sugars mostly only in the morning despite reminders to check readings after meals  Despite reminding her to take Novolog insulin with all meals unless they are low in carbohydrate she is still forgetting to take her Novolog.  Also does not understand that even if blood sugars are fairly good before a meal she needs to NovoLog to cover the meal itself  FASTING blood sugars are generally fairly good  She has a few readings late at night or bedtime that are significantly high without insulin coverage   He usually does have some carbohydrate at breakfast but may not take insulin unless blood sugars high fasting  She has not been able to do any consistent walking for exercise  Her weight is about the same She is taking Victoza with the dose of 1.8 mg without side effects   Hypoglycemia: None   Oral hypoglycemic drugs: Metformin        Side effects from medications:  Invokana:? Headache      Monitors blood glucose: 3 a day.    Glucometer: Accucheck         Blood Glucose readings from meter download:  Mean values apply above for all meters except median for One  Touch  PRE-MEAL Fasting 10 AM-12 noon  Dinner Bedtime Overall  Glucose range: 90-164  128-199  66-146  157-212    Mean/median: 127    186  127      Meals: 2-3 meals per day. . Dinner 7-8 pm, variable quantity Does have protein with breakfast consistently, has egg, Oatmeal           Physical activity: exercise: Walking on and off        Wt Readings from Last 3 Encounters:  05/09/16 235 lb (106.595 kg)  04/28/16 235 lb (106.595 kg)  03/07/16 235 lb 3.2 oz (106.686 kg)   GLYCEMIC CONTROL:  Lab Results  Component Value Date   HGBA1C 8.1 03/07/2016   HGBA1C 7.6 10/07/2015   HGBA1C 8.2* 05/06/2015   Lab Results  Component Value Date   MICROALBUR 0.7 10/07/2015   LDLCALC 86 03/07/2016   CREATININE 1.01 05/09/2016           Medication List       This list is accurate as of: 05/09/16 11:59 PM.  Always use your most recent med list.               ACCU-CHEK AVIVA PLUS test strip  Generic drug:  glucose blood  USE TO TEST TWICE DAILY.     ACCU-CHEK AVIVA PLUS w/Device Kit  Use to check blood sugar  2 times per day dx code E11.65     ALPRAZolam 0.5 MG tablet  Commonly known as:  XANAX  Take by mouth at bedtime. Takes half a tab at bedtime     amLODipine 10 MG tablet  Commonly known as:  NORVASC  TAKE (1) TABLET BY MOUTH ONCE DAILY.     B-D ULTRAFINE III SHORT PEN 31G X 8 MM Misc  Generic drug:  Insulin Pen Needle  USE AS DIRECTED 2-3 TIMES A DAY.     ciprofloxacin 500 MG tablet  Commonly known as:  CIPRO  Take 1 tablet (500 mg total) by mouth 2 (two) times daily.     HUMALOG KWIKPEN 100 UNIT/ML KiwkPen  Generic drug:  insulin lispro  INJECT 6 TO 7 UNITS SUBCUTANEOUSLY BEFORE SUPPER IF GLUCOSE IS HIGH.     ibuprofen 200 MG tablet  Commonly known as:  ADVIL,MOTRIN  Take 400 mg by mouth as needed.     LANTUS SOLOSTAR 100 UNIT/ML Solostar Pen  Generic drug:  Insulin Glargine  INJECT 44 UNITS SUBCUTANEOUSLY ONCE EVERY MORNING.     metFORMIN 850 MG tablet   Commonly known as:  GLUCOPHAGE  TAKE (1) TABLET BY MOUTH TWICE DAILY.     metoprolol 50 MG tablet  Commonly known as:  LOPRESSOR  TAKE 0.5 (ONE-HALF) TABLET BY MOUTH TWICE DAILY.     multivitamin tablet  Take 1 tablet by mouth daily.     omeprazole 20 MG capsule  Commonly known as:  PRILOSEC  Take 20 mg by mouth daily.     onetouch ultrasoft lancets     polyethylene glycol powder powder  Commonly known as:  GLYCOLAX/MIRALAX  Take 1 Container by mouth as needed for moderate constipation.     potassium chloride 10 MEQ tablet  Commonly known as:  K-DUR  Take 10 mEq by mouth daily.     pravastatin 20 MG tablet  Commonly known as:  PRAVACHOL  TAKE ONE TABLET BY MOUTH DAILY.     sulfamethoxazole-trimethoprim 800-160 MG tablet  Commonly known as:  BACTRIM DS,SEPTRA DS  Take 1 tablet by mouth 2 (two) times daily.     torsemide 20 MG tablet  Commonly known as:  DEMADEX  TAKE 1 TABLET BY MOUTH ONCE DAILY AS NEEDED.     traMADol 50 MG tablet  Commonly known as:  ULTRAM  Take 1 tablet (50 mg total) by mouth every 6 (six) hours as needed.     valsartan 320 MG tablet  Commonly known as:  DIOVAN  Take 320 mg by mouth daily.     VICTOZA 18 MG/3ML Sopn  Generic drug:  Liraglutide  INJECT 1.8 MG SUBCUTANEOUSLY ONCE A DAY AS DIRECTED.        Allergies:  Allergies  Allergen Reactions  . Latex     Itching and breaking out    Past Medical History  Diagnosis Date  . Atrial fibrillation (HCC)     Paroxysmal; LVH; nl EF; onset in 1999  . Hypertension   . Diabetes mellitus     insulin; managed by Dr. Dwyane Dee  . Hyperlipidemia   . Palpitations     negative event recorder in 2009  . Hypothyroidism     history of goiter; nl TSH off medication  . 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 Obesity 07/06/2012  . Itching with irritation 02/27/2015  . UTI (urinary tract infection) 02/23/2016  . Burning with urination 02/23/2016    Past  Surgical History   Procedure Laterality Date  . Tubal ligation  1980's  . Cholecystectomy  11/95  . Retinal detachment surgery  2009  . Colonoscopy  2007  . Cesarean section    . Biopsy thyroid      Family History  Problem Relation Age of Onset  . Diabetes Mother   . Diabetes Daughter     gestational diabetes  . Cancer Father   . Thyroid disease Brother   . Other Son     MVA    Social History:  reports that she quit smoking about 29 years ago. Her smoking use included Cigarettes. She started smoking about 49 years ago. She smoked 1.00 pack per day. She has never used smokeless tobacco. She reports that she does not drink alcohol or use illicit drugs.  Review of Systems:   HYPERTENSION:  this is well controlled with current regimen, followed by PCP  HYPERLIPIDEMIA: The lipid abnormality consists of elevated LDL; she has been taking her Pravachol irregularly   Lab Results  Component Value Date   CHOL 171 03/07/2016   HDL 60.40 03/07/2016   LDLCALC 86 03/07/2016   TRIG 126.0 03/07/2016   CHOLHDL 3 03/07/2016    Multinodular goiter: She has had a long-standing multinodular goiter since at least 2005 which has been fairly stable clinically She does have history of occasional palpitations for years but has been treated with metoprolol by her cardiologist for this Her goiter has been autonomous. No local pressure symptoms of choking or difficulty swallowing  She does complain of palpitations; did have an Evaluation by cardiologist and is getting her monitor Does not complain of any heat intolerance, sweating, shakiness  No evidence of overt hyperthyroidism  with normal free T4 and free T3 levels consistently TSH has been consistently suppressed and is referred here for further evaluation by cardiologist today  Her I-131 uptake in 2003 was 17% and her  thyroid ultrasound in 2014 did not show distinct nodules but a relative increase in size  Lab Results  Component Value Date   TSH <0.01*  04/28/2016   TSH 0.32* 03/07/2016   FREET4 1.02 05/09/2016   FREET4 1.4 04/28/2016       Examination:   BP 138/64 mmHg  Pulse 80  Wt 235 lb (106.595 kg)  SpO2 97%  Body mass index is 36.8 kg/(m^2).   1+ pedal edema present  Neck circumference 41 cm She has a large multinodular goiter, relatively firm and mostly on the right side Reflexes are normal No tremor  ASSESSMENT/ PLAN:  Diabetes Type 2   The patient's diabetes control is difficult to assess since she has not checked readings after meals and mostly in the morning which are usually well controlled with Lantus See history of present illness for detailed discussion of  current management, blood sugar patterns and problems identified  She does fairly well with her Victoza and basal insulin but still does not take  Novolog as directed She has some readings after supper which are significantly high and may depend on her meal size and carbohydrate intake She frequently has higher readings in the office postprandially when she has a lab checked Since A1c is usually 8% or more she likely continues to have postprandial hyperglycemia  Again emphasized the need to cover her meals with Novolog and that she is eating very low carbohydrate, most likely take  try 46 units based on meal size  Hypertension:   controlled   MULTINODULAR goiter: She usually has  an autonomous thyroid without overt hyperthyroidism Need to reassess her free T3 and free T4 thyroid levels with history of relatively low TSH on this visit Since she has had a relatively low I-131 uptake in the past it is unlikely that she can be treated with I-131  There are no Patient Instructions on file for this visit.  Counseling time on subjects discussed above is over 50% of today's 25 minute visit   Nemiah Kissner 05/10/2016, 8:03 AM   Addendum: Free T4 and free T3 are unchanged Fructosamine 315 indicating overall hyperglycemic state, postprandial glucose was 280, patient  will be informed

## 2016-05-09 NOTE — Progress Notes (Signed)
Patient ID: Brittney [name], female   DOB: Dec 12, 1945, 70 y.o.   MRN: [id number]   Reason for Appointment: Followup of diabetes  History of Present Illness    Type 2 DIABETES MELITUS, date of diagnosis: 1976      She has had long-standing diabetes taking insulin. Because of her difficulties with compliance using mealtime insulin she was given in [place] in 2011 and subsequently Victoza in addition to her Lantus and this helped her control However she tends to be erratic with her day-to-day care and has not had adequate A1c levels in the past, usually over 7%  RECENT history:   Insulin regimen: Lantus 44 units daily in a.m., NovoLog 6 units at times before mostly before lunch  Her A1c has been consistently over 8% except in 11/16 when it was improved She has not been seen in follow-up since then  Current glucose patterns and problems:  She has checked blood sugars only in the morning despite reminders to check readings after meals  Despite reminding her to take insulin with all meals that have significant carbohydrate or having larger meal size she does not understand the need to take Novolog with her breakfast or supper based on what she is eating; sometimes will eat oatmeal in the morning  She does not have any understanding of carbohydrates.  Last night had macaroni and cheese at dinnertime and did not take any insulin for this  Her A1c indicates high postprandial readings since her FASTING blood sugars are fairly consistently your normal  She was told to reduce her Lantus by 2 units previously but she has not done so  She has not been able to do any consistent walking for various reasons  Her weight is about the same She is taking Victoza with the dose of 1.8 mg without side effects   Hypoglycemia:    Oral hypoglycemic drugs: Metformin        Side effects from medications:  Invokana:? Headache      Monitors blood glucose: 3 a day.    Glucometer: Accucheck           Blood Glucose readings from meter download:  Mean values apply above for all meters except median for One Touch  PRE-MEAL Fasting Lunch Dinner Bedtime Overall  Glucose range:       Mean/median:        POST-MEAL PC Breakfast PC Lunch PC Dinner  Glucose range:     Mean/median:        Mean values apply above for all meters except median for One Touch  PRE-MEAL Fasting Lunch Dinner Bedtime Overall  Glucose range: 90-164       Mean/median: 127          Meals: 2-3 meals per day. . Dinner 7-8 pm, variable quantity Does have protein with breakfast consistently, has egg;           Physical activity: exercise: now 3/7 walking           Wt Readings from Last 3 Encounters:  05/09/16 235 lb (106.595 kg)  04/28/16 235 lb (106.595 kg)  03/07/16 235 lb 3.2 oz (106.686 kg)   GLYCEMIC CONTROL:  Lab Results  Component Value Date   HGBA1C 8.1 03/07/2016   HGBA1C 7.6 10/07/2015   HGBA1C 8.2* 05/06/2015   Lab Results  Component Value Date   MICROALBUR 0.7 10/07/2015   LDLCALC 86 03/07/2016   CREATININE 1.32* 03/07/2016  Medication List       This list is accurate as of: 05/09/16  9:00 AM.  Always use your most recent med list.               ACCU-CHEK AVIVA PLUS test strip  Generic drug:  glucose blood  USE TO TEST TWICE DAILY.     ACCU-CHEK AVIVA PLUS w/Device Kit  Use to check blood sugar 2 times per day dx code E11.65     ALPRAZolam 0.5 MG tablet  Commonly known as:  XANAX  Take by mouth at bedtime. Takes half a tab at bedtime     amLODipine 10 MG tablet  Commonly known as:  NORVASC  TAKE (1) TABLET BY MOUTH ONCE DAILY.     B-D ULTRAFINE III SHORT PEN 31G X 8 MM Misc  Generic drug:  Insulin Pen Needle  USE AS DIRECTED 2-3 TIMES A DAY.     ciprofloxacin 500 MG tablet  Commonly known as:  CIPRO  Take 1 tablet (500 mg total) by mouth 2 (two) times daily.     HUMALOG KWIKPEN 100 UNIT/ML KiwkPen  Generic drug:  insulin lispro  INJECT 6 TO 7 UNITS  SUBCUTANEOUSLY BEFORE SUPPER IF GLUCOSE IS HIGH.     ibuprofen 200 MG tablet  Commonly known as:  ADVIL,MOTRIN  Take 400 mg by mouth as needed.     LANTUS SOLOSTAR 100 UNIT/ML Solostar Pen  Generic drug:  Insulin Glargine  INJECT 44 UNITS SUBCUTANEOUSLY ONCE EVERY MORNING.     metFORMIN 850 MG tablet  Commonly known as:  GLUCOPHAGE  TAKE (1) TABLET BY MOUTH TWICE DAILY.     metoprolol 50 MG tablet  Commonly known as:  LOPRESSOR  TAKE 0.5 (ONE-HALF) TABLET BY MOUTH TWICE DAILY.     multivitamin tablet  Take 1 tablet by mouth daily.     omeprazole 20 MG capsule  Commonly known as:  PRILOSEC  Take 20 mg by mouth daily.     onetouch ultrasoft lancets     polyethylene glycol powder powder  Commonly known as:  GLYCOLAX/MIRALAX  Take 1 Container by mouth as needed for moderate constipation.     potassium chloride 10 MEQ tablet  Commonly known as:  K-DUR  Take 10 mEq by mouth daily.     pravastatin 20 MG tablet  Commonly known as:  PRAVACHOL  TAKE ONE TABLET BY MOUTH DAILY.     sulfamethoxazole-trimethoprim 800-160 MG tablet  Commonly known as:  BACTRIM DS,SEPTRA DS  Take 1 tablet by mouth 2 (two) times daily.     torsemide 20 MG tablet  Commonly known as:  DEMADEX  TAKE 1 TABLET BY MOUTH ONCE DAILY AS NEEDED.     traMADol 50 MG tablet  Commonly known as:  ULTRAM  Take 1 tablet (50 mg total) by mouth every 6 (six) hours as needed.     valsartan 320 MG tablet  Commonly known as:  DIOVAN  Take 320 mg by mouth daily.     VICTOZA 18 MG/3ML Sopn  Generic drug:  Liraglutide  INJECT 1.8 MG SUBCUTANEOUSLY ONCE A DAY AS DIRECTED.        Allergies:  Allergies  Allergen Reactions  . Latex     Itching and breaking out    Past Medical History  Diagnosis Date  . Atrial fibrillation (HCC)     Paroxysmal; LVH; nl EF; onset in 1999  . Hypertension   . Diabetes mellitus     insulin; managed by Dr.  Shaylon Aden  . Hyperlipidemia   . Palpitations     negative event recorder  in 2009  . Hypothyroidism     history of goiter; nl TSH off medication  . 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 Obesity 07/06/2012  . Itching with irritation 02/27/2015  . UTI (urinary tract infection) 02/23/2016  . Burning with urination 02/23/2016    Past Surgical History  Procedure Laterality Date  . Tubal ligation  1980's  . Cholecystectomy  11/95  . Retinal detachment surgery  2009  . Colonoscopy  2007  . Cesarean section    . Biopsy thyroid      Family History  Problem Relation Age of Onset  . Diabetes Mother   . Diabetes Daughter     gestational diabetes  . Cancer Father   . Thyroid disease Brother   . Other Son     MVA    Social History:  reports that she quit smoking about 29 years ago. Her smoking use included Cigarettes. She started smoking about 49 years ago. She smoked 1.00 pack per day. She has never used smokeless tobacco. She reports that she does not drink alcohol or use illicit drugs.  Review of Systems:  HYPERTENSION:  this is well controlled with current regimen, followed by PCP  HYPERLIPIDEMIA: The lipid abnormality consists of elevated LDL; she has been taking her Pravachol irregularly   Lab Results  Component Value Date   CHOL 171 03/07/2016   HDL 60.40 03/07/2016   LDLCALC 86 03/07/2016   TRIG 126.0 03/07/2016   CHOLHDL 3 03/07/2016    Multinodular goiter: She has had a long-standing multinodular goiter since at least 2005 which has been fairly stable clinically She does have history of occasional palpitations for years but has been treated with metoprolol by her cardiologist for this Her goiter has been autonomous. No local pressure symptoms of choking or difficulty swallowing She does complain of occasional palpitations; did have an episode a couple of weeks ago but has not been evaluated by cardiologist for this  No evidence of overt hyperthyroidism  with normal free T4 and free T3 levels; TSH has been  consistently suppressed but has not had any recent labs  Her I-131 uptake in 2003 was 17% and her  thyroid ultrasound in 2014 did not show distinct nodules but a relative increase in size  Lab Results  Component Value Date   TSH <0.01* 04/28/2016   TSH 0.32* 03/07/2016   FREET4 1.4 04/28/2016   FREET4 1.02 03/07/2016       Examination:   BP 138/64 mmHg  Pulse 80  Wt 235 lb (106.595 kg)  SpO2 97%  Body mass index is 36.8 kg/(m^2).   41 cm  ASSESSMENT/ PLAN:  Diabetes Type 2   The patient's diabetes control is worse with A1c going up over 8% See history of present illness for detailed discussion of  current management, blood sugar patterns and problems identified  She does fairly well with her Victoza and basal insulin to get fasting readings under control although these are somewhat variable Most likely has post prandial hyperglycemia, discussed that her A1c of 8.1 indicates her average blood sugar of over 180 She is not checking readings after meals despite discussion Also does not understand the need to cover all meals with carbohydrate with insulin and does not understand much about meal planning or carbohydrates Appears to need significant amount of reinforcement of instructions and explanations for day-to-day  management of her diabetes  NEUROPATHY: She does have some sensory loss but no pre-ulcerative calluses.  Will get information from her podiatrist  Hypercholesterolemia: Lipids to be assessed on  today's visit, she states she is compliant with her pravastatin  Hypertension:   controlled recently  MULTINODULAR goiter: Need to reassess her thyroid levels with previous history of low TSH  There are no Patient Instructions on file for this visit. Counseling time on subjects discussed above is over 50% of today's 25 minute visit   Brittney [name] 05/09/2016, 9:00 AM

## 2016-05-10 LAB — FRUCTOSAMINE: Fructosamine: 315 umol/L — ABNORMAL HIGH (ref 0–285)

## 2016-05-16 ENCOUNTER — Other Ambulatory Visit: Payer: Self-pay | Admitting: Endocrinology

## 2016-05-28 ENCOUNTER — Encounter (HOSPITAL_COMMUNITY): Payer: Self-pay | Admitting: *Deleted

## 2016-05-28 ENCOUNTER — Emergency Department (HOSPITAL_COMMUNITY)
Admission: EM | Admit: 2016-05-28 | Discharge: 2016-05-28 | Disposition: A | Discharge status: Home / Self Care | Payer: Medicare Other | Attending: Emergency Medicine | Admitting: Emergency Medicine

## 2016-05-28 ENCOUNTER — Emergency Department (HOSPITAL_COMMUNITY): Payer: Medicare Other

## 2016-05-28 DIAGNOSIS — E039 Hypothyroidism, unspecified: Secondary | ICD-10-CM | POA: Diagnosis not present

## 2016-05-28 DIAGNOSIS — I10 Essential (primary) hypertension: Secondary | ICD-10-CM | POA: Insufficient documentation

## 2016-05-28 DIAGNOSIS — Z794 Long term (current) use of insulin: Secondary | ICD-10-CM | POA: Diagnosis not present

## 2016-05-28 DIAGNOSIS — Z791 Long term (current) use of non-steroidal anti-inflammatories (NSAID): Secondary | ICD-10-CM | POA: Diagnosis not present

## 2016-05-28 DIAGNOSIS — M79674 Pain in right toe(s): Secondary | ICD-10-CM | POA: Insufficient documentation

## 2016-05-28 DIAGNOSIS — I4891 Unspecified atrial fibrillation: Secondary | ICD-10-CM | POA: Insufficient documentation

## 2016-05-28 DIAGNOSIS — Z79899 Other long term (current) drug therapy: Secondary | ICD-10-CM | POA: Diagnosis not present

## 2016-05-28 DIAGNOSIS — E119 Type 2 diabetes mellitus without complications: Secondary | ICD-10-CM | POA: Diagnosis not present

## 2016-05-28 DIAGNOSIS — E785 Hyperlipidemia, unspecified: Secondary | ICD-10-CM | POA: Diagnosis not present

## 2016-05-28 DIAGNOSIS — Z87891 Personal history of nicotine dependence: Secondary | ICD-10-CM | POA: Insufficient documentation

## 2016-05-28 DIAGNOSIS — S99921A Unspecified injury of right foot, initial encounter: Secondary | ICD-10-CM | POA: Diagnosis not present

## 2016-05-28 LAB — URIC ACID: URIC ACID, SERUM: 6.2 mg/dL (ref 2.3–6.6)

## 2016-05-28 MED ORDER — DIPHENHYDRAMINE HCL 25 MG PO TABS: 25.0000 mg | ORAL_TABLET | ORAL | Status: DC | PRN

## 2016-05-28 MED ORDER — DIPHENHYDRAMINE HCL 25 MG PO CAPS
25.0000 mg | ORAL_CAPSULE | Freq: Once | ORAL | Status: AC
  Administered 2016-05-28: 25 mg via ORAL
  Filled 2016-05-28: qty 1

## 2016-05-28 MED ORDER — HYDROCODONE-ACETAMINOPHEN 5-325 MG PO TABS: ORAL_TABLET | ORAL | Status: DC

## 2016-05-28 NOTE — ED Notes (Addendum)
Pt states she is having itching, burning, and pain in her right foot great toe. Pt denies any injury. Pt does have a patch of redness noted to outer great toe.   Pt states she just got a new pair of shoes and has been wearing those the last week.

## 2016-05-31 NOTE — ED Provider Notes (Signed)
CSN: [id number]     Arrival date & time 05/28/16  1343 History   First MD Initiated Contact with Patient 05/28/16 1406     Chief Complaint  Patient presents with  . Foot Pain     (Consider location/radiation/quality/duration/timing/severity/associated sxs/prior Treatment) HPI  Brittney [name] is a 69 y.o. female who presents to the Emergency Department complaining of itching, burning and pain to the right great toe.  She notes that symptoms are isolated to the medial side of the toe and associated with wearing a new pair of shoes.  Symptoms have been present for several days.  She denies swelling, injury, numbness, or color change.    Past Medical History  Diagnosis Date  . Atrial fibrillation (HCC)     Paroxysmal; LVH; nl EF; onset in 1999  . Hypertension   . Diabetes mellitus     insulin; managed by Dr. Dwyane Dee  . Hyperlipidemia   . Palpitations     negative event recorder in 2009  . Hypothyroidism     history of goiter; nl TSH off medication  . 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 Obesity 07/06/2012  . Itching with irritation 02/27/2015  . UTI (urinary tract infection) 02/23/2016  . Burning with urination 02/23/2016   Past Surgical History  Procedure Laterality Date  . Tubal ligation  1980's  . Cholecystectomy  11/95  . Retinal detachment surgery  2009  . Colonoscopy  2007  . Cesarean section    . Biopsy thyroid     Family History  Problem Relation Age of Onset  . Diabetes Mother   . Diabetes Daughter     gestational diabetes  . Cancer Father   . Thyroid disease Brother   . Other Son     MVA   Social History  Substance Use Topics  . Smoking status: Former Smoker -- 1.00 packs/day    Types: Cigarettes    Start date: 11/21/1966    Quit date: 08/13/1986  . Smokeless tobacco: Never Used  . Alcohol Use: No   OB History    Gravida Para Term Preterm AB TAB SAB Ectopic Multiple Living   _0 Review of Systems   Constitutional: Negative for fever and chills.  Musculoskeletal: Positive for arthralgias (right great toe pain and itching). Negative for joint swelling.  Skin: Negative for color change and wound.  Neurological: Negative for weakness and numbness.  All other systems reviewed and are negative.     Allergies  Latex  Home Medications   Prior to Admission medications   Medication Sig Start Date End Date Taking? Authorizing Provider  ACCU-CHEK AVIVA PLUS test strip USE TO TEST TWICE DAILY. 04/15/16   Elayne Snare, MD  ALPRAZolam Duanne Moron) 0.5 MG tablet Take by mouth at bedtime. Takes half a tab at bedtime 01/19/16   Historical Provider, MD  amLODipine (NORVASC) 10 MG tablet TAKE (1) TABLET BY MOUTH ONCE DAILY. 10/19/15   Herminio Commons, MD  B-D ULTRAFINE III SHORT PEN 31G X 8 MM MISC USE AS DIRECTED 2-3 TIMES A DAY. 03/14/16   Elayne Snare, MD  Blood Glucose Monitoring Suppl (ACCU-CHEK AVIVA PLUS) W/DEVICE KIT Use to check blood sugar 2 times per day dx code E11.65 10/09/14   Elayne Snare, MD  ciprofloxacin (CIPRO) 500 MG tablet Take 1 tablet (500 mg total) by mouth 2 (two) times daily. 02/23/16   Eduard Clos  Laurann Montana, NP  diphenhydrAMINE (BENADRYL) 25 MG tablet Take 1 tablet (25 mg total) by mouth every 4 (four) hours as needed for itching. 05/28/16   Lessie Funderburke, PA-C  HUMALOG KWIKPEN 100 UNIT/ML KiwkPen INJECT 6 TO 7 UNITS SUBCUTANEOUSLY BEFORE SUPPER IF GLUCOSE IS HIGH. 04/15/16   Elayne Snare, MD  HYDROcodone-acetaminophen (NORCO/VICODIN) 5-325 MG tablet Take one-two tabs po q 4-6 hrs prn pain 05/28/16   Anais Denslow, PA-C  ibuprofen (ADVIL,MOTRIN) 200 MG tablet Take 400 mg by mouth as needed.    Historical Provider, MD  Lancets Glory Rosebush ULTRASOFT) lancets  04/29/13   Historical Provider, MD  LANTUS SOLOSTAR 100 UNIT/ML Solostar Pen INJECT [place]. 03/14/16   Elayne Snare, MD  metFORMIN (GLUCOPHAGE) 850 MG tablet TAKE (1) TABLET BY MOUTH TWICE DAILY. 03/28/16    Philemon Kingdom, MD  metoprolol (LOPRESSOR) 50 MG tablet TAKE 0.5 (ONE-HALF) TABLET BY MOUTH TWICE DAILY. 12/18/15   Herminio Commons, MD  Multiple Vitamin (MULTIVITAMIN) tablet Take 1 tablet by mouth daily.    Historical Provider, MD  omeprazole (PRILOSEC) 20 MG capsule Take 20 mg by mouth daily.      Historical Provider, MD  polyethylene glycol powder (GLYCOLAX/MIRALAX) powder Take 1 Container by mouth as needed for moderate constipation.  09/23/13   Historical Provider, MD  potassium chloride (K-DUR) 10 MEQ tablet Take 10 mEq by mouth daily.  02/20/15   Historical Provider, MD  pravastatin (PRAVACHOL) 20 MG tablet TAKE ONE TABLET BY MOUTH DAILY. 03/14/16   Elayne Snare, MD  sulfamethoxazole-trimethoprim (BACTRIM DS,SEPTRA DS) 800-160 MG tablet Take 1 tablet by mouth 2 (two) times daily. 02/25/16   Estill Dooms, NP  torsemide (DEMADEX) 20 MG tablet TAKE 1 TABLET BY MOUTH ONCE DAILY AS NEEDED. 02/17/16   Herminio Commons, MD  traMADol (ULTRAM) 50 MG tablet Take 1 tablet (50 mg total) by mouth every 6 (six) hours as needed. 05/09/15   Orpah Greek, MD  valsartan (DIOVAN) 320 MG tablet Take 320 mg by mouth daily.      Historical Provider, MD  VICTOZA 18 MG/3ML SOPN INJECT 1.8 MG SUBCUTANEOUSLY ONCE A DAY AS DIRECTED. 05/17/16   Elayne Snare, MD   BP 141/64 mmHg  Pulse 74  Temp(Src) 97.8 F (36.6 C) (Oral)  Resp 16  Ht _0  (1.702 m)  Wt 104.327 kg  BMI 36.01 kg/m2  SpO2 100% Physical Exam  Constitutional: She is oriented to person, place, and time. She appears well-developed and well-nourished. No distress.  HENT:  Head: Normocephalic and atraumatic.  Cardiovascular: Normal rate, regular rhythm and intact distal pulses.   Pulmonary/Chest: Effort normal and breath sounds normal. No respiratory distress.  Musculoskeletal: She exhibits tenderness. She exhibits no edema.  Localized tenderness to light touch on medial aspect of the right great toe.  DP pulse is brisk,distal sensation  intact.  No erythema, edema, bruising or bony deformity.  No proximal tenderness.  Neurological: She is alert and oriented to person, place, and time. She exhibits normal muscle tone. Coordination normal.  Skin: Skin is warm and dry.  Nursing note and vitals reviewed.   ED Course  Procedures (including critical care time) Labs Review Labs Reviewed  URIC ACID    Imaging Review Dg Toe Great Right  05/28/2016  CLINICAL DATA:  RIGHT great toe pain, itching, burning, and patch of redness, has been wearing a new pair of shoes for the past week EXAM: RIGHT GREAT TOE COMPARISON:  None FINDINGS: Osseous mineralization  normal. Joint spaces preserved. No acute fracture, dislocation or bone destruction. IMPRESSION: No acute osseous abnormalities. Electronically Signed   By: Lavonia Dana M.D.   On: 05/28/2016 14:51    I have personally reviewed and evaluated these images and lab results as part of my medical decision-making.   EKG Interpretation None      MDM   Final diagnoses:  Toe pain, right   Labs and XR are reassuring.   Pt with itching and burning to the right great toe.  NV intact.  No concerning sx's for cellulitis, no open wounds or rash.  Sx's may be related to wearing new shoes.  Pt has podiatrist, agrees to symptomatic tx and podiatry f/u if needed.    Kem Parkinson, PA-C 05/31/16 2222  Nat Christen, MD 06/01/16 1754

## 2016-06-08 DIAGNOSIS — G5761 Lesion of plantar nerve, right lower limb: Secondary | ICD-10-CM | POA: Diagnosis not present

## 2016-06-08 DIAGNOSIS — M7751 Other enthesopathy of right foot: Secondary | ICD-10-CM | POA: Diagnosis not present

## 2016-06-15 ENCOUNTER — Other Ambulatory Visit: Payer: Self-pay | Admitting: Endocrinology

## 2016-06-21 DIAGNOSIS — E1122 Type 2 diabetes mellitus with diabetic chronic kidney disease: Secondary | ICD-10-CM | POA: Diagnosis not present

## 2016-06-21 DIAGNOSIS — I10 Essential (primary) hypertension: Secondary | ICD-10-CM | POA: Diagnosis not present

## 2016-06-29 ENCOUNTER — Other Ambulatory Visit (HOSPITAL_COMMUNITY): Payer: Self-pay | Admitting: Family Medicine

## 2016-06-29 DIAGNOSIS — Z1231 Encounter for screening mammogram for malignant neoplasm of breast: Secondary | ICD-10-CM

## 2016-06-29 DIAGNOSIS — M7751 Other enthesopathy of right foot: Secondary | ICD-10-CM | POA: Diagnosis not present

## 2016-06-29 DIAGNOSIS — M722 Plantar fascial fibromatosis: Secondary | ICD-10-CM | POA: Diagnosis not present

## 2016-06-29 DIAGNOSIS — M7752 Other enthesopathy of left foot: Secondary | ICD-10-CM | POA: Diagnosis not present

## 2016-06-29 DIAGNOSIS — M7989 Other specified soft tissue disorders: Secondary | ICD-10-CM | POA: Diagnosis not present

## 2016-06-30 ENCOUNTER — Encounter (INDEPENDENT_AMBULATORY_CARE_PROVIDER_SITE_OTHER): Payer: Self-pay

## 2016-06-30 ENCOUNTER — Ambulatory Visit (INDEPENDENT_AMBULATORY_CARE_PROVIDER_SITE_OTHER): Payer: Medicare Other | Admitting: Cardiovascular Disease

## 2016-06-30 VITALS — BP 138/70 | HR 70 | Ht 67.0 in | Wt 237.0 lb

## 2016-06-30 DIAGNOSIS — I480 Paroxysmal atrial fibrillation: Secondary | ICD-10-CM

## 2016-06-30 DIAGNOSIS — I10 Essential (primary) hypertension: Secondary | ICD-10-CM

## 2016-06-30 DIAGNOSIS — E049 Nontoxic goiter, unspecified: Secondary | ICD-10-CM | POA: Diagnosis not present

## 2016-06-30 DIAGNOSIS — R002 Palpitations: Secondary | ICD-10-CM

## 2016-06-30 DIAGNOSIS — F419 Anxiety disorder, unspecified: Secondary | ICD-10-CM

## 2016-06-30 DIAGNOSIS — E785 Hyperlipidemia, unspecified: Secondary | ICD-10-CM

## 2016-06-30 NOTE — Progress Notes (Signed)
SUBJECTIVE: The patient returns for follow-up after undergoing cardiovascular testing performed for the evaluation of palps/PAF.  Thyroid testing was normal.  Event monitoring showed NSR. No arrhythmias.  Echo 05/05/16: Normal cardiac function, EF 60-65%.  Admits to a h/o anxiety.   Review of Systems: As per "subjective", otherwise negative.  Allergies  Allergen Reactions  . Latex     Itching and breaking out    Current Outpatient Prescriptions  Medication Sig Dispense Refill  . ACCU-CHEK AVIVA PLUS test strip USE TO TEST TWICE DAILY. 100 each 3  . ALPRAZolam (XANAX) 0.5 MG tablet Take by mouth at bedtime. Takes half a tab at bedtime    . amLODipine (NORVASC) 10 MG tablet TAKE (1) TABLET BY MOUTH ONCE DAILY. 30 tablet 11  . B-D ULTRAFINE III SHORT PEN 31G X 8 MM MISC USE AS DIRECTED 2-3 TIMES A DAY. 100 each 11  . Blood Glucose Monitoring Suppl (ACCU-CHEK AVIVA PLUS) W/DEVICE KIT Use to check blood sugar 2 times per day dx code E11.65 1 kit 0  . diphenhydrAMINE (BENADRYL) 25 MG tablet Take 1 tablet (25 mg total) by mouth every 4 (four) hours as needed for itching. 20 tablet 0  . gabapentin (NEURONTIN) 100 MG capsule Take 300 mg by mouth at bedtime.     Marland Kitchen HUMALOG KWIKPEN 100 UNIT/ML KiwkPen INJECT 6 TO 7 UNITS SUBCUTANEOUSLY BEFORE SUPPER IF GLUCOSE IS HIGH. 15 mL 2  . HYDROcodone-acetaminophen (NORCO/VICODIN) 5-325 MG tablet Take one-two tabs po q 4-6 hrs prn pain 12 tablet 0  . ibuprofen (ADVIL,MOTRIN) 200 MG tablet Take 400 mg by mouth as needed.    . Lancets (ONETOUCH ULTRASOFT) lancets     . LANTUS SOLOSTAR 100 UNIT/ML Solostar Pen INJECT 44 UNITS SUBCUTANEOUSLY ONCE EVERY MORNING. 15 mL 5  . metFORMIN (GLUCOPHAGE) 850 MG tablet TAKE (1) TABLET BY MOUTH TWICE DAILY. 60 tablet 2  . metoprolol (LOPRESSOR) 50 MG tablet TAKE 0.5 (ONE-HALF) TABLET BY MOUTH TWICE DAILY. 30 tablet 11  . Multiple Vitamin (MULTIVITAMIN) tablet Take 1 tablet by mouth daily.    Marland Kitchen omeprazole  (PRILOSEC) 20 MG capsule Take 20 mg by mouth daily.      . polyethylene glycol powder (GLYCOLAX/MIRALAX) powder Take 1 Container by mouth as needed for moderate constipation.     . potassium chloride (K-DUR) 10 MEQ tablet Take 10 mEq by mouth daily.     . pravastatin (PRAVACHOL) 20 MG tablet TAKE ONE TABLET BY MOUTH DAILY. 30 tablet 5  . sulfamethoxazole-trimethoprim (BACTRIM DS,SEPTRA DS) 800-160 MG tablet Take 1 tablet by mouth 2 (two) times daily. 14 tablet 0  . torsemide (DEMADEX) 20 MG tablet TAKE 1 TABLET BY MOUTH ONCE DAILY AS NEEDED. 30 tablet 6  . traMADol (ULTRAM) 50 MG tablet Take 1 tablet (50 mg total) by mouth every 6 (six) hours as needed. 15 tablet 0  . valsartan (DIOVAN) 320 MG tablet Take 320 mg by mouth daily.      Marland Kitchen VICTOZA 18 MG/3ML SOPN INJECT 1.8 MG SUBCUTANEOUSLY ONCE A DAY AS DIRECTED. 9 mL 3   No current facility-administered medications for this visit.     Past Medical History:  Diagnosis Date  . Atrial fibrillation (HCC)    Paroxysmal; LVH; nl EF; onset in 1999  . Burning with urination 02/23/2016  . Diabetes mellitus    insulin; managed by Dr. Dwyane Dee  . 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 Hyperlipidemia   .  Hypertension   . Hypothyroidism    history of goiter; nl TSH off medication  . Itching with irritation 02/27/2015  . Obesity 07/06/2012  . Palpitations    negative event recorder in 2009  . UTI (urinary tract infection) 02/23/2016    Past Surgical History:  Procedure Laterality Date  . BIOPSY THYROID    . CESAREAN SECTION    . CHOLECYSTECTOMY  11/95  . COLONOSCOPY  2007  . RETINAL DETACHMENT SURGERY  2009  . TUBAL LIGATION  1980's    Social History   Social History  . Marital status: Married    Spouse name: N/A  . Number of children: N/A  . Years of education: N/A   Occupational History  . Retired    Social History Main Topics  . Smoking status: Former Smoker    Packs/day: 1.00    Types: Cigarettes    Start  date: 11/21/1966    Quit date: 08/13/1986  . Smokeless tobacco: Never Used  . Alcohol use No  . Drug use: No  . Sexual activity: No   Other Topics Concern  . Not on file   Social History Narrative   No regular exercise     Vitals:   06/30/16 0846  BP: 138/70  Pulse: 70  SpO2: 99%  Weight: 237 lb (107.5 kg)  Height: 5' 7"  (1.702 m)    PHYSICAL EXAM General: NAD HEENT: Normal. Neck: No JVD, + thyromegaly. Lungs: Clear to auscultation bilaterally with normal respiratory effort. CV: Nondisplaced PMI.  Regular rate and rhythm, normal S1/S2, no S3/S4, no murmur. Trace pretibial edema.     Abdomen: Soft, nontender, obese.  Neurologic: Alert and oriented.  Psych: Normal affect. Skin: Normal. Musculoskeletal: No gross deformities.    ECG: Most recent ECG reviewed.      ASSESSMENT AND PLAN: 1. Palpitations with a h/o paroxysmal atrial fibrillation: No arrhythmias by event monitoring. Normal cardiac function by echo. She has a very remote history, and thus has never been on anticoagulation.  Continue metoprolol. If she were to have documented recurrences, given her history of HTN and diabetes, she would warrant anticoagulation.  2. Essential HTN: Controlled. No changes.  3. Hyperlipidemia: Lipid panel on 03/07/2016 demonstrated total cholesterol 171, triglycerides 126, HDL 60, LDL 86. On pravastatin 40 mg.  4. Multinodular goiter: Normal thyroid function.  Dispo: f/u 2 years.  Kate Sable, M.D., F.A.C.C.

## 2016-06-30 NOTE — Patient Instructions (Signed)
Your physician wants you to follow-up in: 2 years Dr Virgina Jock will receive a reminder letter in the mail two months in advance. If you don't receive a letter, please call our office to schedule the follow-up appointment.   Your physician recommends that you continue on your current medications as directed. Please refer to the Current Medication list given to you today.    If you need a refill on your cardiac medications before your next appointment, please call your pharmacy.    Thank you for choosing [facility] !

## 2016-07-08 ENCOUNTER — Ambulatory Visit (INDEPENDENT_AMBULATORY_CARE_PROVIDER_SITE_OTHER): Payer: Medicare Other | Admitting: Endocrinology

## 2016-07-08 VITALS — BP 118/60 | HR 67 | Ht 67.0 in | Wt 234.0 lb

## 2016-07-08 DIAGNOSIS — E0590 Thyrotoxicosis, unspecified without thyrotoxic crisis or storm: Secondary | ICD-10-CM

## 2016-07-08 DIAGNOSIS — E1165 Type 2 diabetes mellitus with hyperglycemia: Secondary | ICD-10-CM

## 2016-07-08 DIAGNOSIS — Z794 Long term (current) use of insulin: Secondary | ICD-10-CM | POA: Diagnosis not present

## 2016-07-08 DIAGNOSIS — E785 Hyperlipidemia, unspecified: Secondary | ICD-10-CM

## 2016-07-08 LAB — POCT GLYCOSYLATED HEMOGLOBIN (HGB A1C): HEMOGLOBIN A1C: 8

## 2016-07-08 NOTE — Patient Instructions (Addendum)
Check blood sugars on waking up  3x weekly  Also check blood sugars about 2 hours after a meal and do this after different meals by rotation  Recommended blood sugar levels on waking up is 90-130 and about 2 hours after meal is 130-160  Please bring your blood sugar monitor to each visit, thank you  LANTUS 40 UNITS  VICTOZA 0.6mg  for 3 days, 1.2 for 3 days then days  Walk daily

## 2016-07-08 NOTE — Progress Notes (Signed)
Patient ID: Brittney [name], female   DOB: 11/13/1946, 70 y.o.   MRN: [id number]   Reason for Appointment: Followup of diabetes  History of Present Illness    Type 2 DIABETES MELITUS, date of diagnosis: 1976      She has had long-standing diabetes taking insulin. Because of her difficulties with compliance using mealtime insulin she was given in [place] in 2011 and subsequently Victoza in addition to her Lantus and this helped her control However she tends to be erratic with her day-to-day care and has not had adequate A1c levels in the past, usually over 7%  RECENT history:   Insulin regimen: Lantus 44 units daily in a.m., NovoLog 6 units at times before mostly before supper  Her A1c has been consistently around 8% except in 11/16 when it was improved She has not been seen in follow-up since then  Current glucose patterns and problems:  She has checked blood sugars mostly in the morning despite reminders to check readings after meals  Despite reminding her to take insulin with all meals that have significant carbohydrate or having larger meal size she still does not do so and now is randomly taking it only at suppertime.  With sometimes eating cereal in the morning her blood sugars tend to be higher especially when she has her labs drawn here  Her A1c indicates high postprandial readings since her FASTING blood sugars are fairly consistently your normal  She was told to reduce her Lantus by 2 units previously but she has not done so  She has not been able to do any consistent walking, recently having some foot pain  Her weight is somewhat higher  She is not taking Victoza because she thought it was making her neck swell for the last 3 weeks    Hypoglycemia:  Once at 3:30 AM and another time at 8 AM  Oral hypoglycemic drugs: Metformin        Side effects from medications:  Invokana:? Headache      Monitors blood glucose: 3 a day.    Glucometer: Accucheck         Blood  Glucose readings from meter download:  Mean values apply above for all meters except median for One Touch  PRE-MEAL Fasting Lunch Dinner Overnight  Overall  Glucose range: 68-223  70-136  77-132  66, 145    Mean/median: 127     123      Meals: 2-3 meals per day. . Dinner 7-8 pm, variable quantity Does have protein with breakfast consistently, has egg;           Physical activity: exercise: not walking           Wt Readings from Last 3 Encounters:  07/08/16 234 lb (106.1 kg)  06/30/16 237 lb (107.5 kg)  05/28/16 230 lb (104.3 kg)   GLYCEMIC CONTROL:  Lab Results  Component Value Date   HGBA1C 8.0 07/08/2016   HGBA1C 8.1 03/07/2016   HGBA1C 7.6 10/07/2015   Lab Results  Component Value Date   MICROALBUR 0.7 10/07/2015   LDLCALC 86 03/07/2016   CREATININE 1.01 05/09/2016           Medication List       Accurate as of 07/08/16 12:38 PM. Always use your most recent med list.          ACCU-CHEK AVIVA PLUS test strip Generic drug:  glucose blood USE TO TEST TWICE DAILY.   ACCU-CHEK AVIVA PLUS w/Device Kit  Use to check blood sugar 2 times per day dx code E11.65   ALPRAZolam 0.5 MG tablet Commonly known as:  XANAX Take by mouth at bedtime. Takes half a tab at bedtime   amLODipine 10 MG tablet Commonly known as:  NORVASC TAKE (1) TABLET BY MOUTH ONCE DAILY.   B-D ULTRAFINE III SHORT PEN 31G X 8 MM Misc Generic drug:  Insulin Pen Needle USE AS DIRECTED 2-3 TIMES A DAY.   diphenhydrAMINE 25 MG tablet Commonly known as:  BENADRYL Take 1 tablet (25 mg total) by mouth every 4 (four) hours as needed for itching.   gabapentin 100 MG capsule Commonly known as:  NEURONTIN Take 300 mg by mouth at bedtime.   HUMALOG KWIKPEN 100 UNIT/ML KiwkPen Generic drug:  insulin lispro INJECT 6 TO 7 UNITS SUBCUTANEOUSLY BEFORE SUPPER IF GLUCOSE IS HIGH.   HYDROcodone-acetaminophen 5-325 MG tablet Commonly known as:  NORCO/VICODIN Take one-two tabs po q 4-6 hrs prn pain     ibuprofen 200 MG tablet Commonly known as:  ADVIL,MOTRIN Take 400 mg by mouth as needed.   LANTUS SOLOSTAR 100 UNIT/ML Solostar Pen Generic drug:  Insulin Glargine INJECT 44 UNITS SUBCUTANEOUSLY ONCE EVERY MORNING.   metFORMIN 850 MG tablet Commonly known as:  GLUCOPHAGE TAKE (1) TABLET BY MOUTH TWICE DAILY.   metoprolol 50 MG tablet Commonly known as:  LOPRESSOR TAKE 0.5 (ONE-HALF) TABLET BY MOUTH TWICE DAILY.   multivitamin tablet Take 1 tablet by mouth daily.   omeprazole 20 MG capsule Commonly known as:  PRILOSEC Take 20 mg by mouth daily.   onetouch ultrasoft lancets   polyethylene glycol powder powder Commonly known as:  GLYCOLAX/MIRALAX Take 1 Container by mouth as needed for moderate constipation.   potassium chloride 10 MEQ tablet Commonly known as:  K-DUR Take 10 mEq by mouth daily.   pravastatin 20 MG tablet Commonly known as:  PRAVACHOL TAKE ONE TABLET BY MOUTH DAILY.   sulfamethoxazole-trimethoprim 800-160 MG tablet Commonly known as:  BACTRIM DS,SEPTRA DS Take 1 tablet by mouth 2 (two) times daily.   torsemide 20 MG tablet Commonly known as:  DEMADEX TAKE 1 TABLET BY MOUTH ONCE DAILY AS NEEDED.   traMADol 50 MG tablet Commonly known as:  ULTRAM Take 1 tablet (50 mg total) by mouth every 6 (six) hours as needed.   valsartan 320 MG tablet Commonly known as:  DIOVAN Take 320 mg by mouth daily.   VICTOZA 18 MG/3ML Sopn Generic drug:  Liraglutide INJECT 1.8 MG SUBCUTANEOUSLY ONCE A DAY AS DIRECTED.       Allergies:  Allergies  Allergen Reactions  . Latex     Itching and breaking out    Past Medical History:  Diagnosis Date  . Atrial fibrillation (HCC)    Paroxysmal; LVH; nl EF; onset in 1999  . Burning with urination 02/23/2016  . Diabetes mellitus    insulin; managed by Dr. Dwyane Dee  . 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 Hyperlipidemia   . Hypertension   . Hypothyroidism    history of goiter; nl TSH  off medication  . Itching with irritation 02/27/2015  . Obesity 07/06/2012  . Palpitations    negative event recorder in 2009  . UTI (urinary tract infection) 02/23/2016    Past Surgical History:  Procedure Laterality Date  . BIOPSY THYROID    . CESAREAN SECTION    . CHOLECYSTECTOMY  11/95  . COLONOSCOPY  2007  . RETINAL DETACHMENT SURGERY  2009  . TUBAL LIGATION  1980's    Family History  Problem Relation Age of Onset  . Diabetes Mother   . Diabetes Daughter     gestational diabetes  . Cancer Father   . Thyroid disease Brother   . Other Son     MVA    Social History:  reports that she quit smoking about 29 years ago. Her smoking use included Cigarettes. She started smoking about 49 years ago. She smoked 1.00 pack per day. She has never used smokeless tobacco. She reports that she does not drink alcohol or use drugs.  Review of Systems:  Foot pains, better now, She had an injection by a podiatrist and she was told to have neuropathy and given gabapentin  HYPERTENSION:  this is well controlled with current regimen, followed by PCP  HYPERLIPIDEMIA: The lipid abnormality consists of elevated LDL; she has been taking her Pravachol irregularly   Lab Results  Component Value Date   CHOL 171 03/07/2016   HDL 60.40 03/07/2016   LDLCALC 86 03/07/2016   TRIG 126.0 03/07/2016   CHOLHDL 3 03/07/2016    Multinodular goiter: She has had a long-standing multinodular goiter since at least 2005 which has been fairly stable clinically She does have history of occasional palpitations for years but has been treated with metoprolol by her cardiologist for this Her goiter has been autonomous. No local pressure symptoms of choking or difficulty swallowing She thinks her neck is somewhat more swollen recently  No evidence of overt hyperthyroidism  with normal free T4 and free T3 levels; TSH has been consistently suppressed  Her I-131 uptake in 2003 was 17% and her  thyroid ultrasound in  2014 did not show distinct nodules but a relative increase in size  Lab Results  Component Value Date   TSH <0.01 (L) 04/28/2016   TSH 0.32 (L) 03/07/2016   FREET4 1.02 05/09/2016   FREET4 1.4 04/28/2016       Examination:   BP 118/60   Pulse 67   Ht 5' 7"  (1.702 m)   Wt 234 lb (106.1 kg)   SpO2 97%   BMI 36.65 kg/m   Body mass index is 36.65 kg/m.   The neck conference is about 42 cm.  She has significant thyromegaly especially on the right, multinodular Reflexes normal  ASSESSMENT/ PLAN:  Diabetes Type 2   See history of present illness for detailed discussion of  current management, blood sugar patterns and problems identified Patient continues to have consistent problems with postprandial hyperglycemia and A1c of around 8% Also recently has not taken Victoza since she mistakenly thought it was causing swelling in her neck Although she requires small amounts of mealtime insulin she does not take it usually unless eating a lot of starchy foods at dinnertime.  Otherwise she does have carbohydrates at breakfast or lunch periodically Discussed with her that her A1c indicates consistent postprandial hyperglycemia and she does need to monitor more readings after meals to help understand when the blood sugar goes up and adjust her insulin   NEUROPATHY: She is subjectively doing better now  Hypercholesterolemia: Lipids well controlled as of April  Hypertension:   controlled   MULTINODULAR goiter: Need to reassess her thyroid levels periodically and if free T4 or free T3 are high normal consider thyroid scan and I-131 treatment   Patient Instructions  Check blood sugars on waking up  3x weekly  Also check blood sugars about 2 hours after a meal and do  this after different meals by rotation  Recommended blood sugar levels on waking up is 90-130 and about 2 hours after meal is 130-160  Please bring your blood sugar monitor to each visit, thank you  LANTUS 40  UNITS  VICTOZA 0.43m for 3 days, 1.2 for 3 days then days  Walk daily     Counseling time on subjects discussed above is over 50% of today's 25 minute visit   Linn Goetze 07/08/2016, 12:38 PM

## 2016-07-21 DIAGNOSIS — M722 Plantar fascial fibromatosis: Secondary | ICD-10-CM | POA: Diagnosis not present

## 2016-07-21 DIAGNOSIS — M792 Neuralgia and neuritis, unspecified: Secondary | ICD-10-CM | POA: Diagnosis not present

## 2016-08-01 ENCOUNTER — Ambulatory Visit (HOSPITAL_COMMUNITY): Payer: Medicare Other

## 2016-08-03 ENCOUNTER — Encounter (INDEPENDENT_AMBULATORY_CARE_PROVIDER_SITE_OTHER): Payer: Medicare Other | Admitting: Ophthalmology

## 2016-08-10 ENCOUNTER — Encounter (INDEPENDENT_AMBULATORY_CARE_PROVIDER_SITE_OTHER): Payer: Medicare Other | Admitting: Ophthalmology

## 2016-08-10 DIAGNOSIS — E11319 Type 2 diabetes mellitus with unspecified diabetic retinopathy without macular edema: Secondary | ICD-10-CM | POA: Diagnosis not present

## 2016-08-10 DIAGNOSIS — H35033 Hypertensive retinopathy, bilateral: Secondary | ICD-10-CM

## 2016-08-10 DIAGNOSIS — I10 Essential (primary) hypertension: Secondary | ICD-10-CM | POA: Diagnosis not present

## 2016-08-10 DIAGNOSIS — H43813 Vitreous degeneration, bilateral: Secondary | ICD-10-CM | POA: Diagnosis not present

## 2016-08-10 DIAGNOSIS — E113593 Type 2 diabetes mellitus with proliferative diabetic retinopathy without macular edema, bilateral: Secondary | ICD-10-CM

## 2016-08-10 DIAGNOSIS — H35372 Puckering of macula, left eye: Secondary | ICD-10-CM

## 2016-08-17 ENCOUNTER — Ambulatory Visit (HOSPITAL_COMMUNITY)
Admission: RE | Admit: 2016-08-17 | Discharge: 2016-08-17 | Disposition: A | Discharge status: Home / Self Care | Payer: Medicare Other | Source: Ambulatory Visit | Attending: Family Medicine | Admitting: Family Medicine

## 2016-08-17 DIAGNOSIS — Z1231 Encounter for screening mammogram for malignant neoplasm of breast: Secondary | ICD-10-CM | POA: Diagnosis not present

## 2016-08-31 ENCOUNTER — Ambulatory Visit (INDEPENDENT_AMBULATORY_CARE_PROVIDER_SITE_OTHER): Payer: Medicare Other | Admitting: *Deleted

## 2016-08-31 DIAGNOSIS — Z23 Encounter for immunization: Secondary | ICD-10-CM

## 2016-09-14 ENCOUNTER — Other Ambulatory Visit: Payer: Self-pay | Admitting: Endocrinology

## 2016-09-14 ENCOUNTER — Other Ambulatory Visit: Payer: Self-pay | Admitting: Cardiovascular Disease

## 2016-10-07 ENCOUNTER — Ambulatory Visit: Payer: Medicare Other | Admitting: Endocrinology

## 2016-10-10 DIAGNOSIS — I10 Essential (primary) hypertension: Secondary | ICD-10-CM | POA: Diagnosis not present

## 2016-10-10 DIAGNOSIS — E1122 Type 2 diabetes mellitus with diabetic chronic kidney disease: Secondary | ICD-10-CM | POA: Diagnosis not present

## 2016-10-11 ENCOUNTER — Encounter: Payer: Self-pay | Admitting: Endocrinology

## 2016-10-11 ENCOUNTER — Ambulatory Visit (INDEPENDENT_AMBULATORY_CARE_PROVIDER_SITE_OTHER): Payer: Medicare Other | Admitting: Endocrinology

## 2016-10-11 VITALS — BP 134/68 | HR 78 | Temp 98.2°F | Resp 16 | Ht 67.0 in | Wt 231.1 lb

## 2016-10-11 DIAGNOSIS — E1165 Type 2 diabetes mellitus with hyperglycemia: Secondary | ICD-10-CM | POA: Diagnosis not present

## 2016-10-11 DIAGNOSIS — E0590 Thyrotoxicosis, unspecified without thyrotoxic crisis or storm: Secondary | ICD-10-CM | POA: Diagnosis not present

## 2016-10-11 DIAGNOSIS — Z794 Long term (current) use of insulin: Secondary | ICD-10-CM

## 2016-10-11 DIAGNOSIS — E7800 Pure hypercholesterolemia, unspecified: Secondary | ICD-10-CM

## 2016-10-11 DIAGNOSIS — E1100 Type 2 diabetes mellitus with hyperosmolarity without nonketotic hyperglycemic-hyperosmolar coma (NKHHC): Secondary | ICD-10-CM | POA: Diagnosis not present

## 2016-10-11 LAB — LIPID PANEL
CHOL/HDL RATIO: 3
Cholesterol: 168 mg/dL (ref 0–200)
HDL: 61.8 mg/dL (ref >39.00)
LDL Cholesterol: 84 mg/dL (ref 0–99)
NonHDL: 106.34
TRIGLYCERIDES: 114 mg/dL (ref 0.0–149.0)
VLDL: 22.8 mg/dL (ref 0.0–40.0)

## 2016-10-11 LAB — COMPREHENSIVE METABOLIC PANEL
ALK PHOS: 99 U/L (ref 39–117)
ALT: 9 U/L (ref 0–35)
AST: 10 U/L (ref 0–37)
Albumin: 3.8 g/dL (ref 3.5–5.2)
BILIRUBIN TOTAL: 0.3 mg/dL (ref 0.2–1.2)
BUN: 19 mg/dL (ref 6–23)
CALCIUM: 9.4 mg/dL (ref 8.4–10.5)
CO2: 28 meq/L (ref 19–32)
Chloride: 105 mEq/L (ref 96–112)
Creatinine, Ser: 0.92 mg/dL (ref 0.40–1.20)
GFR: 77.74 mL/min (ref >60.00)
Glucose, Bld: 196 mg/dL — ABNORMAL HIGH (ref 70–99)
Potassium: 4.9 mEq/L (ref 3.5–5.1)
Sodium: 139 mEq/L (ref 135–145)
Total Protein: 7.2 g/dL (ref 6.0–8.3)

## 2016-10-11 LAB — TSH: TSH: 0.12 u[IU]/mL — ABNORMAL LOW (ref 0.35–4.50)

## 2016-10-11 LAB — T3, FREE: T3, Free: 3.3 pg/mL (ref 2.3–4.2)

## 2016-10-11 LAB — POCT GLYCOSYLATED HEMOGLOBIN (HGB A1C): Hemoglobin A1C: 7.8

## 2016-10-11 LAB — T4, FREE: Free T4: 1.01 ng/dL (ref 0.60–1.60)

## 2016-10-11 NOTE — Progress Notes (Signed)
Please let patient know that the thyroid levels are not indicating overactive thyroid, blood sugar was 196 otherwise all labs okay

## 2016-10-11 NOTE — Addendum Note (Signed)
Addended by: Kaylyn Lim I on: 10/11/2016 01:33 PM   Modules accepted: Orders

## 2016-10-11 NOTE — Progress Notes (Signed)
Patient ID: Brittney [name], female   DOB: [birth year], 70 y.o.   MRN: [id number]   Reason for Appointment: Followup of diabetes  History of Present Illness    Type 2 DIABETES MELITUS, date of diagnosis: 1976      She has had long-standing diabetes taking insulin. Because of her difficulties with compliance using mealtime insulin she was given in [place] in 2011 and subsequently Victoza in addition to her Lantus and this helped her control However she tends to be erratic with her day-to-day care and has not had adequate A1c levels in the past, usually over 7%  RECENT history:   Insulin regimen: Lantus 44 units daily in a.m., NovoLog 6 units at times before mostly before supper  Her A1c has been consistently around 8% , only minimally improved on this visit at 7.8 On her last visit she was told to restart Victoza 1.2 mg daily and she is tolerating this well  Current glucose patterns and problems:  She has checked blood sugars mostly in the morning again despite reminders to check readings after meals  She still does not understand the need to take insulin before a meal that has carbohydrates or will likely make her sugar go up  She has only a few readings in the evenings but usually not 2 hours after eating  Has only occasional readings during the night which appear to be high mostly  FASTING readings are quite variable and she does not know why they do fluctuate  She thinks she takes her Lantus consistently  Takes Novolog only sporadically at suppertime and not clear when she decides to take this  She has started doing a little walking with less foot pain  No hypoglycemia  Her weight is somewhat better    Hypoglycemia:  None recently  Oral hypoglycemic drugs: Metformin        Side effects from medications:  Invokana:? Headache      Monitors blood glucose: 3 a day.    Glucometer: Accucheck         Blood Glucose readings from meter download:  Mean values apply  above for all meters except median for One Touch  PRE-MEAL Fasting Lunch Dinner Overnight  Overall  Glucose range: 90-182   103-226  167-263    Mean/median: 140    148   POST-MEAL PC Breakfast PC Lunch PC Dinner  Glucose range:   164   Mean/median:        Meals: 2-3 meals per day. . Dinner 7-8 pm, variable quantity Does have protein with breakfast consistently, has egg;           Physical activity: exercise: walking           Wt Readings from Last 3 Encounters:  10/11/16 231 lb 2 oz (104.8 kg)  07/08/16 234 lb (106.1 kg)  06/30/16 237 lb (107.5 kg)   GLYCEMIC CONTROL:  Lab Results  Component Value Date   HGBA1C 7.8 10/11/2016   HGBA1C 8.0 07/08/2016   HGBA1C 8.1 03/07/2016   Lab Results  Component Value Date   MICROALBUR 0.7 10/07/2015   LDLCALC 86 03/07/2016   CREATININE 1.01 05/09/2016      Other problems discussed today: See review of systems     Medication List       Accurate as of 10/11/16 12:02 PM. Always use your most recent med list.          ACCU-CHEK AVIVA PLUS test strip Generic drug:  glucose blood  USE TO TEST TWICE DAILY.   ACCU-CHEK AVIVA PLUS w/Device Kit Use to check blood sugar 2 times per day dx code E11.65   ALPRAZolam 0.5 MG tablet Commonly known as:  XANAX Take 0.5 mg by mouth at bedtime. Takes half a tab at bedtime   amLODipine 10 MG tablet Commonly known as:  NORVASC TAKE (1) TABLET BY MOUTH ONCE DAILY.   B-D ULTRAFINE III SHORT PEN 31G X 8 MM Misc Generic drug:  Insulin Pen Needle USE AS DIRECTED 2-3 TIMES A DAY.   gabapentin 100 MG capsule Commonly known as:  NEURONTIN Take 300 mg by mouth at bedtime.   HUMALOG KWIKPEN 100 UNIT/ML KiwkPen Generic drug:  insulin lispro INJECT 6 TO 7 UNITS SUBCUTANEOUSLY BEFORE SUPPER IF GLUCOSE IS HIGH.   ibuprofen 200 MG tablet Commonly known as:  ADVIL,MOTRIN Take 400 mg by mouth as needed.   LANTUS SOLOSTAR 100 UNIT/ML Solostar Pen Generic drug:  Insulin Glargine INJECT 44  UNITS SUBCUTANEOUSLY ONCE EVERY MORNING.   metFORMIN 850 MG tablet Commonly known as:  GLUCOPHAGE TAKE (1) TABLET BY MOUTH TWICE DAILY.   metoprolol 50 MG tablet Commonly known as:  LOPRESSOR TAKE 0.5 (ONE-HALF) TABLET BY MOUTH TWICE DAILY.   multivitamin tablet Take 1 tablet by mouth daily.   omeprazole 20 MG capsule Commonly known as:  PRILOSEC Take 20 mg by mouth daily.   onetouch ultrasoft lancets   polyethylene glycol powder powder Commonly known as:  GLYCOLAX/MIRALAX Take 1 Container by mouth as needed for moderate constipation.   potassium chloride 10 MEQ tablet Commonly known as:  K-DUR Take 10 mEq by mouth daily.   pravastatin 20 MG tablet Commonly known as:  PRAVACHOL TAKE ONE TABLET BY MOUTH DAILY.   torsemide 20 MG tablet Commonly known as:  DEMADEX TAKE 1 TABLET BY MOUTH ONCE DAILY AS NEEDED.   traMADol 50 MG tablet Commonly known as:  ULTRAM Take 1 tablet (50 mg total) by mouth every 6 (six) hours as needed.   valsartan 320 MG tablet Commonly known as:  DIOVAN Take 320 mg by mouth daily.   VICTOZA 18 MG/3ML Sopn Generic drug:  liraglutide INJECT 1.8 MG SUBCUTANEOUSLY ONCE A DAY AS DIRECTED.       Allergies:  Allergies  Allergen Reactions  . Latex     Itching and breaking out    Past Medical History:  Diagnosis Date  . Atrial fibrillation (HCC)    Paroxysmal; LVH; nl EF; onset in 1999  . Burning with urination 02/23/2016  . Diabetes mellitus    insulin; managed by Dr. Kumar  . 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 Hyperlipidemia   . Hypertension   . Hypothyroidism    history of goiter; nl TSH off medication  . Itching with irritation 02/27/2015  . Obesity 07/06/2012  . Palpitations    negative event recorder in 2009  . UTI (urinary tract infection) 02/23/2016    Past Surgical History:  Procedure Laterality Date  . BIOPSY THYROID    . CESAREAN SECTION    . CHOLECYSTECTOMY  11/95  . COLONOSCOPY  2007  .  RETINAL DETACHMENT SURGERY  2009  . TUBAL LIGATION  1980's    Family History  Problem Relation Age of Onset  . Diabetes Mother   . Diabetes Daughter     gestational diabetes  . Cancer Father   . Thyroid disease Brother   . Other Son     MVA      Social History:  reports that she quit smoking about 30 years ago. Her smoking use included Cigarettes. She started smoking about 49 years ago. She smoked 1.00 pack per day. She has never used smokeless tobacco. She reports that she does not drink alcohol or use drugs.  Review of Systems:  Foot pains  she was told to have neuropathy and getting relief with gabapentin  HYPERTENSION:  this is well controlled with current regimen, followed by PCP  HYPERLIPIDEMIA: The lipid abnormality consists of elevated LDL; she has been given Pravachol Labs pending   Lab Results  Component Value Date   CHOL 171 03/07/2016   HDL 60.40 03/07/2016   LDLCALC 86 03/07/2016   TRIG 126.0 03/07/2016   CHOLHDL 3 03/07/2016    Multinodular goiter: She has had a long-standing multinodular goiter since at least 2005 which has been fairly stable clinically She does have history of occasional palpitations for years but has been treated with metoprolol by her cardiologist for this Her goiter has been autonomous. No local pressure symptoms of choking or difficulty swallowing  No evidence of overt hyperthyroidism  with normal free T4 and free T3 levels; TSH has been consistently suppressed  Her I-131 uptake in 2003 was 17% and her  thyroid ultrasound in 2014 did not show distinct nodules but a relative increase in size  Lab Results  Component Value Date   TSH <0.01 (L) 04/28/2016   TSH 0.32 (L) 03/07/2016   FREET4 1.02 05/09/2016   FREET4 1.4 04/28/2016   Lab Results  Component Value Date   T3FREE 3.3 05/09/2016   T3FREE 3.6 10/21/2015   T3FREE 2.6 09/09/2014        Examination:   BP 134/68 (BP Location: Right Arm, Patient Position: Sitting, Cuff  Size: Large)   Pulse 78   Temp 98.2 F (36.8 C) (Oral)   Resp 16   Ht 5' 7" (1.702 m)   Wt 231 lb 2 oz (104.8 kg)   SpO2 98%   BMI 36.20 kg/m   Body mass index is 36.2 kg/m.     ASSESSMENT/ PLAN:  Diabetes Type 2   See history of present illness for detailed discussion of  current management, blood sugar patterns and problems identified  Despite starting back on Victoza she still has inconsistent control and A1c of 7.8 now  She again has postprandial hyperglycemia and is again confused about taking Novolog Does not check enough readings after meals and mostly in the mornings and sporadically overnight She does have periodic high readings in the mornings likely to be from high readings the night before, may also be potentially going off her diet at night with snacks She also does have periodic high readings in the afternoon based on her meal at lunch  Again tried to explain to the patient that we have blood sugar targets of at least 180 after meals Discussed that Novolog needs to be taken proactively before a meal to keep the blood sugars from going up only she is eating a small meals with very little or no carbohydrates  Patient was given a flowsheet to keep a record of her foods that she eats at different meals along with regarding pre-and postprandial blood sugars She will take this to the nurse educator in a month to help her understand when to take Novolog Discussed blood sugar and A1c targets  NEUROPATHY: She is able to walk now, I encouraged her to increase walking  Hypercholesterolemia: Lipids to be rechecked  Hypertension:     controlled   MULTINODULAR goiter: Need to reassess her thyroid levels again today and if free T4 or free T3 are high normal consider thyroid scan and I-131 treatment  Counseling time on subjects discussed above is over 50% of today's 25 minute visit   KUMAR,AJAY 10/11/2016, 12:02 PM     

## 2016-10-17 ENCOUNTER — Other Ambulatory Visit: Payer: Self-pay | Admitting: Endocrinology

## 2016-10-17 DIAGNOSIS — E1165 Type 2 diabetes mellitus with hyperglycemia: Secondary | ICD-10-CM

## 2016-10-17 DIAGNOSIS — Z794 Long term (current) use of insulin: Principal | ICD-10-CM

## 2016-10-18 ENCOUNTER — Other Ambulatory Visit: Payer: Self-pay | Admitting: Cardiovascular Disease

## 2016-10-18 ENCOUNTER — Other Ambulatory Visit: Payer: Self-pay | Admitting: Endocrinology

## 2016-10-19 ENCOUNTER — Encounter: Payer: Medicare Other | Admitting: Nutrition

## 2016-10-21 DIAGNOSIS — L600 Ingrowing nail: Secondary | ICD-10-CM | POA: Diagnosis not present

## 2016-11-03 ENCOUNTER — Telehealth: Payer: Self-pay | Admitting: Endocrinology

## 2016-11-03 NOTE — Telephone Encounter (Signed)
She has been on Victoza since August.  Please ask her if her blood sugars have been better recently.  If she has nausea she can reduce the dose of Victoza to 1.2 instead of 1.8 mg per day is no reason why Victoza should make her neck swell

## 2016-11-03 NOTE — Telephone Encounter (Signed)
Victoza made her very sick and it made her neck swell. Please advise.

## 2016-11-04 NOTE — Telephone Encounter (Signed)
Left a voice mail with advise and requested a call back if she had any questions

## 2016-11-11 ENCOUNTER — Other Ambulatory Visit: Payer: Self-pay | Admitting: Endocrinology

## 2016-11-11 NOTE — Telephone Encounter (Signed)
Rx sent to pharmacy   

## 2016-11-18 ENCOUNTER — Telehealth: Payer: Self-pay | Admitting: Endocrinology

## 2016-11-18 NOTE — Telephone Encounter (Signed)
Patient daughter calling concerning patient Glucometer, please advise

## 2016-11-28 ENCOUNTER — Other Ambulatory Visit: Payer: Self-pay | Admitting: Endocrinology

## 2016-11-28 NOTE — Telephone Encounter (Signed)
Left a voicemail requesting a call back

## 2016-12-06 ENCOUNTER — Other Ambulatory Visit: Payer: Self-pay | Admitting: Endocrinology

## 2016-12-09 NOTE — Telephone Encounter (Signed)
Patient need a new prescription for Lantus flex pen.  [place], [place] [phone] (Phone) [phone] (Fax)

## 2016-12-12 ENCOUNTER — Other Ambulatory Visit: Payer: Self-pay

## 2016-12-12 MED ORDER — INSULIN GLARGINE 100 UNIT/ML SOLOSTAR PEN: PEN_INJECTOR | SUBCUTANEOUS | 0 refills | Status: DC

## 2016-12-12 NOTE — Telephone Encounter (Signed)
Ordered 12/12/16 

## 2016-12-16 ENCOUNTER — Other Ambulatory Visit: Payer: Self-pay | Admitting: Endocrinology

## 2016-12-16 ENCOUNTER — Other Ambulatory Visit: Payer: Self-pay | Admitting: Cardiovascular Disease

## 2016-12-21 ENCOUNTER — Other Ambulatory Visit: Payer: Self-pay | Admitting: Endocrinology

## 2017-01-09 DIAGNOSIS — E118 Type 2 diabetes mellitus with unspecified complications: Secondary | ICD-10-CM | POA: Diagnosis not present

## 2017-01-09 DIAGNOSIS — I10 Essential (primary) hypertension: Secondary | ICD-10-CM | POA: Diagnosis not present

## 2017-01-09 DIAGNOSIS — Z0000 Encounter for general adult medical examination without abnormal findings: Secondary | ICD-10-CM | POA: Diagnosis not present

## 2017-01-10 ENCOUNTER — Other Ambulatory Visit: Payer: Self-pay | Admitting: Endocrinology

## 2017-01-10 ENCOUNTER — Other Ambulatory Visit: Payer: Self-pay | Admitting: Cardiovascular Disease

## 2017-01-11 ENCOUNTER — Ambulatory Visit: Payer: Medicare Other | Admitting: Endocrinology

## 2017-01-18 ENCOUNTER — Other Ambulatory Visit: Payer: Self-pay | Admitting: Endocrinology

## 2017-01-20 ENCOUNTER — Ambulatory Visit: Payer: Medicare Other | Admitting: Endocrinology

## 2017-01-20 DIAGNOSIS — L603 Nail dystrophy: Secondary | ICD-10-CM | POA: Diagnosis not present

## 2017-01-20 DIAGNOSIS — I739 Peripheral vascular disease, unspecified: Secondary | ICD-10-CM | POA: Diagnosis not present

## 2017-01-20 DIAGNOSIS — E1151 Type 2 diabetes mellitus with diabetic peripheral angiopathy without gangrene: Secondary | ICD-10-CM | POA: Diagnosis not present

## 2017-01-20 DIAGNOSIS — L84 Corns and callosities: Secondary | ICD-10-CM | POA: Diagnosis not present

## 2017-01-26 ENCOUNTER — Ambulatory Visit (INDEPENDENT_AMBULATORY_CARE_PROVIDER_SITE_OTHER): Payer: Medicare Other | Admitting: Adult Health

## 2017-01-26 ENCOUNTER — Encounter: Payer: Self-pay | Admitting: Adult Health

## 2017-01-26 ENCOUNTER — Telehealth: Payer: Self-pay | Admitting: Adult Health

## 2017-01-26 VITALS — BP 128/70 | Ht 67.0 in | Wt 238.0 lb

## 2017-01-26 DIAGNOSIS — N898 Other specified noninflammatory disorders of vagina: Secondary | ICD-10-CM | POA: Diagnosis not present

## 2017-01-26 DIAGNOSIS — N3946 Mixed incontinence: Secondary | ICD-10-CM

## 2017-01-26 MED ORDER — NYSTATIN-TRIAMCINOLONE 100000-0.1 UNIT/GM-% EX OINT
1.0000 | TOPICAL_OINTMENT | Freq: Two times a day (BID) | CUTANEOUS | 2 refills | Status: DC
Start: 2017-01-26 — End: 2020-03-30

## 2017-01-26 NOTE — Progress Notes (Signed)
Subjective:     Patient ID: Brittney [name], female   DOB: 1946/01/07, 71 y.o.   MRN: [id number]  HPI Brittney [name] is a 71 year old black female in complaining of vaginal irritation esp on outside for about a week, and has urinary incontinence with coughing or sneezing and when has to go esp at night, she is not sexually active.  Review of Systems Vaginal irritation Has urinary incontinence with coughing or sneezing and when has to void esp at night Not sexually active Reviewed past medical,surgical, social and family history. Reviewed medications and allergies.     Objective:   Physical Exam BP 128/70 (BP Location: Left Arm, Patient Position: Sitting, Cuff Size: Normal)   Ht 5\' 7"  (1.702 m)   Wt 238 lb (108 kg)   BMI 37.28 kg/m    Skin warm and dry.Pelvic: external genitalia is normal in appearance, but has some moisture changes , vagina: atrophic,urethra has no lesions or masses noted, cervix:smooth and bulbous, uterus: normal size, shape and contour, non tender, no masses felt, adnexa: no masses or tenderness noted. Bladder is non tender and no masses felt.  Discussed trying meds, but not interested at this time.  Assessment:     1. Vaginal irritation   2. Mixed stress and urge urinary incontinence       Plan:     Meds ordered this encounter  Medications  . nystatin-triamcinolone ointment (MYCOLOG)    Sig: Apply 1 application topically 2 (two) times daily.    Dispense:  30 g    Refill:  2    Order Specific Question:   Supervising Provider    Answer:   Tania Ade H [[id number]]  Can try zinc oxide and aqua phor too Try pads without wings  Keep clean and dry Follow up prn

## 2017-01-26 NOTE — Telephone Encounter (Signed)
Pt called stating that she came into her appointment this morning and Brittney [name] has prescribed her some medication's and has a question regarding one of her medication. Please contact pt

## 2017-01-27 NOTE — Telephone Encounter (Signed)
Pt had a question regarding the nystatin ointment that Anderson Malta prescribed her yesterday. She wanted to know if she should apply the cream inside the vagina. I informed patient that it is a topical cream for external use and to only apply to labia. Pt verbalized understanding.

## 2017-02-01 ENCOUNTER — Other Ambulatory Visit: Payer: Self-pay | Admitting: Endocrinology

## 2017-02-03 ENCOUNTER — Ambulatory Visit (INDEPENDENT_AMBULATORY_CARE_PROVIDER_SITE_OTHER): Payer: Medicare Other | Admitting: Endocrinology

## 2017-02-03 ENCOUNTER — Encounter: Payer: Self-pay | Admitting: Endocrinology

## 2017-02-03 VITALS — BP 156/72 | HR 69 | Ht 67.0 in | Wt 234.0 lb

## 2017-02-03 DIAGNOSIS — R946 Abnormal results of thyroid function studies: Secondary | ICD-10-CM

## 2017-02-03 DIAGNOSIS — R7989 Other specified abnormal findings of blood chemistry: Secondary | ICD-10-CM

## 2017-02-03 DIAGNOSIS — I10 Essential (primary) hypertension: Secondary | ICD-10-CM | POA: Diagnosis not present

## 2017-02-03 DIAGNOSIS — E1165 Type 2 diabetes mellitus with hyperglycemia: Secondary | ICD-10-CM | POA: Diagnosis not present

## 2017-02-03 DIAGNOSIS — Z794 Long term (current) use of insulin: Secondary | ICD-10-CM | POA: Diagnosis not present

## 2017-02-03 MED ORDER — EXENATIDE 5 MCG/0.02ML ~~LOC~~ SOPN: 5.0000 ug | PEN_INJECTOR | Freq: Two times a day (BID) | SUBCUTANEOUS | 3 refills | Status: DC

## 2017-02-03 NOTE — Patient Instructions (Addendum)
Stop Novolog  Must do some sugars after meal  [place] before Bfst and supper

## 2017-02-03 NOTE — Progress Notes (Signed)
Patient ID: Brittney [name], female   DOB: April 29, 1946, 71 y.o.   MRN: [id number]   Reason for Appointment: Followup of diabetes  History of Present Illness    Type 2 DIABETES MELITUS, date of diagnosis: 1976      She has had long-standing diabetes taking insulin. Because of her difficulties with compliance using mealtime insulin she was given in [place] in 2011 and subsequently Victoza in addition to her Lantus and this helped her control However she tends to be erratic with her day-to-day care and has not had adequate A1c levels in the past, usually over 7%  RECENT history:   Insulin regimen: Lantus 42 units daily in a.m., NovoLog 6 units at times before mostly before supper  Her A1c has been consistently around 8% and she thinks that it was 8% or more with her PCP recently but reports are not available  Current glucose patterns and problems:  She has checked blood sugars mostly in the morning recently  Although these are fairly good on an average she has some fluctuation including an early morning reading of 62  She has had only  a few after meal readings last month when she was not feeling well  Previously was given a diary to keep her blood sugars, food intake and NovoLog insulin recorded so she could understand when to take the Novolog and how to adjust it but she did not do any of these exercises  She still does not understand the need to take insulin before even her main meals  She stopped taking VICTOZA about a month ago because thought it was causing swelling in her neck, also despite reassurance she did not go back on it  Has only occasional readings during the night, recently had a reading of about 170  FASTING readings are recently not has variable but still overall mildly increased  She did wake up one morning hypoglycemia, she thinks this is from eating her dinner early the night before  Her weight is somewhat better    Oral hypoglycemic drugs:  Metformin        Side effects from medications:  Invokana:? Headache      Monitors blood glucose: 3 a day.    Glucometer: Accucheck         Blood Glucose readings from meter download:  Mean values apply above for all meters except median for One Touch  PRE-MEAL Fasting Lunch Dinner Bedtime Overall  Glucose range: 62-195  182  183, 198  216  62-216   Mean/median:     146     Meals: 2-3 meals per day. . Dinner 7-8 pm, variable quantity Does have protein with breakfast consistently, has egg Physical activity: exercise: walkingVery sporadically recently           Wt Readings from Last 3 Encounters:  02/03/17 234 lb (106.1 kg)  01/26/17 238 lb (108 kg)  10/11/16 231 lb 2 oz (104.8 kg)   GLYCEMIC CONTROL:  Lab Results  Component Value Date   HGBA1C 7.8 10/11/2016   HGBA1C 8.0 07/08/2016   HGBA1C 8.1 03/07/2016   Lab Results  Component Value Date   MICROALBUR 0.7 10/07/2015   LDLCALC 84 10/11/2016   CREATININE 0.92 10/11/2016      Other problems discussed today: See review of systems   Allergies as of 02/03/2017      Reactions   Latex    Itching and breaking out      Medication List  Accurate as of 02/03/17 11:59 PM. Always use your most recent med list.          ACCU-CHEK AVIVA PLUS test strip Generic drug:  glucose blood USE TO TEST TWICE DAILY.   ACCU-CHEK AVIVA PLUS w/Device Kit Use to check blood sugar 2 times per day dx code E11.65   ALPRAZolam 0.5 MG tablet Commonly known as:  XANAX Take 0.5 mg by mouth at bedtime. Takes half a tab at bedtime   amLODipine 10 MG tablet Commonly known as:  NORVASC TAKE (1) TABLET BY MOUTH ONCE DAILY.   B-D ULTRAFINE III SHORT PEN 31G X 8 MM Misc Generic drug:  Insulin Pen Needle USE AS DIRECTED 2-3 TIMES A DAY.   exenatide 5 MCG/0.02ML Sopn injection Commonly known as:  BYETTA 5 MCG PEN Inject 0.02 mLs (5 mcg total) into the skin 2 (two) times daily before a meal.   gabapentin 100 MG capsule Commonly  known as:  NEURONTIN Take 300 mg by mouth at bedtime.   ibuprofen 200 MG tablet Commonly known as:  ADVIL,MOTRIN Take 400 mg by mouth as needed.   LANTUS SOLOSTAR 100 UNIT/ML Solostar Pen Generic drug:  Insulin Glargine INJECT 44 UNITS SUBCUTANEOUSLY ONCE EVERY MORNING.   metFORMIN 850 MG tablet Commonly known as:  GLUCOPHAGE TAKE (1) TABLET BY MOUTH TWICE DAILY.   metoprolol 50 MG tablet Commonly known as:  LOPRESSOR TAKE 0.5 (ONE-HALF) TABLET BY MOUTH TWICE DAILY.   multivitamin tablet Take 1 tablet by mouth once a week.   nystatin-triamcinolone ointment Commonly known as:  MYCOLOG Apply 1 application topically 2 (two) times daily.   omeprazole 20 MG capsule Commonly known as:  PRILOSEC Take 20 mg by mouth daily.   onetouch ultrasoft lancets   polyethylene glycol powder powder Commonly known as:  GLYCOLAX/MIRALAX Take 1 Container by mouth as needed for moderate constipation.   potassium chloride 10 MEQ tablet Commonly known as:  K-DUR Take 10 mEq by mouth daily.   pravastatin 20 MG tablet Commonly known as:  PRAVACHOL TAKE ONE TABLET BY MOUTH DAILY.   torsemide 20 MG tablet Commonly known as:  DEMADEX TAKE 1 TABLET BY MOUTH ONCE DAILY AS NEEDED.   valsartan 320 MG tablet Commonly known as:  DIOVAN Take 320 mg by mouth daily.       Allergies:  Allergies  Allergen Reactions  . Latex     Itching and breaking out    Past Medical History:  Diagnosis Date  . Atrial fibrillation (HCC)    Paroxysmal; LVH; nl EF; onset in 1999  . Burning with urination 02/23/2016  . Diabetes mellitus    insulin; managed by Dr. Dwyane Dee  . Diabetic Charcot's joint disease ([place])    left lower extremity  . Hyperlipidemia   . Hypertension   . Hypothyroidism    history of goiter; nl TSH off medication  . Itching with irritation 02/27/2015  . Obesity 07/06/2012  . Palpitations    negative event recorder in 2009  . UTI (urinary tract infection) 02/23/2016    Past Surgical  History:  Procedure Laterality Date  . BIOPSY THYROID    . CESAREAN SECTION    . CHOLECYSTECTOMY  11/95  . COLONOSCOPY  2007  . RETINAL DETACHMENT SURGERY  2009  . TUBAL LIGATION  1980's    Family History  Problem Relation Age of Onset  . Diabetes Mother   . Diabetes Daughter     gestational diabetes  . Cancer Father   . Thyroid disease  Brother   . Other Son     MVA    Social History:  reports that she quit smoking about 30 years ago. Her smoking use included Cigarettes. She started smoking about 50 years ago. She smoked 1.00 pack per day. She has never used smokeless tobacco. She reports that she does not drink alcohol or use drugs.  Review of Systems:   HYPERTENSION:  She recently saw her PCP and was not told to change her medications, she thinks her blood pressure has been previously better but her today.  Also taking metoprolol   HYPERLIPIDEMIA: The lipid abnormality consists of elevated LDL; she has been given Pravachol Labs pending   Lab Results  Component Value Date   CHOL 168 10/11/2016   HDL 61.80 10/11/2016   LDLCALC 84 10/11/2016   TRIG 114.0 10/11/2016   CHOLHDL 3 10/11/2016    Multinodular goiter: She has had a long-standing multinodular goiter since at least 2005 which has been fairly stable clinically She does have history of occasional palpitations for years but has been treated with metoprolol by her cardiologist for this Her goiter has been autonomous. No local pressure symptoms of choking or difficulty swallowing  No evidence of overt hyperthyroidism  with normal free T4 and free T3 levels; TSH has been consistently suppressed  Her I-131 uptake in 2003 was 17% and her  thyroid ultrasound in 2014 did not show distinct nodules but a relative increase in size  Lab Results  Component Value Date   TSH 0.12 (L) 10/11/2016   TSH <0.01 (L) 04/28/2016   FREET4 1.01 10/11/2016   FREET4 1.02 05/09/2016   Lab Results  Component Value Date   T3FREE 3.3  10/11/2016   T3FREE 3.3 05/09/2016   T3FREE 3.6 10/21/2015   Recently complaining of back pain     Examination:   BP (!) 156/72   Pulse 69   Ht 5' 7"  (1.702 m)   Wt 234 lb (106.1 kg)   SpO2 96%   BMI 36.65 kg/m   Body mass index is 36.65 kg/m.   She has a large diffuse multinodular goiter, neck circumference is 42 centimeters No tenderness  ASSESSMENT/ PLAN:  Diabetes Type 2   See history of present illness for detailed discussion of  current management, blood sugar patterns and problems identified  Blood sugars are still not well controlled and recently A1c is over 8% Again his reluctant to take any mealtime coverage despite her blood sugars being frequently high after meals Also she is not checking readings after meals as directed Currently off Victoza also and not clear how much this was helping  Recommendations:   Discussed options of using the V-go pump for better compliance with mealtime insulin but she is afraid of taking any mealtime insulin at all for fear of hypoglycemia..  Discussed how this would be effective in controlling her sugars and how it would work  Will give her a trial of [place] before breakfast and supper and she is more agreeable to doing this to help control her postprandial readings discussed how this works, timing for injection, possible side effects of nausea and will use 5 g to start with.  For now she will continue her Lantus unchanged as her fasting readings may not come down with [place]  Reminded her again to check readings after meals more consistently   Hypercholesterolemia: Lipids were controlled as of 11/17  Hypertension: Recently not  controlled and she thinks this is from recent back pain  MULTINODULAR goiter: Need to reassess her thyroid levels again on her next visit, she has not had any changes in her levels.  Since her previous I-131 uptake was only 17% she currently is likely to benefit from I-131 treatment  Counseling time  on subjects discussed above is over 50% of today's 25 minute visit   Chandlar Guice 02/06/2017, 8:00 AM

## 2017-02-13 ENCOUNTER — Other Ambulatory Visit: Payer: Self-pay | Admitting: Endocrinology

## 2017-02-23 ENCOUNTER — Telehealth: Payer: Self-pay | Admitting: Endocrinology

## 2017-02-23 MED ORDER — GABAPENTIN 100 MG PO CAPS: 300.0000 mg | ORAL_CAPSULE | Freq: Every day | ORAL | 2 refills | Status: DC

## 2017-02-23 NOTE — Telephone Encounter (Signed)
Refill submitted. 

## 2017-02-23 NOTE — Telephone Encounter (Signed)
Pt needs Gabapentin refilled and sent to [facility]. She said she is having numbness in her hands.

## 2017-03-13 ENCOUNTER — Other Ambulatory Visit: Payer: Self-pay | Admitting: Endocrinology

## 2017-03-16 ENCOUNTER — Telehealth: Payer: Self-pay | Admitting: Endocrinology

## 2017-03-16 ENCOUNTER — Other Ambulatory Visit: Payer: Self-pay

## 2017-03-16 MED ORDER — FLUVASTATIN SODIUM 20 MG PO CAPS: 20.0000 mg | ORAL_CAPSULE | Freq: Every day | ORAL | 3 refills | Status: DC

## 2017-03-16 NOTE — Telephone Encounter (Signed)
Brittney [name] with [facility] called and said that she is working along side this patient, and that the Pt has informed her that she is no longer taking the Pravastatin, because it gives her cramps, Brittney [name] was wanting to know if maybe there is something alternative the Pt can take. Brittney [name] [facility] # [phone]

## 2017-03-16 NOTE — Telephone Encounter (Signed)
Please send new prescription for Lescol 20 mg daily

## 2017-03-16 NOTE — Telephone Encounter (Signed)
Ordered

## 2017-04-03 ENCOUNTER — Other Ambulatory Visit: Payer: Self-pay | Admitting: Endocrinology

## 2017-04-07 ENCOUNTER — Other Ambulatory Visit: Payer: Self-pay | Admitting: Endocrinology

## 2017-04-10 DIAGNOSIS — I10 Essential (primary) hypertension: Secondary | ICD-10-CM | POA: Diagnosis not present

## 2017-04-10 DIAGNOSIS — J301 Allergic rhinitis due to pollen: Secondary | ICD-10-CM | POA: Diagnosis not present

## 2017-04-10 DIAGNOSIS — R05 Cough: Secondary | ICD-10-CM | POA: Diagnosis not present

## 2017-04-10 DIAGNOSIS — E1142 Type 2 diabetes mellitus with diabetic polyneuropathy: Secondary | ICD-10-CM | POA: Diagnosis not present

## 2017-04-12 ENCOUNTER — Ambulatory Visit: Payer: Medicare Other | Admitting: Endocrinology

## 2017-05-10 ENCOUNTER — Other Ambulatory Visit: Payer: Self-pay | Admitting: Cardiovascular Disease

## 2017-05-10 ENCOUNTER — Other Ambulatory Visit: Payer: Self-pay | Admitting: Endocrinology

## 2017-05-16 ENCOUNTER — Other Ambulatory Visit: Payer: Self-pay | Admitting: Endocrinology

## 2017-05-16 NOTE — Telephone Encounter (Signed)
This prescription was verbally okay to refill per Dr. Dwyane Dee.

## 2017-05-29 ENCOUNTER — Other Ambulatory Visit: Payer: Self-pay | Admitting: Endocrinology

## 2017-06-05 ENCOUNTER — Other Ambulatory Visit: Payer: Self-pay | Admitting: Endocrinology

## 2017-06-08 ENCOUNTER — Telehealth: Payer: Self-pay

## 2017-06-08 ENCOUNTER — Ambulatory Visit (INDEPENDENT_AMBULATORY_CARE_PROVIDER_SITE_OTHER): Payer: Medicare Other | Admitting: Endocrinology

## 2017-06-08 ENCOUNTER — Encounter: Payer: Self-pay | Admitting: Endocrinology

## 2017-06-08 VITALS — BP 136/82 | HR 70 | Ht 67.0 in | Wt 235.4 lb

## 2017-06-08 DIAGNOSIS — E1165 Type 2 diabetes mellitus with hyperglycemia: Secondary | ICD-10-CM

## 2017-06-08 DIAGNOSIS — E1169 Type 2 diabetes mellitus with other specified complication: Secondary | ICD-10-CM | POA: Diagnosis not present

## 2017-06-08 DIAGNOSIS — Z794 Long term (current) use of insulin: Secondary | ICD-10-CM

## 2017-06-08 DIAGNOSIS — R946 Abnormal results of thyroid function studies: Secondary | ICD-10-CM | POA: Diagnosis not present

## 2017-06-08 DIAGNOSIS — E669 Obesity, unspecified: Secondary | ICD-10-CM

## 2017-06-08 DIAGNOSIS — R7989 Other specified abnormal findings of blood chemistry: Secondary | ICD-10-CM

## 2017-06-08 DIAGNOSIS — E782 Mixed hyperlipidemia: Secondary | ICD-10-CM

## 2017-06-08 DIAGNOSIS — I10 Essential (primary) hypertension: Secondary | ICD-10-CM

## 2017-06-08 LAB — BASIC METABOLIC PANEL
BUN: 20 mg/dL (ref 6–23)
CALCIUM: 9.7 mg/dL (ref 8.4–10.5)
CO2: 29 mEq/L (ref 19–32)
CREATININE: 1.01 mg/dL (ref 0.40–1.20)
Chloride: 103 mEq/L (ref 96–112)
GFR: 69.67 mL/min (ref >60.00)
Glucose, Bld: 117 mg/dL — ABNORMAL HIGH (ref 70–99)
Potassium: 4.5 mEq/L (ref 3.5–5.1)
Sodium: 137 mEq/L (ref 135–145)

## 2017-06-08 LAB — LIPID PANEL
CHOL/HDL RATIO: 3
Cholesterol: 162 mg/dL (ref 0–200)
HDL: 63.5 mg/dL (ref >39.00)
LDL CALC: 82 mg/dL (ref 0–99)
NonHDL: 98.95
TRIGLYCERIDES: 86 mg/dL (ref 0.0–149.0)
VLDL: 17.2 mg/dL (ref 0.0–40.0)

## 2017-06-08 LAB — TSH: TSH: 0.02 u[IU]/mL — ABNORMAL LOW (ref 0.35–4.50)

## 2017-06-08 LAB — T4, FREE: Free T4: 0.74 ng/dL (ref 0.60–1.60)

## 2017-06-08 LAB — MICROALBUMIN / CREATININE URINE RATIO
CREATININE, U: 22.9 mg/dL
Microalb Creat Ratio: 3.1 mg/g (ref 0.0–30.0)

## 2017-06-08 LAB — T3, FREE: T3, Free: 3 pg/mL (ref 2.3–4.2)

## 2017-06-08 LAB — POCT GLYCOSYLATED HEMOGLOBIN (HGB A1C): Hemoglobin A1C: 8.3

## 2017-06-08 NOTE — Progress Notes (Signed)
Please call to let patient know that the lab results are normal and no further action needed

## 2017-06-08 NOTE — Progress Notes (Signed)
Patient ID: Brittney [name], female   DOB: 30-Nov-1945, 71 y.o.   MRN: [id number]   Reason for Appointment: Followup of diabetes  History of Present Illness    Type 2 DIABETES MELITUS, date of diagnosis: 1976      She has had long-standing diabetes taking insulin. Because of her difficulties with compliance using mealtime insulin she was given in Byetta in 2011 and subsequently Victoza in addition to her Lantus and this helped her control However she tends to be erratic with her day-to-day care and has not had adequate A1c levels in the past, usually over 7%  RECENT history:   Insulin regimen: Lantus 42 units daily in a.m., NovoLog 0-6 units at times   Non-insulin hypoglycemic drugs: Byetta 5 g before breakfast, Metformin   850 twice a day  Her A1c has been consistently around 8% and is now 8.3% Again records from PCP are not available  Current glucose patterns and problems:  She has been reluctant to take mealtime insulin and is taking Byetta instead  She was supposed to take this for breakfast and suppertime but is only doing this in the morning  She claims that she is sometimes taking Novolog when the blood sugars are high but has only rare high blood sugars and minimal monitoring after meals  She cannot explain the high reading of 218 around lunchtime, does think that she is taking her Byetta before eating consistently  Has only one reading at bedtime of 93  FASTING readings are recently quite variable with some normal readings and lowest reading of 59  Her weight is about the same despite her saying that she is trying to walk    Side effects from medications:  Invokana:? Headache.  Victoza ?  Swelling in neck      Monitors blood glucose: 3 a day.    Glucometer: Accucheck         Blood Glucose readings from meter download:  Mean values apply above for all meters except median for One Touch  PRE-MEAL Fasting Lunch Dinner Bedtime Overall  Glucose range:  59-206 218   93    Mean/median: 137    132      Meals: 2-3 meals per day. . Dinner 7 pm, variable quantity Does have protein with breakfast consistently, has egg  Physical activity: exercise: walking some recently           Wt Readings from Last 3 Encounters:  06/08/17 235 lb 6.4 oz (106.8 kg)  02/03/17 234 lb (106.1 kg)  01/26/17 238 lb (108 kg)   GLYCEMIC CONTROL:  Lab Results  Component Value Date   HGBA1C 7.8 10/11/2016   HGBA1C 8.0 07/08/2016   HGBA1C 8.1 03/07/2016   Lab Results  Component Value Date   MICROALBUR 0.7 10/07/2015   LDLCALC 84 10/11/2016   CREATININE 0.92 10/11/2016      Other problems discussed today: See review of systems   Allergies as of 06/08/2017      Reactions   Latex    Itching and breaking out      Medication List       Accurate as of 06/08/17 10:54 AM. Always use your most recent med list.          ACCU-CHEK AVIVA PLUS test strip Generic drug:  glucose blood USE TO TEST TWICE DAILY.   ACCU-CHEK AVIVA PLUS w/Device Kit Use to check blood sugar 2 times per day dx code E11.65   ALPRAZolam 0.5 MG tablet  Commonly known as:  XANAX Take 0.5 mg by mouth at bedtime. Takes half a tab at bedtime   amLODipine 10 MG tablet Commonly known as:  NORVASC TAKE (1) TABLET BY MOUTH ONCE DAILY.   B-D ULTRAFINE III SHORT PEN 31G X 8 MM Misc Generic drug:  Insulin Pen Needle USE AS DIRECTED 2-3 TIMES A DAY.   exenatide 5 MCG/0.02ML Sopn injection Commonly known as:  BYETTA 5 MCG PEN Inject 0.02 mLs (5 mcg total) into the skin 2 (two) times daily before a meal.   fluvastatin 20 MG capsule Commonly known as:  LESCOL Take 1 capsule (20 mg total) by mouth at bedtime.   gabapentin 100 MG capsule Commonly known as:  NEURONTIN Take 3 capsules (300 mg total) by mouth at bedtime.   HUMALOG KWIKPEN 100 UNIT/ML KiwkPen Generic drug:  insulin lispro INJECT 6 TO 7 UNITS SUBCUTANEOUSLY BEFORE SUPPER IF GLUCOSE IS HIGH.   ibuprofen 200 MG  tablet Commonly known as:  ADVIL,MOTRIN Take 400 mg by mouth as needed.   LANTUS SOLOSTAR 100 UNIT/ML Solostar Pen Generic drug:  Insulin Glargine INJECT 44 UNITS SUBCUTANEOUSLY ONCE EVERY MORNING.   loratadine 10 MG tablet Commonly known as:  CLARITIN Take 10 mg by mouth daily as needed for allergies.   metFORMIN 850 MG tablet Commonly known as:  GLUCOPHAGE TAKE (1) TABLET BY MOUTH TWICE DAILY.   metoprolol tartrate 50 MG tablet Commonly known as:  LOPRESSOR TAKE 0.5 (ONE-HALF) TABLET BY MOUTH TWICE DAILY.   multivitamin tablet Take 1 tablet by mouth once a week.   nystatin-triamcinolone ointment Commonly known as:  MYCOLOG Apply 1 application topically 2 (two) times daily.   omeprazole 20 MG capsule Commonly known as:  PRILOSEC Take 20 mg by mouth daily.   onetouch ultrasoft lancets   polyethylene glycol powder powder Commonly known as:  GLYCOLAX/MIRALAX Take 1 Container by mouth as needed for moderate constipation.   potassium chloride 10 MEQ tablet Commonly known as:  K-DUR Take 10 mEq by mouth daily.   pravastatin 20 MG tablet Commonly known as:  PRAVACHOL TAKE ONE TABLET BY MOUTH DAILY.   torsemide 20 MG tablet Commonly known as:  DEMADEX TAKE 1 TABLET BY MOUTH ONCE DAILY AS NEEDED.   valsartan 320 MG tablet Commonly known as:  DIOVAN Take 320 mg by mouth daily.       Allergies:  Allergies  Allergen Reactions  . Latex     Itching and breaking out    Past Medical History:  Diagnosis Date  . Atrial fibrillation (HCC)    Paroxysmal; LVH; nl EF; onset in 1999  . Burning with urination 02/23/2016  . Diabetes mellitus    insulin; managed by Dr. Dwyane Dee  . 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 Hyperlipidemia   . Hypertension   . Hypothyroidism    history of goiter; nl TSH off medication  . Itching with irritation 02/27/2015  . Obesity 07/06/2012  . Palpitations    negative event recorder in 2009  . UTI (urinary tract  infection) 02/23/2016    Past Surgical History:  Procedure Laterality Date  . BIOPSY THYROID    . CESAREAN SECTION    . CHOLECYSTECTOMY  11/95  . COLONOSCOPY  2007  . RETINAL DETACHMENT SURGERY  2009  . TUBAL LIGATION  1980's    Family History  Problem Relation Age of Onset  . Diabetes Mother   . Diabetes Daughter  gestational diabetes  . Cancer Father   . Thyroid disease Brother   . Other Son        MVA    Social History:  reports that she quit smoking about 30 years ago. Her smoking use included Cigarettes. She started smoking about 50 years ago. She smoked 1.00 pack per day. She has never used smokeless tobacco. She reports that she does not drink alcohol or use drugs.  Review of Systems:   HYPERTENSION:  Followed by PCP   HYPERLIPIDEMIA: The lipid abnormality consists of elevated LDL; she has been given Pravachol Although she thinks this is giving her some cramps she is willing to take it.  Apparently Lescol may not have been covered by her insurance Labs pending   Lab Results  Component Value Date   CHOL 168 10/11/2016   HDL 61.80 10/11/2016   LDLCALC 84 10/11/2016   TRIG 114.0 10/11/2016   CHOLHDL 3 10/11/2016    Multinodular goiter: She has had a long-standing multinodular goiter since at least 2005 which has been fairly stable clinically She does have history of occasional palpitations for years but has been treated with metoprolol by her cardiologist for this Her goiter has been autonomous. No local pressure symptoms of choking or difficulty swallowing  No evidence of overt hyperthyroidism  with normal free T4 and free T3 levels; TSH has been consistently suppressed  Her I-131 uptake in 2003 was 17% and her  thyroid ultrasound in 2014 did not show distinct nodules but a relative increase in size  Lab Results  Component Value Date   TSH 0.12 (L) 10/11/2016   TSH <0.01 (L) 04/28/2016   FREET4 1.01 10/11/2016   FREET4 1.02 05/09/2016   Lab  Results  Component Value Date   T3FREE 3.3 10/11/2016   T3FREE 3.3 05/09/2016   T3FREE 3.6 10/21/2015       Examination:   BP 136/82   Pulse 70   Ht 5' 7"  (1.702 m)   Wt 235 lb 6.4 oz (106.8 kg)   SpO2 98%   BMI 36.87 kg/m   Body mass index is 36.87 kg/m.     ASSESSMENT/ PLAN:  Diabetes Type 2   See history of present illness for detailed discussion of  current management, blood sugar patterns and problems identified  Blood sugars are still not well controlled and A1c is over 8% This is likely to be from postprandial hyperglycemia as her fasting readings are averaging around 140 This was explained to the patient She keeps forgetting to check her readings after meals despite reminders and discussed that this is important to know to help identify her treatment plan and improved control Some of the high readings in the morning are probably related to higher readings the night before She refuses to take Novolog at suppertime because of fear of hypoglycemia Also not clear if morning Byetta is helping since she has only one reading at lunchtime  Recommendations:  Discussed checking blood sugars as above including after meals much more often Start taking Byetta twice a day If her sugars are over 200 after meals she will increase the dose to 10 g, she was told to call about this More consistent exercise Low carbohydrate meals and consider consultation with dietitian again  Hypercholesterolemia: Lipids Need to be rechecked, she is still taking pravastatin   MULTINODULAR goiter: Need to reassess her thyroid levels again for her autonomous thyroid  Counseling time on subjects discussed in assessment and plan sections is over 50%  of today's 25 minute visit   Breunna Nordmann 06/08/2017, 10:54 AM

## 2017-06-08 NOTE — Patient Instructions (Addendum)
BYETTA before breakfast and supper both 30 min before the meal  MORE SUGARS AFTER MEALS  Walk daily  Check blood sugars on waking up  3/7  Also check blood sugars about 2 hours after a meal and do this after different meals by rotation  Recommended blood sugar levels on waking up is 90-130 and about 2 hours after meal is 130-160  Please bring your blood sugar monitor to each visit, thank you

## 2017-06-08 NOTE — Telephone Encounter (Signed)
Called the [facility], Dr. Ellin Mayhew at [phone] and spoke to [facility] who put me on hold for over 5 minutes. Dr. Dwyane Dee stated to go ahead and do an a1c

## 2017-06-09 LAB — FRUCTOSAMINE: Fructosamine: 344 umol/L — ABNORMAL HIGH (ref 0–285)

## 2017-06-26 ENCOUNTER — Other Ambulatory Visit: Payer: Self-pay | Admitting: Endocrinology

## 2017-07-10 DIAGNOSIS — I10 Essential (primary) hypertension: Secondary | ICD-10-CM | POA: Diagnosis not present

## 2017-07-10 DIAGNOSIS — E1142 Type 2 diabetes mellitus with diabetic polyneuropathy: Secondary | ICD-10-CM | POA: Diagnosis not present

## 2017-07-11 DIAGNOSIS — I739 Peripheral vascular disease, unspecified: Secondary | ICD-10-CM | POA: Diagnosis not present

## 2017-07-11 DIAGNOSIS — E1051 Type 1 diabetes mellitus with diabetic peripheral angiopathy without gangrene: Secondary | ICD-10-CM | POA: Diagnosis not present

## 2017-07-12 ENCOUNTER — Other Ambulatory Visit: Payer: Self-pay | Admitting: Endocrinology

## 2017-07-21 ENCOUNTER — Other Ambulatory Visit: Payer: Self-pay | Admitting: Endocrinology

## 2017-08-10 ENCOUNTER — Ambulatory Visit (INDEPENDENT_AMBULATORY_CARE_PROVIDER_SITE_OTHER): Payer: Medicare Other | Admitting: Ophthalmology

## 2017-08-11 ENCOUNTER — Other Ambulatory Visit: Payer: Self-pay | Admitting: Endocrinology

## 2017-08-15 ENCOUNTER — Other Ambulatory Visit (HOSPITAL_COMMUNITY): Payer: Self-pay | Admitting: Family Medicine

## 2017-08-15 DIAGNOSIS — Z1231 Encounter for screening mammogram for malignant neoplasm of breast: Secondary | ICD-10-CM

## 2017-08-24 ENCOUNTER — Ambulatory Visit (HOSPITAL_COMMUNITY)
Admission: RE | Admit: 2017-08-24 | Discharge: 2017-08-24 | Disposition: A | Discharge status: Home / Self Care | Payer: Medicare Other | Source: Ambulatory Visit | Attending: Family Medicine | Admitting: Family Medicine

## 2017-08-24 DIAGNOSIS — Z1231 Encounter for screening mammogram for malignant neoplasm of breast: Secondary | ICD-10-CM | POA: Diagnosis not present

## 2017-08-28 ENCOUNTER — Other Ambulatory Visit: Payer: Self-pay | Admitting: Endocrinology

## 2017-09-08 ENCOUNTER — Encounter: Payer: Self-pay | Admitting: Endocrinology

## 2017-09-08 ENCOUNTER — Ambulatory Visit (INDEPENDENT_AMBULATORY_CARE_PROVIDER_SITE_OTHER): Payer: Medicare Other | Admitting: Endocrinology

## 2017-09-08 VITALS — BP 150/80 | HR 72 | Ht 67.0 in | Wt 236.4 lb

## 2017-09-08 DIAGNOSIS — E1165 Type 2 diabetes mellitus with hyperglycemia: Secondary | ICD-10-CM

## 2017-09-08 DIAGNOSIS — I10 Essential (primary) hypertension: Secondary | ICD-10-CM | POA: Diagnosis not present

## 2017-09-08 DIAGNOSIS — Z23 Encounter for immunization: Secondary | ICD-10-CM | POA: Diagnosis not present

## 2017-09-08 DIAGNOSIS — E0590 Thyrotoxicosis, unspecified without thyrotoxic crisis or storm: Secondary | ICD-10-CM | POA: Diagnosis not present

## 2017-09-08 DIAGNOSIS — Z794 Long term (current) use of insulin: Secondary | ICD-10-CM | POA: Diagnosis not present

## 2017-09-08 LAB — BASIC METABOLIC PANEL
BUN: 19 mg/dL (ref 6–23)
CHLORIDE: 100 meq/L (ref 96–112)
CO2: 29 meq/L (ref 19–32)
CREATININE: 1.01 mg/dL (ref 0.40–1.20)
Calcium: 9.8 mg/dL (ref 8.4–10.5)
GFR: 69.62 mL/min (ref >60.00)
GLUCOSE: 175 mg/dL — AB (ref 70–99)
Potassium: 4.6 mEq/L (ref 3.5–5.1)
Sodium: 136 mEq/L (ref 135–145)

## 2017-09-08 LAB — TSH: TSH: 0.29 u[IU]/mL — ABNORMAL LOW (ref 0.35–4.50)

## 2017-09-08 LAB — T4, FREE: Free T4: 0.79 ng/dL (ref 0.60–1.60)

## 2017-09-08 LAB — POCT GLYCOSYLATED HEMOGLOBIN (HGB A1C): HEMOGLOBIN A1C: 7.1

## 2017-09-08 LAB — T3, FREE: T3, Free: 3.2 pg/mL (ref 2.3–4.2)

## 2017-09-08 MED ORDER — EXENATIDE 10 MCG/0.04ML ~~LOC~~ SOPN: 10.0000 ug | PEN_INJECTOR | Freq: Two times a day (BID) | SUBCUTANEOUS | 3 refills | Status: DC

## 2017-09-08 NOTE — Patient Instructions (Signed)
Byetta 10ug before meals 2x daily  Must do MORE SUGARS AFTER MEALS  Check blood sugars on waking up  3/7  Also check blood sugars about 2 hours after a meal and do this after different meals by rotation  Recommended blood sugar levels on waking up is 90-130 and about 2 hours after meal is 130-160  Please bring your blood sugar monitor to each visit, thank you   Walk daily

## 2017-09-08 NOTE — Progress Notes (Signed)
Patient ID: Brittney [name], female   DOB: Feb 10, 1946, 71 y.o.   MRN: [id number]   Reason for Appointment: Followup of diabetes  History of Present Illness    Type 2 DIABETES MELITUS, date of diagnosis: 1976      She has had long-standing diabetes taking insulin. Because of her difficulties with compliance using mealtime insulin she was given in Byetta in 2011 and subsequently Victoza in addition to her Lantus and this helped her control However she tends to be erratic with her day-to-day care and has not had adequate A1c levels in the past, usually over 7%  RECENT history:   Insulin regimen: Lantus 42 units daily in a.m., NovoLog 0-6 units at times   Non-insulin hypoglycemic drugs: Byetta 5 g before meals, Metformin   850 twice a day  Her A1c has been consistently around 8% and is now 7.8  Current glucose patterns and problems:  She has been reluctant to take mealtime insulin and is taking Byetta   This was increased to twice a day on her last visit since she was only taking it in the morning  This may be helping her overall control  As before she is checking mostly FASTING blood sugars and only rarely after meals  Also not clear if her evening readings around 6-7 are just before or after eating  She does have a variable diet and sometimes will have oatmeal and brown sugar for dinner  No nausea from Byetta twice a day  FASTING readings are overall fairly good with some variability and not clear why some readings are higher, last night had popcorn late at night  Her weight is about the same despite her saying that she is trying to walk fairly regularly    Side effects from medications:  Invokana:? Headache.  Victoza ?  Swelling in neck      Monitors blood glucose: 3 a day.    Glucometer: Accucheck         Blood Glucose readings from meter download:   Mean values apply above for all meters except median for One Touch  PRE-MEAL Fasting Lunch Dinner 6-7 PM   Overall  Glucose range: 70-209  109  116, 187  110, 210, 323    Mean/median: 126     140      Meals: 2-3 meals per day. . Dinner 7 pm, variable quantity Does have protein with breakfast consistently, has egg  Physical activity: exercise: walking recently           Wt Readings from Last 3 Encounters:  09/08/17 236 lb 6 oz (107.2 kg)  06/08/17 235 lb 6.4 oz (106.8 kg)  02/03/17 234 lb (106.1 kg)   GLYCEMIC CONTROL:  Lab Results  Component Value Date   HGBA1C 7.1 09/08/2017   HGBA1C 8.3 06/08/2017   HGBA1C 7.8 10/11/2016   Lab Results  Component Value Date   MICROALBUR <0.7 06/08/2017   [place] 82 06/08/2017   CREATININE 1.01 06/08/2017      Other problems discussed today: See review of systems   Allergies as of 09/08/2017      Reactions   Latex    Itching and breaking out      Medication List       Accurate as of 09/08/17  1:10 PM. Always use your most recent med list.          ACCU-CHEK AVIVA PLUS test strip Generic drug:  glucose blood USE TO TEST TWICE DAILY.  ACCU-CHEK AVIVA PLUS w/Device Kit Use to check blood sugar 2 times per day dx code E11.65   ALPRAZolam 0.5 MG tablet Commonly known as:  XANAX Take 0.5 mg by mouth at bedtime. Takes half a tab at bedtime   amLODipine 10 MG tablet Commonly known as:  NORVASC TAKE (1) TABLET BY MOUTH ONCE DAILY.   B-D ULTRAFINE III SHORT PEN 31G X 8 MM Misc Generic drug:  Insulin Pen Needle USE AS DIRECTED 2-3 TIMES A DAY.   BYETTA 5 MCG PEN 5 MCG/0.02ML Sopn injection Generic drug:  exenatide INJECT 5MCG INTO THE SKIN TWICE DAILY BEFORE MEALS.   gabapentin 100 MG capsule Commonly known as:  NEURONTIN Take 3 capsules (300 mg total) by mouth at bedtime.   HUMALOG KWIKPEN 100 UNIT/ML KiwkPen Generic drug:  insulin lispro INJECT 6 TO 7 UNITS SUBCUTANEOUSLY BEFORE SUPPER IF GLUCOSE IS HIGH.   ibuprofen 200 MG tablet Commonly known as:  ADVIL,MOTRIN Take 400 mg by mouth as needed.   LANTUS SOLOSTAR  100 UNIT/ML Solostar Pen Generic drug:  Insulin Glargine INJECT 44 UNITS SUBCUTANEOUSLY ONCE EVERY MORNING.   loratadine 10 MG tablet Commonly known as:  CLARITIN Take 10 mg by mouth daily as needed for allergies.   metFORMIN 850 MG tablet Commonly known as:  GLUCOPHAGE TAKE (1) TABLET BY MOUTH TWICE DAILY.   metoprolol tartrate 50 MG tablet Commonly known as:  LOPRESSOR TAKE 0.5 (ONE-HALF) TABLET BY MOUTH TWICE DAILY.   multivitamin tablet Take 1 tablet by mouth once a week.   nystatin-triamcinolone ointment Commonly known as:  MYCOLOG Apply 1 application topically 2 (two) times daily.   omeprazole 20 MG capsule Commonly known as:  PRILOSEC Take 20 mg by mouth daily.   onetouch ultrasoft lancets   polyethylene glycol powder powder Commonly known as:  GLYCOLAX/MIRALAX Take 1 Container by mouth as needed for moderate constipation.   potassium chloride 10 MEQ tablet Commonly known as:  K-DUR Take 10 mEq by mouth daily.   pravastatin 20 MG tablet Commonly known as:  PRAVACHOL TAKE ONE TABLET BY MOUTH DAILY.   torsemide 20 MG tablet Commonly known as:  DEMADEX TAKE 1 TABLET BY MOUTH ONCE DAILY AS NEEDED.   valsartan 320 MG tablet Commonly known as:  DIOVAN Take 320 mg by mouth daily.       Allergies:  Allergies  Allergen Reactions  . Latex     Itching and breaking out    Past Medical History:  Diagnosis Date  . Atrial fibrillation (HCC)    Paroxysmal; LVH; nl EF; onset in 1999  . Burning with urination 02/23/2016  . Diabetes mellitus    insulin; managed by Dr. Dwyane Dee  . 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 Hyperlipidemia   . Hypertension   . Hypothyroidism    history of goiter; nl TSH off medication  . Itching with irritation 02/27/2015  . Obesity 07/06/2012  . Palpitations    negative event recorder in 2009  . UTI (urinary tract infection) 02/23/2016    Past Surgical History:  Procedure Laterality Date  . BIOPSY  THYROID    . CESAREAN SECTION    . CHOLECYSTECTOMY  11/95  . COLONOSCOPY  2007  . RETINAL DETACHMENT SURGERY  2009  . TUBAL LIGATION  1980's    Family History  Problem Relation Age of Onset  . Diabetes Mother   . Diabetes Daughter        gestational diabetes  . Cancer Father   .  Thyroid disease Brother   . Other Son        MVA    Social History:  reports that she quit smoking about 31 years ago. Her smoking use included Cigarettes. She started smoking about 50 years ago. She smoked 1.00 pack per day. She has never used smokeless tobacco. She reports that she does not drink alcohol or use drugs.  Review of Systems:   HYPERTENSION:  Followed by PCP  She still has occasional palpitations but has not seen a cardiologist in a year now  HYPERLIPIDEMIA: The lipid abnormality consists of elevated LDL; she has been given Pravachol Last lipid panel was excellent with her staying on Pravachol   Lab Results  Component Value Date   CHOL 162 06/08/2017   HDL 63.50 06/08/2017   LDLCALC 82 06/08/2017   TRIG 86.0 06/08/2017   CHOLHDL 3 06/08/2017    Multinodular goiter: She has had a long-standing multinodular goiter since at least 2005 which has been fairly stable clinically She does have history of occasional palpitations for years but has been treated with metoprolol by her cardiologist for this No local pressure symptoms of choking or difficulty swallowing  Her goiter has been persistently autonomous.   No evidence of overt hyperthyroidism  with normal free T4 and free T3 levels; TSH has been consistently suppressed  Her I-131 uptake in 2003 was 17% and her  thyroid ultrasound in 2014 did not show distinct nodules but a relative increase in size  Lab Results  Component Value Date   TSH 0.02 (L) 06/08/2017   TSH 0.12 (L) 10/11/2016   FREET4 0.74 06/08/2017   FREET4 1.01 10/11/2016   Lab Results  Component Value Date   T3FREE 3.0 06/08/2017   T3FREE 3.3 10/11/2016    T3FREE 3.3 05/09/2016       Examination:   BP (!) 150/80 (BP Location: Right Arm, Patient Position: Sitting, Cuff Size: Normal)   Pulse 72   Ht _0  (1.702 m)   Wt 236 lb 6 oz (107.2 kg)   SpO2 97%   BMI 37.02 kg/m   Body mass index is 37.02 kg/m.     ASSESSMENT/ PLAN:  Diabetes Type 2   See history of present illness for detailed discussion of  current management, blood sugar patterns and problems identified  Blood sugars are still not well controlled and A1c is 7.8 although improved She has persistently refused to take Novolog at mealtimes because of fear of hypoglycemia and she only takes it when blood sugars are high Her Lantus insulin usually appears to be controlling her fasting readings fairly well without hypoglycemia overnight Some fasting readings may be high because variable diet with evening meal or bedtime snacks  Recommendations:  Explained to her the importance of checking blood sugars after meals and only at least every other day in the morning She may need Novolog only if blood sugars are consistently high after meals Start taking 10 g of Byetta instead of 5, this may help her postprandial readings also She will have balanced meals without excess of carbohydrates and also watch her snacks Consider consultation with dietitian Discussed postprandial targets of at least under 180  Hypercholesterolemia: Lipids Need to be rechecked periodically, she is still taking pravastatin   MULTINODULAR goiter: Need to reassess her thyroid levels again for her autonomous thyroid, discussed that it is unlikely that she is getting palpitations from this  Counseling time on subjects discussed in assessment and plan sections is over 50% of  today's 25 minute visit  Patient Instructions  Byetta 10ug before meals 2x daily  Must do MORE SUGARS AFTER MEALS  Check blood sugars on waking up  3/7  Also check blood sugars about 2 hours after a meal and do this after different  meals by rotation  Recommended blood sugar levels on waking up is 90-130 and about 2 hours after meal is 130-160  Please bring your blood sugar monitor to each visit, thank you   Walk daily      Nathaly Dawkins 09/08/2017, 1:10 PM

## 2017-09-09 LAB — FRUCTOSAMINE: Fructosamine: 342 umol/L — ABNORMAL HIGH (ref 0–285)

## 2017-09-11 NOTE — Progress Notes (Signed)
Please call to let patient know that the lab results are showing good thyroid levels but overall higher sugars, blood sugar 175 Needs to do more blood sugars after meals as discussed

## 2017-09-18 ENCOUNTER — Other Ambulatory Visit: Payer: Self-pay | Admitting: Endocrinology

## 2017-10-02 ENCOUNTER — Encounter (INDEPENDENT_AMBULATORY_CARE_PROVIDER_SITE_OTHER): Payer: Medicare Other | Admitting: Ophthalmology

## 2017-10-17 DIAGNOSIS — I739 Peripheral vascular disease, unspecified: Secondary | ICD-10-CM | POA: Diagnosis not present

## 2017-10-17 DIAGNOSIS — E1051 Type 1 diabetes mellitus with diabetic peripheral angiopathy without gangrene: Secondary | ICD-10-CM | POA: Diagnosis not present

## 2017-10-18 DIAGNOSIS — E118 Type 2 diabetes mellitus with unspecified complications: Secondary | ICD-10-CM | POA: Diagnosis not present

## 2017-10-18 DIAGNOSIS — E785 Hyperlipidemia, unspecified: Secondary | ICD-10-CM | POA: Diagnosis not present

## 2017-10-18 DIAGNOSIS — I10 Essential (primary) hypertension: Secondary | ICD-10-CM | POA: Diagnosis not present

## 2017-10-18 DIAGNOSIS — G4700 Insomnia, unspecified: Secondary | ICD-10-CM | POA: Diagnosis not present

## 2017-10-27 ENCOUNTER — Encounter (INDEPENDENT_AMBULATORY_CARE_PROVIDER_SITE_OTHER): Payer: Medicare Other | Admitting: Ophthalmology

## 2017-10-27 DIAGNOSIS — E103593 Type 1 diabetes mellitus with proliferative diabetic retinopathy without macular edema, bilateral: Secondary | ICD-10-CM | POA: Diagnosis not present

## 2017-10-27 DIAGNOSIS — H43813 Vitreous degeneration, bilateral: Secondary | ICD-10-CM | POA: Diagnosis not present

## 2017-10-27 DIAGNOSIS — E10319 Type 1 diabetes mellitus with unspecified diabetic retinopathy without macular edema: Secondary | ICD-10-CM | POA: Diagnosis not present

## 2017-10-27 DIAGNOSIS — I10 Essential (primary) hypertension: Secondary | ICD-10-CM | POA: Diagnosis not present

## 2017-10-27 DIAGNOSIS — H35373 Puckering of macula, bilateral: Secondary | ICD-10-CM | POA: Diagnosis not present

## 2017-10-27 DIAGNOSIS — H35033 Hypertensive retinopathy, bilateral: Secondary | ICD-10-CM | POA: Diagnosis not present

## 2017-10-27 LAB — HM DIABETES EYE EXAM

## 2017-11-15 ENCOUNTER — Telehealth: Payer: Self-pay

## 2017-11-15 NOTE — Telephone Encounter (Signed)
Patient called and she stated that when she gave her injection of Byetta she noticed her veins in her left arm seemed to protrude more than normal. I asked if she was hydrated and she stated that she was not drinking as much water as she normally did but she wanted to ask [place] just in case. She does have an appointment in January with [place].  Please advise if there is anything that needs to be advised to the patient.

## 2017-11-17 ENCOUNTER — Other Ambulatory Visit: Payer: Self-pay | Admitting: Endocrinology

## 2017-11-17 ENCOUNTER — Other Ambulatory Visit: Payer: Self-pay | Admitting: Cardiovascular Disease

## 2017-11-17 NOTE — Telephone Encounter (Signed)
Brittney [name] has nothing to do with her arm veins.  She is supposed to be taking this in the same injection sites as insulin

## 2017-11-23 NOTE — Telephone Encounter (Signed)
I contacted the patient and advised of message via voicemail. Requested a call back if the patient would like to discuss further.  

## 2017-11-30 ENCOUNTER — Other Ambulatory Visit: Payer: Self-pay | Admitting: Endocrinology

## 2017-12-11 ENCOUNTER — Ambulatory Visit: Payer: Medicare Other | Admitting: Endocrinology

## 2017-12-11 ENCOUNTER — Other Ambulatory Visit: Payer: Self-pay | Admitting: Cardiovascular Disease

## 2017-12-11 ENCOUNTER — Other Ambulatory Visit: Payer: Self-pay | Admitting: Endocrinology

## 2017-12-27 ENCOUNTER — Ambulatory Visit (INDEPENDENT_AMBULATORY_CARE_PROVIDER_SITE_OTHER): Payer: Medicare Other | Admitting: Endocrinology

## 2017-12-27 VITALS — BP 122/64 | HR 81 | Ht 67.0 in | Wt 231.2 lb

## 2017-12-27 DIAGNOSIS — I10 Essential (primary) hypertension: Secondary | ICD-10-CM

## 2017-12-27 DIAGNOSIS — E1165 Type 2 diabetes mellitus with hyperglycemia: Secondary | ICD-10-CM

## 2017-12-27 DIAGNOSIS — E7800 Pure hypercholesterolemia, unspecified: Secondary | ICD-10-CM | POA: Diagnosis not present

## 2017-12-27 DIAGNOSIS — Z794 Long term (current) use of insulin: Secondary | ICD-10-CM | POA: Diagnosis not present

## 2017-12-27 LAB — COMPREHENSIVE METABOLIC PANEL
ALBUMIN: 3.9 g/dL (ref 3.5–5.2)
ALK PHOS: 70 U/L (ref 39–117)
ALT: 17 U/L (ref 0–35)
AST: 24 U/L (ref 0–37)
BILIRUBIN TOTAL: 0.6 mg/dL (ref 0.2–1.2)
BUN: 23 mg/dL (ref 6–23)
CALCIUM: 9.3 mg/dL (ref 8.4–10.5)
CO2: 28 mEq/L (ref 19–32)
Chloride: 102 mEq/L (ref 96–112)
Creatinine, Ser: 1.38 mg/dL — ABNORMAL HIGH (ref 0.40–1.20)
GFR: 48.52 mL/min — AB (ref >60.00)
Glucose, Bld: 155 mg/dL — ABNORMAL HIGH (ref 70–99)
Potassium: 4.5 mEq/L (ref 3.5–5.1)
Sodium: 134 mEq/L — ABNORMAL LOW (ref 135–145)
TOTAL PROTEIN: 7.4 g/dL (ref 6.0–8.3)

## 2017-12-27 LAB — LIPID PANEL
Cholesterol: 165 mg/dL (ref 0–200)
HDL: 60.4 mg/dL (ref >39.00)
LDL Cholesterol: 92 mg/dL (ref 0–99)
NonHDL: 104.59
TRIGLYCERIDES: 65 mg/dL (ref 0.0–149.0)
Total CHOL/HDL Ratio: 3
VLDL: 13 mg/dL (ref 0.0–40.0)

## 2017-12-27 LAB — POCT GLYCOSYLATED HEMOGLOBIN (HGB A1C): HEMOGLOBIN A1C: 7.3

## 2017-12-27 MED ORDER — EXENATIDE 10 MCG/0.04ML ~~LOC~~ SOPN: 10.0000 ug | PEN_INJECTOR | Freq: Two times a day (BID) | SUBCUTANEOUS | 3 refills | Status: DC

## 2017-12-27 NOTE — Patient Instructions (Signed)
Take Byetta 30 min before Bfst and Supper  If am Sugar < 100 then cut Lantus

## 2017-12-27 NOTE — Progress Notes (Signed)
Patient ID: Brittney [name], female   DOB: 1946-11-03, 72 y.o.   MRN: [id number]   Reason for Appointment: Followup of diabetes  History of Present Illness    Type 2 DIABETES MELITUS, date of diagnosis: 1976      She has had long-standing diabetes taking insulin. Because of her difficulties with compliance using mealtime insulin she was given in Byetta in 2011 and subsequently Victoza in addition to her Lantus and this helped her control However she tends to be erratic with her day-to-day care and has not had adequate A1c levels in the past, usually over 7%  RECENT history:   Insulin regimen: Lantus 42 units daily in a.m., NovoLog 0-6 units at dinner   Non-insulin hypoglycemic drugs: Not taking Byetta 10 g before meals, Metformin   850 twice a day  Her A1c had come down to 7.1 on her last visit from 8.3 and is still about the same at 7.3  Current glucose patterns and problems:  She has stopped taking Byetta and sometime after her last visit and she thinks this is because it made her veins in the arm swell up  However she still has managed to lose some weight since her last visit with probably some portion control  She has erratically high blood sugars at various times including overnight and she is likely not watching her diet carbohydrates  She does take NovoLog but unclear when she is taking it, she thinks she is taking are only when the blood sugar goes up significantly high around 200 or so  However are usually not checking blood sugars before supper  That sugars are also low normal at times 60s and this is mostly around lunchtime  She does try to walk but intermittently  Fasting blood sugars are reasonably good although occasionally high also    Side effects from medications:  Invokana:? Headache.  Victoza ?  Swelling in neck      Monitors blood glucose: 3 a day.    Glucometer: Accucheck         Blood Glucose readings from meter download:   Mean values  apply above for all meters except median for One Touch  PRE-MEAL Fasting Lunch Dinner Overnight  Overall  Glucose range: 98-178  66-174   111-271    Mean/median: 147    156  128 +/-39    POST-MEAL PC Breakfast PC Lunch PC Dinner  Glucose range:   116-207   Mean/median:      Previously readings:  Mean values apply above for all meters except median for One Touch  PRE-MEAL Fasting Lunch Dinner 6-7 PM  Overall  Glucose range: 70-209  109  116, 187  110, 210, 323    Mean/median: 126     140      Meals: 2-3 meals per day. . Dinner 7 pm, variable quantity Does have protein with breakfast consistently, has egg  Physical activity: exercise: walking some          Wt Readings from Last 3 Encounters:  12/27/17 231 lb 3.2 oz (104.9 kg)  09/08/17 236 lb 6 oz (107.2 kg)  06/08/17 235 lb 6.4 oz (106.8 kg)   GLYCEMIC CONTROL:  Lab Results  Component Value Date   HGBA1C 7.1 09/08/2017   HGBA1C 8.3 06/08/2017   HGBA1C 7.8 10/11/2016   Lab Results  Component Value Date   MICROALBUR <0.7 06/08/2017   LDLCALC 82 06/08/2017   CREATININE 1.01 09/08/2017      Other  problems discussed today: See review of systems   Allergies as of 12/27/2017      Reactions   Latex    Itching and breaking out      Medication List        Accurate as of 12/27/17  8:44 AM. Always use your most recent med list.          ACCU-CHEK AVIVA PLUS test strip Generic drug:  glucose blood USE TO TEST TWICE DAILY.   ACCU-CHEK AVIVA PLUS w/Device Kit Use to check blood sugar 2 times per day dx code E11.65   ALPRAZolam 0.5 MG tablet Commonly known as:  XANAX Take 0.5 mg by mouth at bedtime. Takes half a tab at bedtime   amLODipine 10 MG tablet Commonly known as:  NORVASC TAKE (1) TABLET BY MOUTH ONCE DAILY.   B-D ULTRAFINE III SHORT PEN 31G X 8 MM Misc Generic drug:  Insulin Pen Needle USE AS DIRECTED 2-3 TIMES A DAY.   exenatide 10 MCG/0.04ML Sopn injection Commonly known as:  BYETTA 10 MCG  PEN Inject 0.04 mLs (10 mcg total) into the skin 2 (two) times daily with a meal.   gabapentin 100 MG capsule Commonly known as:  NEURONTIN TAKE 3 CAPSULES BY MOUTH AT BEDTIME.   HUMALOG KWIKPEN 100 UNIT/ML KiwkPen Generic drug:  insulin lispro INJECT 6 TO 7 UNITS SUBCUTANEOUSLY BEFORE SUPPER IF GLUCOSE IS HIGH.   ibuprofen 200 MG tablet Commonly known as:  ADVIL,MOTRIN Take 400 mg by mouth as needed.   LANTUS SOLOSTAR 100 UNIT/ML Solostar Pen Generic drug:  Insulin Glargine INJECT 44 UNITS SUBCUTANEOUSLY ONCE EVERY MORNING.   loratadine 10 MG tablet Commonly known as:  CLARITIN Take 10 mg by mouth daily as needed for allergies.   metFORMIN 850 MG tablet Commonly known as:  GLUCOPHAGE TAKE (1) TABLET BY MOUTH TWICE DAILY.   metoprolol tartrate 50 MG tablet Commonly known as:  LOPRESSOR TAKE 0.5 (ONE-HALF) TABLET BY MOUTH TWICE DAILY.   multivitamin tablet Take 1 tablet by mouth once a week.   nystatin-triamcinolone ointment Commonly known as:  MYCOLOG Apply 1 application topically 2 (two) times daily.   omeprazole 20 MG capsule Commonly known as:  PRILOSEC Take 20 mg by mouth daily.   onetouch ultrasoft lancets   polyethylene glycol powder powder Commonly known as:  GLYCOLAX/MIRALAX Take 1 Container by mouth as needed for moderate constipation.   potassium chloride 10 MEQ tablet Commonly known as:  K-DUR Take 10 mEq by mouth daily.   pravastatin 20 MG tablet Commonly known as:  PRAVACHOL TAKE ONE TABLET BY MOUTH DAILY.   torsemide 20 MG tablet Commonly known as:  DEMADEX TAKE 1 TABLET BY MOUTH ONCE DAILY AS NEEDED.   valsartan 320 MG tablet Commonly known as:  DIOVAN Take 320 mg by mouth daily.       Allergies:  Allergies  Allergen Reactions  . Latex     Itching and breaking out    Past Medical History:  Diagnosis Date  . Atrial fibrillation (HCC)    Paroxysmal; LVH; nl EF; onset in 1999  . Burning with urination 02/23/2016  . Diabetes  mellitus    insulin; managed by Dr. Dwyane Dee  . 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 Hyperlipidemia   . Hypertension   . Hypothyroidism    history of goiter; nl TSH off medication  . Itching with irritation 02/27/2015  . Obesity 07/06/2012  . Palpitations    negative event  recorder in 2009  . UTI (urinary tract infection) 02/23/2016    Past Surgical History:  Procedure Laterality Date  . BIOPSY THYROID    . CESAREAN SECTION    . CHOLECYSTECTOMY  11/95  . COLONOSCOPY  2007  . RETINAL DETACHMENT SURGERY  2009  . TUBAL LIGATION  1980's    Family History  Problem Relation Age of Onset  . Diabetes Mother   . Diabetes Daughter        gestational diabetes  . Cancer Father   . Thyroid disease Brother   . Other Son        MVA    Social History:  reports that she quit smoking about 31 years ago. Her smoking use included cigarettes. She started smoking about 51 years ago. She smoked 1.00 pack per day. she has never used smokeless tobacco. She reports that she does not drink alcohol or use drugs.  Review of Systems:   HYPERTENSION:  Followed by PCP, now and Benicar instead of valsartan which was record   HYPERLIPIDEMIA: The lipid abnormality consists of elevated LDL; she has been given Pravachol Needs follow-up  Lab Results  Component Value Date   CHOL 162 06/08/2017   HDL 63.50 06/08/2017   LDLCALC 82 06/08/2017   TRIG 86.0 06/08/2017   CHOLHDL 3 06/08/2017    MULTINODULAR GOITER: She has had a long-standing multinodular goiter since at least 2005 which has been fairly stable clinically She does have history of occasional palpitations for years but has been treated with metoprolol by her cardiologist for this No local pressure symptoms of choking or difficulty swallowing  Her goiter has been persistently autonomous.   No evidence of overt hyperthyroidism  with normal free T4 and free T3 levels; TSH has been consistently suppressed  Her I-131  uptake in 2003 was 17% and her  thyroid ultrasound in 2014 did not show distinct nodules but a relative increase in size  Lab Results  Component Value Date   TSH 0.29 (L) 09/08/2017   TSH 0.02 (L) 06/08/2017   FREET4 0.79 09/08/2017   FREET4 0.74 06/08/2017   Lab Results  Component Value Date   T3FREE 3.2 09/08/2017   T3FREE 3.0 06/08/2017   T3FREE 3.3 10/11/2016       Examination:   BP 122/64 (BP Location: Left Arm, Patient Position: Sitting, Cuff Size: Large)   Pulse 81   Ht 5' 7"  (1.702 m)   Wt 231 lb 3.2 oz (104.9 kg)   SpO2 96%   BMI 36.21 kg/m   Body mass index is 36.21 kg/m.     ASSESSMENT/ PLAN:  Diabetes Type 2 on insulin  See history of present illness for detailed discussion of  current management, blood sugar patterns and problems identified  Blood sugars are variable at home although her A1c is still reasonably good at 7.3 She is only taking basal insulin and metformin and only occasionally will take NovoLog as needed for high sugars She had initially started doing well on Byetta with improved A1c but she stopped this because of unrelated symptom  Since she is not proactively able to take NovoLog for higher carbohydrate or higher calorie meals will do better with Byetta and this was discussed Explained to her that Byetta does not cause swelling of the veins of her hands She does also have some erratic meals and snacks with sometimes readings above 150 overnight Also sporadically has high readings later in the day  Recommendations:  Consistent diet and avoid snacking  Take Byetta consistently before breakfast and supper, 30 minutes before eating May not need NovoLog if she takes Byetta and watches her diet She may need to reduce her Lantus if fasting readings start getting lower To check more blood sugars after evening meal More regular walking  Consider consultation with dietitian Discussed postprandial targets of at least under  180  Hypercholesterolemia: Lipids Need to be rechecked   Hypertension: Blood pressure is well-controlled  Counseling time on subjects discussed in assessment and plan sections is over 50% of today's 25 minute visit  There are no Patient Instructions on file for this visit.    Elayne Snare 12/27/2017, 8:44 AM

## 2017-12-28 NOTE — Progress Notes (Signed)
Please call to let patient know that the lab results are good although continued to slightly higher, she needs to discuss with PCP, please route to Dr. Berdine Addison

## 2018-01-18 ENCOUNTER — Other Ambulatory Visit: Payer: Self-pay | Admitting: Endocrinology

## 2018-01-19 ENCOUNTER — Other Ambulatory Visit: Payer: Self-pay

## 2018-01-19 MED ORDER — BAYER MICROLET LANCETS MISC: 3 refills | Status: DC

## 2018-01-24 DIAGNOSIS — I10 Essential (primary) hypertension: Secondary | ICD-10-CM | POA: Diagnosis not present

## 2018-01-24 DIAGNOSIS — E118 Type 2 diabetes mellitus with unspecified complications: Secondary | ICD-10-CM | POA: Diagnosis not present

## 2018-02-06 DIAGNOSIS — E1051 Type 1 diabetes mellitus with diabetic peripheral angiopathy without gangrene: Secondary | ICD-10-CM | POA: Diagnosis not present

## 2018-02-06 DIAGNOSIS — I739 Peripheral vascular disease, unspecified: Secondary | ICD-10-CM | POA: Diagnosis not present

## 2018-02-09 ENCOUNTER — Other Ambulatory Visit: Payer: Self-pay | Admitting: Endocrinology

## 2018-02-20 ENCOUNTER — Encounter: Payer: Self-pay | Admitting: Cardiovascular Disease

## 2018-02-20 ENCOUNTER — Ambulatory Visit (INDEPENDENT_AMBULATORY_CARE_PROVIDER_SITE_OTHER): Payer: Medicare Other | Admitting: Cardiovascular Disease

## 2018-02-20 VITALS — BP 133/72 | HR 67 | Ht 67.0 in | Wt 236.0 lb

## 2018-02-20 DIAGNOSIS — I480 Paroxysmal atrial fibrillation: Secondary | ICD-10-CM

## 2018-02-20 DIAGNOSIS — E7800 Pure hypercholesterolemia, unspecified: Secondary | ICD-10-CM | POA: Diagnosis not present

## 2018-02-20 DIAGNOSIS — I10 Essential (primary) hypertension: Secondary | ICD-10-CM

## 2018-02-20 NOTE — Progress Notes (Signed)
SUBJECTIVE: The patient presents for follow-up for history of palpitations and paroxysmal atrial fibrillation.  She has a very remote history of PAF, and thus has never been on anticoagulation.  The patient denies any symptoms of chest pain, palpitations, shortness of breath, lightheadedness, dizziness, leg swelling, orthopnea, PND, and syncope.  She has not had any recent hospitalizations.  ECG performed in the office today which I ordered and personally interpreted demonstrates normal sinus rhythm with no ischemic ST segment or T-wave abnormalities, nor any arrhythmias.     Review of Systems: As per "subjective", otherwise negative.  Allergies  Allergen Reactions  . Latex     Itching and breaking out    Current Outpatient Medications  Medication Sig Dispense Refill  . ACCU-CHEK AVIVA PLUS test strip USE TO TEST TWICE DAILY. 100 each 5  . ALPRAZolam (XANAX) 0.5 MG tablet Take 0.5 mg by mouth at bedtime. Takes half a tab at bedtime    . amLODipine (NORVASC) 10 MG tablet TAKE (1) TABLET BY MOUTH ONCE DAILY. 30 tablet 11  . B-D ULTRAFINE III SHORT PEN 31G X 8 MM MISC USE AS DIRECTED 2-3 TIMES A DAY. 100 each 11  . BAYER MICROLET LANCETS lancets Use as instructed to check blood sugar 3 times daily. Dx Code E11.65 100 each 3  . Blood Glucose Monitoring Suppl (ACCU-CHEK AVIVA PLUS) W/DEVICE KIT Use to check blood sugar 2 times per day dx code E11.65 1 kit 0  . exenatide (BYETTA 10 MCG PEN) 10 MCG/0.04ML SOPN injection Inject 0.04 mLs (10 mcg total) into the skin 2 (two) times daily with a meal. 1 pen 3  . gabapentin (NEURONTIN) 100 MG capsule TAKE 3 CAPSULES BY MOUTH AT BEDTIME. 90 capsule 0  . HUMALOG KWIKPEN 100 UNIT/ML KiwkPen INJECT 6 TO 7 UNITS SUBCUTANEOUSLY BEFORE SUPPER IF GLUCOSE IS HIGH. 15 mL 0  . ibuprofen (ADVIL,MOTRIN) 200 MG tablet Take 400 mg by mouth as needed.    . Lancets (ONETOUCH ULTRASOFT) lancets     . LANTUS SOLOSTAR 100 UNIT/ML Solostar Pen INJECT 44  UNITS SUBCUTANEOUSLY ONCE EVERY MORNING. 15 mL 2  . loratadine (CLARITIN) 10 MG tablet Take 10 mg by mouth daily as needed for allergies.    . metFORMIN (GLUCOPHAGE) 850 MG tablet TAKE (1) TABLET BY MOUTH TWICE DAILY. 60 tablet 0  . metoprolol tartrate (LOPRESSOR) 50 MG tablet TAKE 0.5 (ONE-HALF) TABLET BY MOUTH TWICE DAILY. 90 tablet 3  . Multiple Vitamin (MULTIVITAMIN) tablet Take 1 tablet by mouth once a week.     . nystatin-triamcinolone ointment (MYCOLOG) Apply 1 application topically 2 (two) times daily. 30 g 2  . olmesartan (BENICAR) 40 MG tablet     . omeprazole (PRILOSEC) 20 MG capsule Take 20 mg by mouth daily.      . polyethylene glycol powder (GLYCOLAX/MIRALAX) powder Take 1 Container by mouth as needed for moderate constipation.     . potassium chloride (K-DUR) 10 MEQ tablet Take 10 mEq by mouth daily.     . pravastatin (PRAVACHOL) 20 MG tablet TAKE ONE TABLET BY MOUTH DAILY. 30 tablet 3  . torsemide (DEMADEX) 20 MG tablet TAKE 1 TABLET BY MOUTH ONCE DAILY AS NEEDED. 30 tablet 9   No current facility-administered medications for this visit.     Past Medical History:  Diagnosis Date  . Atrial fibrillation (HCC)    Paroxysmal; LVH; nl EF; onset in 1999  . Burning with urination 02/23/2016  . Diabetes mellitus  insulin; managed by Dr. Dwyane Dee  . 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 Hyperlipidemia   . Hypertension   . Hypothyroidism    history of goiter; nl TSH off medication  . Itching with irritation 02/27/2015  . Obesity 07/06/2012  . Palpitations    negative event recorder in 2009  . UTI (urinary tract infection) 02/23/2016    Past Surgical History:  Procedure Laterality Date  . BIOPSY THYROID    . CESAREAN SECTION    . CHOLECYSTECTOMY  11/95  . COLONOSCOPY  2007  . RETINAL DETACHMENT SURGERY  2009  . TUBAL LIGATION  1980's    Social History   Socioeconomic History  . Marital status: Married    Spouse name: Not on file  . Number of  children: Not on file  . Years of education: Not on file  . Highest education level: Not on file  Occupational History  . Occupation: Retired  Scientific laboratory technician  . Financial resource strain: Not on file  . Food insecurity:    Worry: Not on file    Inability: Not on file  . Transportation needs:    Medical: Not on file    Non-medical: Not on file  Tobacco Use  . Smoking status: Former Smoker    Packs/day: 1.00    Types: Cigarettes    Start date: 11/21/1966    Last attempt to quit: 08/13/1986    Years since quitting: 31.5  . Smokeless tobacco: Never Used  Substance and Sexual Activity  . Alcohol use: No    Alcohol/week: 0.0 oz  . Drug use: No  . Sexual activity: Never    Birth control/protection: Post-menopausal  Lifestyle  . Physical activity:    Days per week: Not on file    Minutes per session: Not on file  . Stress: Not on file  Relationships  . Social connections:    Talks on phone: Not on file    Gets together: Not on file    Attends religious service: Not on file    Active member of club or organization: Not on file    Attends meetings of clubs or organizations: Not on file    Relationship status: Not on file  . Intimate partner violence:    Fear of current or ex partner: Not on file    Emotionally abused: Not on file    Physically abused: Not on file    Forced sexual activity: Not on file  Other Topics Concern  . Not on file  Social History Narrative   No regular exercise     Vitals:   02/20/18 0941  BP: 133/72  Pulse: 67  SpO2: 98%  Weight: 236 lb (107 kg)  Height: _0  (1.702 m)    Wt Readings from Last 3 Encounters:  02/20/18 236 lb (107 kg)  12/27/17 231 lb 3.2 oz (104.9 kg)  09/08/17 236 lb 6 oz (107.2 kg)     PHYSICAL EXAM General: NAD HEENT: Normal. Neck: No JVD, no thyromegaly. Lungs: Clear to auscultation bilaterally with normal respiratory effort. CV: Regular rate and rhythm, normal S1/S2, no S3/S4, no murmur. No pretibial or periankle  edema.  No carotid bruit.   Abdomen: Soft, nontender, no distention.  Neurologic: Alert and oriented.  Psych: Normal affect. Skin: Normal. Musculoskeletal: No gross deformities.    ECG: Most recent ECG reviewed.   Labs: Lab Results  Component Value Date/Time   K 4.5 12/27/2017 09:15 AM   BUN  23 12/27/2017 09:15 AM   CREATININE 1.38 (H) 12/27/2017 09:15 AM   CREATININE 0.99 05/27/2014 10:55 AM   ALT 17 12/27/2017 09:15 AM   TSH 0.29 (L) 09/08/2017 10:10 AM   HGB 10.7 (L) 05/09/2015 01:42 PM     Lipids: Lab Results  Component Value Date/Time   LDLCALC 92 12/27/2017 09:15 AM   CHOL 165 12/27/2017 09:15 AM   TRIG 65.0 12/27/2017 09:15 AM   HDL 60.40 12/27/2017 09:15 AM       ASSESSMENT AND PLAN: 1. Palpitations with a h/o paroxysmal atrial fibrillation:  Symptomatically stable for several years now.  No arrhythmias by event monitoring. Normal cardiac function by echo. She has a very remote history, and thus has never been on anticoagulation.  Continue metoprolol. If she were to have documented recurrences, given her history of HTN and diabetes, she would warrant anticoagulation.  2. Essential HTN: Controlled. No changes.  3. Hyperlipidemia: Lipid panel on 12/27/17 demonstrated total cholesterol 165, triglycerides 65, HDL 60, LDL 92. On pravastatin 40 mg.      Disposition: Follow up as needed   Kate Sable, M.D., F.A.C.C.

## 2018-02-20 NOTE — Patient Instructions (Signed)
Your physician recommends that you schedule a follow-up appointment in:  As needed with Dr.Koneswaran   No change in medications    No tests ordered today.      Thank you for choosing [facility] !

## 2018-03-23 ENCOUNTER — Telehealth: Payer: Self-pay | Admitting: Family Medicine

## 2018-03-23 NOTE — Telephone Encounter (Signed)
Best number [phone] Pt called  She forgot to take her insulin She wants to know if she can take full amount now?

## 2018-03-26 ENCOUNTER — Ambulatory Visit (INDEPENDENT_AMBULATORY_CARE_PROVIDER_SITE_OTHER): Payer: Medicare Other | Admitting: Endocrinology

## 2018-03-26 ENCOUNTER — Encounter: Payer: Self-pay | Admitting: Endocrinology

## 2018-03-26 VITALS — BP 140/62 | HR 75 | Ht 67.0 in | Wt 234.8 lb

## 2018-03-26 DIAGNOSIS — I10 Essential (primary) hypertension: Secondary | ICD-10-CM | POA: Diagnosis not present

## 2018-03-26 DIAGNOSIS — E782 Mixed hyperlipidemia: Secondary | ICD-10-CM | POA: Diagnosis not present

## 2018-03-26 DIAGNOSIS — E1165 Type 2 diabetes mellitus with hyperglycemia: Secondary | ICD-10-CM

## 2018-03-26 DIAGNOSIS — E0590 Thyrotoxicosis, unspecified without thyrotoxic crisis or storm: Secondary | ICD-10-CM

## 2018-03-26 DIAGNOSIS — Z794 Long term (current) use of insulin: Secondary | ICD-10-CM | POA: Diagnosis not present

## 2018-03-26 LAB — URINALYSIS, ROUTINE W REFLEX MICROSCOPIC
Bilirubin Urine: NEGATIVE
KETONES UR: NEGATIVE
Nitrite: POSITIVE — AB
SPECIFIC GRAVITY, URINE: 1.02 (ref 1.000–1.030)
Total Protein, Urine: NEGATIVE
URINE GLUCOSE: NEGATIVE
UROBILINOGEN UA: 0.2 (ref 0.0–1.0)
pH: 6 (ref 5.0–8.0)

## 2018-03-26 LAB — TSH: TSH: 0.1 u[IU]/mL — ABNORMAL LOW (ref 0.35–4.50)

## 2018-03-26 LAB — COMPREHENSIVE METABOLIC PANEL
ALT: 10 U/L (ref 0–35)
AST: 13 U/L (ref 0–37)
Albumin: 4.1 g/dL (ref 3.5–5.2)
Alkaline Phosphatase: 84 U/L (ref 39–117)
BUN: 18 mg/dL (ref 6–23)
CHLORIDE: 104 meq/L (ref 96–112)
CO2: 26 meq/L (ref 19–32)
CREATININE: 1.04 mg/dL (ref 0.40–1.20)
Calcium: 9.7 mg/dL (ref 8.4–10.5)
GFR: 67.2 mL/min (ref >60.00)
Glucose, Bld: 173 mg/dL — ABNORMAL HIGH (ref 70–99)
POTASSIUM: 4.4 meq/L (ref 3.5–5.1)
SODIUM: 139 meq/L (ref 135–145)
Total Bilirubin: 0.3 mg/dL (ref 0.2–1.2)
Total Protein: 7.7 g/dL (ref 6.0–8.3)

## 2018-03-26 LAB — T4, FREE: FREE T4: 0.78 ng/dL (ref 0.60–1.60)

## 2018-03-26 LAB — MICROALBUMIN / CREATININE URINE RATIO
CREATININE, U: 106.7 mg/dL
MICROALB UR: 2 mg/dL — AB (ref 0.0–1.9)
Microalb Creat Ratio: 1.9 mg/g (ref 0.0–30.0)

## 2018-03-26 LAB — T3, FREE: T3 FREE: 3 pg/mL (ref 2.3–4.2)

## 2018-03-26 LAB — POCT GLYCOSYLATED HEMOGLOBIN (HGB A1C): Hemoglobin A1C: 7.5

## 2018-03-26 NOTE — Progress Notes (Signed)
Patient ID: Brittney [name], female   DOB: [birth year], 72 y.o.   MRN: [id number]   Reason for Appointment: Followup of diabetes  History of Present Illness    Type 2 DIABETES MELITUS, date of diagnosis: 1976      She has had long-standing diabetes taking insulin. Because of her difficulties with compliance using mealtime insulin she was given in Byetta in 2011 and subsequently Victoza in addition to her Lantus and this helped her control However she tends to be erratic with her day-to-day care and has not had adequate A1c levels in the past, usually over 7%  RECENT history:   Insulin regimen: Lantus 42 units daily in a.m., NovoLog 0-6 units at dinner   Non-insulin hypoglycemic drugs: not taking Byetta 10 g before meals, Metformin   850 twice a day  Her A1c is gradually increasing, now 7.5 and has been as low as 7.1   Current glucose patterns and problems:  She has stopped taking Byetta again about 3 weeks ago  This was after an episode of low blood sugar and she felt weak with this, apparently her blood sugar was in the upper 60s  Previously she had stopped it because she thought it was causing her veins in the arms to swell  However she did not notify us and has not started back on NovoLog also  And sometime after her last visit and she thinks this is because it made her veins in the arm swell up  However she still has not checked her blood sugar much and mostly in the mornings, she usually is getting up early in the morning around 4 AM  She usually checks her sugar a couple of times in the mornings and not clear which readings are after breakfast  Also cannot explain why she had a glucose of 356 on Sunday morning, she thinks it was from eating a late evening meal  She has only one reading around 8 PM and not clear if she is getting any high readings after meals consistently  Overall her fasting readings appear to be similar to her last visit  Nonfasting  readings have been as low as 72 but this was on the same day when her fasting was 76    Side effects from medications:  Invokana:? Headache.  Victoza ?  Swelling in neck      Monitors blood glucose: 3 a day.    Glucometer: Accucheck         Blood Glucose readings from meter download:   Mean values apply above for all meters except median for One Touch  PRE-MEAL Fasting Lunch Dinner Bedtime Overall  Glucose range:  72-356   122    Mean/median:  147     13 6+/-64   POST-MEAL PC Breakfast PC Lunch PC Dinner  Glucose range:  142  72-238   Mean/median:      PREVIOUS readings:  Mean values apply above for all meters except median for One Touch  PRE-MEAL Fasting Lunch Dinner Overnight  Overall  Glucose range: 98-178  66-174   111-271    Mean/median: 147    156  128 +/-39    POST-MEAL PC Breakfast PC Lunch PC Dinner  Glucose range:   116-207   Mean/median:        Meals: 2-3 meals per day. . Dinner 7 pm, variable quantity  Does have protein with breakfast consistently, has egg for protein  Physical activity: exercise: walking a little  Wt Readings from Last 3 Encounters:  03/26/18 234 lb 12.8 oz (106.5 kg)  02/20/18 236 lb (107 kg)  12/27/17 231 lb 3.2 oz (104.9 kg)   GLYCEMIC CONTROL:  Lab Results  Component Value Date   HGBA1C 7.5 03/26/2018   HGBA1C 7.3 12/27/2017   HGBA1C 7.1 09/08/2017   Lab Results  Component Value Date   MICROALBUR <0.7 06/08/2017   LDLCALC 92 12/27/2017   CREATININE 1.38 (H) 12/27/2017      Other problems discussed today: See review of systems   Allergies as of 03/26/2018      Reactions   Latex    Itching and breaking out      Medication List        Accurate as of 03/26/18  9:18 AM. Always use your most recent med list.          ACCU-CHEK AVIVA PLUS test strip Generic drug:  glucose blood USE TO TEST TWICE DAILY.   ACCU-CHEK AVIVA PLUS w/Device Kit Use to check blood sugar 2 times per day dx code E11.65   ALPRAZolam  0.5 MG tablet Commonly known as:  XANAX Take 0.5 mg by mouth at bedtime. Takes half a tab at bedtime   amLODipine 10 MG tablet Commonly known as:  NORVASC TAKE (1) TABLET BY MOUTH ONCE DAILY.   B-D ULTRAFINE III SHORT PEN 31G X 8 MM Misc Generic drug:  Insulin Pen Needle USE AS DIRECTED 2-3 TIMES A DAY.   exenatide 10 MCG/0.04ML Sopn injection Commonly known as:  BYETTA 10 MCG PEN Inject 0.04 mLs (10 mcg total) into the skin 2 (two) times daily with a meal.   gabapentin 100 MG capsule Commonly known as:  NEURONTIN TAKE 3 CAPSULES BY MOUTH AT BEDTIME.   HUMALOG KWIKPEN 100 UNIT/ML KiwkPen Generic drug:  insulin lispro INJECT 6 TO 7 UNITS SUBCUTANEOUSLY BEFORE SUPPER IF GLUCOSE IS HIGH.   ibuprofen 200 MG tablet Commonly known as:  ADVIL,MOTRIN Take 400 mg by mouth as needed.   LANTUS SOLOSTAR 100 UNIT/ML Solostar Pen Generic drug:  Insulin Glargine INJECT 44 UNITS SUBCUTANEOUSLY ONCE EVERY MORNING.   loratadine 10 MG tablet Commonly known as:  CLARITIN Take 10 mg by mouth daily as needed for allergies.   metFORMIN 850 MG tablet Commonly known as:  GLUCOPHAGE TAKE (1) TABLET BY MOUTH TWICE DAILY.   metoprolol tartrate 50 MG tablet Commonly known as:  LOPRESSOR TAKE 0.5 (ONE-HALF) TABLET BY MOUTH TWICE DAILY.   multivitamin tablet Take 1 tablet by mouth once a week.   nystatin-triamcinolone ointment Commonly known as:  MYCOLOG Apply 1 application topically 2 (two) times daily.   olmesartan 40 MG tablet Commonly known as:  BENICAR   omeprazole 20 MG capsule Commonly known as:  PRILOSEC Take 20 mg by mouth daily.   onetouch ultrasoft lancets   BAYER MICROLET LANCETS lancets Use as instructed to check blood sugar 3 times daily. Dx Code E11.65   polyethylene glycol powder powder Commonly known as:  GLYCOLAX/MIRALAX Take 1 Container by mouth as needed for moderate constipation.   potassium chloride 10 MEQ tablet Commonly known as:  K-DUR Take 10 mEq by  mouth daily.   pravastatin 20 MG tablet Commonly known as:  PRAVACHOL TAKE ONE TABLET BY MOUTH DAILY.   torsemide 20 MG tablet Commonly known as:  DEMADEX TAKE 1 TABLET BY MOUTH ONCE DAILY AS NEEDED.       Allergies:  Allergies  Allergen Reactions  . Latex     Itching  and breaking out    Past Medical History:  Diagnosis Date  . Atrial fibrillation (HCC)    Paroxysmal; LVH; nl EF; onset in 1999  . Burning with urination 02/23/2016  . Diabetes mellitus    insulin; managed by Dr. Dwyane Dee  . 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 Hyperlipidemia   . Hypertension   . Hypothyroidism    history of goiter; nl TSH off medication  . Itching with irritation 02/27/2015  . Obesity 07/06/2012  . Palpitations    negative event recorder in 2009  . UTI (urinary tract infection) 02/23/2016    Past Surgical History:  Procedure Laterality Date  . BIOPSY THYROID    . CESAREAN SECTION    . CHOLECYSTECTOMY  11/95  . COLONOSCOPY  2007  . RETINAL DETACHMENT SURGERY  2009  . TUBAL LIGATION  1980's    Family History  Problem Relation Age of Onset  . Diabetes Mother   . Diabetes Daughter        gestational diabetes  . Cancer Father   . Thyroid disease Brother   . Other Son        MVA    Social History:  reports that she quit smoking about 31 years ago. Her smoking use included cigarettes. She started smoking about 51 years ago. She smoked 1.00 pack per day. She has never used smokeless tobacco. She reports that she does not drink alcohol or use drugs.  Review of Systems:   HYPERTENSION:  Followed by PCP, usually controlled on Norvasc and Benicar    HYPERLIPIDEMIA: The lipid abnormality consists of elevated LDL; she has been getting good results with Pravachol   Lab Results  Component Value Date   CHOL 165 12/27/2017   HDL 60.40 12/27/2017   LDLCALC 92 12/27/2017   TRIG 65.0 12/27/2017   CHOLHDL 3 12/27/2017    MULTINODULAR GOITER: She has had a  long-standing multinodular goiter since at least 2005 which has been fairly stable clinically She does have history of occasional palpitations for years Metoprolol has been prescribed by cardiologist  No local pressure symptoms of choking or difficulty swallowing but she thinks that at times her goiter is swollen more  Her goiter has been persistently autonomous.   No evidence of overt hyperthyroidism  with normal free T4 and free T3 levels; TSH has been consistently suppressed  Her I-131 uptake in 2003 was 17% and her  thyroid ultrasound in 2014 did not show distinct nodules but a relative increase in size  Lab Results  Component Value Date   TSH 0.29 (L) 09/08/2017   TSH 0.02 (L) 06/08/2017   FREET4 0.79 09/08/2017   FREET4 0.74 06/08/2017   Lab Results  Component Value Date   T3FREE 3.2 09/08/2017   T3FREE 3.0 06/08/2017   T3FREE 3.3 10/11/2016       Examination:   BP 140/62 (BP Location: Left Arm, Patient Position: Sitting, Cuff Size: Large)   Pulse 75   Ht 5' 7"  (1.702 m)   Wt 234 lb 12.8 oz (106.5 kg)   SpO2 99%   BMI 36.77 kg/m   Body mass index is 36.77 kg/m.   Neck circumference 43.5 cm  she has a soft to firm nodular thyroid enlargement bilaterally, about 4 times normal No stridor Biceps reflexes are slightly brisk No tremor  ASSESSMENT/ PLAN:  Diabetes Type 2 on insulin  See history of present illness for detailed discussion of  current management, blood sugar  patterns and problems identified  Her BMI is 37 She still has somewhat high A1c and this is going up Blood sugars are variable but she is checking blood sugars mostly in the mornings and not clear if some of her high readings are related to postprandial hyperglycemia and the night before Without any monitoring after evening meal difficult to know what her blood sugar patterns are after meals She did probably do better with Byetta when she takes it regularly but she is concerned about low sugars  when she takes the 10 mcg dose Recently no hypoglycemia but blood sugars have been as low as 72 with current dose of 42 units Lantus  She is starting to gain back weight also  Recommendations:  Restart Byetta 10 mcg since it will help her postprandial readings Discussed how it works and to take it 30 minutes before eating both morning and evening Cut back Lantus to 36 units for now further based on her readings in the mornings To check more blood sugars after meals especially evening meal to see if she has postprandial control consistently She will call if she has any consistent pattern of high or low sugars Stay on metformin  Needs regular walking or weight loss    Discussed blood sugar targets of at least under 180 after meals  MULTINODULAR goiter: She is concerned about the swelling but no pressure symptoms and is not wanting to consider surgery Will need follow-up thyroid levels and if TSH in hyperthyroid range may consider doing an uptake again  Hypercholesterolemia: Lipids well controlled on the last measurement  Hypertension: Blood pressure is well-controlled  Allergic rhinitis: She is having multiple symptoms including cough and drainage, recommended OTC Claritin at least and follow-up with PCP  Counseling time on subjects discussed in assessment and plan sections is over 50% of today's 25 minute visit  Patient Instructions  With Byetta reduce lantus to 36 units        Elaina Cara 03/26/2018, 9:18 AM

## 2018-03-26 NOTE — Patient Instructions (Addendum)
With Byetta reduce lantus to 36 units 30 min before  Check blood sugars on waking up  3-4/7  Also check blood sugars about 2 hours after a meal and do this after different meals by rotation  Recommended blood sugar levels on waking up is 90-130 and about 2 hours after meal is 130-160  Please bring your blood sugar monitor to each visit, thank you  Generic Claritin, Allegra Zyrtec for allergy

## 2018-04-05 ENCOUNTER — Ambulatory Visit: Payer: Medicare Other | Admitting: Adult Health

## 2018-04-13 ENCOUNTER — Ambulatory Visit: Payer: Medicare Other | Admitting: Adult Health

## 2018-04-25 DIAGNOSIS — E785 Hyperlipidemia, unspecified: Secondary | ICD-10-CM | POA: Diagnosis not present

## 2018-04-25 DIAGNOSIS — I10 Essential (primary) hypertension: Secondary | ICD-10-CM | POA: Diagnosis not present

## 2018-04-25 DIAGNOSIS — E1165 Type 2 diabetes mellitus with hyperglycemia: Secondary | ICD-10-CM | POA: Diagnosis not present

## 2018-04-25 DIAGNOSIS — N390 Urinary tract infection, site not specified: Secondary | ICD-10-CM | POA: Diagnosis not present

## 2018-05-01 ENCOUNTER — Ambulatory Visit: Payer: Medicare Other | Admitting: Adult Health

## 2018-05-01 DIAGNOSIS — E1051 Type 1 diabetes mellitus with diabetic peripheral angiopathy without gangrene: Secondary | ICD-10-CM | POA: Diagnosis not present

## 2018-05-01 DIAGNOSIS — I739 Peripheral vascular disease, unspecified: Secondary | ICD-10-CM | POA: Diagnosis not present

## 2018-05-02 ENCOUNTER — Other Ambulatory Visit: Payer: Self-pay | Admitting: Endocrinology

## 2018-05-18 ENCOUNTER — Other Ambulatory Visit: Payer: Self-pay | Admitting: Endocrinology

## 2018-05-18 ENCOUNTER — Other Ambulatory Visit: Payer: Self-pay | Admitting: Cardiovascular Disease

## 2018-06-05 ENCOUNTER — Other Ambulatory Visit: Payer: Self-pay | Admitting: Endocrinology

## 2018-06-21 ENCOUNTER — Other Ambulatory Visit: Payer: Self-pay | Admitting: Endocrinology

## 2018-06-25 DIAGNOSIS — N390 Urinary tract infection, site not specified: Secondary | ICD-10-CM | POA: Diagnosis not present

## 2018-06-25 DIAGNOSIS — N183 Chronic kidney disease, stage 3 (moderate): Secondary | ICD-10-CM | POA: Diagnosis not present

## 2018-06-25 DIAGNOSIS — E1142 Type 2 diabetes mellitus with diabetic polyneuropathy: Secondary | ICD-10-CM | POA: Diagnosis not present

## 2018-06-25 DIAGNOSIS — Z0000 Encounter for general adult medical examination without abnormal findings: Secondary | ICD-10-CM | POA: Diagnosis not present

## 2018-06-25 DIAGNOSIS — I10 Essential (primary) hypertension: Secondary | ICD-10-CM | POA: Diagnosis not present

## 2018-06-26 ENCOUNTER — Encounter: Payer: Self-pay | Admitting: Endocrinology

## 2018-06-26 ENCOUNTER — Ambulatory Visit (INDEPENDENT_AMBULATORY_CARE_PROVIDER_SITE_OTHER): Payer: Medicare Other | Admitting: Endocrinology

## 2018-06-26 VITALS — BP 152/78 | HR 71 | Ht 67.0 in | Wt 243.0 lb

## 2018-06-26 DIAGNOSIS — R600 Localized edema: Secondary | ICD-10-CM | POA: Diagnosis not present

## 2018-06-26 DIAGNOSIS — Z794 Long term (current) use of insulin: Secondary | ICD-10-CM | POA: Diagnosis not present

## 2018-06-26 DIAGNOSIS — E1165 Type 2 diabetes mellitus with hyperglycemia: Secondary | ICD-10-CM

## 2018-06-26 DIAGNOSIS — I10 Essential (primary) hypertension: Secondary | ICD-10-CM

## 2018-06-26 LAB — COMPREHENSIVE METABOLIC PANEL
ALT: 11 U/L (ref 0–35)
AST: 14 U/L (ref 0–37)
Albumin: 4.2 g/dL (ref 3.5–5.2)
Alkaline Phosphatase: 89 U/L (ref 39–117)
BILIRUBIN TOTAL: 0.4 mg/dL (ref 0.2–1.2)
BUN: 19 mg/dL (ref 6–23)
CO2: 29 meq/L (ref 19–32)
CREATININE: 1.01 mg/dL (ref 0.40–1.20)
Calcium: 10 mg/dL (ref 8.4–10.5)
Chloride: 103 mEq/L (ref 96–112)
GFR: 69.46 mL/min (ref >60.00)
GLUCOSE: 212 mg/dL — AB (ref 70–99)
Potassium: 5 mEq/L (ref 3.5–5.1)
Sodium: 138 mEq/L (ref 135–145)
TOTAL PROTEIN: 7.6 g/dL (ref 6.0–8.3)

## 2018-06-26 LAB — POCT GLYCOSYLATED HEMOGLOBIN (HGB A1C): Hemoglobin A1C: 8.1 % — AB (ref 4.0–5.6)

## 2018-06-26 MED ORDER — TRIAMTERENE-HCTZ 37.5-25 MG PO TABS: 1.0000 | ORAL_TABLET | Freq: Every day | ORAL | 3 refills | Status: DC

## 2018-06-26 MED ORDER — EXENATIDE 5 MCG/0.02ML ~~LOC~~ SOPN: 5.0000 ug | PEN_INJECTOR | Freq: Two times a day (BID) | SUBCUTANEOUS | 2 refills | Status: DC

## 2018-06-26 NOTE — Progress Notes (Signed)
Patient ID: Brittney [name], female   DOB: Feb 19, 1946, 72 y.o.   MRN: [id number]   Reason for Appointment: Followup of diabetes  History of Present Illness    Type 2 DIABETES MELITUS, date of diagnosis: 1976      She has had long-standing diabetes taking insulin. Because of her difficulties with compliance using mealtime insulin she was given in Byetta in 2011 and subsequently Victoza in addition to her Lantus and this helped her control However she tends to be erratic with her day-to-day care and has not had adequate A1c levels in the past, usually over 7%  RECENT history:   Insulin regimen: Lantus 40 units daily in a.m., NovoLog 0-6 units at dinner   Non-insulin hypoglycemic drugs: not taking Byetta  before meals, Metformin   850 twice a day  Her A1c is gradually increasing, now 8.1 and has been as low as 7.1   Current glucose patterns and problems:  She has stopped taking Byetta again after she was recommended this on her last visit  She thought which was causing nausea although previously had tolerated this well  Her fasting blood sugars are fairly similar to her last visit indicating that she has probably very high readings after meals sometimes  However does have fluctuation in her morning readings  She will take NovoLog only when her blood sugar goes up to 200 but not otherwise  Has only one reading in the last 2 weeks after meals  Appears to have gained weight    Side effects from medications:  Invokana:? Headache.  Victoza ?  Swelling in neck      Monitors blood glucose: 3 a day.    Glucometer: Accucheck         Blood Glucose readings from meter download:  Mean values apply above for all meters except median for One Touch  PRE-MEAL Fasting Lunch Dinner Bedtime Overall  Glucose range:  77-183      Mean/median:     141   POST-MEAL PC Breakfast PC Lunch PC Dinner  Glucose range:    132  Mean/median:        Previous readings:  PRE-MEAL Fasting  Lunch Dinner Bedtime Overall  Glucose range:  72-356   122    Mean/median:  147     136+/-64   POST-MEAL PC Breakfast PC Lunch PC Dinner  Glucose range:  142  72-238   Mean/median:        Meals: 2-3 meals per day. . Dinner 7 pm, variable quantity and carbohydrates  Does have protein with breakfast consistently, has egg for protein  Physical activity: exercise: walking less recently, having foot pain        Wt Readings from Last 3 Encounters:  06/26/18 243 lb (110.2 kg)  03/26/18 234 lb 12.8 oz (106.5 kg)  02/20/18 236 lb (107 kg)   GLYCEMIC CONTROL:  Lab Results  Component Value Date   HGBA1C 8.1 (A) 06/26/2018   HGBA1C 7.5 03/26/2018   HGBA1C 7.3 12/27/2017   Lab Results  Component Value Date   MICROALBUR 2.0 (H) 03/26/2018   [place] 92 12/27/2017   CREATININE 1.04 03/26/2018      Other problems discussed today: See review of systems   Allergies as of 06/26/2018      Reactions   Latex    Itching and breaking out      Medication List        Accurate as of 06/26/18  9:38 AM. Always use your  most recent med list.          ACCU-CHEK AVIVA PLUS test strip Generic drug:  glucose blood USE TO TEST TWICE DAILY.   ACCU-CHEK AVIVA PLUS w/Device Kit Use to check blood sugar 2 times per day dx code E11.65   ALPRAZolam 0.5 MG tablet Commonly known as:  XANAX Take 0.5 mg by mouth at bedtime. Takes half a tab at bedtime   amLODipine 10 MG tablet Commonly known as:  NORVASC TAKE (1) TABLET BY MOUTH ONCE DAILY.   B-D ULTRAFINE III SHORT PEN 31G X 8 MM Misc Generic drug:  Insulin Pen Needle USE AS DIRECTED 2-3 TIMES A DAY.   exenatide 10 MCG/0.04ML Sopn injection Commonly known as:  BYETTA 10 MCG PEN Inject 0.04 mLs (10 mcg total) into the skin 2 (two) times daily with a meal.   gabapentin 100 MG capsule Commonly known as:  NEURONTIN TAKE 3 CAPSULES BY MOUTH AT BEDTIME.   HUMALOG KWIKPEN 100 UNIT/ML KiwkPen Generic drug:  insulin lispro INJECT 6 TO 7 UNITS  SUBCUTANEOUSLY BEFORE SUPPER IF GLUCOSE IS HIGH.   ibuprofen 200 MG tablet Commonly known as:  ADVIL,MOTRIN Take 400 mg by mouth as needed.   LANTUS SOLOSTAR 100 UNIT/ML Solostar Pen Generic drug:  Insulin Glargine INJECT 44 UNITS SUBCUTANEOUSLY ONCE EVERY MORNING.   loratadine 10 MG tablet Commonly known as:  CLARITIN Take 10 mg by mouth daily as needed for allergies.   metFORMIN 850 MG tablet Commonly known as:  GLUCOPHAGE TAKE (1) TABLET BY MOUTH TWICE DAILY.   metoprolol tartrate 50 MG tablet Commonly known as:  LOPRESSOR TAKE 0.5 (ONE-HALF) TABLET BY MOUTH TWICE DAILY.   multivitamin tablet Take 1 tablet by mouth once a week.   nystatin-triamcinolone ointment Commonly known as:  MYCOLOG Apply 1 application topically 2 (two) times daily.   olmesartan 40 MG tablet Commonly known as:  BENICAR   omeprazole 20 MG capsule Commonly known as:  PRILOSEC Take 20 mg by mouth daily.   onetouch ultrasoft lancets   BAYER MICROLET LANCETS lancets Use as instructed to check blood sugar 3 times daily. Dx Code E11.65   polyethylene glycol powder powder Commonly known as:  GLYCOLAX/MIRALAX Take 1 Container by mouth as needed for moderate constipation.   potassium chloride 10 MEQ tablet Commonly known as:  K-DUR Take 10 mEq by mouth daily.   pravastatin 20 MG tablet Commonly known as:  PRAVACHOL TAKE ONE TABLET BY MOUTH DAILY.   torsemide 20 MG tablet Commonly known as:  DEMADEX TAKE 1 TABLET BY MOUTH ONCE DAILY AS NEEDED.       Allergies:  Allergies  Allergen Reactions  . Latex     Itching and breaking out    Past Medical History:  Diagnosis Date  . Atrial fibrillation (HCC)    Paroxysmal; LVH; nl EF; onset in 1999  . Burning with urination 02/23/2016  . Diabetes mellitus    insulin; managed by Dr. Dwyane Dee  . 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 Hyperlipidemia   . Hypertension   . Hypothyroidism    history of goiter; nl TSH off  medication  . Itching with irritation 02/27/2015  . Obesity 07/06/2012  . Palpitations    negative event recorder in 2009  . UTI (urinary tract infection) 02/23/2016    Past Surgical History:  Procedure Laterality Date  . BIOPSY THYROID    . CESAREAN SECTION    . CHOLECYSTECTOMY  11/95  . COLONOSCOPY  2007  . RETINAL DETACHMENT SURGERY  2009  . TUBAL LIGATION  1980's    Family History  Problem Relation Age of Onset  . Diabetes Mother   . Diabetes Daughter        gestational diabetes  . Cancer Father   . Thyroid disease Brother   . Other Son        MVA    Social History:  reports that she quit smoking about 31 years ago. Her smoking use included cigarettes. She started smoking about 51 years ago. She smoked 1.00 pack per day. She has never used smokeless tobacco. She reports that she does not drink alcohol or use drugs.  Review of Systems:   HYPERTENSION:  Followed by PCP, usually controlled on Norvasc and Benicar  Blood pressure is relatively high She has seen her PCP yesterday  Edema: She takes Demadex every other day, does not like to take this because of muscle cramps  HYPERLIPIDEMIA: The lipid abnormality consists of elevated LDL; she has been getting good results with Pravachol   Lab Results  Component Value Date   CHOL 165 12/27/2017   HDL 60.40 12/27/2017   LDLCALC 92 12/27/2017   TRIG 65.0 12/27/2017   CHOLHDL 3 12/27/2017    MULTINODULAR GOITER: She has had a long-standing multinodular goiter since at least 2005 which has been fairly stable clinically She does have history of occasional palpitations for years Metoprolol has been prescribed by cardiologist  No local pressure symptoms of choking or difficulty swallowing but she thinks that at times her goiter is swollen more  Her goiter has been persistently autonomous.   No evidence of overt hyperthyroidism  with normal free T4 and free T3 levels; TSH has been consistently suppressed  Her I-131 uptake  in 2003 was 17% and her  thyroid ultrasound in 2014 did not show distinct nodules but a relative increase in size  Lab Results  Component Value Date   TSH 0.10 (L) 03/26/2018   TSH 0.29 (L) 09/08/2017   FREET4 0.78 03/26/2018   FREET4 0.79 09/08/2017   Lab Results  Component Value Date   T3FREE 3.0 03/26/2018   T3FREE 3.2 09/08/2017   T3FREE 3.0 06/08/2017       She is asking about frequent urination, nocturia and some difficulty with incontinence especially at night, has discussed with PCP   Examination:   BP (!) 152/78 (BP Location: Left Arm, Patient Position: Sitting, Cuff Size: Normal)   Pulse 71   Ht 5' 7"  (1.702 m)   Wt 243 lb (110.2 kg)   SpO2 96%   BMI 38.06 kg/m   Body mass index is 38.06 kg/m.     ASSESSMENT/ PLAN:  Diabetes Type 2 on insulin  See history of present illness for detailed discussion of  current management, blood sugar patterns and problems identified  Her BMI is 38  She has a progressively higher A1c and now 8.1 Discussed to the patient that this is likely to be from postprandial hyperglycemia which she does not monitor She was recommended with Byetta but she did not take this because of nausea and did not report this to [place] Has difficulty with weight gain also recently Also does not take mealtime insulin as directed proactively for larger meals  Recommendations:  Restart Byetta 5 mcg to help postprandial hyperglycemia She should tolerate this better than 10 mcg Discussed taking this twice a day before eating and usually 30 minutes before She can continue the same dose of Lantus  40 units unless morning sugars start getting lower Emphasized need to check readings after meals and not just in the morning, discussed that her A1c indicates significant postprandial hyperglycemia  Needs regular walking and if she can start doing this after treatment with the foot doctor would help her weight loss   MULTINODULAR goiter: Needs periodic follow-up  of thyroid function  EDEMA: This is likely to be from amlodipine high doses Since she cannot tolerate torsemide because of muscle cramps she will switch to Maxide 25 mg daily which will help her blood pressure also At the same time she can cut amlodipine in half  Hypertension: Blood pressure is currently not well-controlled  Overactive bladder: She will discuss treatment with PCP   Counseling time on subjects discussed in assessment and plan sections is over 50% of today's 25 minute visit  There are no Patient Instructions on file for this visit.    Elayne Snare 06/26/2018, 9:38 AM

## 2018-06-26 NOTE — Patient Instructions (Addendum)
  Triamterene HCT without any potassium    stop the potassium and torsemide   must check the blood sugars after evening meal at least every other day and some after breakfast also So you do not have to check his sugar every single morning  Take only half amlodipine daily

## 2018-06-27 DIAGNOSIS — M7752 Other enthesopathy of left foot: Secondary | ICD-10-CM | POA: Diagnosis not present

## 2018-06-27 DIAGNOSIS — M722 Plantar fascial fibromatosis: Secondary | ICD-10-CM | POA: Diagnosis not present

## 2018-06-27 DIAGNOSIS — G5762 Lesion of plantar nerve, left lower limb: Secondary | ICD-10-CM | POA: Diagnosis not present

## 2018-06-27 DIAGNOSIS — L600 Ingrowing nail: Secondary | ICD-10-CM | POA: Diagnosis not present

## 2018-07-02 ENCOUNTER — Telehealth: Payer: Self-pay | Admitting: Emergency Medicine

## 2018-07-02 NOTE — Telephone Encounter (Signed)
She should try taking half a tablet of the triamterene HCT that was prescribed

## 2018-07-02 NOTE — Telephone Encounter (Signed)
Pt called and stated the doctor put her on a fluid pill and it is giving her cramps. She asked that you give her a call back to discuss this. Thanks.

## 2018-07-02 NOTE — Telephone Encounter (Signed)
Called pt and gave her MD message. Pt verbalized understanding. 

## 2018-07-02 NOTE — Telephone Encounter (Signed)
Please advise 

## 2018-07-12 ENCOUNTER — Ambulatory Visit: Payer: Medicare Other | Admitting: Adult Health

## 2018-07-18 ENCOUNTER — Ambulatory Visit: Payer: Medicare Other | Admitting: Adult Health

## 2018-07-25 DIAGNOSIS — M7752 Other enthesopathy of left foot: Secondary | ICD-10-CM | POA: Diagnosis not present

## 2018-07-25 DIAGNOSIS — G5762 Lesion of plantar nerve, left lower limb: Secondary | ICD-10-CM | POA: Diagnosis not present

## 2018-07-30 ENCOUNTER — Other Ambulatory Visit (HOSPITAL_COMMUNITY): Payer: Self-pay | Admitting: Family Medicine

## 2018-07-30 ENCOUNTER — Other Ambulatory Visit: Payer: Self-pay | Admitting: Endocrinology

## 2018-07-30 DIAGNOSIS — Z1231 Encounter for screening mammogram for malignant neoplasm of breast: Secondary | ICD-10-CM

## 2018-07-31 ENCOUNTER — Encounter (INDEPENDENT_AMBULATORY_CARE_PROVIDER_SITE_OTHER): Payer: Medicare Other | Admitting: Ophthalmology

## 2018-08-01 ENCOUNTER — Ambulatory Visit: Payer: Medicare Other | Admitting: Adult Health

## 2018-08-14 ENCOUNTER — Ambulatory Visit: Payer: Medicare Other | Admitting: Endocrinology

## 2018-08-16 DIAGNOSIS — L97521 Non-pressure chronic ulcer of other part of left foot limited to breakdown of skin: Secondary | ICD-10-CM | POA: Diagnosis not present

## 2018-08-16 DIAGNOSIS — L97529 Non-pressure chronic ulcer of other part of left foot with unspecified severity: Secondary | ICD-10-CM | POA: Diagnosis not present

## 2018-08-16 DIAGNOSIS — G5762 Lesion of plantar nerve, left lower limb: Secondary | ICD-10-CM | POA: Diagnosis not present

## 2018-08-17 ENCOUNTER — Telehealth: Payer: Self-pay | Admitting: Endocrinology

## 2018-08-17 NOTE — Telephone Encounter (Signed)
[facility] "Caller states, she needs to speak with Dr. Jodelle Green nurse. She had an appt that she never went to and she is needing to know if she needs a appt., and she is out of a blood pressure medicine." Please advice. [phone] # [phone]

## 2018-08-17 NOTE — Telephone Encounter (Signed)
Called pt and advised her to call her pharmacy because on 06/2018 30 day supply was sent with 3 refills.

## 2018-08-20 ENCOUNTER — Other Ambulatory Visit: Payer: Self-pay | Admitting: Endocrinology

## 2018-08-20 ENCOUNTER — Other Ambulatory Visit: Payer: Self-pay | Admitting: Cardiovascular Disease

## 2018-08-23 DIAGNOSIS — L97521 Non-pressure chronic ulcer of other part of left foot limited to breakdown of skin: Secondary | ICD-10-CM | POA: Diagnosis not present

## 2018-08-23 DIAGNOSIS — M21611 Bunion of right foot: Secondary | ICD-10-CM | POA: Diagnosis not present

## 2018-08-23 DIAGNOSIS — M21612 Bunion of left foot: Secondary | ICD-10-CM | POA: Diagnosis not present

## 2018-08-27 ENCOUNTER — Other Ambulatory Visit: Payer: Self-pay | Admitting: Endocrinology

## 2018-08-27 ENCOUNTER — Ambulatory Visit (HOSPITAL_COMMUNITY)
Admission: RE | Admit: 2018-08-27 | Discharge: 2018-08-27 | Disposition: A | Discharge status: Home / Self Care | Payer: Medicare Other | Source: Ambulatory Visit | Attending: Family Medicine | Admitting: Family Medicine

## 2018-08-27 ENCOUNTER — Ambulatory Visit (INDEPENDENT_AMBULATORY_CARE_PROVIDER_SITE_OTHER): Payer: Medicare Other

## 2018-08-27 DIAGNOSIS — Z1231 Encounter for screening mammogram for malignant neoplasm of breast: Secondary | ICD-10-CM | POA: Insufficient documentation

## 2018-08-27 DIAGNOSIS — Z23 Encounter for immunization: Secondary | ICD-10-CM | POA: Diagnosis not present

## 2018-08-27 NOTE — Progress Notes (Signed)
Pt here for flu shot.States Latex gloves causes her hands to itch at times, no severe reaction. Has received flu shot every year.

## 2018-08-28 DIAGNOSIS — E1122 Type 2 diabetes mellitus with diabetic chronic kidney disease: Secondary | ICD-10-CM | POA: Diagnosis not present

## 2018-08-28 DIAGNOSIS — N183 Chronic kidney disease, stage 3 (moderate): Secondary | ICD-10-CM | POA: Diagnosis not present

## 2018-08-28 DIAGNOSIS — I10 Essential (primary) hypertension: Secondary | ICD-10-CM | POA: Diagnosis not present

## 2018-08-28 DIAGNOSIS — R531 Weakness: Secondary | ICD-10-CM | POA: Diagnosis not present

## 2018-09-11 DIAGNOSIS — N183 Chronic kidney disease, stage 3 (moderate): Secondary | ICD-10-CM | POA: Diagnosis not present

## 2018-09-11 DIAGNOSIS — I10 Essential (primary) hypertension: Secondary | ICD-10-CM | POA: Diagnosis not present

## 2018-09-11 DIAGNOSIS — Z794 Long term (current) use of insulin: Secondary | ICD-10-CM | POA: Diagnosis not present

## 2018-09-12 DIAGNOSIS — L97521 Non-pressure chronic ulcer of other part of left foot limited to breakdown of skin: Secondary | ICD-10-CM | POA: Diagnosis not present

## 2018-09-14 ENCOUNTER — Encounter (INDEPENDENT_AMBULATORY_CARE_PROVIDER_SITE_OTHER): Payer: Medicare Other | Admitting: Ophthalmology

## 2018-09-14 DIAGNOSIS — H35033 Hypertensive retinopathy, bilateral: Secondary | ICD-10-CM

## 2018-09-14 DIAGNOSIS — E113593 Type 2 diabetes mellitus with proliferative diabetic retinopathy without macular edema, bilateral: Secondary | ICD-10-CM | POA: Diagnosis not present

## 2018-09-14 DIAGNOSIS — H35372 Puckering of macula, left eye: Secondary | ICD-10-CM | POA: Diagnosis not present

## 2018-09-14 DIAGNOSIS — I10 Essential (primary) hypertension: Secondary | ICD-10-CM

## 2018-09-14 DIAGNOSIS — E11311 Type 2 diabetes mellitus with unspecified diabetic retinopathy with macular edema: Secondary | ICD-10-CM

## 2018-09-14 DIAGNOSIS — H43812 Vitreous degeneration, left eye: Secondary | ICD-10-CM

## 2018-09-14 LAB — HM DIABETES EYE EXAM

## 2018-09-17 ENCOUNTER — Encounter: Payer: Self-pay | Admitting: Endocrinology

## 2018-09-20 ENCOUNTER — Other Ambulatory Visit: Payer: Self-pay | Admitting: Endocrinology

## 2018-09-20 ENCOUNTER — Other Ambulatory Visit: Payer: Self-pay | Admitting: Cardiovascular Disease

## 2018-09-21 ENCOUNTER — Telehealth: Payer: Self-pay | Admitting: Endocrinology

## 2018-09-21 NOTE — Telephone Encounter (Signed)
Pt stated that she feels like her blood pressure was better controlled on olmesartan and would like to go back to taking that along with her amlodipine. Recent readings have been 153/73 158/75 150/61 154/74 and 154/71 please advise.

## 2018-09-21 NOTE — Telephone Encounter (Signed)
Patient requests to be called at ph# [phone] re: patient wants to discuss blood pressure medication. The new blood pressure medication is not helping. Patient wants to know if she should go back on Olmesartan medoxom. Please call patient at the above ph# to advise.

## 2018-09-24 NOTE — Telephone Encounter (Signed)
Pt informed and stated understanding and stated that she would call if she had any other issues and that she has an appt the end of this month and will follow up then

## 2018-09-26 NOTE — Progress Notes (Deleted)
Cardiology Office Note    Date:  09/26/2018   ID:  SHENG PRITZ, DOB [birth year], MRN [id number]  PCP:  Iona Beard, MD  Cardiologist: Kate Sable, MD EPS: None  No chief complaint on file.   History of Present Illness:  Brittney [name] is a 72 y.o. female with history of hypertension, DM, HLD, remote paroxysmal atrial fibrillation and has never been anticoagulated.  Has had normal LV function on echo and no arrhythmias on event monitor.  [place] on metoprolol.  Last saw Dr. Bronson Ing 02/20/2018 and if she had documented recurrence given her history of hypertension diabetes she would need anticoagulation.  Patient added on to my schedule because of increased palpitations  Past Medical History:  Diagnosis Date  . Atrial fibrillation (HCC)    Paroxysmal; LVH; nl EF; onset in 1999  . Burning with urination 02/23/2016  . Diabetes mellitus    insulin; managed by Dr. Dwyane Dee  . 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 Hyperlipidemia   . Hypertension   . Hypothyroidism    history of goiter; nl TSH off medication  . Itching with irritation 02/27/2015  . Obesity 07/06/2012  . Palpitations    negative event recorder in 2009  . UTI (urinary tract infection) 02/23/2016    Past Surgical History:  Procedure Laterality Date  . BIOPSY THYROID    . CESAREAN SECTION    . CHOLECYSTECTOMY  11/95  . COLONOSCOPY  2007  . RETINAL DETACHMENT SURGERY  2009  . TUBAL LIGATION  1980's    Current Medications: No outpatient medications have been marked as taking for the 10/01/18 encounter (Appointment) with Imogene Burn, PA-C.     Allergies:   Latex   Social History   Socioeconomic History  . Marital status: Married    Spouse name: Not on file  . Number of children: Not on file  . Years of education: Not on file  . Highest education level: Not on file  Occupational History  . Occupation: Retired  Scientific laboratory technician  . Financial resource strain: Not on file    . Food insecurity:    Worry: Not on file    Inability: Not on file  . Transportation needs:    Medical: Not on file    Non-medical: Not on file  Tobacco Use  . Smoking status: Former Smoker    Packs/day: 1.00    Types: Cigarettes    Start date: 11/21/1966    Last attempt to quit: 08/13/1986    Years since quitting: 32.1  . Smokeless tobacco: Never Used  Substance and Sexual Activity  . Alcohol use: No    Alcohol/week: 0.0 standard drinks  . Drug use: No  . Sexual activity: Never    Birth control/protection: Post-menopausal  Lifestyle  . Physical activity:    Days per week: Not on file    Minutes per session: Not on file  . Stress: Not on file  Relationships  . Social connections:    Talks on phone: Not on file    Gets together: Not on file    Attends religious service: Not on file    Active member of club or organization: Not on file    Attends meetings of clubs or organizations: Not on file    Relationship status: Not on file  Other Topics Concern  . Not on file  Social History Narrative   No regular exercise     Family History:  The patient's ***  family history includes Cancer in her father; Diabetes in her daughter and mother; Other in her son; Thyroid disease in her brother.   ROS:   Please see the history of present illness.    ROS All other systems reviewed and are negative.   PHYSICAL EXAM:   VS:  There were no vitals taken for this visit.  Physical Exam  GEN: Well nourished, well developed, in no acute distress  HEENT: normal  Neck: no JVD, carotid bruits, or masses Cardiac:RRR; no murmurs, rubs, or gallops  Respiratory:  clear to auscultation bilaterally, normal work of breathing GI: soft, nontender, nondistended, + BS Ext: without cyanosis, clubbing, or edema, Good distal pulses bilaterally MS: no deformity or atrophy  Skin: warm and dry, no rash Neuro:  Alert and Oriented x 3, Strength and sensation are intact Psych: euthymic mood, full affect  Wt  Readings from Last 3 Encounters:  06/26/18 243 lb (110.2 kg)  03/26/18 234 lb 12.8 oz (106.5 kg)  02/20/18 236 lb (107 kg)      Studies/Labs Reviewed:   EKG:  EKG is*** ordered today.  The ekg ordered today demonstrates ***  Recent Labs: 03/26/2018: TSH 0.10 06/26/2018: ALT 11; BUN 19; Creatinine, Ser 1.01; Potassium 5.0; Sodium 138   Lipid Panel    Component Value Date/Time   CHOL 165 12/27/2017 0915   TRIG 65.0 12/27/2017 0915   HDL 60.40 12/27/2017 0915   CHOLHDL 3 12/27/2017 0915   VLDL 13.0 12/27/2017 0915   LDLCALC 92 12/27/2017 0915    Additional studies/ records that were reviewed today include:  2D echo 6/2017Study Conclusions   - Left ventricle: The cavity size was normal. Systolic function was   normal. The estimated ejection fraction was in the range of 60%   to 65%. Wall motion was normal; there were no regional wall   motion abnormalities. Left ventricular diastolic function   parameters were normal. - Atrial septum: No defect or patent foramen ovale was identified.    Event monitor 6/2017Study Highlights    Sinus rhythm. No arrhythmias. Symptoms correlated with sinus rhythm.        ASSESSMENT:    1. Paroxysmal atrial fibrillation (HCC)   2. Essential hypertension, benign   3. Pure hypercholesterolemia      PLAN:  In order of problems listed above:  PAF remote episode not anticoagulated.  Not having any symptoms.  But if she does would warrant anticoagulation with hypertension and diabetes.  Essential hypertension  Hyperlipidemia  Diabetes mellitus  Obesity    Medication Adjustments/Labs and Tests Ordered: Current medicines are reviewed at length with the patient today.  Concerns regarding medicines are outlined above.  Medication changes, Labs and Tests ordered today are listed in the Patient Instructions below. There are no Patient Instructions on file for this visit.   Sumner Boast, PA-C  09/26/2018 3:09 PM    [facility], [place], [place]  [address] Phone: [phone]; Fax: [phone]

## 2018-09-27 ENCOUNTER — Other Ambulatory Visit: Payer: Self-pay | Admitting: Endocrinology

## 2018-09-28 ENCOUNTER — Other Ambulatory Visit: Payer: Self-pay

## 2018-09-28 ENCOUNTER — Emergency Department (HOSPITAL_COMMUNITY)
Admission: EM | Admit: 2018-09-28 | Discharge: 2018-09-28 | Disposition: A | Discharge status: Home / Self Care | Payer: Medicare Other | Attending: Emergency Medicine | Admitting: Emergency Medicine

## 2018-09-28 ENCOUNTER — Encounter: Payer: Self-pay | Admitting: Endocrinology

## 2018-09-28 ENCOUNTER — Encounter (HOSPITAL_COMMUNITY): Payer: Self-pay | Admitting: Emergency Medicine

## 2018-09-28 ENCOUNTER — Emergency Department (HOSPITAL_COMMUNITY): Payer: Medicare Other

## 2018-09-28 DIAGNOSIS — Z91040 Latex allergy status: Secondary | ICD-10-CM | POA: Diagnosis not present

## 2018-09-28 DIAGNOSIS — E039 Hypothyroidism, unspecified: Secondary | ICD-10-CM | POA: Diagnosis not present

## 2018-09-28 DIAGNOSIS — R071 Chest pain on breathing: Secondary | ICD-10-CM | POA: Diagnosis not present

## 2018-09-28 DIAGNOSIS — L97522 Non-pressure chronic ulcer of other part of left foot with fat layer exposed: Secondary | ICD-10-CM | POA: Diagnosis not present

## 2018-09-28 DIAGNOSIS — I10 Essential (primary) hypertension: Secondary | ICD-10-CM | POA: Insufficient documentation

## 2018-09-28 DIAGNOSIS — R079 Chest pain, unspecified: Secondary | ICD-10-CM | POA: Diagnosis not present

## 2018-09-28 DIAGNOSIS — Z7902 Long term (current) use of antithrombotics/antiplatelets: Secondary | ICD-10-CM | POA: Diagnosis not present

## 2018-09-28 DIAGNOSIS — E1140 Type 2 diabetes mellitus with diabetic neuropathy, unspecified: Secondary | ICD-10-CM | POA: Diagnosis not present

## 2018-09-28 DIAGNOSIS — Z87891 Personal history of nicotine dependence: Secondary | ICD-10-CM | POA: Diagnosis not present

## 2018-09-28 DIAGNOSIS — Z79899 Other long term (current) drug therapy: Secondary | ICD-10-CM | POA: Insufficient documentation

## 2018-09-28 DIAGNOSIS — Z794 Long term (current) use of insulin: Secondary | ICD-10-CM | POA: Diagnosis not present

## 2018-09-28 LAB — BASIC METABOLIC PANEL
Anion gap: 6 (ref 5–15)
BUN: 18 mg/dL (ref 8–23)
CO2: 24 mmol/L (ref 22–32)
CREATININE: 0.96 mg/dL (ref 0.44–1.00)
Calcium: 9.2 mg/dL (ref 8.9–10.3)
Chloride: 108 mmol/L (ref 98–111)
GFR, EST NON AFRICAN AMERICAN: 58 mL/min — AB (ref >60)
Glucose, Bld: 141 mg/dL — ABNORMAL HIGH (ref 70–99)
POTASSIUM: 4.1 mmol/L (ref 3.5–5.1)
SODIUM: 138 mmol/L (ref 135–145)

## 2018-09-28 LAB — CBC
HCT: 34.9 % — ABNORMAL LOW (ref 36.0–46.0)
Hemoglobin: 10.7 g/dL — ABNORMAL LOW (ref 12.0–15.0)
MCH: 26.6 pg (ref 26.0–34.0)
MCHC: 30.7 g/dL (ref 30.0–36.0)
MCV: 86.8 fL (ref 80.0–100.0)
NRBC: 0 % (ref 0.0–0.2)
Platelets: 299 10*3/uL (ref 150–400)
RBC: 4.02 MIL/uL (ref 3.87–5.11)
RDW: 14.3 % (ref 11.5–15.5)
WBC: 9.5 10*3/uL (ref 4.0–10.5)

## 2018-09-28 LAB — TROPONIN I

## 2018-09-28 NOTE — ED Triage Notes (Addendum)
Patient complaining of right sided chest pain starting last night. States she lifted heavy equipment on Tuesday and pain is worse with movement.

## 2018-09-28 NOTE — Discharge Instructions (Addendum)
Tests show no evidence of heart attack.  Suspect muscle irritation.  Tylenol or ibuprofen for pain.

## 2018-09-29 NOTE — ED Provider Notes (Signed)
[facility] EMERGENCY DEPARTMENT Provider Note   CSN: [id number] Arrival date & time: 09/28/18  1806     History   Chief Complaint Chief Complaint  Patient presents with  . Chest Pain    HPI Brittney [name] is a 72 y.o. female.  Right upper chest pain (soreness) since Thursday after lifting some clothes at home.  Symptoms have been intermittent.  They returned today at 6:30 AM.  Pain is worse with movement and deep breath.  No dyspnea, diaphoresis, nausea.  No previous history of cardiac disease.  Past medical history includes diabetes, hypertension, and hyperlipidemia.  She is presently pain-free.     Past Medical History:  Diagnosis Date  . Atrial fibrillation (HCC)    Paroxysmal; LVH; nl EF; onset in 1999  . Burning with urination 02/23/2016  . Diabetes mellitus    insulin; managed by Dr. Dwyane Dee  . 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 Hyperlipidemia   . Hypertension   . Hypothyroidism    history of goiter; nl TSH off medication  . Itching with irritation 02/27/2015  . Obesity 07/06/2012  . Palpitations    negative event recorder in 2009  . UTI (urinary tract infection) 02/23/2016    Patient Active Problem List   Diagnosis Date Noted  . UTI (urinary tract infection) 02/23/2016  . Burning with urination 02/23/2016  . Itching with irritation 02/27/2015  . Goiter diffuse, adenomatous 02/05/2014  . Polyneuropathy in diabetes(357.2) 09/25/2013  . Obesity 07/06/2012  . 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 Subclinical hyperthyroidism 06/30/2009  . Type II diabetes mellitus, uncontrolled ([place]) 06/30/2009  . Hyperlipidemia 06/30/2009  . Essential hypertension, benign 06/30/2009  . PAROXYSMAL ATRIAL FIBRILLATION 06/30/2009    Past Surgical History:  Procedure Laterality Date  . BIOPSY THYROID    . CESAREAN SECTION    . CHOLECYSTECTOMY  11/95  . COLONOSCOPY  2007  . RETINAL DETACHMENT SURGERY  2009  . TUBAL LIGATION  1980's      OB History    Gravida  5   Para  2   Term      Preterm      AB  3   Living        SAB  3   TAB      Ectopic      Multiple      Live Births               Home Medications    Prior to Admission medications   Medication Sig Start Date End Date Taking? Authorizing Provider  ACCU-CHEK AVIVA PLUS test strip USE TO TEST TWICE DAILY. 11/11/16   Elayne Snare, MD  ALPRAZolam Duanne Moron) 0.5 MG tablet Take 0.5 mg by mouth at bedtime. Takes half a tab at bedtime 01/19/16   [provider]  amLODipine (NORVASC) 10 MG tablet TAKE (1) TABLET BY MOUTH ONCE DAILY. 05/21/18   Herminio Commons, MD  B-D ULTRAFINE III SHORT PEN 31G X 8 MM MISC USE AS DIRECTED 2-3 TIMES A DAY. 06/15/16   Renato Shin, MD  BAYER MICROLET LANCETS lancets Use as instructed to check blood sugar 3 times daily. Dx Code E11.65 01/19/18   Elayne Snare, MD  Blood Glucose Monitoring Suppl (ACCU-CHEK AVIVA PLUS) W/DEVICE KIT Use to check blood sugar 2 times per day dx code E11.65 10/09/14   Elayne Snare, MD  BYETTA 5 MCG PEN 5 MCG/0.02ML SOPN injection INJECT 5MCG INTO THE SKIN  TWICE DAILY BEFORE MEALS. 09/27/18   Elayne Snare, MD  gabapentin (NEURONTIN) 100 MG capsule TAKE 3 CAPSULES BY MOUTH AT BEDTIME. 11/30/17   Elayne Snare, MD  HUMALOG KWIKPEN 100 UNIT/ML KiwkPen INJECT 6 TO 7 UNITS SUBCUTANEOUSLY BEFORE SUPPER IF GLUCOSE IS HIGH. 08/27/18   Elayne Snare, MD  ibuprofen (ADVIL,MOTRIN) 200 MG tablet Take 400 mg by mouth as needed.    [provider]  Lancets Glory Rosebush ULTRASOFT) lancets  04/29/13   [provider]  LANTUS SOLOSTAR 100 UNIT/ML Solostar Pen INJECT [place]. 09/20/18   Elayne Snare, MD  loratadine (CLARITIN) 10 MG tablet Take 10 mg by mouth daily as needed for allergies.    [provider]  metFORMIN (GLUCOPHAGE) 850 MG tablet TAKE (1) TABLET BY MOUTH TWICE DAILY. 05/02/18   Elayne Snare, MD  metoprolol tartrate (LOPRESSOR) 50 MG tablet TAKE  1/2 TABLET BY MOUTH TWICE DAILY. 09/20/18   Herminio Commons, MD  Multiple Vitamin (MULTIVITAMIN) tablet Take 1 tablet by mouth once a week.     [provider]  nystatin-triamcinolone ointment (MYCOLOG) Apply 1 application topically 2 (two) times daily. 01/26/17   Estill Dooms, NP  olmesartan (BENICAR) 40 MG tablet  12/15/17   [provider]  omeprazole (PRILOSEC) 20 MG capsule Take 20 mg by mouth daily.      [provider]  polyethylene glycol powder (GLYCOLAX/MIRALAX) powder Take 1 Container by mouth as needed for moderate constipation.  09/23/13   [provider]  pravastatin (PRAVACHOL) 20 MG tablet TAKE ONE TABLET BY MOUTH DAILY. 09/20/18   Elayne Snare, MD  torsemide (DEMADEX) 20 MG tablet TAKE 1 TABLET BY MOUTH ONCE DAILY AS NEEDED. 11/20/17   Herminio Commons, MD  torsemide (DEMADEX) 20 MG tablet TAKE 1 TABLET BY MOUTH ONCE DAILY AS NEEDED. 08/20/18   Herminio Commons, MD  triamterene-hydrochlorothiazide (MAXZIDE-25) 37.5-25 MG tablet Take 1 tablet by mouth daily. 06/26/18   Elayne Snare, MD    Family History Family History  Problem Relation Age of Onset  . Diabetes Mother   . Diabetes Daughter        gestational diabetes  . Cancer Father   . Thyroid disease Brother   . Other Son        MVA    Social History Social History   Tobacco Use  . Smoking status: Former Smoker    Packs/day: 1.00    Types: Cigarettes    Start date: 11/21/1966    Last attempt to quit: 08/13/1986    Years since quitting: 32.1  . Smokeless tobacco: Never Used  Substance Use Topics  . Alcohol use: No    Alcohol/week: 0.0 standard drinks  . Drug use: No     Allergies   Latex   Review of Systems Review of Systems  All other systems reviewed and are negative.    Physical Exam Updated Vital Signs BP (!) 133/56 (BP Location: Right Arm)   Pulse 65   Temp 98.1 F (36.7 C) (Oral)   Resp 17   Ht 5' 7"  (1.702 m)   Wt 108.9 kg   SpO2 98%    BMI 37.59 kg/m   Physical Exam  Constitutional: She is oriented to person, place, and time. She appears well-developed and well-nourished.  HENT:  Head: Normocephalic and atraumatic.  Eyes: Conjunctivae are normal.  Neck: Neck supple.  Cardiovascular: Normal rate and regular rhythm.  Pulmonary/Chest: Effort normal and breath sounds normal.  Abdominal:  Soft. Bowel sounds are normal.  Musculoskeletal: Normal range of motion.  Neurological: She is alert and oriented to person, place, and time.  Skin: Skin is warm and dry.  Psychiatric: She has a normal mood and affect. Her behavior is normal.  Nursing note and vitals reviewed.    ED Treatments / Results  Labs (all labs ordered are listed, but only abnormal results are displayed) Labs Reviewed  BASIC METABOLIC PANEL - Abnormal; Notable for the following components:      Result Value   Glucose, Bld 141 (*)    GFR calc non Af Amer 58 (*)    All other components within normal limits  CBC - Abnormal; Notable for the following components:   Hemoglobin 10.7 (*)    HCT 34.9 (*)    All other components within normal limits  TROPONIN I    EKG EKG Interpretation  Date/Time:  Friday September 28 2018 18:16:22 EST Ventricular Rate:  78 PR Interval:  156 QRS Duration: 76 QT Interval:  352 QTC Calculation: 401 R Axis:   -15 Text Interpretation:  Normal sinus rhythm Left ventricular hypertrophy Nonspecific T wave abnormality Abnormal ECG Confirmed by Nat Christen [phone]) on 09/28/2018 6:56:52 PM   Radiology Dg Chest 2 View  Result Date: 09/28/2018 CLINICAL DATA:  Chest pain beginning last night. Lifting injury 3 days ago. History of atrial fibrillation. EXAM: CHEST - 2 VIEW COMPARISON:  Chest radiograph July 17, 2014 FINDINGS: Cardiomediastinal silhouette is normal. No pleural effusions or focal consolidations. Trachea projects midline and there is no pneumothorax. Soft tissue planes and included osseous structures are non-suspicious.  Mild degenerative changes thoracic spine. Surgical clips in the included right abdomen compatible with cholecystectomy. IMPRESSION: No active cardiopulmonary process. Electronically Signed   By: Elon Alas M.D.   On: 09/28/2018 19:07    Procedures Procedures (including critical care time)  Medications Ordered in ED Medications - No data to display   Initial Impression / Assessment and Plan / ED Course  I have reviewed the triage vital signs and the nursing notes.  Pertinent labs & imaging results that were available during my care of the patient were reviewed by me and considered in my medical decision making (see chart for details).     Although patient has several cardiac risk factors, her history is more suggestive of musculoskeletal pain.  Work-up including EKG, chest x-ray, troponin all negative.  Patient is hemodynamically stable at discharge.  She will follow-up with her primary care doctor.  Final Clinical Impressions(s) / ED Diagnoses   Final diagnoses:  Chest pain, unspecified type    ED Discharge Orders    None       Nat Christen, MD 09/29/18 1355

## 2018-10-01 ENCOUNTER — Ambulatory Visit: Payer: Medicare Other | Admitting: Physician Assistant

## 2018-10-04 ENCOUNTER — Other Ambulatory Visit: Payer: Self-pay | Admitting: Endocrinology

## 2018-10-04 MED ORDER — METFORMIN HCL 850 MG PO TABS: ORAL_TABLET | ORAL | 1 refills | Status: DC

## 2018-10-04 MED ORDER — PRAVASTATIN SODIUM 20 MG PO TABS: 20.0000 mg | ORAL_TABLET | Freq: Every day | ORAL | 1 refills | Status: DC

## 2018-10-04 MED ORDER — TRIAMTERENE-HCTZ 37.5-25 MG PO TABS: 1.0000 | ORAL_TABLET | Freq: Every day | ORAL | 1 refills | Status: DC

## 2018-10-04 NOTE — Telephone Encounter (Signed)
rx metformin 850mg  disp-90, R-1 [employer]

## 2018-10-04 NOTE — Telephone Encounter (Signed)
Rx pravastatin 20 mg  Disp-90,  R-1 sent to [facility].

## 2018-10-10 ENCOUNTER — Ambulatory Visit (INDEPENDENT_AMBULATORY_CARE_PROVIDER_SITE_OTHER): Payer: Medicare Other | Admitting: Endocrinology

## 2018-10-10 ENCOUNTER — Encounter: Payer: Self-pay | Admitting: Endocrinology

## 2018-10-10 VITALS — BP 142/80 | HR 66 | Ht 67.0 in | Wt 235.0 lb

## 2018-10-10 DIAGNOSIS — I10 Essential (primary) hypertension: Secondary | ICD-10-CM

## 2018-10-10 DIAGNOSIS — E1165 Type 2 diabetes mellitus with hyperglycemia: Secondary | ICD-10-CM

## 2018-10-10 DIAGNOSIS — R600 Localized edema: Secondary | ICD-10-CM | POA: Diagnosis not present

## 2018-10-10 DIAGNOSIS — R7989 Other specified abnormal findings of blood chemistry: Secondary | ICD-10-CM

## 2018-10-10 DIAGNOSIS — Z794 Long term (current) use of insulin: Secondary | ICD-10-CM | POA: Diagnosis not present

## 2018-10-10 LAB — POCT GLYCOSYLATED HEMOGLOBIN (HGB A1C): Hemoglobin A1C: 7.4 % — AB (ref 4.0–5.6)

## 2018-10-10 NOTE — Patient Instructions (Addendum)
Lantus 40 in am  Restart Olmesartan and stop amlodipine

## 2018-10-10 NOTE — Progress Notes (Signed)
  Patient ID: Brittney [name], female   DOB: 02/28/1946, 72 y.o.   MRN: [id number]   Reason for Appointment: Followup of diabetes  History of Present Illness    Type 2 DIABETES MELITUS, date of diagnosis: 1976      She has had long-standing diabetes taking insulin. Because of her difficulties with compliance using mealtime insulin she was given in Byetta in 2011 and subsequently Victoza in addition to her Lantus and this helped her control However she tends to be erratic with her day-to-day care and has not had adequate A1c levels in the past, usually over 7%  RECENT history:   Insulin regimen: Lantus 42 units daily in a.m., NovoLog 0-6 units at dinner   Non-insulin hypoglycemic drugs: taking Byetta 5 mcg before meals, Metformin   850 twice a day  Her A1c is increasing, now 8.1 and has been as low as 7.1   Current glucose patterns and problems:  She has restarted taking Byetta again after she was recommended this on her last visit when her blood sugars were higher  With this her blood sugars are improved as judged by her A1c  However she has significant variability in her blood sugars fluctuating as high as 265 and as low as 59  She thinks she takes NovoLog in the evening at dinnertime if her blood sugar is high  She does not know why her blood sugar has been as low as 59 after lunch, she does not think she has taken NovoLog at lunchtime  Overall the lowest blood sugars are before dinnertime and overnight and highest after dinner in the evening  FASTING readings are quite variable and no hypoglycemia overnight  However she does check her sugars frequently during the night and will have a snack of the blood sugar is low normal  She said that she is cutting back on portions and has lost 8 pounds  Also trying to walk when she can    Side effects from medications:  Invokana:? Headache.  Victoza ?  Swelling in neck      Monitors blood glucose: 4.3 times a day.     Glucometer: Accucheck         Blood Glucose readings from meter download:  PRE-MEAL Fasting Lunch Dinner Bedtime Overall  Glucose range:      59-265  Mean/median:  131  151  130  161  143   POST-MEAL PC Breakfast PC Lunch PC Dinner  Glucose range:     Mean/median:  143   159   Previous blood sugar average 141      Meals: 2-3 meals per day. . Dinner 7 pm, variable quantity and carbohydrates  Does have protein with breakfast consistently, has egg for protein  Physical activity: exercise: walking on and off  Wt Readings from Last 3 Encounters:  10/10/18 235 lb (106.6 kg)  09/28/18 240 lb (108.9 kg)  06/26/18 243 lb (110.2 kg)   GLYCEMIC CONTROL:  Lab Results  Component Value Date   HGBA1C 7.4 (A) 10/10/2018   HGBA1C 8.1 (A) 06/26/2018   HGBA1C 7.5 03/26/2018   Lab Results  Component Value Date   MICROALBUR 2.0 (H) 03/26/2018   LDLCALC 92 12/27/2017   CREATININE 0.96 09/28/2018      Other problems discussed today: See review of systems   Allergies as of 10/10/2018      Reactions   Latex    Itching and breaking out      Medication List          Accurate as of 10/10/18  9:38 AM. Always use your most recent med list.          ACCU-CHEK AVIVA PLUS test strip Generic drug:  glucose blood USE TO TEST TWICE DAILY.   ACCU-CHEK AVIVA PLUS w/Device Kit Use to check blood sugar 2 times per day dx code E11.65   ALPRAZolam 0.5 MG tablet Commonly known as:  XANAX Take 0.5 mg by mouth at bedtime. Takes half a tab at bedtime   amLODipine 10 MG tablet Commonly known as:  NORVASC TAKE (1) TABLET BY MOUTH ONCE DAILY.   B-D ULTRAFINE III SHORT PEN 31G X 8 MM Misc Generic drug:  Insulin Pen Needle USE AS DIRECTED 2-3 TIMES A DAY.   BYETTA 5 MCG PEN 5 MCG/0.02ML Sopn injection Generic drug:  exenatide INJECT 5MCG INTO THE SKIN TWICE DAILY BEFORE MEALS.   gabapentin 100 MG capsule Commonly known as:  NEURONTIN TAKE 3 CAPSULES BY MOUTH AT BEDTIME.   HUMALOG  KWIKPEN 100 UNIT/ML KwikPen Generic drug:  insulin lispro INJECT 6 TO 7 UNITS SUBCUTANEOUSLY BEFORE SUPPER IF GLUCOSE IS HIGH.   ibuprofen 200 MG tablet Commonly known as:  ADVIL,MOTRIN Take 400 mg by mouth as needed.   LANTUS SOLOSTAR 100 UNIT/ML Solostar Pen Generic drug:  Insulin Glargine INJECT 44 UNITS SUBCUTANEOUSLY ONCE EVERY MORNING.   loratadine 10 MG tablet Commonly known as:  CLARITIN Take 10 mg by mouth daily as needed for allergies.   metFORMIN 850 MG tablet Commonly known as:  GLUCOPHAGE TAKE (1) TABLET BY MOUTH TWICE DAILY.   metoprolol tartrate 50 MG tablet Commonly known as:  LOPRESSOR TAKE 1/2 TABLET BY MOUTH TWICE DAILY.   multivitamin tablet Take 1 tablet by mouth once a week.   nystatin-triamcinolone ointment Commonly known as:  MYCOLOG Apply 1 application topically 2 (two) times daily.   olmesartan 40 MG tablet Commonly known as:  BENICAR   omeprazole 20 MG capsule Commonly known as:  PRILOSEC Take 20 mg by mouth daily.   onetouch ultrasoft lancets   BAYER MICROLET LANCETS lancets Use as instructed to check blood sugar 3 times daily. Dx Code E11.65   polyethylene glycol powder powder Commonly known as:  GLYCOLAX/MIRALAX Take 1 Container by mouth as needed for moderate constipation.   pravastatin 20 MG tablet Commonly known as:  PRAVACHOL Take 1 tablet (20 mg total) by mouth daily.   torsemide 20 MG tablet Commonly known as:  DEMADEX TAKE 1 TABLET BY MOUTH ONCE DAILY AS NEEDED.   torsemide 20 MG tablet Commonly known as:  DEMADEX TAKE 1 TABLET BY MOUTH ONCE DAILY AS NEEDED.   triamterene-hydrochlorothiazide 37.5-25 MG tablet Commonly known as:  MAXZIDE-25 Take 1 tablet by mouth daily.       Allergies:  Allergies  Allergen Reactions  . Latex     Itching and breaking out    Past Medical History:  Diagnosis Date  . Atrial fibrillation (HCC)    Paroxysmal; LVH; nl EF; onset in 1999  . Burning with urination 02/23/2016  .  Diabetes mellitus    insulin; managed by Dr. Dwyane Dee  . 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 Hyperlipidemia   . Hypertension   . Hypothyroidism    history of goiter; nl TSH off medication  . Itching with irritation 02/27/2015  . Obesity 07/06/2012  . Palpitations    negative event recorder in 2009  . UTI (urinary tract infection) 02/23/2016    Past Surgical History:  Procedure Laterality Date  . BIOPSY THYROID    . CESAREAN SECTION    . CHOLECYSTECTOMY  11/95  . COLONOSCOPY  2007  . RETINAL DETACHMENT SURGERY  2009  . TUBAL LIGATION  1980's    Family History  Problem Relation Age of Onset  . Diabetes Mother   . Diabetes Daughter        gestational diabetes  . Cancer Father   . Thyroid disease Brother   . Other Son        MVA    Social History:  reports that she quit smoking about 32 years ago. Her smoking use included cigarettes. She started smoking about 51 years ago. She smoked 1.00 pack per day. She has never used smokeless tobacco. She reports that she does not drink alcohol or use drugs.  Review of Systems:   HYPERTENSION:  Followed by PCP, previously controlled on Norvasc and Benicar She says she checks her blood pressure at home and she thinks it is between 086-578 systolic Because of her tendency to edema she was told to reduce her amlodipine in half and try Maxide 25 mg daily since she does not tolerate Demadex  Blood pressure is improved but she apparently by mistake has not taken her Benicar at all but is still taking half Norvasc  Edema: She takes no  Demadex  does not like to take this because of muscle cramps Edema is much better recently  HYPERLIPIDEMIA: The lipid abnormality consists of elevated LDL; she has been getting good results with Pravachol   Lab Results  Component Value Date   CHOL 165 12/27/2017   HDL 60.40 12/27/2017   LDLCALC 92 12/27/2017   TRIG 65.0 12/27/2017   CHOLHDL 3 12/27/2017    MULTINODULAR  GOITER: She has had a long-standing multinodular goiter since at least 2005 which has been fairly stable clinically She does have history of occasional palpitations for years Metoprolol has been prescribed by cardiologist  No local pressure symptoms of choking or difficulty swallowing but she thinks that at times her goiter is swollen more  Her goiter has been autonomous with no evidence of overt hyperthyroidism  with normal free T4 and free T3 levels; TSH has been consistently suppressed  Her I-131 uptake in 2003 was 17% and her  thyroid ultrasound in 2014 did not show distinct nodules but a relative increase in size  Lab Results  Component Value Date   TSH 0.10 (L) 03/26/2018   TSH 0.29 (L) 09/08/2017   FREET4 0.78 03/26/2018   FREET4 0.79 09/08/2017   Lab Results  Component Value Date   T3FREE 3.0 03/26/2018   T3FREE 3.2 09/08/2017   T3FREE 3.0 06/08/2017           Examination:   BP (!) 142/80   Pulse 66   Ht 5' 7" (1.702 m)   Wt 235 lb (106.6 kg)   SpO2 97%   BMI 36.81 kg/m   Body mass index is 36.81 kg/m.     ASSESSMENT/ PLAN:  Diabetes Type 2 on insulin, Byetta and metformin  See history of present illness for detailed discussion of  current management, blood sugar patterns and problems identified  Her A1c is significantly better 7.4 compared to 8.1  She has benefited from restarting Byetta and is losing weight also  However she is showing significant variability in her blood sugars are also not clear why she is having occasional hypoglycemia in the afternoon or early evening even though she is not in  NovoLog before lunch Lowest blood sugars appear to be overnight  Recommendations:  Continue Byetta She can reduce her Lantus by 2 units and further if she has low normal readings overnight Needs to have balanced meals to reduce variability in her blood sugars To call if she is having low blood sugars but to take NovoLog only with eating a relatively large  meals in the evenings  Consider consultation with dietitian Regular walking to be continued   MULTINODULAR goiter: Needs periodic follow-up of thyroid function  EDEMA: Improved with Maxide  Hypertension: Blood pressure is currently well-controlled even though she is not taking her Benicar Since she tends to have edema with amlodipine she can stop this and resume Benicar Continue to monitor at home and follow-up with PCP  LIPIDS: Will need follow-up on the next visit   Counseling time on subjects discussed in assessment and plan sections is over 50% of today's 25 minute visit  There are no Patient Instructions on file for this visit.    Ajay Kumar 10/10/2018, 9:38 AM     

## 2018-10-12 ENCOUNTER — Encounter: Payer: Self-pay | Admitting: Gastroenterology

## 2018-10-15 DIAGNOSIS — E119 Type 2 diabetes mellitus without complications: Secondary | ICD-10-CM | POA: Diagnosis not present

## 2018-10-15 DIAGNOSIS — I998 Other disorder of circulatory system: Secondary | ICD-10-CM | POA: Diagnosis not present

## 2018-10-15 DIAGNOSIS — E118 Type 2 diabetes mellitus with unspecified complications: Secondary | ICD-10-CM | POA: Diagnosis not present

## 2018-10-15 DIAGNOSIS — E1140 Type 2 diabetes mellitus with diabetic neuropathy, unspecified: Secondary | ICD-10-CM | POA: Diagnosis not present

## 2018-10-30 ENCOUNTER — Telehealth: Payer: Self-pay | Admitting: Endocrinology

## 2018-10-30 NOTE — Telephone Encounter (Signed)
[facility] called regarding sensor supply refill

## 2018-10-30 NOTE — Telephone Encounter (Signed)
Called [facility] and inquired about what information is needed for pt. Brittney [name] stated that they needed clinical notes. They were informed that these notes were faxed yesterday.

## 2018-11-07 ENCOUNTER — Other Ambulatory Visit: Payer: Self-pay | Admitting: Cardiovascular Disease

## 2018-11-07 ENCOUNTER — Other Ambulatory Visit: Payer: Self-pay | Admitting: Endocrinology

## 2018-11-28 ENCOUNTER — Other Ambulatory Visit: Payer: Self-pay | Admitting: Endocrinology

## 2018-12-03 DIAGNOSIS — I10 Essential (primary) hypertension: Secondary | ICD-10-CM | POA: Diagnosis not present

## 2018-12-03 DIAGNOSIS — E1122 Type 2 diabetes mellitus with diabetic chronic kidney disease: Secondary | ICD-10-CM | POA: Diagnosis not present

## 2018-12-03 DIAGNOSIS — N183 Chronic kidney disease, stage 3 (moderate): Secondary | ICD-10-CM | POA: Diagnosis not present

## 2018-12-06 ENCOUNTER — Other Ambulatory Visit: Payer: Self-pay | Admitting: Endocrinology

## 2018-12-13 ENCOUNTER — Ambulatory Visit: Payer: Medicare Other | Admitting: Gastroenterology

## 2018-12-24 ENCOUNTER — Other Ambulatory Visit: Payer: Self-pay | Admitting: Endocrinology

## 2019-01-09 ENCOUNTER — Other Ambulatory Visit: Payer: Self-pay | Admitting: Endocrinology

## 2019-01-11 ENCOUNTER — Ambulatory Visit: Payer: Medicare Other | Admitting: Endocrinology

## 2019-01-11 ENCOUNTER — Telehealth: Payer: Self-pay | Admitting: Endocrinology

## 2019-01-11 NOTE — Telephone Encounter (Signed)
Called patient PM of 01/10/2019 to advise of late opening due to inclement weather.  Advised that we would call to reschedule.   2nd call made 01/11/2019 to reschedule no answer, so left message for patient to call us back at their convenience to reschedule

## 2019-01-28 ENCOUNTER — Other Ambulatory Visit: Payer: Self-pay | Admitting: Endocrinology

## 2019-02-22 ENCOUNTER — Other Ambulatory Visit: Payer: Self-pay | Admitting: Endocrinology

## 2019-04-03 ENCOUNTER — Other Ambulatory Visit: Payer: Self-pay | Admitting: Endocrinology

## 2019-04-03 ENCOUNTER — Other Ambulatory Visit: Payer: Self-pay | Admitting: Cardiovascular Disease

## 2019-04-09 ENCOUNTER — Other Ambulatory Visit: Payer: Self-pay | Admitting: Endocrinology

## 2019-04-12 ENCOUNTER — Ambulatory Visit: Payer: Medicare Other | Admitting: Endocrinology

## 2019-04-12 ENCOUNTER — Other Ambulatory Visit: Payer: Self-pay

## 2019-04-13 DIAGNOSIS — T25222A Burn of second degree of left foot, initial encounter: Secondary | ICD-10-CM | POA: Diagnosis not present

## 2019-04-13 DIAGNOSIS — E1142 Type 2 diabetes mellitus with diabetic polyneuropathy: Secondary | ICD-10-CM | POA: Diagnosis not present

## 2019-04-24 DIAGNOSIS — S91109A Unspecified open wound of unspecified toe(s) without damage to nail, initial encounter: Secondary | ICD-10-CM | POA: Diagnosis not present

## 2019-04-24 DIAGNOSIS — L97522 Non-pressure chronic ulcer of other part of left foot with fat layer exposed: Secondary | ICD-10-CM | POA: Diagnosis not present

## 2019-04-24 DIAGNOSIS — I739 Peripheral vascular disease, unspecified: Secondary | ICD-10-CM | POA: Diagnosis not present

## 2019-04-24 DIAGNOSIS — M21612 Bunion of left foot: Secondary | ICD-10-CM | POA: Diagnosis not present

## 2019-05-08 ENCOUNTER — Other Ambulatory Visit: Payer: Self-pay | Admitting: Cardiovascular Disease

## 2019-05-08 DIAGNOSIS — L97522 Non-pressure chronic ulcer of other part of left foot with fat layer exposed: Secondary | ICD-10-CM | POA: Diagnosis not present

## 2019-05-08 DIAGNOSIS — L97521 Non-pressure chronic ulcer of other part of left foot limited to breakdown of skin: Secondary | ICD-10-CM | POA: Diagnosis not present

## 2019-05-13 ENCOUNTER — Other Ambulatory Visit: Payer: Self-pay | Admitting: Endocrinology

## 2019-05-22 DIAGNOSIS — L97522 Non-pressure chronic ulcer of other part of left foot with fat layer exposed: Secondary | ICD-10-CM | POA: Diagnosis not present

## 2019-05-23 ENCOUNTER — Encounter: Payer: Self-pay | Admitting: Endocrinology

## 2019-05-23 ENCOUNTER — Other Ambulatory Visit: Payer: Self-pay

## 2019-05-23 ENCOUNTER — Ambulatory Visit (INDEPENDENT_AMBULATORY_CARE_PROVIDER_SITE_OTHER): Payer: Medicare Other | Admitting: Endocrinology

## 2019-05-23 DIAGNOSIS — Z794 Long term (current) use of insulin: Secondary | ICD-10-CM | POA: Diagnosis not present

## 2019-05-23 DIAGNOSIS — E1165 Type 2 diabetes mellitus with hyperglycemia: Secondary | ICD-10-CM

## 2019-05-23 NOTE — Progress Notes (Signed)
Patient ID: Brittney [name], female   DOB: Oct 13, 1946, 73 y.o.   MRN: [id number]  Today's office visit was provided via telemedicine using video technique The patient was explained the limitations of evaluation and management by telemedicine and the availability of in person appointments.  The patient understood the limitations and agreed to proceed. Patient also understood that the telehealth visit is billable. . Location of the patient: Patient's home . Location of the provider: Physician office Only the patient and myself were participating in the encounter    Reason for Appointment: Followup of diabetes  History of Present Illness    Type 2 DIABETES MELITUS, date of diagnosis: 1976      She has had long-standing diabetes taking insulin. Because of her difficulties with compliance using mealtime insulin she was given in Byetta in 2011 and subsequently Victoza in addition to her Lantus and this helped her control However she tends to be erratic with her day-to-day care and has not had adequate A1c levels in the past, usually over 7%  RECENT history:   Insulin regimen: Lantus 40 units daily in a.m., NovoLog 0-6 units as needed  Non-insulin hypoglycemic drugs: taking Byetta 5 mcg before meals, Metformin   850 twice a day  Her last A1c and office visit was 09/2018  Current glucose patterns and problems:  She has still not understood the need to take NovoLog when she is eating larger meals or significant carbohydrate  Also not checking blood sugars much after meals and mostly in the morning  However she thinks she is taking the Byetta twice a day  Not clear if she has had any consistent weight change, she thinks it fluctuates  FASTING readings are generally fairly good with her current dose of Lantus that she takes in the morning  Because of problems with her feet she is not able to do much walking these days  Highest blood sugar was 225 when she went out to eat at  a restaurant and had potatoes  Also has an unusually high reading early morning of 176  She has had only rare episodes of hypoglycemia and not clear why this happens    Side effects from medications:  Invokana:? Headache.  Victoza ?  Swelling in neck      Monitors blood glucose: Mostly once occasionally twice a day   Glucometer: Accucheck         Blood Glucose readings from patient history as follows:  FASTING range 91-136 before breakfast  Evening 149, 225 Overnight 111, 176 Average not available    Meals: 2-3 meals per day. . Dinner 7 pm, variable quantity and carbohydrates  Does have protein with breakfast consistently, has egg for protein  Physical activity: exercise: walking  less recently  Wt Readings from Last 3 Encounters:  10/10/18 235 lb (106.6 kg)  09/28/18 240 lb (108.9 kg)  06/26/18 243 lb (110.2 kg)   GLYCEMIC CONTROL:  Lab Results  Component Value Date   HGBA1C 7.4 (A) 10/10/2018   HGBA1C 8.1 (A) 06/26/2018   HGBA1C 7.5 03/26/2018   Lab Results  Component Value Date   MICROALBUR 2.0 (H) 03/26/2018   [place] 92 12/27/2017   CREATININE 0.96 09/28/2018      Other problems discussed today: See review of systems   Allergies as of 05/23/2019      Reactions   Latex    Itching and breaking out      Medication List       Accurate  as of May 23, 2019  9:59 AM. If you have any questions, ask your nurse or doctor.        Accu-Chek Aviva Plus test strip Generic drug: glucose blood USE TO TEST TWICE DAILY.   Accu-Chek Aviva Plus w/Device Kit Use to check blood sugar 2 times per day dx code E11.65   ALPRAZolam 0.5 MG tablet Commonly known as: XANAX Take 0.5 mg by mouth at bedtime. Takes half a tab at bedtime   amLODipine 10 MG tablet Commonly known as: NORVASC TAKE (1) TABLET BY MOUTH ONCE DAILY.   B-D ULTRAFINE III SHORT PEN 31G X 8 MM Misc Generic drug: Insulin Pen Needle USE AS DIRECTED 2-3 TIMES A DAY.   Byetta 5 MCG Pen 5 MCG/0.02ML  Sopn injection Generic drug: exenatide INJECT 5MCG INTO THE SKIN TWICE DAILY BEFORE MEALS.   gabapentin 100 MG capsule Commonly known as: NEURONTIN TAKE 3 CAPSULES BY MOUTH AT BEDTIME.   HumaLOG KwikPen 100 UNIT/ML KwikPen Generic drug: insulin lispro INJECT 6 TO 7 UNITS SUBCUTANEOUSLY BEFORE SUPPER IF GLUCOSE IS HIGH.   ibuprofen 200 MG tablet Commonly known as: ADVIL Take 400 mg by mouth as needed.   Lantus SoloStar 100 UNIT/ML Solostar Pen Generic drug: Insulin Glargine INJECT 44 UNITS SUBCUTANEOUSLY ONCE EVERY MORNING.   loratadine 10 MG tablet Commonly known as: CLARITIN Take 10 mg by mouth daily as needed for allergies.   metFORMIN 850 MG tablet Commonly known as: GLUCOPHAGE TAKE (1) TABLET BY MOUTH TWICE DAILY.   metoprolol tartrate 50 MG tablet Commonly known as: LOPRESSOR TAKE 1/2 TABLET BY MOUTH TWICE DAILY.   multivitamin tablet Take 1 tablet by mouth once a week.   nystatin-triamcinolone ointment Commonly known as: MYCOLOG Apply 1 application topically 2 (two) times daily.   olmesartan 40 MG tablet Commonly known as: BENICAR   omeprazole 20 MG capsule Commonly known as: PRILOSEC Take 20 mg by mouth daily.   onetouch Engineer, agricultural Lancets lancets Use as instructed to check blood sugar 3 times daily. Dx Code E11.65   polyethylene glycol powder 17 GM/SCOOP powder Commonly known as: GLYCOLAX/MIRALAX Take 1 Container by mouth as needed for moderate constipation.   pravastatin 20 MG tablet Commonly known as: PRAVACHOL TAKE ONE TABLET BY MOUTH DAILY.   torsemide 20 MG tablet Commonly known as: DEMADEX TAKE 1 TABLET BY MOUTH ONCE DAILY AS NEEDED.   torsemide 20 MG tablet Commonly known as: DEMADEX TAKE 1 TABLET BY MOUTH ONCE DAILY AS NEEDED.   torsemide 20 MG tablet Commonly known as: DEMADEX TAKE 1 TABLET BY MOUTH ONCE DAILY AS NEEDED.   triamterene-hydrochlorothiazide 37.5-25 MG tablet Commonly known as: MAXZIDE-25  TAKE 1 TABLET BY MOUTH ONCE A DAY.       Allergies:  Allergies  Allergen Reactions  . Latex     Itching and breaking out    Past Medical History:  Diagnosis Date  . Atrial fibrillation (HCC)    Paroxysmal; LVH; nl EF; onset in 1999  . Burning with urination 02/23/2016  . Diabetes mellitus    insulin; managed by Dr. Dwyane Dee  . 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 Hyperlipidemia   . Hypertension   . Hypothyroidism    history of goiter; nl TSH off medication  . Itching with irritation 02/27/2015  . Obesity 07/06/2012  . Palpitations    negative event recorder in 2009  . UTI (urinary tract infection) 02/23/2016    Past Surgical  History:  Procedure Laterality Date  . BIOPSY THYROID    . CESAREAN SECTION    . CHOLECYSTECTOMY  11/95  . COLONOSCOPY  2007  . RETINAL DETACHMENT SURGERY  2009  . TUBAL LIGATION  1980's    Family History  Problem Relation Age of Onset  . Diabetes Mother   . Diabetes Daughter        gestational diabetes  . Cancer Father   . Thyroid disease Brother   . Other Son        MVA    Social History:  reports that she quit smoking about 32 years ago. Her smoking use included cigarettes. She started smoking about 52 years ago. She smoked 1.00 pack per day. She has never used smokeless tobacco. She reports that she does not drink alcohol or use drugs.  Review of Systems:   HYPERTENSION:  Followed by PCP, usually controlled on Norvasc and Benicar She has checked her blood pressure at home periodically, last reading 134/72  Last visit with PCP was in 5/20  HYPERLIPIDEMIA: The lipid abnormality consists of elevated LDL; she has been getting good results with Pravachol   Lab Results  Component Value Date   CHOL 165 12/27/2017   HDL 60.40 12/27/2017   LDLCALC 92 12/27/2017   TRIG 65.0 12/27/2017   CHOLHDL 3 12/27/2017    MULTINODULAR GOITER: She has had a long-standing multinodular goiter since at least 2005 which has  been fairly stable clinically She does have history of occasional palpitations for years Metoprolol has been prescribed by cardiologist  She has no local pressure symptoms in the neck or difficulty swallowing  Her goiter has been autonomous with no evidence of overt hyperthyroidism  with normal free T4 and free T3 levels; TSH has been consistently suppressed No recent labs available  Her I-131 uptake in 2003 was 17% and her  thyroid ultrasound in 2014 did not show distinct nodules but a relative increase in size  Lab Results  Component Value Date   TSH 0.10 (L) 03/26/2018   TSH 0.29 (L) 09/08/2017   FREET4 0.78 03/26/2018   FREET4 0.79 09/08/2017   Lab Results  Component Value Date   T3FREE 3.0 03/26/2018   T3FREE 3.2 09/08/2017   T3FREE 3.0 06/08/2017           Examination:   There were no vitals taken for this visit.  There is no height or weight on file to calculate BMI.     ASSESSMENT/ PLAN:  Diabetes Type 2 with obesity on insulin, Byetta and metformin  See history of present illness for detailed discussion of  current management, blood sugar patterns and problems identified  Her A1c is pending, last 7.4  Although she probably has better blood sugar readings after being on Byetta she is still having some randomly high readings based on her carbohydrate intake and meal size Fasting readings are generally stable with Lantus  Recommendations:  Continue Byetta twice a day If she is eating a significant amount of carbohydrate like potatoes, rice or bread she will take 4 to 6 units of NovoLog before the meal to avoid hypoglycemia but not take any NovoLog after meals Restart walking when able to She will go to a local lab to have her A1c and other labs checked   MULTINODULAR goiter: Needs follow-up of thyroid function   Hypertension: Blood pressure is currently well-controlled with home measurements  LIPIDS: Will need follow-up on next labs     There are no  Patient Instructions on file for this visit.    Elayne Snare 05/23/2019, 9:59 AM

## 2019-05-28 ENCOUNTER — Other Ambulatory Visit: Payer: Self-pay

## 2019-05-28 ENCOUNTER — Other Ambulatory Visit (INDEPENDENT_AMBULATORY_CARE_PROVIDER_SITE_OTHER): Payer: Medicare Other

## 2019-05-28 DIAGNOSIS — R7989 Other specified abnormal findings of blood chemistry: Secondary | ICD-10-CM

## 2019-05-28 DIAGNOSIS — E1165 Type 2 diabetes mellitus with hyperglycemia: Secondary | ICD-10-CM

## 2019-05-28 DIAGNOSIS — Z794 Long term (current) use of insulin: Secondary | ICD-10-CM

## 2019-05-28 LAB — COMPREHENSIVE METABOLIC PANEL
ALT: 11 U/L (ref 0–35)
AST: 12 U/L (ref 0–37)
Albumin: 4 g/dL (ref 3.5–5.2)
Alkaline Phosphatase: 103 U/L (ref 39–117)
BUN: 18 mg/dL (ref 6–23)
CO2: 23 mEq/L (ref 19–32)
Calcium: 9.1 mg/dL (ref 8.4–10.5)
Chloride: 107 mEq/L (ref 96–112)
Creatinine, Ser: 1.2 mg/dL (ref 0.40–1.20)
GFR: 53.43 mL/min — ABNORMAL LOW (ref >60.00)
Glucose, Bld: 159 mg/dL — ABNORMAL HIGH (ref 70–99)
Potassium: 5 mEq/L (ref 3.5–5.1)
Sodium: 138 mEq/L (ref 135–145)
Total Bilirubin: 0.3 mg/dL (ref 0.2–1.2)
Total Protein: 7.2 g/dL (ref 6.0–8.3)

## 2019-05-28 LAB — LIPID PANEL
Cholesterol: 163 mg/dL (ref 0–200)
HDL: 62.9 mg/dL (ref >39.00)
LDL Cholesterol: 78 mg/dL (ref 0–99)
NonHDL: 100.23
Total CHOL/HDL Ratio: 3
Triglycerides: 113 mg/dL (ref 0.0–149.0)
VLDL: 22.6 mg/dL (ref 0.0–40.0)

## 2019-05-28 LAB — TSH: TSH: 0.03 u[IU]/mL — ABNORMAL LOW (ref 0.35–4.50)

## 2019-05-28 LAB — T3, FREE: T3, Free: 3.1 pg/mL (ref 2.3–4.2)

## 2019-05-28 LAB — T4, FREE: Free T4: 0.81 ng/dL (ref 0.60–1.60)

## 2019-05-29 NOTE — Progress Notes (Signed)
Please call to let patient know that the lab results are normal and no further action needed.  Please make follow-up appointment for 3 months, same day labs

## 2019-06-05 ENCOUNTER — Other Ambulatory Visit: Payer: Self-pay | Admitting: Endocrinology

## 2019-06-05 DIAGNOSIS — L97522 Non-pressure chronic ulcer of other part of left foot with fat layer exposed: Secondary | ICD-10-CM | POA: Diagnosis not present

## 2019-06-19 DIAGNOSIS — L97521 Non-pressure chronic ulcer of other part of left foot limited to breakdown of skin: Secondary | ICD-10-CM | POA: Diagnosis not present

## 2019-06-24 ENCOUNTER — Other Ambulatory Visit: Payer: Self-pay

## 2019-06-24 ENCOUNTER — Encounter (INDEPENDENT_AMBULATORY_CARE_PROVIDER_SITE_OTHER): Payer: Medicare Other | Admitting: Ophthalmology

## 2019-06-24 DIAGNOSIS — S81809D Unspecified open wound, unspecified lower leg, subsequent encounter: Secondary | ICD-10-CM

## 2019-06-26 ENCOUNTER — Ambulatory Visit (HOSPITAL_COMMUNITY)
Admission: RE | Admit: 2019-06-26 | Discharge: 2019-06-26 | Disposition: A | Discharge status: Home / Self Care | Payer: Medicare Other | Source: Ambulatory Visit | Attending: Family | Admitting: Family

## 2019-06-26 ENCOUNTER — Ambulatory Visit (INDEPENDENT_AMBULATORY_CARE_PROVIDER_SITE_OTHER): Payer: Medicare Other | Admitting: Vascular Surgery

## 2019-06-26 ENCOUNTER — Encounter: Payer: Self-pay | Admitting: Vascular Surgery

## 2019-06-26 ENCOUNTER — Other Ambulatory Visit: Payer: Self-pay

## 2019-06-26 VITALS — BP 114/63 | HR 83 | Temp 97.6°F | Resp 20 | Ht 67.0 in | Wt 229.0 lb

## 2019-06-26 DIAGNOSIS — S91102A Unspecified open wound of left great toe without damage to nail, initial encounter: Secondary | ICD-10-CM

## 2019-06-26 DIAGNOSIS — I7025 Atherosclerosis of native arteries of other extremities with ulceration: Secondary | ICD-10-CM

## 2019-06-26 DIAGNOSIS — S81809D Unspecified open wound, unspecified lower leg, subsequent encounter: Secondary | ICD-10-CM | POA: Diagnosis not present

## 2019-06-26 NOTE — Progress Notes (Signed)
REASON FOR CONSULT:    Slowly healing the wound.  The consult is requested by Dr. Inocencio Homes.  ASSESSMENT & PLAN:   PERIPHERAL VASCULAR DISEASE WITH ULCERATION LEFT GREAT TOE: This patient does have evidence of infrainguinal arterial occlusive disease on exam.  However she has a palpable posterior tibial pulse with a triphasic posterior tibial signal.  ABI is 100% which is likely falsely elevated however toe pressure on the left is 116 mmHg which does suggest adequate circulation for healing.  In addition at this point the wound on the toe is almost completely healed.  X-ray shows no evidence of osteomyelitis.  For all these reasons I have a not recommended that we proceed with arteriography unless the wound stops making progress or if she develops new wounds.  She is not a smoker.  I encouraged her to stay as active as possible.  I will be happy to see her back at any time if the wound fails to completely heal.  She is followed closely by Dr. Barkley Bruns.  [name], MD, FACS Beeper [phone] Office: [phone]  HPI:   Brittney [name] is a pleasant 73 y.o. female, who was referred with a slowly healing foot wound.  I have reviewed the records from the referring office.  The patient was seen on 04/24/2019.  The patient had multiple wounds on the left foot.  On exam there was no palpable pulses on the left.  X-ray of the left foot showed good bone density and no evidence of osteomyelitis.  Patient does have a history of diabetes.  Given the diminished pulses the patient was sent for vascular consultation.  Patient developed a wound on the left great toe in April.  She thinks she may have dropped something on her toe.  She does describe some mild calf claudication.  The symptoms have been stable.  She denies any history of rest pain.  She does get some cramps in her calves at night.  Her risk factors for peripheral vascular disease include type 2 diabetes, hypertension, hypercholesterolemia, and  a remote history of tobacco use.  She denies any family history of premature cardiovascular disease.  Past Medical History:  Diagnosis Date   Atrial fibrillation (HCC)    Paroxysmal; LVH; nl EF; onset in 1999   Burning with urination 02/23/2016   Diabetes mellitus    insulin; managed by Dr. 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   Hyperlipidemia    Hypertension    Hypothyroidism    history of goiter; nl TSH off medication   Itching with irritation 02/27/2015   Obesity 07/06/2012   Palpitations    negative event recorder in 2009   UTI (urinary tract infection) 02/23/2016    Family History  Problem Relation Age of Onset   Diabetes Mother    Diabetes Daughter        gestational diabetes   Cancer Father    Thyroid disease Brother    Other Son        MVA    SOCIAL HISTORY: Social History   Socioeconomic History   Marital status: Married    Spouse name: Not on file   Number of children: Not on file   Years of education: Not on file   Highest education level: Not on file  Occupational History   Occupation: Retired  Scientist, product/process development strain: Not on file   Food insecurity    Worry: Not on  file    Inability: Not on file   Transportation needs    Medical: Not on file    Non-medical: Not on file  Tobacco Use   Smoking status: Former Smoker    Packs/day: 1.00    Types: Cigarettes    Start date: 11/21/1966    Quit date: 08/13/1986    Years since quitting: 32.8   Smokeless tobacco: Never Used  Substance and Sexual Activity   Alcohol use: No    Alcohol/week: 0.0 standard drinks   Drug use: No   Sexual activity: Never    Birth control/protection: Post-menopausal  Lifestyle   Physical activity    Days per week: Not on file    Minutes per session: Not on file   Stress: Not on file  Relationships   Social connections    Talks on phone: Not on file    Gets together: Not on file    Attends  religious service: Not on file    Active member of club or organization: Not on file    Attends meetings of clubs or organizations: Not on file    Relationship status: Not on file   Intimate partner violence    Fear of current or ex partner: Not on file    Emotionally abused: Not on file    Physically abused: Not on file    Forced sexual activity: Not on file  Other Topics Concern   Not on file  Social History Narrative   No regular exercise    Allergies  Allergen Reactions   Latex     Itching and breaking out    Current Outpatient Medications  Medication Sig Dispense Refill   ACCU-CHEK AVIVA PLUS test strip USE TO TEST TWICE DAILY. 100 each 5   ALPRAZolam (XANAX) 0.5 MG tablet Take 0.5 mg by mouth at bedtime. Takes half a tab at bedtime     amLODipine (NORVASC) 10 MG tablet TAKE (1) TABLET BY MOUTH ONCE DAILY. 30 tablet 11   B-D ULTRAFINE III SHORT PEN 31G X 8 MM MISC USE AS DIRECTED 2-3 TIMES A DAY. 100 each 11   BAYER MICROLET LANCETS lancets Use as instructed to check blood sugar 3 times daily. Dx Code E11.65 100 each 3   Blood Glucose Monitoring Suppl (ACCU-CHEK AVIVA PLUS) W/DEVICE KIT Use to check blood sugar 2 times per day dx code E11.65 1 kit 0   BYETTA 5 MCG PEN 5 MCG/0.02ML SOPN injection INJECT 5MCG INTO THE SKIN TWICE DAILY BEFORE MEALS. 1.2 mL 0   gabapentin (NEURONTIN) 100 MG capsule TAKE 3 CAPSULES BY MOUTH AT BEDTIME. 90 capsule 0   HUMALOG KWIKPEN 100 UNIT/ML KwikPen INJECT 6 TO 7 UNITS SUBCUTANEOUSLY BEFORE SUPPER IF GLUCOSE IS HIGH. 15 mL 0   ibuprofen (ADVIL,MOTRIN) 200 MG tablet Take 400 mg by mouth as needed.     Lancets (ONETOUCH ULTRASOFT) lancets      LANTUS SOLOSTAR 100 UNIT/ML Solostar Pen INJECT 44 UNITS SUBCUTANEOUSLY ONCE EVERY MORNING. 15 mL 0   loratadine (CLARITIN) 10 MG tablet Take 10 mg by mouth daily as needed for allergies.     metFORMIN (GLUCOPHAGE) 850 MG tablet TAKE (1) TABLET BY MOUTH TWICE DAILY. 180 tablet 0    metoprolol tartrate (LOPRESSOR) 50 MG tablet TAKE 1/2 TABLET BY MOUTH TWICE DAILY. 7 tablet 0   Multiple Vitamin (MULTIVITAMIN) tablet Take 1 tablet by mouth once a week.      nystatin-triamcinolone ointment (MYCOLOG) Apply 1 application topically 2 (two) times daily.  30 g 2   olmesartan (BENICAR) 40 MG tablet      omeprazole (PRILOSEC) 20 MG capsule Take 20 mg by mouth daily.       polyethylene glycol powder (GLYCOLAX/MIRALAX) powder Take 1 Container by mouth as needed for moderate constipation.      pravastatin (PRAVACHOL) 20 MG tablet TAKE ONE TABLET BY MOUTH DAILY. 90 tablet 0   SANTYL ointment      torsemide (DEMADEX) 20 MG tablet TAKE 1 TABLET BY MOUTH ONCE DAILY AS NEEDED. 30 tablet 9   torsemide (DEMADEX) 20 MG tablet TAKE 1 TABLET BY MOUTH ONCE DAILY AS NEEDED. 30 tablet 3   torsemide (DEMADEX) 20 MG tablet TAKE 1 TABLET BY MOUTH ONCE DAILY AS NEEDED. 90 tablet 3   triamterene-hydrochlorothiazide (MAXZIDE-25) 37.5-25 MG tablet TAKE 1 TABLET BY MOUTH ONCE A DAY. 90 tablet 0   No current facility-administered medications for this visit.     REVIEW OF SYSTEMS:  [X]  denotes positive finding, [ ]  denotes negative finding Cardiac  Comments:  Chest pain or chest pressure:    Shortness of breath upon exertion:    Short of breath when lying flat:    Irregular heart rhythm:        Vascular    Pain in calf, thigh, or hip brought on by ambulation:    Pain in feet at night that wakes you up from your sleep:  x   Blood clot in your veins:    Leg swelling:         Pulmonary    Oxygen at home:    Productive cough:     Wheezing:         Neurologic    Sudden weakness in arms or legs:     Sudden numbness in arms or legs:     Sudden onset of difficulty speaking or slurred speech:    Temporary loss of vision in one eye:     Problems with dizziness:         Gastrointestinal    Blood in stool:     Vomited blood:         Genitourinary    Burning when urinating:     Blood  in urine:        Psychiatric    Major depression:         Hematologic    Bleeding problems:    Problems with blood clotting too easily:        Skin    Rashes or ulcers:        Constitutional    Fever or chills:     PHYSICAL EXAM:   Vitals:   06/26/19 0922  BP: 114/63  Pulse: 83  Resp: 20  Temp: 97.6 F (36.4 C)  TempSrc: Temporal  SpO2: 99%  Weight: 229 lb (103.9 kg)  Height: 5' 7"  (1.702 m)    GENERAL: The patient is a well-nourished female, in no acute distress. The vital signs are documented above. CARDIAC: There is a regular rate and rhythm.  VASCULAR: I do not detect carotid bruits. On the left side, which is the side of concern, she has a palpable femoral, popliteal, and posterior tibial pulse.  I cannot palpate a dorsalis pedis pulse. On the right side she has a palpable femoral, popliteal, dorsalis pedis, and posterior tibial pulse. PULMONARY: There is good air exchange bilaterally without wheezing or rales. ABDOMEN: Soft and non-tender with normal pitched bowel sounds.  MUSCULOSKELETAL: There are no major deformities or  cyanosis. NEUROLOGIC: No focal weakness or paresthesias are detected. SKIN: She has an essentially healed wound on her left great toe with one small crack on the lateral aspect of the left great toe. PSYCHIATRIC: The patient has a normal affect.  DATA:    ARTERIAL DOPPLER STUDY: I have independently interpreted her arterial Doppler study today.  On the left side, which is the side of concern there is a monophasic anterior tibial signal with a triphasic posterior tibial signal.  ABIs 100% although this may be falsely elevated secondary to calcific disease.  However the toe pressures 116 mmHg.  On the right side there is a triphasic dorsalis pedis and posterior tibial signal.  ABI is 92%.  Toe pressure is 165 mmHg.  LABS: I reviewed the labs from 05/28/2019.  Creatinine was 1.2.  GFR was 53.  LDL cholesterol 78.

## 2019-06-28 ENCOUNTER — Other Ambulatory Visit: Payer: Self-pay | Admitting: Endocrinology

## 2019-07-03 ENCOUNTER — Other Ambulatory Visit: Payer: Self-pay | Admitting: Cardiovascular Disease

## 2019-07-03 ENCOUNTER — Other Ambulatory Visit: Payer: Self-pay | Admitting: Endocrinology

## 2019-07-03 DIAGNOSIS — M7989 Other specified soft tissue disorders: Secondary | ICD-10-CM | POA: Diagnosis not present

## 2019-07-25 ENCOUNTER — Other Ambulatory Visit: Payer: Self-pay | Admitting: Endocrinology

## 2019-08-02 ENCOUNTER — Encounter (INDEPENDENT_AMBULATORY_CARE_PROVIDER_SITE_OTHER): Payer: Medicare Other | Admitting: Ophthalmology

## 2019-08-06 ENCOUNTER — Other Ambulatory Visit (HOSPITAL_COMMUNITY): Payer: Self-pay | Admitting: Family Medicine

## 2019-08-06 DIAGNOSIS — Z1231 Encounter for screening mammogram for malignant neoplasm of breast: Secondary | ICD-10-CM

## 2019-08-08 ENCOUNTER — Ambulatory Visit: Payer: Medicare Other

## 2019-08-10 IMAGING — MG DIGITAL SCREENING BILATERAL MAMMOGRAM WITH TOMO AND CAD
6 of 12 series · 6 of 36 positions shown · non-contrast
Comparison: Previous exam(s).

CLINICAL DATA: Screening.

EXAM:
DIGITAL SCREENING BILATERAL MAMMOGRAM WITH TOMO AND CAD

[R MLO synth-2D]
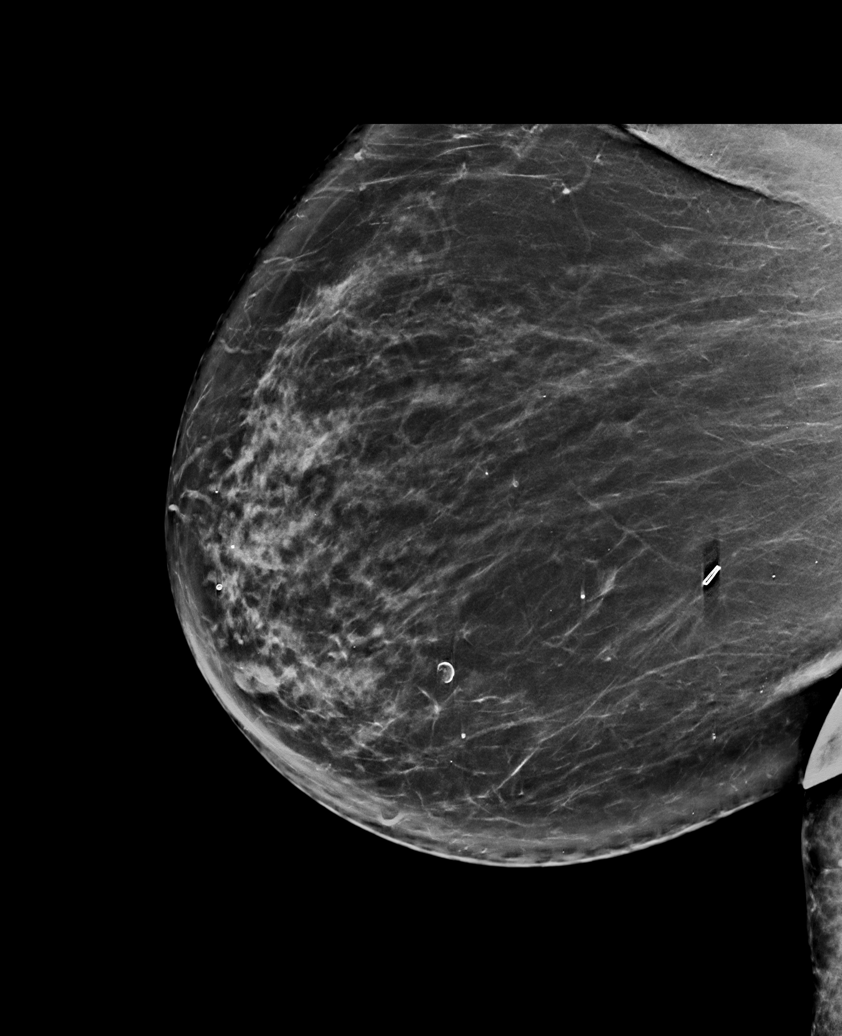

[R CC synth-2D (1 of 2)]
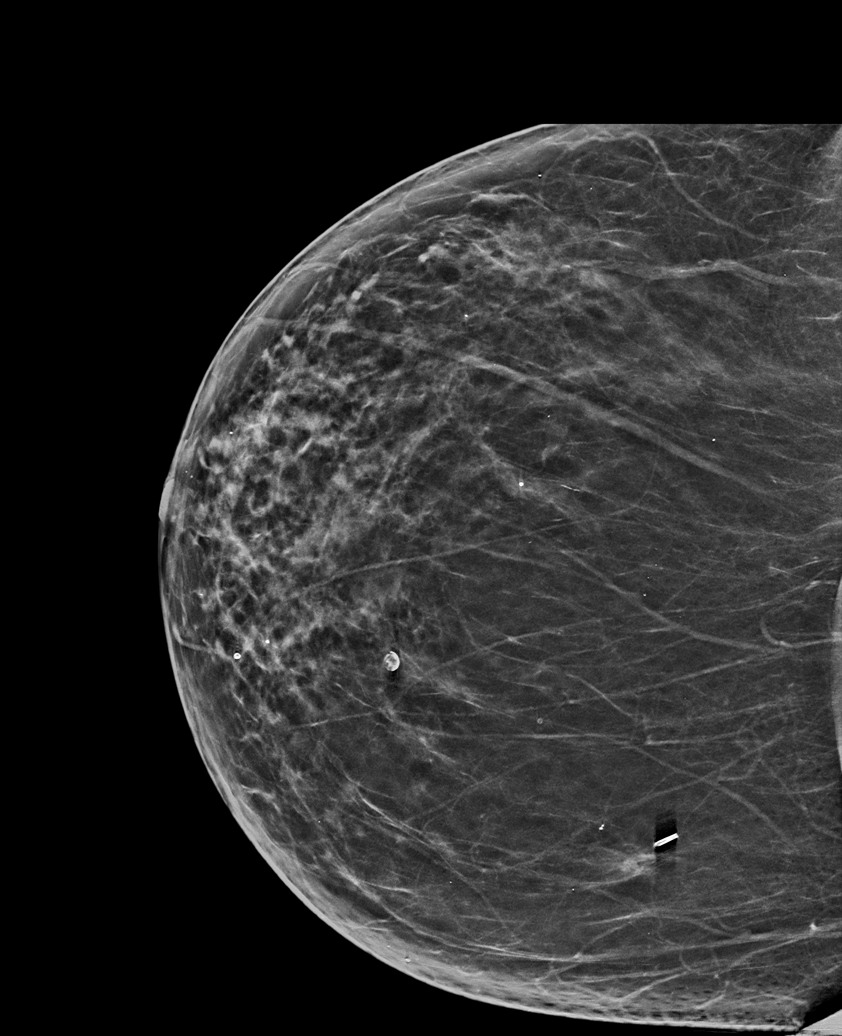

[L MLO synth-2D (1 of 2)]
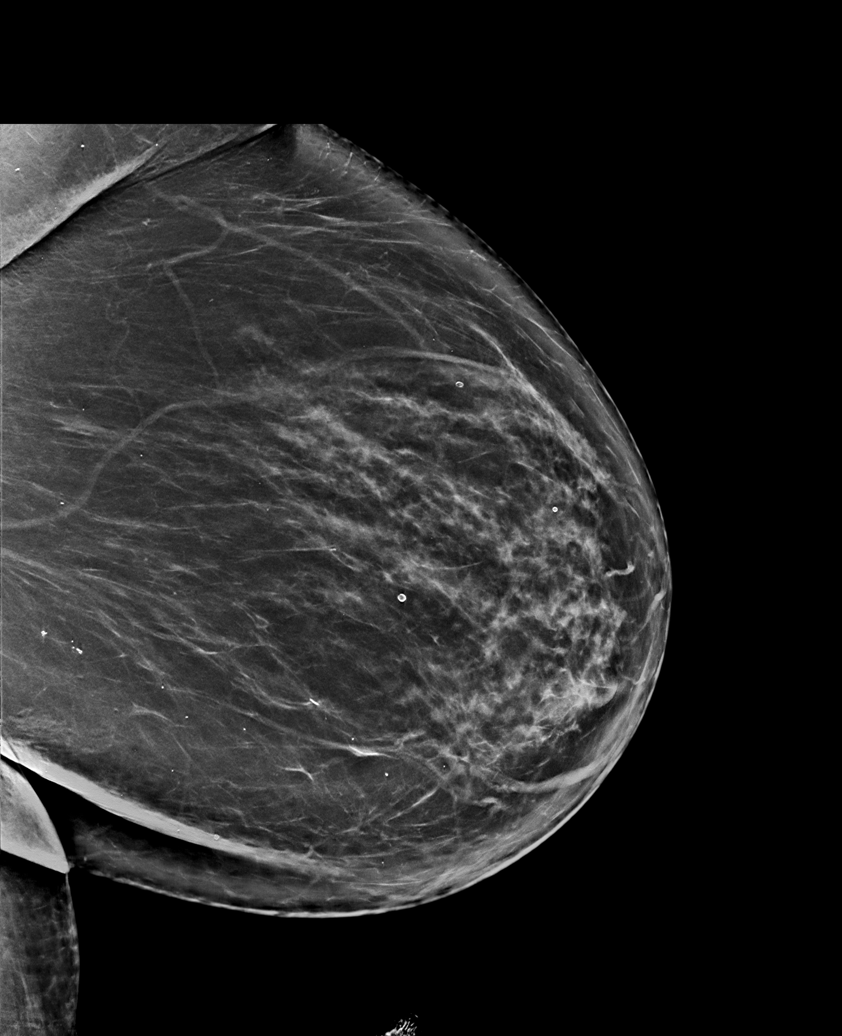

[R CC synth-2D (2 of 2)]
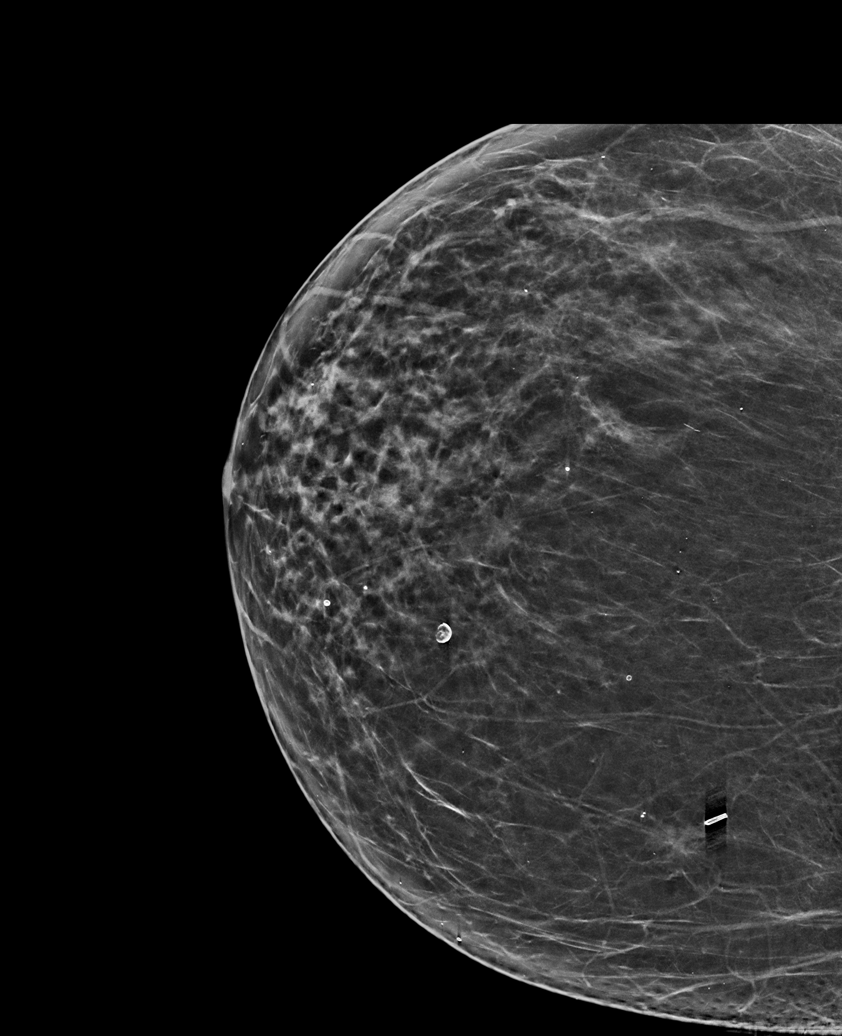

[L MLO synth-2D (2 of 2)]
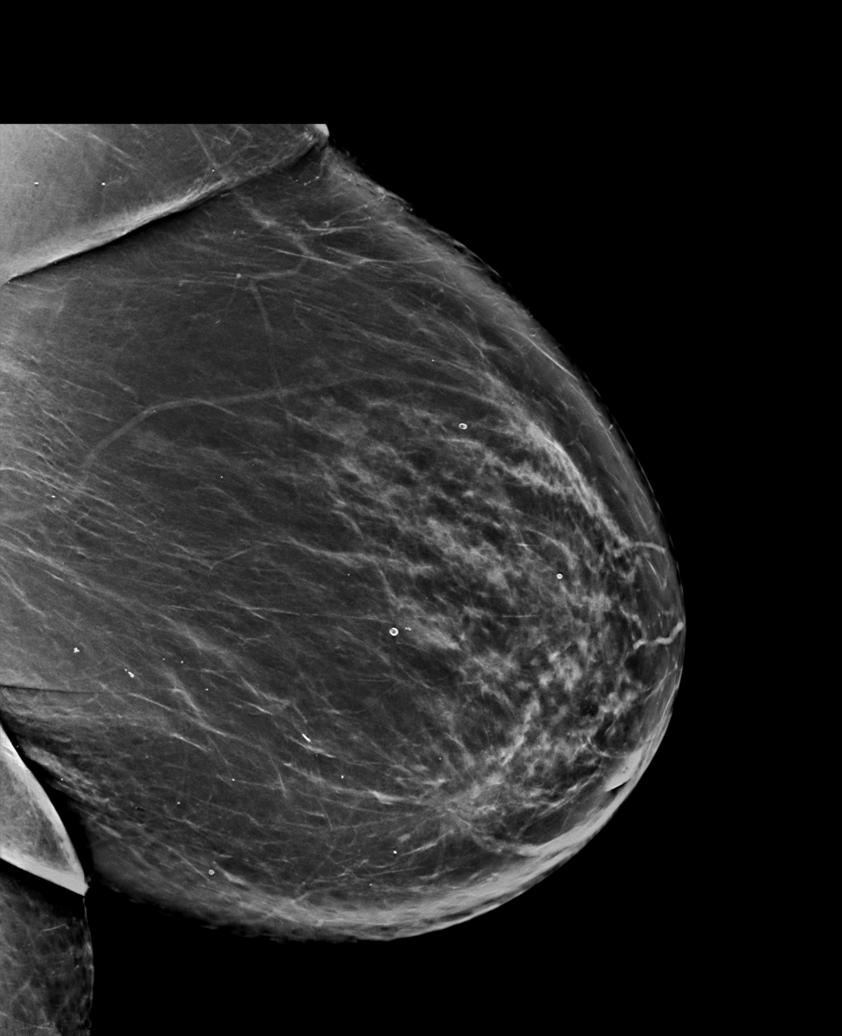

[L CC synth-2D]
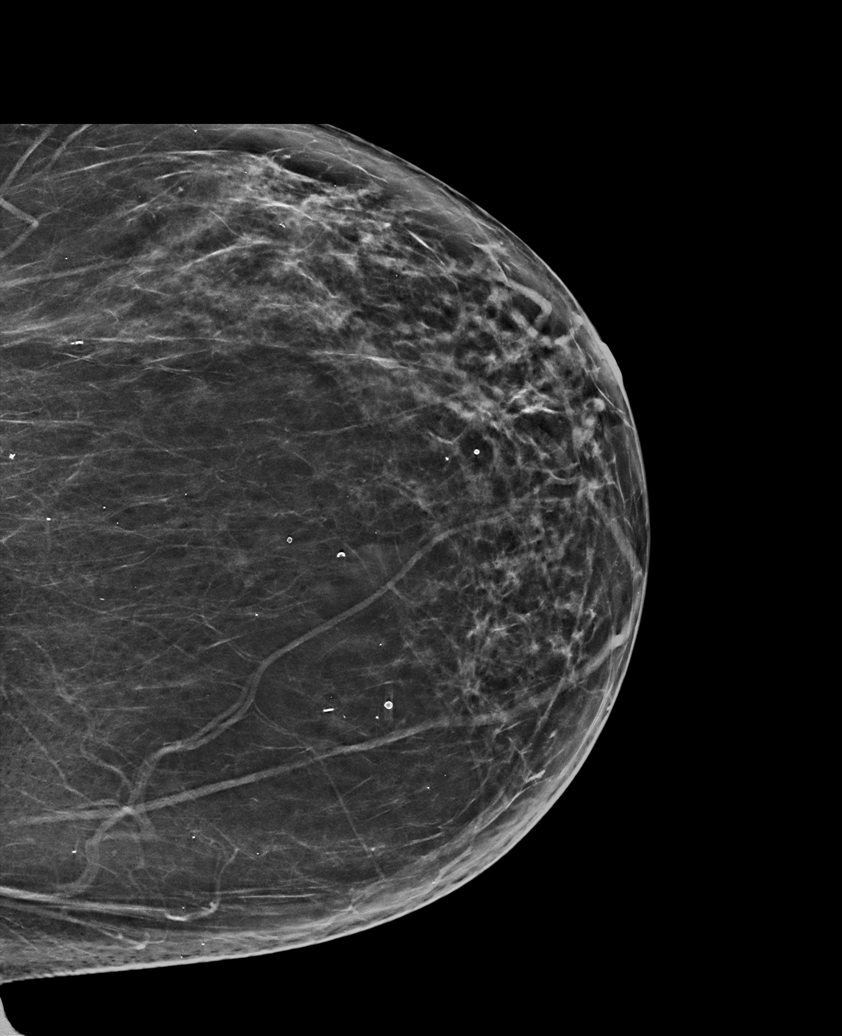

[6 of 36 positions shown; findings below may reference images not displayed]

ACR Breast Density Category b: There are scattered areas of
fibroglandular density.
FINDINGS: There are no findings suspicious for malignancy. Images were
processed with CAD.
IMPRESSION: No mammographic evidence of malignancy. A result letter of this
screening mammogram will be mailed directly to the patient.

RECOMMENDATION:
Screening mammogram in one year. (Code:CN-U-775)

BI-RADS CATEGORY  1: Negative.

## 2019-08-14 ENCOUNTER — Ambulatory Visit (INDEPENDENT_AMBULATORY_CARE_PROVIDER_SITE_OTHER): Payer: Medicare Other

## 2019-08-14 ENCOUNTER — Encounter: Payer: Self-pay | Admitting: Endocrinology

## 2019-08-14 ENCOUNTER — Other Ambulatory Visit: Payer: Self-pay

## 2019-08-14 DIAGNOSIS — Z23 Encounter for immunization: Secondary | ICD-10-CM | POA: Diagnosis not present

## 2019-08-14 NOTE — Progress Notes (Signed)
Per orders of Dr. Dwyane Dee injection of high dose flu shot given today by N.Addaleigh Nicholls,LPN. Injection given in left deltoid. Patient tolerated injection well.

## 2019-08-23 ENCOUNTER — Other Ambulatory Visit: Payer: Self-pay | Admitting: Endocrinology

## 2019-08-23 ENCOUNTER — Other Ambulatory Visit: Payer: Self-pay | Admitting: Cardiovascular Disease

## 2019-08-29 DIAGNOSIS — L03032 Cellulitis of left toe: Secondary | ICD-10-CM | POA: Diagnosis not present

## 2019-08-30 ENCOUNTER — Other Ambulatory Visit: Payer: Self-pay | Admitting: Endocrinology

## 2019-09-02 ENCOUNTER — Other Ambulatory Visit: Payer: Self-pay

## 2019-09-02 ENCOUNTER — Ambulatory Visit (HOSPITAL_COMMUNITY)
Admission: RE | Admit: 2019-09-02 | Discharge: 2019-09-02 | Disposition: A | Discharge status: Home / Self Care | Payer: Medicare Other | Source: Ambulatory Visit | Attending: Family Medicine | Admitting: Family Medicine

## 2019-09-02 DIAGNOSIS — Z1231 Encounter for screening mammogram for malignant neoplasm of breast: Secondary | ICD-10-CM | POA: Diagnosis not present

## 2019-09-04 ENCOUNTER — Ambulatory Visit: Payer: Medicare Other | Admitting: Endocrinology

## 2019-09-10 DIAGNOSIS — E1165 Type 2 diabetes mellitus with hyperglycemia: Secondary | ICD-10-CM | POA: Diagnosis not present

## 2019-09-10 DIAGNOSIS — G4762 Sleep related leg cramps: Secondary | ICD-10-CM | POA: Diagnosis not present

## 2019-09-10 DIAGNOSIS — R21 Rash and other nonspecific skin eruption: Secondary | ICD-10-CM | POA: Diagnosis not present

## 2019-09-10 DIAGNOSIS — I10 Essential (primary) hypertension: Secondary | ICD-10-CM | POA: Diagnosis not present

## 2019-09-10 DIAGNOSIS — N183 Chronic kidney disease, stage 3 unspecified: Secondary | ICD-10-CM | POA: Diagnosis not present

## 2019-09-13 DIAGNOSIS — L600 Ingrowing nail: Secondary | ICD-10-CM | POA: Diagnosis not present

## 2019-09-18 ENCOUNTER — Ambulatory Visit: Payer: Medicare Other | Admitting: Endocrinology

## 2019-09-26 ENCOUNTER — Other Ambulatory Visit: Payer: Self-pay | Admitting: Endocrinology

## 2019-09-27 ENCOUNTER — Other Ambulatory Visit: Payer: Self-pay

## 2019-10-01 ENCOUNTER — Encounter: Payer: Self-pay | Admitting: Endocrinology

## 2019-10-01 ENCOUNTER — Ambulatory Visit (INDEPENDENT_AMBULATORY_CARE_PROVIDER_SITE_OTHER): Payer: Medicare Other | Admitting: Endocrinology

## 2019-10-01 VITALS — BP 130/68 | HR 73 | Ht 67.0 in | Wt 229.6 lb

## 2019-10-01 DIAGNOSIS — Z794 Long term (current) use of insulin: Secondary | ICD-10-CM | POA: Diagnosis not present

## 2019-10-01 DIAGNOSIS — E0590 Thyrotoxicosis, unspecified without thyrotoxic crisis or storm: Secondary | ICD-10-CM | POA: Diagnosis not present

## 2019-10-01 DIAGNOSIS — E1165 Type 2 diabetes mellitus with hyperglycemia: Secondary | ICD-10-CM | POA: Diagnosis not present

## 2019-10-01 LAB — POCT GLYCOSYLATED HEMOGLOBIN (HGB A1C): Hemoglobin A1C: 8.6 % — AB (ref 4.0–5.6)

## 2019-10-01 LAB — URINALYSIS, ROUTINE W REFLEX MICROSCOPIC
Bilirubin Urine: NEGATIVE
Hgb urine dipstick: NEGATIVE
Ketones, ur: NEGATIVE
Leukocytes,Ua: NEGATIVE
Nitrite: NEGATIVE
RBC / HPF: NONE SEEN (ref 0–?)
Specific Gravity, Urine: 1.01 (ref 1.000–1.030)
Total Protein, Urine: NEGATIVE
Urine Glucose: NEGATIVE
Urobilinogen, UA: 0.2 (ref 0.0–1.0)
pH: 6 (ref 5.0–8.0)

## 2019-10-01 LAB — MICROALBUMIN / CREATININE URINE RATIO
Creatinine,U: 114.8 mg/dL
Microalb Creat Ratio: 0.6 mg/g (ref 0.0–30.0)
Microalb, Ur: <0.7 mg/dL (ref 0.0–1.9)

## 2019-10-01 LAB — T4, FREE: Free T4: 1.19 ng/dL (ref 0.60–1.60)

## 2019-10-01 LAB — COMPREHENSIVE METABOLIC PANEL
ALT: 7 U/L (ref 0–35)
AST: 10 U/L (ref 0–37)
Albumin: 4 g/dL (ref 3.5–5.2)
Alkaline Phosphatase: 95 U/L (ref 39–117)
BUN: 32 mg/dL — ABNORMAL HIGH (ref 6–23)
CO2: 25 mEq/L (ref 19–32)
Calcium: 9.5 mg/dL (ref 8.4–10.5)
Chloride: 103 mEq/L (ref 96–112)
Creatinine, Ser: 1.29 mg/dL — ABNORMAL HIGH (ref 0.40–1.20)
GFR: 49.1 mL/min — ABNORMAL LOW (ref >60.00)
Glucose, Bld: 246 mg/dL — ABNORMAL HIGH (ref 70–99)
Potassium: 4.8 mEq/L (ref 3.5–5.1)
Sodium: 134 mEq/L — ABNORMAL LOW (ref 135–145)
Total Bilirubin: 0.3 mg/dL (ref 0.2–1.2)
Total Protein: 7.4 g/dL (ref 6.0–8.3)

## 2019-10-01 LAB — TSH: TSH: <0.01 u[IU]/mL — ABNORMAL LOW (ref 0.35–4.50)

## 2019-10-01 LAB — T3, FREE: T3, Free: 3.7 pg/mL (ref 2.3–4.2)

## 2019-10-01 NOTE — Progress Notes (Signed)
Patient ID: Brittney [name], female   DOB: 1946-06-09, 73 y.o.   MRN: [id number]     Reason for Appointment: Followup of diabetes  History of Present Illness    Type 2 DIABETES MELITUS, date of diagnosis: 1976      She has had long-standing diabetes taking insulin. Because of her difficulties with compliance using mealtime insulin she was given in Byetta in 2011 and subsequently Victoza in addition to her Lantus and this helped her control However she tends to be erratic with her day-to-day care and has not had adequate A1c levels in the past, usually over 7%  RECENT history:   Insulin regimen: Lantus 44 units daily in a.m. Humalog 5 units before dinner  Non-insulin hypoglycemic drugs: taking Byetta 5 mcg before meals, was on Metformin  850 twice a day  Her A1c has gone up to 8.6  Current glucose patterns and problems:  She has been told by her PCP not to take metformin because of a recall  However she was on regular Metformin and was not told by the pharmacy about the recall  She did not inform us about this and no notes from her PCP are available  With this her blood sugars are markedly increased nonfasting  Fasting readings are not consistently high but were previously better also with Metformin  She is now taking 44 Lantus instead of 40 as recommended by her PCP  With her blood sugars being consistently high at home fasting she has taken Humalog before dinnertime but not with her meals  Blood sugars currently show evening blood sugars averaging well over 200  She also appears to have high readings after breakfast and before dinner  Frequently not eating a lunch meal but occasionally will have a sandwich  Her weight has come down 6 pounds since last year  She is not doing as much walking because of leg pain    Side effects from medications:  Invokana:? Headache.  Victoza ?  Swelling in neck      Monitors blood glucose: Recently 2-3 times a day    Glucometer: Accucheck         Blood Glucose readings from download   PRE-MEAL Fasting Lunch  630-9:30 PM Bedtime Overall  Glucose range:    191-347    Mean/median:  170   269  to 68    POST-MEAL PC Breakfast PC Lunch PC Dinner  Glucose range:     Mean/median:       Previous readings: Evening 149, 225 Overnight 111, 176 Average not available    Meals: 2-3 meals per day. . Dinner 7 pm, variable quantity and carbohydrates  Does have protein with breakfast consistently, has egg for protein   Wt Readings from Last 3 Encounters:  10/01/19 229 lb 9.6 oz (104.1 kg)  06/26/19 229 lb (103.9 kg)  10/10/18 235 lb (106.6 kg)   GLYCEMIC CONTROL:  Lab Results  Component Value Date   HGBA1C 8.6 (A) 10/01/2019   HGBA1C 7.4 (A) 10/10/2018   HGBA1C 8.1 (A) 06/26/2018   Lab Results  Component Value Date   MICROALBUR 2.0 (H) 03/26/2018   [place] 78 05/28/2019   CREATININE 1.20 05/28/2019      Other problems discussed today: See review of systems   Allergies as of 10/01/2019      Reactions   Latex    Itching and breaking out      Medication List       Accurate as of  October 01, 2019  1:34 PM. If you have any questions, ask your nurse or doctor.        STOP taking these medications   metFORMIN 850 MG tablet Commonly known as: GLUCOPHAGE Stopped by: Elayne Snare, MD     TAKE these medications   Accu-Chek Aviva Plus test strip Generic drug: glucose blood USE TO TEST TWICE DAILY.   Accu-Chek Aviva Plus w/Device Kit Use to check blood sugar 2 times per day dx code E11.65   ALPRAZolam 0.5 MG tablet Commonly known as: XANAX Take 0.5 mg by mouth at bedtime. Takes half a tab at bedtime   amLODipine 10 MG tablet Commonly known as: NORVASC TAKE (1) TABLET BY MOUTH ONCE DAILY.   B-D ULTRAFINE III SHORT PEN 31G X 8 MM Misc Generic drug: Insulin Pen Needle USE AS DIRECTED 2-3 TIMES A DAY.   Byetta 5 MCG Pen 5 MCG/0.02ML Sopn injection Generic drug: exenatide INJECT  5MCG INTO THE SKIN TWICE DAILY BEFORE MEALS.   gabapentin 100 MG capsule Commonly known as: NEURONTIN TAKE 3 CAPSULES BY MOUTH AT BEDTIME.   HumaLOG KwikPen 100 UNIT/ML KwikPen Generic drug: insulin lispro INJECT 6 TO 7 UNITS SUBCUTANEOUSLY BEFORE SUPPER IF GLUCOSE IS HIGH.   ibuprofen 200 MG tablet Commonly known as: ADVIL Take 400 mg by mouth as needed.   Lantus SoloStar 100 UNIT/ML Solostar Pen Generic drug: Insulin Glargine INJECT 44 UNITS SUBCUTANEOUSLY ONCE EVERY MORNING.   loratadine 10 MG tablet Commonly known as: CLARITIN Take 10 mg by mouth daily as needed for allergies.   metoprolol tartrate 50 MG tablet Commonly known as: LOPRESSOR TAKE 1/2 TABLET BY MOUTH TWICE DAILY.   multivitamin tablet Take 1 tablet by mouth once a week.   nystatin-triamcinolone ointment Commonly known as: MYCOLOG Apply 1 application topically 2 (two) times daily.   olmesartan 40 MG tablet Commonly known as: BENICAR   omeprazole 20 MG capsule Commonly known as: PRILOSEC Take 20 mg by mouth daily.   onetouch Engineer, agricultural Lancets lancets Use as instructed to check blood sugar 3 times daily. Dx Code E11.65   polyethylene glycol powder 17 GM/SCOOP powder Commonly known as: GLYCOLAX/MIRALAX Take 1 Container by mouth as needed for moderate constipation.   pravastatin 20 MG tablet Commonly known as: PRAVACHOL TAKE ONE TABLET BY MOUTH DAILY.   Santyl ointment Generic drug: collagenase   torsemide 20 MG tablet Commonly known as: DEMADEX TAKE 1 TABLET BY MOUTH ONCE DAILY AS NEEDED.   torsemide 20 MG tablet Commonly known as: DEMADEX TAKE 1 TABLET BY MOUTH ONCE DAILY AS NEEDED.   triamterene-hydrochlorothiazide 37.5-25 MG tablet Commonly known as: MAXZIDE-25 TAKE 1 TABLET BY MOUTH ONCE A DAY.       Allergies:  Allergies  Allergen Reactions  . Latex     Itching and breaking out    Past Medical History:  Diagnosis Date  . Atrial fibrillation  (HCC)    Paroxysmal; LVH; nl EF; onset in 1999  . Burning with urination 02/23/2016  . Diabetes mellitus    insulin; managed by Dr. Dwyane Dee  . Diabetic Charcot's joint disease ([place])    left lower extremity  . Hyperlipidemia   . Hypertension   . Hypothyroidism    history of goiter; nl TSH off medication  . Itching with irritation 02/27/2015  . Obesity 07/06/2012  . Palpitations    negative event recorder in 2009  . UTI (urinary tract infection) 02/23/2016    Past Surgical History:  Procedure  Laterality Date  . BIOPSY THYROID    . CESAREAN SECTION    . CHOLECYSTECTOMY  11/95  . COLONOSCOPY  2007  . RETINAL DETACHMENT SURGERY  2009  . TUBAL LIGATION  1980's    Family History  Problem Relation Age of Onset  . Diabetes Mother   . Diabetes Daughter        gestational diabetes  . Cancer Father   . Thyroid disease Brother   . Other Son        MVA    Social History:  reports that she quit smoking about 33 years ago. Her smoking use included cigarettes. She started smoking about 52 years ago. She smoked 1.00 pack per day. She has never used smokeless tobacco. She reports that she does not drink alcohol or use drugs.  Review of Systems:   HYPERTENSION:  Followed by PCP, usually controlled on Norvasc and Benicar She has checked her blood pressure at home periodically, last reading 134/72  Last visit with PCP was in 5/20  HYPERLIPIDEMIA: The lipid abnormality consists of elevated LDL; she has been getting good results with Pravachol   Lab Results  Component Value Date   CHOL 163 05/28/2019   HDL 62.90 05/28/2019   LDLCALC 78 05/28/2019   TRIG 113.0 05/28/2019   CHOLHDL 3 05/28/2019    MULTINODULAR GOITER: She has had a long-standing multinodular goiter since at least 2005 which has been fairly stable clinically She does have history of occasional palpitations for years Metoprolol has been prescribed by cardiologist  She has no local pressure symptoms in the neck or problems  with swallowing She is concerned about her thyroid being larger than before  Her goiter has been autonomous with no evidence of overt hyperthyroidism  with normal free T4 and free T3 levels; TSH has been consistently suppressed No recent labs available  Her I-131 uptake in 2003 was 17% and her  thyroid ultrasound in 2014 did not show distinct nodules but a relative increase in size  Lab Results  Component Value Date   TSH 0.03 (L) 05/28/2019   TSH 0.10 (L) 03/26/2018   FREET4 0.81 05/28/2019   FREET4 0.78 03/26/2018   Lab Results  Component Value Date   T3FREE 3.1 05/28/2019   T3FREE 3.0 03/26/2018   T3FREE 3.2 09/08/2017           Examination:   BP 130/68 (BP Location: Left Arm, Patient Position: Sitting, Cuff Size: Normal)   Pulse 73   Ht 5' 7" (1.702 m)   Wt 229 lb 9.6 oz (104.1 kg)   SpO2 99%   BMI 35.96 kg/m   Body mass index is 35.96 kg/m.   Neck circumference 42 cm She has marked thyroid enlargement on the right side about 4-5 times normal This is not nodular and feels relatively smooth and fleshy, left lobe is about 3 times normal with firmer area medially No tremor present No ankle edema  ASSESSMENT/ PLAN:  Diabetes Type 2 with obesity on insulin, Byetta and metformin  See history of present illness for detailed discussion of  current management, blood sugar patterns and problems identified  Her A1c is higher at 8.6, previously higher at 8.6, previously significantly higher at 8.6  She was told by her PCP to stop Metformin However this has worsened her diabetes control especially nonfasting blood sugar Discussed in detail not the recall for Metformin ER does not affect her and this is also not a reason to stop the Metformin anyway She  is now however trying to take Humalog with her evening meal although this is certainly not enough and she already has high readings even before dinner frequently Fasting readings are relatively higher also although  inconsistent despite increasing her Lantus Did not exercise much since her last visit Also checking blood sugars a little more but not after breakfast  Recommendations:  Restart Metformin that she has at home, she has a regular rate 50 mg tablets She will need to take Humalog before breakfast and supper consistently for now If her blood sugars are significantly lower after breakfast she can stop the morning Humalog However she probably will need to take Humalog at lunch also if she is eating any carbohydrate like a sandwich She will try to start walking more regularly She will take up to 6 or 7 units when eating out or eating more carbohydrates at the evening meal Discussed need to take the mealtime insulin before starting to eat    MULTINODULAR goiter: This is longstanding and has been growing in size gradually Most of the growth is in the right lobe and this appears larger than usual However she is not a surgical candidate and previously had not had evidence of hypothyroidism Also did not have any heart nodules that could be potentially treated This is despite persistently low TSH  Needs follow-up of thyroid function today to decide if she may be a candidate for I-131 treatment   Hypertension: Blood pressure is well-controlled    LIPIDS: Will need follow-up    Counseling time on subjects discussed in assessment and plan sections is over 50% of today's 25 minute visit    Patient Instructions  Take 5-6 Humalog at all meals with Carbs  Metformin 2x daily     Elayne Snare 10/01/2019, 1:34 PM

## 2019-10-01 NOTE — Patient Instructions (Signed)
Take 5-6 Humalog at all meals with Carbs  Metformin 2x daily

## 2019-10-03 DIAGNOSIS — Z0000 Encounter for general adult medical examination without abnormal findings: Secondary | ICD-10-CM | POA: Diagnosis not present

## 2019-10-28 ENCOUNTER — Other Ambulatory Visit: Payer: Self-pay | Admitting: Endocrinology

## 2019-11-11 ENCOUNTER — Other Ambulatory Visit: Payer: Self-pay

## 2019-11-11 MED ORDER — GABAPENTIN 100 MG PO CAPS: 300.0000 mg | ORAL_CAPSULE | Freq: Every day | ORAL | 0 refills | Status: DC

## 2019-11-26 ENCOUNTER — Other Ambulatory Visit: Payer: Self-pay | Admitting: Endocrinology

## 2019-11-26 ENCOUNTER — Other Ambulatory Visit: Payer: Self-pay | Admitting: Cardiovascular Disease

## 2019-12-06 ENCOUNTER — Ambulatory Visit: Payer: Medicare Other | Admitting: Endocrinology

## 2019-12-20 ENCOUNTER — Other Ambulatory Visit: Payer: Self-pay | Admitting: Endocrinology

## 2019-12-25 ENCOUNTER — Other Ambulatory Visit: Payer: Self-pay | Admitting: Endocrinology

## 2019-12-26 ENCOUNTER — Encounter (INDEPENDENT_AMBULATORY_CARE_PROVIDER_SITE_OTHER): Payer: Medicare Other | Admitting: Ophthalmology

## 2019-12-26 DIAGNOSIS — E11311 Type 2 diabetes mellitus with unspecified diabetic retinopathy with macular edema: Secondary | ICD-10-CM

## 2019-12-26 DIAGNOSIS — E113592 Type 2 diabetes mellitus with proliferative diabetic retinopathy without macular edema, left eye: Secondary | ICD-10-CM

## 2019-12-26 DIAGNOSIS — E113511 Type 2 diabetes mellitus with proliferative diabetic retinopathy with macular edema, right eye: Secondary | ICD-10-CM | POA: Diagnosis not present

## 2019-12-26 DIAGNOSIS — I10 Essential (primary) hypertension: Secondary | ICD-10-CM | POA: Diagnosis not present

## 2019-12-26 DIAGNOSIS — H43813 Vitreous degeneration, bilateral: Secondary | ICD-10-CM | POA: Diagnosis not present

## 2019-12-26 DIAGNOSIS — H35033 Hypertensive retinopathy, bilateral: Secondary | ICD-10-CM

## 2019-12-26 LAB — HM DIABETES EYE EXAM

## 2019-12-27 ENCOUNTER — Other Ambulatory Visit: Payer: Self-pay | Admitting: Endocrinology

## 2019-12-27 ENCOUNTER — Ambulatory Visit: Payer: Medicare Other | Admitting: Podiatry

## 2019-12-30 ENCOUNTER — Encounter: Payer: Self-pay | Admitting: Endocrinology

## 2019-12-30 ENCOUNTER — Ambulatory Visit (INDEPENDENT_AMBULATORY_CARE_PROVIDER_SITE_OTHER): Payer: Medicare Other | Admitting: Endocrinology

## 2019-12-30 ENCOUNTER — Other Ambulatory Visit: Payer: Self-pay

## 2019-12-30 VITALS — BP 140/60 | HR 96 | Ht 67.0 in | Wt 226.8 lb

## 2019-12-30 DIAGNOSIS — E0590 Thyrotoxicosis, unspecified without thyrotoxic crisis or storm: Secondary | ICD-10-CM | POA: Diagnosis not present

## 2019-12-30 DIAGNOSIS — Z794 Long term (current) use of insulin: Secondary | ICD-10-CM

## 2019-12-30 DIAGNOSIS — E7800 Pure hypercholesterolemia, unspecified: Secondary | ICD-10-CM | POA: Diagnosis not present

## 2019-12-30 DIAGNOSIS — E1165 Type 2 diabetes mellitus with hyperglycemia: Secondary | ICD-10-CM

## 2019-12-30 LAB — COMPREHENSIVE METABOLIC PANEL
ALT: 12 U/L (ref 0–35)
AST: 14 U/L (ref 0–37)
Albumin: 4.2 g/dL (ref 3.5–5.2)
Alkaline Phosphatase: 92 U/L (ref 39–117)
BUN: 24 mg/dL — ABNORMAL HIGH (ref 6–23)
CO2: 24 mEq/L (ref 19–32)
Calcium: 10 mg/dL (ref 8.4–10.5)
Chloride: 104 mEq/L (ref 96–112)
Creatinine, Ser: 1.42 mg/dL — ABNORMAL HIGH (ref 0.40–1.20)
GFR: 43.92 mL/min — ABNORMAL LOW (ref >60.00)
Glucose, Bld: 172 mg/dL — ABNORMAL HIGH (ref 70–99)
Potassium: 5.9 mEq/L — ABNORMAL HIGH (ref 3.5–5.1)
Sodium: 134 mEq/L — ABNORMAL LOW (ref 135–145)
Total Bilirubin: 0.4 mg/dL (ref 0.2–1.2)
Total Protein: 7.9 g/dL (ref 6.0–8.3)

## 2019-12-30 LAB — T3, FREE: T3, Free: 3.4 pg/mL (ref 2.3–4.2)

## 2019-12-30 LAB — LIPID PANEL
Cholesterol: 191 mg/dL (ref 0–200)
HDL: 62.1 mg/dL (ref >39.00)
LDL Cholesterol: 99 mg/dL (ref 0–99)
NonHDL: 128.51
Total CHOL/HDL Ratio: 3
Triglycerides: 148 mg/dL (ref 0.0–149.0)
VLDL: 29.6 mg/dL (ref 0.0–40.0)

## 2019-12-30 LAB — POCT GLYCOSYLATED HEMOGLOBIN (HGB A1C): Hemoglobin A1C: 8.1 % — AB (ref 4.0–5.6)

## 2019-12-30 LAB — T4, FREE: Free T4: 1.16 ng/dL (ref 0.60–1.60)

## 2019-12-30 LAB — TSH: TSH: <0.01 u[IU]/mL — ABNORMAL LOW (ref 0.35–4.50)

## 2019-12-30 MED ORDER — BYETTA 5 MCG PEN 5 MCG/0.02ML ~~LOC~~ SOPN: PEN_INJECTOR | SUBCUTANEOUS | 3 refills | Status: DC

## 2019-12-30 NOTE — Progress Notes (Signed)
Patient ID: Brittney [name], female   DOB: [birth year], 74 y.o.   MRN: [id number]     Reason for Appointment: Followup of diabetes  History of Present Illness    Type 2 DIABETES MELITUS, date of diagnosis: 1976      She has had long-standing diabetes taking insulin. Because of her difficulties with compliance using mealtime insulin she was given in Byetta in 2011 and subsequently Victoza in addition to her Lantus and this helped her control However she tends to be erratic with her day-to-day care and has not had adequate A1c levels in the past, usually over 7%  RECENT history:   Insulin regimen: Lantus 44 units daily in a.m. Humalog 5 units before dinner 3/7  Non-insulin hypoglycemic drugs: taking Byetta 5 mcg before meals, was on Metformin  850 twice a day  Her A1c had gone up to 8.6 and is now 8.1  Current glucose patterns and problems:  Her Metformin was restarted on her last visit in 11/20, and this had been previously stopped on the recommendation of her PCP  This was restarted on her last visit in 11/20  As before she is checking her blood sugars mostly in the mornings and not clear which readings are after meals  Also she does check some blood sugars in the evenings at various times, presumably mostly before dinner  Although she is eating a light meal at lunch she still has some high readings around dinnertime at 6 PM  She forgot to refill her Byetta about a month ago and did not ask for refill even though she knows she was supposed to continue that  Currently postprandial readings do not appear to be very significantly high but not clear how often she has high readings  She is really watching her diet and her weight is down 3 pounds  This is despite doing only a small amount of walking either indoors or to her mailbox  She sometimes has HYPOGLYCEMIC symptoms early morning although lowest blood sugar recently is 70, previously her average blood sugar fasting  without Metformin was 170  She is still unclear about when to take her Humalog and likely injecting this about 3 days a week, was also previously explained that she should take it when she is eating full meals with some carbohydrates are relatively high fat    Side effects from medications:  Invokana: ? Headache.  Victoza ?  Swelling in neck      Monitors blood glucose: Up to 2-3 times a day     Glucometer: Accucheck         Blood Glucose readings from download   PRE-MEAL Fasting Lunch  evening Bedtime Overall  Glucose range:  70-174   111-175    Mean/median: 116    130   POST-MEAL PC Breakfast PC Lunch PC Dinner  Glucose range:  146-207  ?  Mean/median:       Previous readings:  PRE-MEAL Fasting Lunch  630-9:30 PM Bedtime Overall  Glucose range:    191-347    Mean/median:  170   269  268         Meals: 2-3 meals per day. . Dinner 6-7 pm, variable quantity and carbohydrates  Does have protein with breakfast consistently, has egg for protein   Wt Readings from Last 3 Encounters:  12/30/19 226 lb 12.8 oz (102.9 kg)  10/01/19 229 lb 9.6 oz (104.1 kg)  06/26/19 229 lb (103.9 kg)   GLYCEMIC CONTROL:  Lab Results  Component Value Date   HGBA1C 8.1 (A) 12/30/2019   HGBA1C 8.6 (A) 10/01/2019   HGBA1C 7.4 (A) 10/10/2018   Lab Results  Component Value Date   MICROALBUR <0.7 10/01/2019   LDLCALC 78 05/28/2019   CREATININE 1.29 (H) 10/01/2019   Lab Results  Component Value Date   FRUCTOSAMINE 342 (H) 09/08/2017   FRUCTOSAMINE 344 (H) 06/08/2017      Other problems discussed today: See review of systems   Allergies as of 12/30/2019      Reactions   Latex    Itching and breaking out      Medication List       Accurate as of December 30, 2019 11:07 AM. If you have any questions, ask your nurse or doctor.        Accu-Chek Aviva Plus test strip Generic drug: glucose blood USE TO TEST TWICE DAILY.   Accu-Chek Aviva Plus w/Device Kit Use to check blood  sugar 2 times per day dx code E11.65   ALPRAZolam 0.5 MG tablet Commonly known as: XANAX Take 0.5 mg by mouth at bedtime. Takes half a tab at bedtime   amLODipine 10 MG tablet Commonly known as: NORVASC TAKE (1) TABLET BY MOUTH ONCE DAILY.   B-D ULTRAFINE III SHORT PEN 31G X 8 MM Misc Generic drug: Insulin Pen Needle USE AS DIRECTED 2-3 TIMES A DAY.   Byetta 5 MCG Pen 5 MCG/0.02ML Sopn injection Generic drug: exenatide INJECT 5MCG INTO THE SKIN TWICE DAILY BEFORE MEALS.   gabapentin 100 MG capsule Commonly known as: NEURONTIN Take 3 capsules (300 mg total) by mouth at bedtime.   HumaLOG KwikPen 100 UNIT/ML KwikPen Generic drug: insulin lispro INJECT 6 TO 7 UNITS SUBCUTANEOUSLY BEFORE SUPPER IF GLUCOSE IS HIGH.   ibuprofen 200 MG tablet Commonly known as: ADVIL Take 400 mg by mouth as needed.   Lantus SoloStar 100 UNIT/ML Solostar Pen Generic drug: Insulin Glargine INJECT 44 UNITS SUBCUTANEOUSLY ONCE EVERY MORNING.   loratadine 10 MG tablet Commonly known as: CLARITIN Take 10 mg by mouth daily as needed for allergies.   metFORMIN 850 MG tablet Commonly known as: GLUCOPHAGE TAKE (1) TABLET BY MOUTH TWICE DAILY.   metoprolol tartrate 50 MG tablet Commonly known as: LOPRESSOR TAKE 1/2 TABLET BY MOUTH TWICE DAILY.   multivitamin tablet Take 1 tablet by mouth once a week.   nystatin-triamcinolone ointment Commonly known as: MYCOLOG Apply 1 application topically 2 (two) times daily.   olmesartan 40 MG tablet Commonly known as: BENICAR   omeprazole 20 MG capsule Commonly known as: PRILOSEC Take 20 mg by mouth daily.   onetouch Engineer, agricultural Lancets lancets Use as instructed to check blood sugar 3 times daily. Dx Code E11.65   polyethylene glycol powder 17 GM/SCOOP powder Commonly known as: GLYCOLAX/MIRALAX Take 1 Container by mouth as needed for moderate constipation.   pravastatin 20 MG tablet Commonly known as: PRAVACHOL TAKE ONE  TABLET BY MOUTH DAILY.   Santyl ointment Generic drug: collagenase   Sure Comfort Insulin Syringe 31G X 5/16" 0.3 ML Misc Generic drug: Insulin Syringe-Needle U-100 USE AS DIRECTED 2-3 TIMES A DAY.   torsemide 20 MG tablet Commonly known as: DEMADEX TAKE 1 TABLET BY MOUTH ONCE DAILY AS NEEDED.   triamterene-hydrochlorothiazide 37.5-25 MG tablet Commonly known as: MAXZIDE-25 TAKE 1 TABLET BY MOUTH ONCE A DAY.       Allergies:  Allergies  Allergen Reactions  . Latex     Itching  and breaking out    Past Medical History:  Diagnosis Date  . Atrial fibrillation (HCC)    Paroxysmal; LVH; nl EF; onset in 1999  . Burning with urination 02/23/2016  . Diabetes mellitus    insulin; managed by Dr. Dwyane Dee  . 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 Hyperlipidemia   . Hypertension   . Hypothyroidism    history of goiter; nl TSH off medication  . Itching with irritation 02/27/2015  . Obesity 07/06/2012  . Palpitations    negative event recorder in 2009  . UTI (urinary tract infection) 02/23/2016    Past Surgical History:  Procedure Laterality Date  . BIOPSY THYROID    . CESAREAN SECTION    . CHOLECYSTECTOMY  11/95  . COLONOSCOPY  2007  . RETINAL DETACHMENT SURGERY  2009  . TUBAL LIGATION  1980's    Family History  Problem Relation Age of Onset  . Diabetes Mother   . Diabetes Daughter        gestational diabetes  . Cancer Father   . Thyroid disease Brother   . Other Son        MVA    Social History:  reports that she quit smoking about 33 years ago. Her smoking use included cigarettes. She started smoking about 53 years ago. She smoked 1.00 pack per day. She has never used smokeless tobacco. She reports that she does not drink alcohol or use drugs.  Review of Systems:   HYPERTENSION:  Followed by PCP, usually controlled on Norvasc and Benicar  BP Readings from Last 3 Encounters:  12/30/19 140/60  10/01/19 130/68  06/26/19 114/63      HYPERLIPIDEMIA: The lipid abnormality consists of elevated LDL; she has been getting good results with pravastatin 20 mg only   Lab Results  Component Value Date   CHOL 163 05/28/2019   HDL 62.90 05/28/2019   LDLCALC 78 05/28/2019   TRIG 113.0 05/28/2019   CHOLHDL 3 05/28/2019    MULTINODULAR GOITER: She has had a long-standing multinodular goiter since at least 2005 which has been fairly stable clinically She does have history of occasional palpitations for years Metoprolol has been prescribed by cardiologist  She has no local pressure symptoms in the neck or problems with swallowing She is concerned about her thyroid being larger than before but her last exam indicates stable enlargement  Her goiter has been autonomous with no evidence of overt hyperthyroidism  with normal free T4 and free T3 levels; TSH has been consistently suppressed   Her I-131 uptake in 2003 was 17% and her  thyroid ultrasound in 2014 did not show distinct nodules but a relative increase in size  Lab Results  Component Value Date   TSH <0.01 (L) 10/01/2019   TSH 0.03 (L) 05/28/2019   FREET4 1.19 10/01/2019   FREET4 0.81 05/28/2019   Lab Results  Component Value Date   T3FREE 3.7 10/01/2019   T3FREE 3.1 05/28/2019   T3FREE 3.0 03/26/2018       Foot exam done 12/30/2019 She has some numbness in her toes which is mild and chronic and previously followed by podiatrist locally She is planning to establish with a new local podiatrist    Examination:   BP 140/60 (BP Location: Left Arm, Patient Position: Sitting, Cuff Size: Large)   Pulse 96   Ht 5' 7"  (1.702 m)   Wt 226 lb 12.8 oz (102.9 kg)   SpO2 98%   BMI  35.52 kg/m   Body mass index is 35.52 kg/m.    Diabetic Foot Exam - Simple   Simple Foot Form Diabetic Foot exam was performed with the following findings: Yes   Visual Inspection No deformities, no ulcerations, no other skin breakdown bilaterally: Yes Sensation Testing See  comments: Yes Pulse Check See comments: Yes Comments Absent monofilament sensation on the first and second right dorsal toe areas, decreased left great toe.  Otherwise normal Absent pedal pulses     No ankle edema  ASSESSMENT/ PLAN:  Diabetes Type 2 with obesity on insulin, Byetta and metformin  See history of present illness for detailed discussion of  current management, blood sugar patterns and problems identified  Her A1c is still relatively high at 8.1 previously was at 8.6  Blood sugars are improving with going back on Metformin which she is tolerating However has not been taking her Byetta for the last month as she ran out She needs to reduce her Lantus because of low normal fasting readings with cutting back on her Metformin Also unclear how often and how much Humalog to take since she is not always checking postprandial readings in the evenings Diet is variable  Recommendations:  Restart Byetta, she needs to keep this going regularly before breakfast and dinner She will only take Humalog if she is eating any carbohydrate in the evening and given her examples of carbohydrates To take the below before starting to eat Also check more readings after eating More consistent walking Reduce Lantus to 40 units again   MULTINODULAR goiter: Has persistently low TSH, last free T3 was upper normal at 3.7  Needs follow-up of thyroid function today to decide if she may be a candidate for I-131 treatment   Hypertension: Blood pressure is well-controlled    LIPIDS: Will need follow-up labs today  Foot care: Reminded her to check her feet on the soles daily and establish with her new podiatrist    Patient Instructions  Check blood sugars on waking up 3-4 days a week  Also check blood sugars about 2 hours after meals and do this after different meals by rotation  Recommended blood sugar levels on waking up are 80-130 and about 2 hours after meal is 130-160  Please bring  your blood sugar monitor to each visit, thank you  lantus 40 units        Jream Broyles 12/30/2019, 11:07 AM

## 2019-12-30 NOTE — Patient Instructions (Addendum)
Check blood sugars on waking up 3-4 days a week  Also check blood sugars about 2 hours after meals and do this after different meals by rotation  Recommended blood sugar levels on waking up are 80-130 and about 2 hours after meal is 130-160  Please bring your blood sugar monitor to each visit, thank you  lantus 40 units

## 2019-12-31 ENCOUNTER — Other Ambulatory Visit: Payer: Self-pay

## 2019-12-31 MED ORDER — CLONIDINE 0.1 MG/24HR TD PTWK: 0.1000 mg | MEDICATED_PATCH | TRANSDERMAL | 2 refills | Status: DC

## 2019-12-31 MED ORDER — HYDROCHLOROTHIAZIDE 12.5 MG PO CAPS: 12.5000 mg | ORAL_CAPSULE | Freq: Every day | ORAL | 2 refills | Status: DC

## 2019-12-31 NOTE — Progress Notes (Signed)
Potassium level is high.  She needs to stop olmesartan and triamterene HCTZ.  Start clonidine patch 0.1 mg weekly and HCTZ 12.5 mg daily for blood pressure.  Also stop eating any high potassium foods like bananas, oranges and baked potatoes.  She needs to call PCP to arrange follow-up this week.  Labs faxed to PCP

## 2020-01-01 ENCOUNTER — Telehealth: Payer: Self-pay | Admitting: Endocrinology

## 2020-01-01 NOTE — Telephone Encounter (Signed)
Called pt and she stated that she could not remember the last results and MD message that I had given her yesterday.  Pt was again told (and wrote down on paper this time) that per Dr. Ronnie Derby instructions, the pt should stop taking Olmesartan and Triamterene HCTZ, and begin taking Clonidine patches once weekly and HCTZ 12.5mg  daily. She also verbalized understanding that she needs to avoid high potassium foods like bananas, oranges, and baked potatoes.

## 2020-01-01 NOTE — Telephone Encounter (Signed)
Patient called requesting to have Dr Dwyane Dee call her to explain to her what her instructions were - she stated that Dr Dwyane Dee called her yesterday and gave her instructions but I do not see an encounter. Ph# [phone]

## 2020-01-19 DIAGNOSIS — E1122 Type 2 diabetes mellitus with diabetic chronic kidney disease: Secondary | ICD-10-CM | POA: Diagnosis not present

## 2020-01-19 DIAGNOSIS — N183 Chronic kidney disease, stage 3 unspecified: Secondary | ICD-10-CM | POA: Diagnosis not present

## 2020-01-19 DIAGNOSIS — I129 Hypertensive chronic kidney disease with stage 1 through stage 4 chronic kidney disease, or unspecified chronic kidney disease: Secondary | ICD-10-CM | POA: Diagnosis not present

## 2020-01-19 DIAGNOSIS — E785 Hyperlipidemia, unspecified: Secondary | ICD-10-CM | POA: Diagnosis not present

## 2020-01-22 LAB — HM DIABETES EYE EXAM

## 2020-01-23 ENCOUNTER — Telehealth: Payer: Self-pay | Admitting: Endocrinology

## 2020-01-23 NOTE — Telephone Encounter (Signed)
Patient called to advise that the BP patch is irritating her skin and is itchy.  She is supposed to put a new one on today. Would like to know how to proceed?

## 2020-01-23 NOTE — Telephone Encounter (Signed)
She can switch to clonidine tablets 0.1 mg twice daily.  I had asked her to see her PCP right away also including for labs.  Please remind her to do so if not already done.  Also need to know if her blood pressure is higher

## 2020-01-24 NOTE — Telephone Encounter (Signed)
Called pt and scheduled her for f/u with same day labs for 01/27/20 at 1430.

## 2020-01-24 NOTE — Telephone Encounter (Signed)
I had clearly told her to see her PCP and get lab work for her kidney function.  If she cannot get to him we can see her here with same day labs

## 2020-01-24 NOTE — Telephone Encounter (Signed)
Called pt to give her MD message. Pt stated that she had to change the patch yesterday because it was time for a new one and she had just picked up a refill anyways, and it stopped itching. Pt stated that she would continue the patch for now since the itching stopped and she just picked up a new Rx. She also stated that she had blood work last month. Does she need more?

## 2020-01-25 ENCOUNTER — Other Ambulatory Visit: Payer: Self-pay | Admitting: Endocrinology

## 2020-01-27 ENCOUNTER — Encounter: Payer: Self-pay | Admitting: Endocrinology

## 2020-01-27 ENCOUNTER — Ambulatory Visit (INDEPENDENT_AMBULATORY_CARE_PROVIDER_SITE_OTHER): Payer: Medicare Other | Admitting: Endocrinology

## 2020-01-27 ENCOUNTER — Other Ambulatory Visit: Payer: Self-pay

## 2020-01-27 VITALS — BP 122/66 | HR 81 | Ht 67.0 in | Wt 227.4 lb

## 2020-01-27 DIAGNOSIS — E1165 Type 2 diabetes mellitus with hyperglycemia: Secondary | ICD-10-CM | POA: Diagnosis not present

## 2020-01-27 DIAGNOSIS — E875 Hyperkalemia: Secondary | ICD-10-CM | POA: Diagnosis not present

## 2020-01-27 DIAGNOSIS — Z794 Long term (current) use of insulin: Secondary | ICD-10-CM

## 2020-01-27 LAB — BASIC METABOLIC PANEL
BUN: 20 mg/dL (ref 6–23)
CO2: 29 mEq/L (ref 19–32)
Calcium: 9.7 mg/dL (ref 8.4–10.5)
Chloride: 103 mEq/L (ref 96–112)
Creatinine, Ser: 1.17 mg/dL (ref 0.40–1.20)
GFR: 54.91 mL/min — ABNORMAL LOW (ref >60.00)
Glucose, Bld: 205 mg/dL — ABNORMAL HIGH (ref 70–99)
Potassium: 4.8 mEq/L (ref 3.5–5.1)
Sodium: 137 mEq/L (ref 135–145)

## 2020-01-27 NOTE — Progress Notes (Signed)
Patient ID: Brittney [name], female   DOB: [birth year], 74 y.o.   MRN: [id number]     Reason for Appointment: Followup of hypertension and diabetes  History of Present Illness    Type 2 DIABETES MELITUS, date of diagnosis: 1976      She has had long-standing diabetes taking insulin. Because of her difficulties with compliance using mealtime insulin she was given in Byetta in 2011 and subsequently Victoza in addition to her Lantus and this helped her control However she tends to be erratic with her day-to-day care and has not had adequate A1c levels in the past, usually over 7%  RECENT history:   Insulin regimen: Lantus 44 units daily in a.m. Humalog 5 units before dinner 3/7  Non-insulin hypoglycemic drugs: taking Byetta 5 mcg before meals, was on Metformin  850 twice a day   Her A1c had gone up to 8.6 and is last 8.1  Current glucose patterns and problems:  Blood sugars were reviewed from the last few days as her meter could not be downloaded  She has sporadic high readings around over 200 after various meals still  She is not taking any Humalog now despite instructions  Also takes Byetta in the morning at various times depending on when she is eating her first meal  Fasting readings appear to be fairly good    Side effects from medications:  Invokana: ? Headache.  Victoza ?  Swelling in neck      Monitors blood glucose: Up to 2-3 times a day     Glucometer: Accucheck         Blood Glucose readings from meter as above Previous readings:   PRE-MEAL Fasting Lunch  evening Bedtime Overall  Glucose range:  70-174   111-175    Mean/median: 116    130   POST-MEAL PC Breakfast PC Lunch PC Dinner  Glucose range:  146-207  ?  Mean/median:          Meals: 2-3 meals per day. . Dinner 6-7 pm, variable quantity and carbohydrates  Does have protein with breakfast consistently, has egg for protein   Wt Readings from Last 3 Encounters:  01/27/20 227 lb 6.4 oz  (103.1 kg)  12/30/19 226 lb 12.8 oz (102.9 kg)  10/01/19 229 lb 9.6 oz (104.1 kg)   GLYCEMIC CONTROL:  Lab Results  Component Value Date   HGBA1C 8.1 (A) 12/30/2019   HGBA1C 8.6 (A) 10/01/2019   HGBA1C 7.4 (A) 10/10/2018   Lab Results  Component Value Date   MICROALBUR <0.7 10/01/2019   LDLCALC 99 12/30/2019   CREATININE 1.42 (H) 12/30/2019   Lab Results  Component Value Date   FRUCTOSAMINE 342 (H) 09/08/2017   FRUCTOSAMINE 344 (H) 06/08/2017      Other problems discussed today: See review of systems   Allergies as of 01/27/2020      Reactions   Latex    Itching and breaking out      Medication List       Accurate as of January 27, 2020  2:18 PM. If you have any questions, ask your nurse or doctor.        Accu-Chek Aviva Plus test strip Generic drug: glucose blood USE TO TEST TWICE DAILY.   Accu-Chek Aviva Plus w/Device Kit Use to check blood sugar 2 times per day dx code E11.65   ALPRAZolam 0.5 MG tablet Commonly known as: XANAX Take 0.5 mg by mouth at bedtime. Takes half a tab at  bedtime   amLODipine 10 MG tablet Commonly known as: NORVASC TAKE (1) TABLET BY MOUTH ONCE DAILY.   B-D ULTRAFINE III SHORT PEN 31G X 8 MM Misc Generic drug: Insulin Pen Needle USE AS DIRECTED 2-3 TIMES A DAY.   Byetta 5 MCG Pen 5 MCG/0.02ML Sopn injection Generic drug: exenatide INJECT 5MCG INTO THE SKIN TWICE DAILY BEFORE MEALS.   cloNIDine 0.1 mg/24hr patch Commonly known as: CATAPRES - Dosed in mg/24 hr Place 1 patch (0.1 mg total) onto the skin once a week.   gabapentin 100 MG capsule Commonly known as: NEURONTIN Take 3 capsules (300 mg total) by mouth at bedtime.   HumaLOG KwikPen 100 UNIT/ML KwikPen Generic drug: insulin lispro INJECT 6 TO 7 UNITS SUBCUTANEOUSLY BEFORE SUPPER IF GLUCOSE IS HIGH.   hydrochlorothiazide 12.5 MG capsule Commonly known as: MICROZIDE Take 1 capsule (12.5 mg total) by mouth daily.   ibuprofen 200 MG tablet Commonly known as:  ADVIL Take 400 mg by mouth as needed.   Lantus SoloStar 100 UNIT/ML Solostar Pen Generic drug: insulin glargine Inject 40 Units into the skin daily. What changed: Another medication with the same name was removed. Continue taking this medication, and follow the directions you see here. Changed by: Elayne Snare, MD   loratadine 10 MG tablet Commonly known as: CLARITIN Take 10 mg by mouth daily as needed for allergies.   metFORMIN 850 MG tablet Commonly known as: GLUCOPHAGE TAKE (1) TABLET BY MOUTH TWICE DAILY.   metoprolol tartrate 50 MG tablet Commonly known as: LOPRESSOR TAKE 1/2 TABLET BY MOUTH TWICE DAILY.   multivitamin tablet Take 1 tablet by mouth once a week.   nystatin-triamcinolone ointment Commonly known as: MYCOLOG Apply 1 application topically 2 (two) times daily.   omeprazole 20 MG capsule Commonly known as: PRILOSEC Take 20 mg by mouth daily.   onetouch Engineer, agricultural Lancets lancets Use as instructed to check blood sugar 3 times daily. Dx Code E11.65   polyethylene glycol powder 17 GM/SCOOP powder Commonly known as: GLYCOLAX/MIRALAX Take 1 Container by mouth as needed for moderate constipation.   pravastatin 20 MG tablet Commonly known as: PRAVACHOL TAKE ONE TABLET BY MOUTH DAILY.   Santyl ointment Generic drug: collagenase   Sure Comfort Insulin Syringe 31G X 5/16" 0.3 ML Misc Generic drug: Insulin Syringe-Needle U-100 USE AS DIRECTED 2-3 TIMES A DAY.   torsemide 20 MG tablet Commonly known as: DEMADEX TAKE 1 TABLET BY MOUTH ONCE DAILY AS NEEDED.       Allergies:  Allergies  Allergen Reactions  . Latex     Itching and breaking out    Past Medical History:  Diagnosis Date  . Atrial fibrillation (HCC)    Paroxysmal; LVH; nl EF; onset in 1999  . Burning with urination 02/23/2016  . Diabetes mellitus    insulin; managed by Dr. Dwyane Dee  . 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 Hyperlipidemia    . Hypertension   . Hypothyroidism    history of goiter; nl TSH off medication  . Itching with irritation 02/27/2015  . Obesity 07/06/2012  . Palpitations    negative event recorder in 2009  . UTI (urinary tract infection) 02/23/2016    Past Surgical History:  Procedure Laterality Date  . BIOPSY THYROID    . CESAREAN SECTION    . CHOLECYSTECTOMY  11/95  . COLONOSCOPY  2007  . RETINAL DETACHMENT SURGERY  2009  . TUBAL LIGATION  1980's  Family History  Problem Relation Age of Onset  . Diabetes Mother   . Diabetes Daughter        gestational diabetes  . Cancer Father   . Thyroid disease Brother   . Other Son        MVA    Social History:  reports that she quit smoking about 33 years ago. Her smoking use included cigarettes. She started smoking about 53 years ago. She smoked 1.00 pack per day. She has never used smokeless tobacco. She reports that she does not drink alcohol or use drugs.  Review of Systems:   HYPERTENSION:  Previously followed by PCP, had been on Norvasc, MAXZIDE and Benicar  Because of high potassium and increased creatinine her Benicar and Maxzide have been stopped She is now on the clonidine patch 0.1 mg weekly along with HCTZ 12.5 mg daily  Although she was not told to stop Norvasc she has stopped this also No edema recently  She was having some itching at the site of the patch application but little less at the last application Occasionally feel a little dizziness/feeling of being off balance for a few seconds with clonidine but not interfering with her activities  Bp at home today 140/90, previously 140/76  Standing blood pressure today was 122/66  BP Readings from Last 3 Encounters:  01/27/20 (!) 150/70  12/30/19 140/60  10/01/19 130/68     HYPERLIPIDEMIA: The lipid abnormality consists of elevated LDL; she now reports that she has not taken her pravastatin for quite some time because of muscle cramping and some leg pain Her PCP had  recommended taking this 3 days a week but she has not taken any   Lab Results  Component Value Date   CHOL 191 12/30/2019   HDL 62.10 12/30/2019   LDLCALC 99 12/30/2019   TRIG 148.0 12/30/2019   CHOLHDL 3 12/30/2019    MULTINODULAR GOITER: She has had a long-standing multinodular goiter since at least 2005 which has been fairly stable clinically She does have history of occasional palpitations for years Metoprolol has been prescribed by cardiologist  She has no local pressure symptoms in the neck or problems with swallowing She is concerned about her thyroid being larger than before but her last exam indicates stable enlargement  Her goiter has been autonomous with no evidence of overt hyperthyroidism  with normal free T4 and free T3 levels; TSH has been consistently suppressed   Her I-131 uptake in 2003 was 17% and her  thyroid ultrasound in 2014 did not show distinct nodules but a relative increase in size  Lab Results  Component Value Date   TSH <0.01 (L) 12/30/2019   TSH <0.01 (L) 10/01/2019   FREET4 1.16 12/30/2019   FREET4 1.19 10/01/2019   Lab Results  Component Value Date   T3FREE 3.4 12/30/2019   T3FREE 3.7 10/01/2019   T3FREE 3.1 05/28/2019       Foot exam done 12/30/2019 She has some numbness in her toes which is mild and chronic and previously followed by podiatrist locally She is planning to establish with a new local podiatrist    Examination:   BP (!) 150/70 (BP Location: Left Arm, Patient Position: Sitting, Cuff Size: Normal)   Pulse 81   Ht 5' 7"  (1.702 m)   Wt 227 lb 6.4 oz (103.1 kg)   SpO2 98%   BMI 35.62 kg/m   Body mass index is 35.62 kg/m.    No pedal edema present   ASSESSMENT/  PLAN:  Diabetes Type 2 with obesity on insulin, Byetta and metformin  See history of present illness for detailed discussion of  current management, blood sugar patterns and problems identified  Her A1c was last 8.1  Blood sugars are mostly high  postprandially as discussed above but not inconsistent but based on her diet  Recommendations:  She needs to take Humalog with every meal that has carbohydrate and discussed what types of foods contain carbohydrate Consider increasing Byetta on next visit No change in Lantus  HYPERTENSION: Blood pressure is well-controlled now Although she has a lower blood pressure standing up today she is not symptomatic with systolic reading of 950 Has had hyperkalemia and worsening renal function with Benicar and this has been stopped for now  She will continue clonidine patch for now and may apply hydrocortisone to the site of application She will call if she is having lightheadedness or persistent itching  To have labs checked today     There are no Patient Instructions on file for this visit.    Elayne Snare 01/27/2020, 2:18 PM

## 2020-01-28 ENCOUNTER — Ambulatory Visit (INDEPENDENT_AMBULATORY_CARE_PROVIDER_SITE_OTHER): Payer: Medicare Other

## 2020-01-28 ENCOUNTER — Encounter: Payer: Self-pay | Admitting: Sports Medicine

## 2020-01-28 ENCOUNTER — Ambulatory Visit (INDEPENDENT_AMBULATORY_CARE_PROVIDER_SITE_OTHER): Payer: Medicare Other | Admitting: Sports Medicine

## 2020-01-28 ENCOUNTER — Other Ambulatory Visit: Payer: Self-pay | Admitting: Sports Medicine

## 2020-01-28 VITALS — BP 179/82 | Temp 97.1°F | Resp 16

## 2020-01-28 DIAGNOSIS — L84 Corns and callosities: Secondary | ICD-10-CM

## 2020-01-28 DIAGNOSIS — M79672 Pain in left foot: Secondary | ICD-10-CM | POA: Diagnosis not present

## 2020-01-28 DIAGNOSIS — M79674 Pain in right toe(s): Secondary | ICD-10-CM

## 2020-01-28 DIAGNOSIS — E11610 Type 2 diabetes mellitus with diabetic neuropathic arthropathy: Secondary | ICD-10-CM

## 2020-01-28 DIAGNOSIS — M79671 Pain in right foot: Secondary | ICD-10-CM

## 2020-01-28 DIAGNOSIS — M2141 Flat foot [pes planus] (acquired), right foot: Secondary | ICD-10-CM

## 2020-01-28 DIAGNOSIS — M2040 Other hammer toe(s) (acquired), unspecified foot: Secondary | ICD-10-CM | POA: Diagnosis not present

## 2020-01-28 DIAGNOSIS — B351 Tinea unguium: Secondary | ICD-10-CM

## 2020-01-28 DIAGNOSIS — M79675 Pain in left toe(s): Secondary | ICD-10-CM

## 2020-01-28 DIAGNOSIS — M2142 Flat foot [pes planus] (acquired), left foot: Secondary | ICD-10-CM

## 2020-01-28 NOTE — Progress Notes (Signed)
Subjective: Brittney [name] is a 74 y.o. female patient with history of diabetes who presents to office today complaining of long,mildly painful nails and callus  while ambulating in shoes; unable to trim. Patient states that the glucose reading this morning was 160 mg/dl. Patient denies any new changes in medication or new problems except a history of charcot was treated by [facility] and they stopped taking her insurance and had surgery years ago and left foot is always tender. No other issues noted.   Review of Systems  All other systems reviewed and are negative.  Patient Active Problem List   Diagnosis Date Noted  . UTI (urinary tract infection) 02/23/2016  . Burning with urination 02/23/2016  . Itching with irritation 02/27/2015  . Goiter diffuse, adenomatous 02/05/2014  . Polyneuropathy in diabetes(357.2) 09/25/2013  . Obesity 07/06/2012  . 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 Subclinical hyperthyroidism 06/30/2009  . Type II diabetes mellitus, uncontrolled ([place]) 06/30/2009  . Hyperlipidemia 06/30/2009  . Essential hypertension, benign 06/30/2009  . PAROXYSMAL ATRIAL FIBRILLATION 06/30/2009   Current Outpatient Medications on File Prior to Visit  Medication Sig Dispense Refill  . ACCU-CHEK AVIVA PLUS test strip USE TO TEST TWICE DAILY. 100 each 5  . ALPRAZolam (XANAX) 0.5 MG tablet Take 0.5 mg by mouth at bedtime. Takes half a tab at bedtime    . B-D ULTRAFINE III SHORT PEN 31G X 8 MM MISC USE AS DIRECTED 2-3 TIMES A DAY. 100 each 11  . BAYER MICROLET LANCETS lancets Use as instructed to check blood sugar 3 times daily. Dx Code E11.65 100 each 3  . Blood Glucose Monitoring Suppl (ACCU-CHEK AVIVA PLUS) W/DEVICE KIT Use to check blood sugar 2 times per day dx code E11.65 1 kit 0  . cloNIDine (CATAPRES - DOSED IN MG/24 HR) 0.1 mg/24hr patch Place 1 patch (0.1 mg total) onto the skin once a week. 4 patch 2  . exenatide (BYETTA 5 MCG PEN) 5 MCG/0.02ML SOPN injection  INJECT 5MCG INTO THE SKIN TWICE DAILY BEFORE MEALS. 1.2 mL 3  . gabapentin (NEURONTIN) 100 MG capsule Take 3 capsules (300 mg total) by mouth at bedtime. 90 capsule 0  . HUMALOG KWIKPEN 100 UNIT/ML KwikPen INJECT 6 TO 7 UNITS SUBCUTANEOUSLY BEFORE SUPPER IF GLUCOSE IS HIGH. 15 mL 0  . hydrochlorothiazide (MICROZIDE) 12.5 MG capsule Take 1 capsule (12.5 mg total) by mouth daily. 30 capsule 2  . insulin glargine (LANTUS SOLOSTAR) 100 UNIT/ML Solostar Pen Inject 40 Units into the skin daily.    . Lancets (ONETOUCH ULTRASOFT) lancets     . loratadine (CLARITIN) 10 MG tablet Take 10 mg by mouth daily as needed for allergies.    . metFORMIN (GLUCOPHAGE) 850 MG tablet TAKE (1) TABLET BY MOUTH TWICE DAILY. 60 tablet 0  . metoprolol tartrate (LOPRESSOR) 50 MG tablet TAKE 1/2 TABLET BY MOUTH TWICE DAILY. 30 tablet 6  . Multiple Vitamin (MULTIVITAMIN) tablet Take 1 tablet by mouth once a week.     . nystatin-triamcinolone ointment (MYCOLOG) Apply 1 application topically 2 (two) times daily. 30 g 2  . omeprazole (PRILOSEC) 20 MG capsule Take 20 mg by mouth daily.      . polyethylene glycol powder (GLYCOLAX/MIRALAX) powder Take 1 Container by mouth as needed for moderate constipation.     Marland Kitchen SANTYL ointment     . SURE COMFORT INSULIN SYRINGE 31G X 5/16" 0.3 ML MISC USE AS DIRECTED 2-3 TIMES A DAY. 100 each 0  .  torsemide (DEMADEX) 20 MG tablet TAKE 1 TABLET BY MOUTH ONCE DAILY AS NEEDED. 30 tablet 6   No current facility-administered medications on file prior to visit.   Allergies  Allergen Reactions  . Latex     Itching and breaking out  . Pravastatin     Pt stated, "Made my muscle ache"    Recent Results (from the past 2160 hour(s))  HM DIABETES EYE EXAM     Status: Abnormal   Collection Time: 12/26/19 12:00 AM  Result Value Ref Range   HM Diabetic Eye Exam Retinopathy (A) No Retinopathy  POCT HgB A1C     Status: Abnormal   Collection Time: 12/30/19 10:02 AM  Result Value Ref Range    Hemoglobin A1C 8.1 (A) 4.0 - 5.6 %   HbA1c POC (<> result, manual entry)     HbA1c, POC (prediabetic range)     HbA1c, POC (controlled diabetic range)    T3, free     Status: None   Collection Time: 12/30/19 10:39 AM  Result Value Ref Range   T3, Free 3.4 2.3 - 4.2 pg/mL  T4, free     Status: None   Collection Time: 12/30/19 10:39 AM  Result Value Ref Range   Free T4 1.16 0.60 - 1.60 ng/dL    Comment: Specimens from patients who are undergoing biotin therapy and /or ingesting biotin supplements may contain high levels of biotin.  The higher biotin concentration in these specimens interferes with this Free T4 assay.  Specimens that contain high levels  of biotin may cause false high results for this Free T4 assay.  Please interpret results in light of the total clinical presentation of the patient.    TSH     Status: Abnormal   Collection Time: 12/30/19 10:39 AM  Result Value Ref Range   TSH <0.01 (L) 0.35 - 4.50 uIU/mL  Lipid panel     Status: None   Collection Time: 12/30/19 10:39 AM  Result Value Ref Range   Cholesterol 191 0 - 200 mg/dL    Comment: ATP III Classification       Desirable:  < 200 mg/dL               Borderline High:  200 - 239 mg/dL          High:  > = 240 mg/dL   Triglycerides 148.0 0.0 - 149.0 mg/dL    Comment: Normal:  <150 mg/dLBorderline High:  150 - 199 mg/dL   HDL 62.10 >39.00 mg/dL   VLDL 29.6 0.0 - 40.0 mg/dL   LDL Cholesterol 99 0 - 99 mg/dL   Total CHOL/HDL Ratio 3     Comment:                Men          Women1/2 Average Risk     3.4          3.3Average Risk          5.0          4.42X Average Risk          9.6          7.13X Average Risk          15.0          11.0                       NonHDL 128.51     Comment: NOTE:  Non-HDL goal should be 30 mg/dL higher than patient's LDL goal (i.e. LDL goal of < 70 mg/dL, would have non-HDL goal of < 100 mg/dL)  Comprehensive metabolic panel     Status: Abnormal   Collection Time: 12/30/19 10:39 AM  Result Value  Ref Range   Sodium 134 (L) 135 - 145 mEq/L   Potassium 5.9 No hemolysis seen (H) 3.5 - 5.1 mEq/L   Chloride 104 96 - 112 mEq/L   CO2 24 19 - 32 mEq/L   Glucose, Bld 172 (H) 70 - 99 mg/dL   BUN 24 (H) 6 - 23 mg/dL   Creatinine, Ser 1.42 (H) 0.40 - 1.20 mg/dL   Total Bilirubin 0.4 0.2 - 1.2 mg/dL   Alkaline Phosphatase 92 39 - 117 U/L   AST 14 0 - 37 U/L   ALT 12 0 - 35 U/L   Total Protein 7.9 6.0 - 8.3 g/dL   Albumin 4.2 3.5 - 5.2 g/dL   GFR 43.92 (L) >60.00 mL/min   Calcium 10.0 8.4 - 10.5 mg/dL  Basic metabolic panel     Status: Abnormal   Collection Time: 01/27/20  2:36 PM  Result Value Ref Range   Sodium 137 135 - 145 mEq/L   Potassium 4.8 3.5 - 5.1 mEq/L   Chloride 103 96 - 112 mEq/L   CO2 29 19 - 32 mEq/L   Glucose, Bld 205 (H) 70 - 99 mg/dL   BUN 20 6 - 23 mg/dL   Creatinine, Ser 1.17 0.40 - 1.20 mg/dL   GFR 54.91 (L) >60.00 mL/min   Calcium 9.7 8.4 - 10.5 mg/dL    Objective: General: Patient is awake, alert, and oriented x 3 and in no acute distress.  Integument: Skin is warm, dry and supple bilateral. Nails are tender, long, thickened and  dystrophic with subungual debris, consistent with onychomycosis, 1-5 bilateral with small fragments of nails from previous removal. No signs of infection.+ callus styloid left foot plantar aspect. Remaining integument unremarkable.  Vasculature:  Dorsalis Pedis pulse 1/4 bilateral. Posterior Tibial pulse  1/4 bilateral.  Capillary fill time <3 sec 1-5 bilateral. Positive hair growth to the level of the digits. Temperature gradient within normal limits. No varicosities present bilateral. No edema present bilateral.   Neurology: The patient has absent sensation measured with a 5.07/10g Semmes Weinstein Monofilament at all pedal sites bilateral. Vibratory sensation severely diminished bilateral with tuning fork. No Babinski sign present bilateral.   Musculoskeletal: Asymptomatic hammertoe and pes planus pedal deformities noted  bilateral. + Charcot on left.  Muscular strength 4/5 on left and 5/5 on right in all lower extremity muscular groups bilateral without pain on range of motion . No tenderness with calf compression bilateral.  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 -Discussed and educated patient on diabetic foot care, especially with  regards to the vascular, neurological and musculoskeletal systems.  -Stressed the importance of good  glycemic control and the detriment of not  controlling glucose levels in relation to the foot. -Mechanically debrided all nails 1-5 bilateral using sterile nail nipper and filed with dremel without incident  -Mechanically debrided callus using sterile chisel blade -Safe step diabetic shoe order form was completed; office to contact primary care for approval / certification;  Office to arrange shoe fitting and dispensing. -Answered all patient questions -Patient to return  in 3 months for at risk foot care and for shoes when scheduled  -Patient advised to call the office if any problems or questions arise in the meantime.  Landis Martins, DPM

## 2020-01-29 ENCOUNTER — Telehealth: Payer: Self-pay | Admitting: Endocrinology

## 2020-01-29 NOTE — Telephone Encounter (Signed)
Should pt wait and apply new clonidine patch tomorrow or apply the new one today?

## 2020-01-29 NOTE — Telephone Encounter (Signed)
Patient called office leaving voicemail that her BP patch for Clonidine is supposed to be changed every 7 days and she said it came off sometime during the night last night. She's asking [place] to give her a call to let her know if its going to affect her BP? She is supposed to change it tomorrow. Ph# [phone].

## 2020-01-29 NOTE — Telephone Encounter (Signed)
Called pt and informed her that she needs to place a new patch on whenever the old one falls off. Pt verbalized understanding.

## 2020-01-29 NOTE — Telephone Encounter (Signed)
If the patch falls off she needs to put another one on right away

## 2020-02-12 ENCOUNTER — Telehealth: Payer: Self-pay

## 2020-02-12 ENCOUNTER — Other Ambulatory Visit: Payer: Self-pay

## 2020-02-12 MED ORDER — CLONIDINE HCL 0.1 MG PO TABS: 0.1000 mg | ORAL_TABLET | Freq: Two times a day (BID) | ORAL | 2 refills | Status: DC

## 2020-02-12 NOTE — Telephone Encounter (Signed)
Patient is requesting clonidine pills be called in instead of the patch because she feels the patches do not last as long as they should please call her on her cell when this has been done [phone]

## 2020-02-12 NOTE — Telephone Encounter (Signed)
Clonidine 0.1mg  bid

## 2020-02-12 NOTE — Telephone Encounter (Signed)
Rx sent 

## 2020-02-12 NOTE — Telephone Encounter (Signed)
Is this okay? If so, what dose?

## 2020-02-13 ENCOUNTER — Other Ambulatory Visit: Payer: Self-pay | Admitting: Endocrinology

## 2020-02-13 NOTE — Telephone Encounter (Signed)
Called pt and she just wanted to verify that she should take the pills once the patches are completely finished. She was informed that she can stop the patches anytime, and begin taking the pills as instructed on the bottle.

## 2020-02-13 NOTE — Telephone Encounter (Signed)
Patient requests that Olen Cordial call her at ph# [phone]

## 2020-02-14 ENCOUNTER — Other Ambulatory Visit: Payer: Self-pay

## 2020-02-14 ENCOUNTER — Other Ambulatory Visit: Payer: Medicare Other | Admitting: Orthotics

## 2020-02-17 ENCOUNTER — Other Ambulatory Visit: Payer: Self-pay | Admitting: Endocrinology

## 2020-02-19 DIAGNOSIS — N183 Chronic kidney disease, stage 3 unspecified: Secondary | ICD-10-CM | POA: Diagnosis not present

## 2020-02-19 DIAGNOSIS — E1122 Type 2 diabetes mellitus with diabetic chronic kidney disease: Secondary | ICD-10-CM | POA: Diagnosis not present

## 2020-02-19 DIAGNOSIS — E785 Hyperlipidemia, unspecified: Secondary | ICD-10-CM | POA: Diagnosis not present

## 2020-02-19 DIAGNOSIS — I129 Hypertensive chronic kidney disease with stage 1 through stage 4 chronic kidney disease, or unspecified chronic kidney disease: Secondary | ICD-10-CM | POA: Diagnosis not present

## 2020-02-28 ENCOUNTER — Other Ambulatory Visit: Payer: Self-pay | Admitting: Endocrinology

## 2020-03-16 DIAGNOSIS — E1165 Type 2 diabetes mellitus with hyperglycemia: Secondary | ICD-10-CM | POA: Diagnosis not present

## 2020-03-16 DIAGNOSIS — R21 Rash and other nonspecific skin eruption: Secondary | ICD-10-CM | POA: Diagnosis not present

## 2020-03-16 DIAGNOSIS — E785 Hyperlipidemia, unspecified: Secondary | ICD-10-CM | POA: Diagnosis not present

## 2020-03-16 DIAGNOSIS — I10 Essential (primary) hypertension: Secondary | ICD-10-CM | POA: Diagnosis not present

## 2020-03-24 ENCOUNTER — Ambulatory Visit: Payer: Medicare Other | Admitting: Cardiovascular Disease

## 2020-03-30 ENCOUNTER — Ambulatory Visit (INDEPENDENT_AMBULATORY_CARE_PROVIDER_SITE_OTHER): Payer: Medicare Other | Admitting: Endocrinology

## 2020-03-30 ENCOUNTER — Encounter: Payer: Self-pay | Admitting: Endocrinology

## 2020-03-30 ENCOUNTER — Other Ambulatory Visit: Payer: Self-pay

## 2020-03-30 VITALS — BP 136/66 | HR 96 | Temp 97.8°F | Wt 224.6 lb

## 2020-03-30 DIAGNOSIS — E042 Nontoxic multinodular goiter: Secondary | ICD-10-CM | POA: Diagnosis not present

## 2020-03-30 DIAGNOSIS — I10 Essential (primary) hypertension: Secondary | ICD-10-CM

## 2020-03-30 DIAGNOSIS — E1165 Type 2 diabetes mellitus with hyperglycemia: Secondary | ICD-10-CM

## 2020-03-30 LAB — COMPREHENSIVE METABOLIC PANEL
ALT: 11 U/L (ref 0–35)
AST: 13 U/L (ref 0–37)
Albumin: 4.1 g/dL (ref 3.5–5.2)
Alkaline Phosphatase: 83 U/L (ref 39–117)
BUN: 27 mg/dL — ABNORMAL HIGH (ref 6–23)
CO2: 29 mEq/L (ref 19–32)
Calcium: 10.1 mg/dL (ref 8.4–10.5)
Chloride: 100 mEq/L (ref 96–112)
Creatinine, Ser: 1.04 mg/dL (ref 0.40–1.20)
GFR: 62.87 mL/min (ref >60.00)
Glucose, Bld: 165 mg/dL — ABNORMAL HIGH (ref 70–99)
Potassium: 4.7 mEq/L (ref 3.5–5.1)
Sodium: 136 mEq/L (ref 135–145)
Total Bilirubin: 0.4 mg/dL (ref 0.2–1.2)
Total Protein: 7.5 g/dL (ref 6.0–8.3)

## 2020-03-30 LAB — POCT GLYCOSYLATED HEMOGLOBIN (HGB A1C): Hemoglobin A1C: 8.2 % — AB (ref 4.0–5.6)

## 2020-03-30 MED ORDER — EXENATIDE 10 MCG/0.04ML ~~LOC~~ SOPN: 10.0000 ug | PEN_INJECTOR | Freq: Two times a day (BID) | SUBCUTANEOUS | 3 refills | Status: DC

## 2020-03-30 NOTE — Progress Notes (Signed)
Patient ID: Brittney [name], female   DOB: 1946/06/25, 74 y.o.   MRN: [id number]     Reason for Appointment: Followup of hypertension and diabetes  History of Present Illness    Type 2 DIABETES MELITUS, date of diagnosis: 1976      She has had long-standing diabetes taking insulin. Because of her difficulties with compliance using mealtime insulin she was given in Byetta in 2011 and subsequently Victoza in addition to her Lantus and this helped her control However she tends to be erratic with her day-to-day care and has not had adequate A1c levels in the past, usually over 7%  RECENT history:   Insulin regimen: Lantus 44 units daily in a.m. Humalog 5 units before dinner  Non-insulin hypoglycemic drugs: taking Byetta 0-5 mcg before meals,   Metformin  850 twice a day   Her A1c had gone up to 8.6 and is last 8.1  Current glucose patterns and problems:  Blood sugars have been checked mostly in the mornings again  She has done some readings in the evenings last month but not clear which readings are before and which are after eating  She does not really understand what her blood sugar patterns are also  As before she is not always taking her Humalog before supper as directed  Also not clear if when she takes her Humalog 5 to 6 units that her blood sugar control postprandially  She has been told to make sure she covers all carbohydrate containing meals at least in the evening  Today had oatmeal and did not take either her Humalog or her Byetta  She was out of her Byetta for couple of weeks and not clear why, she has no difficulty with insurance coverage  She thinks she is trying to take her BYETTA 30-minute before eating and she can get it regularly  Fasting readings are again fairly good although has some fluctuations  No hypoglycemia documented  She is only able to walk a little but has lost about 3 pounds    Side effects from medications:  Invokana: ?  Headache.  Victoza ?  Swelling in neck      Monitors blood glucose: Up to 2-3 times a day     Glucometer: Accucheck         Blood Glucose from download   PRE-MEAL Fasting Lunch  evening Bedtime Overall  Glucose range: 83-187  85-285    Mean/median: 132   194  145   Previous readings  PRE-MEAL Fasting Lunch  evening Bedtime Overall  Glucose range:  70-174   111-175    Mean/median: 116    130   POST-MEAL PC Breakfast PC Lunch PC Dinner  Glucose range:  146-207  ?  Mean/median:          Meals: 2-3 meals per day. . Dinner 6-7 pm, variable quantity and carbohydrates  Does have protein with breakfast consistently, has egg for protein   Wt Readings from Last 3 Encounters:  03/30/20 224 lb 9.6 oz (101.9 kg)  01/27/20 227 lb 6.4 oz (103.1 kg)  12/30/19 226 lb 12.8 oz (102.9 kg)   GLYCEMIC CONTROL:  Lab Results  Component Value Date   HGBA1C 8.2 (A) 03/30/2020   HGBA1C 8.1 (A) 12/30/2019   HGBA1C 8.6 (A) 10/01/2019   Lab Results  Component Value Date   MICROALBUR <0.7 10/01/2019   [place] 99 12/30/2019   CREATININE 1.17 01/27/2020   Lab Results  Component Value Date  FRUCTOSAMINE 342 (H) 09/08/2017   FRUCTOSAMINE 344 (H) 06/08/2017      Other problems discussed today: See review of systems   Allergies as of 03/30/2020      Reactions   Latex    Itching and breaking out   Pravastatin    Pt stated, "Made my muscle ache"      Medication List       Accurate as of Mar 30, 2020 10:17 AM. If you have any questions, ask your nurse or doctor.        STOP taking these medications   loratadine 10 MG tablet Commonly known as: CLARITIN Stopped by: Elayne Snare, MD   nystatin-triamcinolone ointment Commonly known as: MYCOLOG Stopped by: Elayne Snare, MD   Santyl ointment Generic drug: collagenase Stopped by: Elayne Snare, MD   torsemide 20 MG tablet Commonly known as: [place] by: Elayne Snare, MD     TAKE these medications   Accu-Chek Aviva Plus test  strip Generic drug: glucose blood USE TO TEST TWICE DAILY.   Accu-Chek Aviva Plus w/Device Kit Use to check blood sugar 2 times per day dx code E11.65   ALPRAZolam 0.5 MG tablet Commonly known as: XANAX Take 0.5 mg by mouth at bedtime. Takes half a tab at bedtime   B-D ULTRAFINE III SHORT PEN 31G X 8 MM Misc Generic drug: Insulin Pen Needle USE AS DIRECTED 2-3 TIMES A DAY.   Byetta 5 MCG Pen 5 MCG/0.02ML Sopn injection Generic drug: exenatide INJECT 5MCG INTO THE SKIN TWICE DAILY BEFORE MEALS.   cloNIDine 0.1 MG tablet Commonly known as: CATAPRES Take 1 tablet (0.1 mg total) by mouth 2 (two) times daily.   gabapentin 100 MG capsule Commonly known as: NEURONTIN Take 3 capsules (300 mg total) by mouth at bedtime.   HumaLOG KwikPen 100 UNIT/ML KwikPen Generic drug: insulin lispro INJECT 6 TO 7 UNITS SUBCUTANEOUSLY BEFORE SUPPER IF GLUCOSE IS HIGH.   hydrochlorothiazide 12.5 MG capsule Commonly known as: MICROZIDE TAKE ONE CAPSULE BY MOUTH ONCE DAILY.   Lantus SoloStar 100 UNIT/ML Solostar Pen Generic drug: insulin glargine INJECT 44 UNITS SUBCUTANEOUSLY ONCE EVERY MORNING.   metFORMIN 850 MG tablet Commonly known as: GLUCOPHAGE TAKE (1) TABLET BY MOUTH TWICE DAILY.   metoprolol tartrate 50 MG tablet Commonly known as: LOPRESSOR TAKE 1/2 TABLET BY MOUTH TWICE DAILY.   multivitamin tablet Take 1 tablet by mouth once a week.   omeprazole 20 MG capsule Commonly known as: PRILOSEC Take 20 mg by mouth daily.   onetouch ultrasoft lancets What changed: Another medication with the same name was removed. Continue taking this medication, and follow the directions you see here. Changed by: Elayne Snare, MD   polyethylene glycol powder 17 GM/SCOOP powder Commonly known as: GLYCOLAX/MIRALAX Take 1 Container by mouth as needed for moderate constipation.   Sure Comfort Insulin Syringe 31G X 5/16" 0.3 ML Misc Generic drug: Insulin Syringe-Needle U-100 USE AS DIRECTED 2-3  TIMES A DAY.       Allergies:  Allergies  Allergen Reactions  . Latex     Itching and breaking out  . Pravastatin     Pt stated, "Made my muscle ache"    Past Medical History:  Diagnosis Date  . Atrial fibrillation (HCC)    Paroxysmal; LVH; nl EF; onset in 1999  . Burning with urination 02/23/2016  . Diabetes mellitus    insulin; managed by Dr. Dwyane Dee  . 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  Hyperlipidemia   . Hypertension   . Hypothyroidism    history of goiter; nl TSH off medication  . Itching with irritation 02/27/2015  . Obesity 07/06/2012  . Palpitations    negative event recorder in 2009  . UTI (urinary tract infection) 02/23/2016    Past Surgical History:  Procedure Laterality Date  . BIOPSY THYROID    . CESAREAN SECTION    . CHOLECYSTECTOMY  11/95  . COLONOSCOPY  2007  . RETINAL DETACHMENT SURGERY  2009  . TUBAL LIGATION  1980's    Family History  Problem Relation Age of Onset  . Diabetes Mother   . Diabetes Daughter        gestational diabetes  . Cancer Father   . Thyroid disease Brother   . Other Son        MVA    Social History:  reports that she quit smoking about 33 years ago. Her smoking use included cigarettes. She started smoking about 53 years ago. She smoked 1.00 pack per day. She has never used smokeless tobacco. She reports that she does not drink alcohol or use drugs.  Review of Systems:   HYPERTENSION:  Previously followed by PCP, had been on Norvasc, MAXZIDE and Benicar  Because of high potassium and increased creatinine her Benicar and Maxzide have been stopped She is now on the clonidine 0.1 mg bid along with HCTZ 12.5 mg daily Previously on the patch she had some itching at the site  She was having some itching at the site of the patch application   Occasionally feel a little dizziness/feeling of being off balance transiently but minor and no change  Bp at home 951 systolic recently  BP Readings from  Last 3 Encounters:  03/30/20 136/66  01/28/20 (!) 179/82  01/27/20 122/66     HYPERLIPIDEMIA: The lipid abnormality consists of elevated LDL; she now reports that she has not taken her pravastatin for quite some time because of muscle cramping and some leg pain Her PCP had recommended taking this 3 days a week with some improvement   Lab Results  Component Value Date   CHOL 191 12/30/2019   HDL 62.10 12/30/2019   LDLCALC 99 12/30/2019   TRIG 148.0 12/30/2019   CHOLHDL 3 12/30/2019    MULTINODULAR GOITER: She has had a long-standing multinodular goiter since at least 2005 which has been fairly stable clinically She does have history of occasional palpitations for years Metoprolol has been prescribed by cardiologist  She has no local pressure symptoms in the neck or any significant difficulty with swallowing  Her goiter has been autonomous with no evidence of overt hyperthyroidism  with normal free T4 and free T3 levels; TSH has been consistently suppressed   Her I-131 uptake in 2003 was 17% and her  thyroid ultrasound in 2014 did not show distinct nodules but a relative increase in size  Lab Results  Component Value Date   TSH <0.01 (L) 12/30/2019   TSH <0.01 (L) 10/01/2019   FREET4 1.16 12/30/2019   FREET4 1.19 10/01/2019   Lab Results  Component Value Date   T3FREE 3.4 12/30/2019   T3FREE 3.7 10/01/2019   T3FREE 3.1 05/28/2019       Foot exam done 12/30/2019 She has some numbness in her toes which is mild and chronic  This is better with gabapentin  She complains of feeling depressed for the last 3 months but has had no treatment, not clear if she has discussed this fully with  her PCP, only taking Xanax as needed   Examination:   BP 136/66 (BP Location: Left Arm, Patient Position: Sitting, Cuff Size: Large)   Pulse 96   Temp 97.8 F (36.6 C) (Oral)   Wt 224 lb 9.6 oz (101.9 kg)   SpO2 98%   BMI 35.18 kg/m   Body mass index is 35.18 kg/m.    No pedal  edema present   ASSESSMENT/ PLAN:  Diabetes Type 2 with obesity on insulin, Byetta and metformin  See history of present illness for detailed discussion of  current management, blood sugar patterns and problems identified  Her A1c is again over 8%  Blood sugars are frequently high postprandially but is not monitoring these regularly Although she is trying to eat relatively healthy meals not clear why she has blood sugars as high as 285, may be related to portions of carbohydrates Despite taking oral iron she probably does still need mealtime insulin  Recommendations:  She needs to take Humalog with every meal that has carbohydrate and discussed what types of foods contain carbohydrate Needs to increase Byetta to 10 mcg, she can use 2 shots of the 5 mcg to start with She will keep a record of her insulin doses at mealtimes and blood sugars 2 hours later at least at suppertime May need up to 10 units of Humalog to cover evening meal based on her diet and make sure she tries to adjust it further if blood sugars are mostly over 200 after eating Based on her sugars after breakfast lunch may need to add Humalog at those meals also We will also check fructosamine to verify accuracy of her A1c  HYPERTENSION: Blood pressure is consistently near normal However unclear whether her home meter is accurate as she still reports some high readings   To have labs checked today     There are no Patient Instructions on file for this visit.    Elayne Snare 03/30/2020, 10:17 AM

## 2020-03-30 NOTE — Progress Notes (Signed)
Please call to let patient know that the kidney test results are normal and no further action needed

## 2020-03-30 NOTE — Patient Instructions (Addendum)
Check blood sugars on waking up 4 days a week  Also check blood sugars about 2 hours after meals and do this after different meals by rotation  Recommended blood sugar levels on waking up are 90-130 and about 2 hours after meal is 130-160  Please bring your blood sugar monitor to each visit, thank you  Take upto 10 Novolog at supper to help keep sugar after meal < 180  Byetta 2 of 5ug 2x daily

## 2020-03-31 ENCOUNTER — Telehealth: Payer: Self-pay | Admitting: Endocrinology

## 2020-03-31 LAB — FRUCTOSAMINE: Fructosamine: 345 umol/L — ABNORMAL HIGH (ref 0–285)

## 2020-03-31 NOTE — Telephone Encounter (Signed)
Patient returned called regarding most recent lab results.  Requesting a call back at [phone]

## 2020-03-31 NOTE — Telephone Encounter (Signed)
Please see result note 

## 2020-03-31 NOTE — Telephone Encounter (Signed)
Noted  

## 2020-04-24 ENCOUNTER — Other Ambulatory Visit: Payer: Self-pay | Admitting: Endocrinology

## 2020-05-05 ENCOUNTER — Ambulatory Visit (INDEPENDENT_AMBULATORY_CARE_PROVIDER_SITE_OTHER): Payer: Medicare Other | Admitting: Orthotics

## 2020-05-05 ENCOUNTER — Encounter: Payer: Self-pay | Admitting: Sports Medicine

## 2020-05-05 ENCOUNTER — Ambulatory Visit (INDEPENDENT_AMBULATORY_CARE_PROVIDER_SITE_OTHER): Payer: Medicare Other | Admitting: Sports Medicine

## 2020-05-05 ENCOUNTER — Other Ambulatory Visit: Payer: Self-pay

## 2020-05-05 VITALS — Temp 95.7°F

## 2020-05-05 DIAGNOSIS — B351 Tinea unguium: Secondary | ICD-10-CM | POA: Diagnosis not present

## 2020-05-05 DIAGNOSIS — M2141 Flat foot [pes planus] (acquired), right foot: Secondary | ICD-10-CM | POA: Diagnosis not present

## 2020-05-05 DIAGNOSIS — E11610 Type 2 diabetes mellitus with diabetic neuropathic arthropathy: Secondary | ICD-10-CM

## 2020-05-05 DIAGNOSIS — M2142 Flat foot [pes planus] (acquired), left foot: Secondary | ICD-10-CM

## 2020-05-05 DIAGNOSIS — M2040 Other hammer toe(s) (acquired), unspecified foot: Secondary | ICD-10-CM

## 2020-05-05 DIAGNOSIS — L84 Corns and callosities: Secondary | ICD-10-CM

## 2020-05-05 DIAGNOSIS — M79675 Pain in left toe(s): Secondary | ICD-10-CM | POA: Diagnosis not present

## 2020-05-05 DIAGNOSIS — M79671 Pain in right foot: Secondary | ICD-10-CM

## 2020-05-05 DIAGNOSIS — M79674 Pain in right toe(s): Secondary | ICD-10-CM

## 2020-05-05 NOTE — Progress Notes (Signed)
Subjective: Brittney [name] is a 74 y.o. female patient with history of diabetes who presents to office today complaining of long,mildly painful nails and callus  while ambulating in shoes; unable to trim. Patient states that the glucose reading this morning was not recorded but last a1c was over 7. Patient is also here for pick up diabetic shoes and is to be seen by Liliane Channel, pedorthist. No other issues noted.    Patient Active Problem List   Diagnosis Date Noted  . UTI (urinary tract infection) 02/23/2016  . Burning with urination 02/23/2016  . Itching with irritation 02/27/2015  . Goiter diffuse, adenomatous 02/05/2014  . Polyneuropathy in diabetes(357.2) 09/25/2013  . Obesity 07/06/2012  . 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 Subclinical hyperthyroidism 06/30/2009  . Type II diabetes mellitus, uncontrolled ([place]) 06/30/2009  . Hyperlipidemia 06/30/2009  . Essential hypertension, benign 06/30/2009  . PAROXYSMAL ATRIAL FIBRILLATION 06/30/2009   Current Outpatient Medications on File Prior to Visit  Medication Sig Dispense Refill  . ACCU-CHEK AVIVA PLUS test strip USE TO TEST TWICE DAILY. 100 each 5  . ALPRAZolam (XANAX) 0.5 MG tablet Take 0.5 mg by mouth at bedtime. Takes half a tab at bedtime    . B-D ULTRAFINE III SHORT PEN 31G X 8 MM MISC USE AS DIRECTED 2-3 TIMES A DAY. 100 each 11  . Blood Glucose Monitoring Suppl (ACCU-CHEK AVIVA PLUS) W/DEVICE KIT Use to check blood sugar 2 times per day dx code E11.65 1 kit 0  . cloNIDine (CATAPRES) 0.1 MG tablet Take 1 tablet (0.1 mg total) by mouth 2 (two) times daily. 60 tablet 2  . exenatide (BYETTA) 10 MCG/0.04ML SOPN injection Inject 0.04 mLs (10 mcg total) into the skin 2 (two) times daily with a meal. Inject 30 minutes before eating 1 pen 3  . gabapentin (NEURONTIN) 100 MG capsule Take 3 capsules (300 mg total) by mouth at bedtime. 90 capsule 0  . HUMALOG KWIKPEN 100 UNIT/ML KwikPen INJECT 6 TO 7 UNITS SUBCUTANEOUSLY BEFORE SUPPER IF  GLUCOSE IS HIGH. 15 mL 0  . hydrochlorothiazide (MICROZIDE) 12.5 MG capsule TAKE ONE CAPSULE BY MOUTH ONCE DAILY. 30 capsule 2  . Lancets (ONETOUCH ULTRASOFT) lancets     . LANTUS SOLOSTAR 100 UNIT/ML Solostar Pen INJECT 44 UNITS SUBCUTANEOUSLY ONCE EVERY MORNING. 15 mL 2  . metFORMIN (GLUCOPHAGE) 850 MG tablet TAKE (1) TABLET BY MOUTH TWICE DAILY. 60 tablet 0  . metoprolol tartrate (LOPRESSOR) 50 MG tablet TAKE 1/2 TABLET BY MOUTH TWICE DAILY. 30 tablet 6  . Multiple Vitamin (MULTIVITAMIN) tablet Take 1 tablet by mouth once a week.     Marland Kitchen omeprazole (PRILOSEC) 20 MG capsule Take 20 mg by mouth daily.      . polyethylene glycol powder (GLYCOLAX/MIRALAX) powder Take 1 Container by mouth as needed for moderate constipation.     Haig Prophet COMFORT INSULIN SYRINGE 31G X 5/16" 0.3 ML MISC USE AS DIRECTED 2-3 TIMES A DAY. 100 each 0   No current facility-administered medications on file prior to visit.   Allergies  Allergen Reactions  . Latex     Itching and breaking out  . Pravastatin     Pt stated, "Made my muscle ache"    Recent Results (from the past 2160 hour(s))  POCT HgB A1C     Status: Abnormal   Collection Time: 03/30/20 10:14 AM  Result Value Ref Range   Hemoglobin A1C 8.2 (A) 4.0 - 5.6 %   HbA1c POC (<> result, manual entry)  HbA1c, POC (prediabetic range)     HbA1c, POC (controlled diabetic range)    Fructosamine     Status: Abnormal   Collection Time: 03/30/20 10:49 AM  Result Value Ref Range   Fructosamine 345 (H) 0 - 285 umol/L    Comment: Published reference interval for apparently healthy subjects between age 51 and 69 is 18 - 285 umol/L and in a poorly controlled diabetic population is 228 - 563 umol/L with a mean of 396 umol/L.   Comprehensive metabolic panel     Status: Abnormal   Collection Time: 03/30/20 10:49 AM  Result Value Ref Range   Sodium 136 135 - 145 mEq/L   Potassium 4.7 3.5 - 5.1 mEq/L   Chloride 100 96 - 112 mEq/L   CO2 29 19 - 32 mEq/L    Glucose, Bld 165 (H) 70 - 99 mg/dL   BUN 27 (H) 6 - 23 mg/dL   Creatinine, Ser 1.04 0.40 - 1.20 mg/dL   Total Bilirubin 0.4 0.2 - 1.2 mg/dL   Alkaline Phosphatase 83 39 - 117 U/L   AST 13 0 - 37 U/L   ALT 11 0 - 35 U/L   Total Protein 7.5 6.0 - 8.3 g/dL   Albumin 4.1 3.5 - 5.2 g/dL   GFR 62.87 >60.00 mL/min   Calcium 10.1 8.4 - 10.5 mg/dL    Objective: General: Patient is awake, alert, and oriented x 3 and in no acute distress.  Integument: Skin is warm, dry and supple bilateral. Nails are tender, long, thickened and dystrophic with subungual debris, consistent with onychomycosis, 1-5 bilateral with small fragments of nails from previous removal. No signs of infection.+ callus styloid left foot plantar aspect. Remaining integument unremarkable.  Neurovascular status: Unchanged from prior.   Musculoskeletal: Asymptomatic hammertoe and pes planus pedal deformities noted bilateral. + Charcot on left.  Muscular strength 4/5 on left and 5/5 on right in all lower extremity muscular groups bilateral without pain on range of motion . No tenderness with calf compression bilateral.  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 -Re-Discussed and educated patient on diabetic foot care, especially with  regards to the vascular, neurological and musculoskeletal systems.  -Mechanically debrided all nails 1-5 bilateral using sterile nail nipper and filed with dremel without incident  -Mechanically debrided callus using sterile chisel blade without incident at no additional charge at plantar left foot  -Diabetic shoes were dispensed by Liliane Channel -Answered all patient questions -Patient to return  in 3 months for at risk  foot care and for shoes when scheduled  -Patient advised to call the office if any problems or questions arise in the meantime.  Landis Martins, DPM

## 2020-05-20 ENCOUNTER — Other Ambulatory Visit: Payer: Self-pay | Admitting: Endocrinology

## 2020-06-03 ENCOUNTER — Other Ambulatory Visit: Payer: Self-pay | Admitting: Endocrinology

## 2020-06-12 ENCOUNTER — Ambulatory Visit: Payer: Medicare Other | Admitting: Family Medicine

## 2020-06-15 NOTE — Progress Notes (Signed)
Cardiology Office Note  Date: 06/17/2020   ID: Brittney [name], DOB [birth year], MRN [id number]  PCP:  Iona Beard, MD  Cardiologist:  No primary care provider on file. Electrophysiologist:  None   Chief Complaint: Paroxysmal atrial fibrillation.  History of Present Illness: Brittney [name] is a 74 y.o. female with a history of paroxysmal atrial fibrillation, HTN, HLD.  Last seen by Dr. Bronson Ing 02/20/2018 for follow-up for history of palpitations and paroxysmal atrial fibrillation.  Very remote history of PAF.  Never on anticoagulation.  She denied any chest pain, palpitations, shortness of breath, lightheadedness, dizziness, lower extremity edema, PND, orthopnea or syncope.  No arrhythmias by event monitoring.  Normal cardiac function by echo.  She was to continue metoprolol.  Blood pressure was controlled and were no changes in therapy.  Lipid panel on 12/27/2017 demonstrated total cholesterol 165, triglycerides 65, HDL 60, LDL 92.  Continue on pravastatin 40 mg daily.  She presents today with recent complaints increased episodes of palpitations.  She states the palpitations do not happen that often and are brief when they do occur.  She admits to a fair amount of caffeine intake with coffee and other stimulants.  She denies any other associated symptoms when the palpitations occur.  She states that he may last for seconds and go away.  Admits to the palpitations occurring approximately once every 2 to 3 weeks.  She denies any history of sleep apnea.  Her daughter who is with her admits she does snore and has periods of apnea/hypoapnea.  Patient states she sometimes has daytime sleepiness but she has erratic sleep patterns.  She states usually her routine is to initially go to sleep for several hours.  She gets up usually in the middle of the night for 2 to 3 hours then returns to sleep for several hours more.  She has never had any formal sleep studies.  She denies any other issues such as  exertional chest pain, or dyspnea on exertion.  Denies any orthostatic symptoms, CVA or TIA-like symptoms, PND, orthopnea, lower extremity edema other than occasional mild left ankle swelling.  She states she wears diabetic shoes.  Her blood pressure is elevated today on arrival due to not taking her antihypertensive before arriving.  She states she does not use alprazolam for sleep aid because it caused hallucinations.   Past Medical History:  Diagnosis Date  . Atrial fibrillation (HCC)    Paroxysmal; LVH; nl EF; onset in 1999  . Burning with urination 02/23/2016  . Diabetes mellitus    insulin; managed by Dr. Dwyane Dee  . 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 Hyperlipidemia   . Hypertension   . Hypothyroidism    history of goiter; nl TSH off medication  . Itching with irritation 02/27/2015  . Obesity 07/06/2012  . Palpitations    negative event recorder in 2009  . UTI (urinary tract infection) 02/23/2016    Past Surgical History:  Procedure Laterality Date  . BIOPSY THYROID    . CESAREAN SECTION    . CHOLECYSTECTOMY  11/95  . COLONOSCOPY  2007  . RETINAL DETACHMENT SURGERY  2009  . TUBAL LIGATION  1980's    Current Outpatient Medications  Medication Sig Dispense Refill  . ACCU-CHEK AVIVA PLUS test strip USE TO TEST TWICE DAILY. 100 each 5  . ALPRAZolam (XANAX) 0.5 MG tablet Take 0.5 mg by mouth at bedtime. Takes half a tab at bedtime    .  B-D ULTRAFINE III SHORT PEN 31G X 8 MM MISC USE AS DIRECTED 2-3 TIMES A DAY. 100 each 11  . Blood Glucose Monitoring Suppl (ACCU-CHEK AVIVA PLUS) W/DEVICE KIT Use to check blood sugar 2 times per day dx code E11.65 1 kit 0  . cloNIDine (CATAPRES) 0.1 MG tablet TAKE (1) TABLET BY MOUTH TWICE DAILY. 60 tablet 0  . exenatide (BYETTA) 10 MCG/0.04ML SOPN injection Inject 0.04 mLs (10 mcg total) into the skin 2 (two) times daily with a meal. Inject 30 minutes before eating 1 pen 3  . gabapentin (NEURONTIN) 100 MG capsule Take 3  capsules (300 mg total) by mouth at bedtime. 90 capsule 0  . HUMALOG KWIKPEN 100 UNIT/ML KwikPen INJECT 6 TO 7 UNITS SUBCUTANEOUSLY BEFORE SUPPER IF GLUCOSE IS HIGH. 15 mL 0  . hydrochlorothiazide (MICROZIDE) 12.5 MG capsule TAKE ONE CAPSULE BY MOUTH ONCE DAILY. 30 capsule 0  . Lancets (ONETOUCH ULTRASOFT) lancets     . LANTUS SOLOSTAR 100 UNIT/ML Solostar Pen INJECT 44 UNITS SUBCUTANEOUSLY ONCE EVERY MORNING. 15 mL 2  . metFORMIN (GLUCOPHAGE) 850 MG tablet TAKE (1) TABLET BY MOUTH TWICE DAILY. 60 tablet 0  . metoprolol tartrate (LOPRESSOR) 50 MG tablet TAKE 1/2 TABLET BY MOUTH TWICE DAILY. 30 tablet 6  . Multiple Vitamin (MULTIVITAMIN) tablet Take 1 tablet by mouth once a week.     Marland Kitchen omeprazole (PRILOSEC) 20 MG capsule Take 20 mg by mouth daily.      . polyethylene glycol powder (GLYCOLAX/MIRALAX) powder Take 1 Container by mouth as needed for moderate constipation.     . pravastatin (PRAVACHOL) 20 MG tablet TAKE ONE TABLET BY MOUTH DAILY. 90 tablet 0  . SURE COMFORT INSULIN SYRINGE 31G X 5/16" 0.3 ML MISC USE AS DIRECTED 2-3 TIMES A DAY. 100 each 0   No current facility-administered medications for this visit.   Allergies:  Latex and Pravastatin   Social History: The patient  reports that she quit smoking about 33 years ago. Her smoking use included cigarettes. She started smoking about 53 years ago. She smoked 1.00 pack per day. She has never used smokeless tobacco. She reports that she does not drink alcohol and does not use drugs.   Family History: The patient's family history includes Cancer in her father; Diabetes in her daughter and mother; Other in her son; Thyroid disease in her brother.   ROS:  Please see the history of present illness. Otherwise, complete review of systems is positive for none.  All other systems are reviewed and negative.   Physical Exam: VS:  BP (!) 176/81   Pulse 80   Ht _0  (1.702 m)   Wt (!) 223 lb (101.2 kg)   SpO2 97%   BMI 34.93 kg/m , BMI Body  mass index is 34.93 kg/m.  Wt Readings from Last 3 Encounters:  06/16/20 (!) 223 lb (101.2 kg)  03/30/20 224 lb 9.6 oz (101.9 kg)  01/27/20 227 lb 6.4 oz (103.1 kg)    General: Patient appears comfortable at rest. HEENT: Conjunctiva and lids normal, oropharynx clear with moist mucosa. Neck: Supple, no elevated JVP or carotid bruits, no thyromegaly. Lungs: Clear to auscultation, nonlabored breathing at rest. Cardiac: Regular rate and rhythm, no S3 or significant systolic murmur, no pericardial rub. Abdomen: Soft, nontender, no hepatomegaly, bowel sounds present, no guarding or rebound. Extremities: No pitting edema, distal pulses 2+. Skin: Warm and dry. Musculoskeletal: No kyphosis. Neuropsychiatric: Alert and oriented x3, affect grossly appropriate.  ECG:  An ECG  dated 06/16/2020 was personally reviewed today and demonstrated:  Normal sinus rhythm rate of 80, minimal voltage criteria for LVH.  May be normal variant.  Recent Labwork: 12/30/2019: TSH <0.01 03/30/2020: ALT 11; AST 13; BUN 27; Creatinine, Ser 1.04; Potassium 4.7; Sodium 136     Component Value Date/Time   CHOL 191 12/30/2019 1039   TRIG 148.0 12/30/2019 1039   HDL 62.10 12/30/2019 1039   CHOLHDL 3 12/30/2019 1039   VLDL 29.6 12/30/2019 1039   LDLCALC 99 12/30/2019 1039    Other Studies Reviewed Today:   Echocardiogram 05/05/2016 Study Conclusions   - Left ventricle: The cavity size was normal. Systolic function was  normal. The estimated ejection fraction was in the range of 60%  to 65%. Wall motion was normal; there were no regional wall  motion abnormalities. Left ventricular diastolic function  parameters were normal.  - Atrial septum: No defect or patent foramen ovale was identified.    Non telemetry monitoring 05/05/2016   Study Highlights   Sinus rhythm. No arrhythmias. Symptoms correlated with sinus rhythm.  Vascular US ABI 06/26/2019 Summary:  Right: Resting right ankle-brachial index  indicates mild right lower  extremity arterial disease. The right toe-brachial index is normal. RT  great toe pressure = 165 mmHg.   Left: Resting left ankle-brachial index is within normal range. No  evidence of significant left lower extremity arterial disease. The left  toe-brachial index is normal. LT Great toe pressure = 116 mmHg.   Assessment and Plan:  1. Paroxysmal atrial fibrillation (HCC)   2. Palpitations   3. Essential hypertension   4. Mixed hyperlipidemia    1. Palpitations Recently complaining of short-lived brief palpitations occurring once every 2 to 3 weeks which are not bothersome nor symptomatic.Marland Kitchen  She states they just seem to be more frequent than usual.  Advised decreasing stimulant intake including coffees, teas, caffeinated sodas.  Patient states she will try this and see if this decreases the incidence of palpitations.  If this does not relieve the symptoms she may need a sleep study to assess for obstructive sleep apnea possibly contributing to palpitations  2. Essential hypertension Blood pressure is elevated today because she did not take her antihypertensive medications.  She was essentially normotensive at last check on 03/30/2020 when her blood pressure was 136/66.  Continue metoprolol 50 mg 1/2 tablet p.o. twice daily, HCTZ 12.5 mg daily, clonidine 0.1 mg p.o. twice daily.  3. Mixed hyperlipidemia Recent lipid panel on 12/30/2019; TC 200, TG 148, HDL 62, LDL 99.  Continue pravastatin 20 mg daily.  4.  Paroxysmal atrial fibrillation EKG today shows normal sinus rhythm rate of 80.  Minimal voltage criteria for LVH may be normal variant.  Patient complaining of recent increase in palpitations which are brief without associated symptoms.  Once or twice every 3 weeks.  Patient not on anticoagulation.  Continue metoprolol 50 mg 1/2 tablet p.o. twice daily.  CHA2DS2-VASc score 3.  (Female, diabetes, age 29-74)   Medication Adjustments/Labs and Tests Ordered: Current  medicines are reviewed at length with the patient today.  Concerns regarding medicines are outlined above.   Disposition: Follow-up with Dr. Harl Bowie or APP 3 months  Signed, Levell July, NP 06/17/2020 11:27 AM    [facility] [place], [place], [place] [address] Phone: [phone]; Fax: [phone]

## 2020-06-16 ENCOUNTER — Ambulatory Visit (INDEPENDENT_AMBULATORY_CARE_PROVIDER_SITE_OTHER): Payer: Medicare Other | Admitting: Family Medicine

## 2020-06-16 VITALS — BP 176/81 | HR 80 | Ht 67.0 in | Wt 223.0 lb

## 2020-06-16 DIAGNOSIS — E782 Mixed hyperlipidemia: Secondary | ICD-10-CM

## 2020-06-16 DIAGNOSIS — I10 Essential (primary) hypertension: Secondary | ICD-10-CM

## 2020-06-16 DIAGNOSIS — R002 Palpitations: Secondary | ICD-10-CM

## 2020-06-16 DIAGNOSIS — I480 Paroxysmal atrial fibrillation: Secondary | ICD-10-CM | POA: Diagnosis not present

## 2020-06-16 NOTE — Patient Instructions (Addendum)
Medication Instructions:   Your physician recommends that you continue on your current medications as directed. Please refer to the Current Medication list given to you today.  Labwork:  NONE  Testing/Procedures:  NONE  Follow-Up:  Your physician recommends that you schedule a follow-up appointment in: 3 months.  Any Other Special Instructions Will Be Listed Below (If Applicable).  If you need a refill on your cardiac medications before your next appointment, please call your pharmacy. 

## 2020-06-17 ENCOUNTER — Encounter: Payer: Self-pay | Admitting: Family Medicine

## 2020-06-30 ENCOUNTER — Ambulatory Visit: Payer: Medicare Other | Admitting: Endocrinology

## 2020-07-02 ENCOUNTER — Other Ambulatory Visit: Payer: Self-pay | Admitting: Endocrinology

## 2020-07-02 ENCOUNTER — Ambulatory Visit: Payer: Medicare Other | Attending: Internal Medicine

## 2020-07-02 DIAGNOSIS — Z23 Encounter for immunization: Secondary | ICD-10-CM

## 2020-07-02 NOTE — Telephone Encounter (Signed)
Please review refill request and advise

## 2020-07-02 NOTE — Telephone Encounter (Signed)
OK to refill But need to reschedule missed appointment

## 2020-07-02 NOTE — Progress Notes (Signed)
° °  Covid-19 Vaccination Clinic  Name:  ANIA LEVAY    MRN: [id number] DOB: October 25, 1946  07/02/2020  Ms. Ketter was observed post Covid-19 immunization for 15 minutes without incident. She was provided with Vaccine Information Sheet and instruction to access the V-Safe system.   Ms. Lira was instructed to call 911 with any severe reactions post vaccine:  Difficulty breathing   Swelling of face and throat   A fast heartbeat   A bad rash all over body   Dizziness and weakness   Immunizations Administered    Name Date Dose VIS Date Route   Moderna COVID-19 Vaccine 07/02/2020 12:02 PM 0.5 mL 10/2019 Intramuscular   Manufacturer: Moderna   Lot: [id number]   [place]: [phone]

## 2020-07-02 NOTE — Telephone Encounter (Signed)
Per Dr. Ronnie Derby orders, Rx sent for 30 day supply. Must schedule appt for future refills to be authorized.  Outpatient Medication Detail   Disp Refills Start End   gabapentin (NEURONTIN) 100 MG capsule 90 capsule 0 07/02/2020    Sig - Route: Take 3 capsules (300 mg total) by mouth at bedtime. - Oral   Sent to pharmacy as: gabapentin (NEURONTIN) 100 MG capsule   Notes to Pharmacy: WILL ONLY PROVIDE 30 DAY SUPPLY. MUST CALL OFFICE TO SCHEDULE APPT   E-Prescribing Status: Receipt confirmed by pharmacy (07/02/2020 4:30 PM EDT)

## 2020-07-13 DIAGNOSIS — E785 Hyperlipidemia, unspecified: Secondary | ICD-10-CM | POA: Diagnosis not present

## 2020-07-13 DIAGNOSIS — E1122 Type 2 diabetes mellitus with diabetic chronic kidney disease: Secondary | ICD-10-CM | POA: Diagnosis not present

## 2020-07-13 DIAGNOSIS — I10 Essential (primary) hypertension: Secondary | ICD-10-CM | POA: Diagnosis not present

## 2020-07-21 DIAGNOSIS — E785 Hyperlipidemia, unspecified: Secondary | ICD-10-CM | POA: Diagnosis not present

## 2020-07-21 DIAGNOSIS — N183 Chronic kidney disease, stage 3 unspecified: Secondary | ICD-10-CM | POA: Diagnosis not present

## 2020-07-21 DIAGNOSIS — E1122 Type 2 diabetes mellitus with diabetic chronic kidney disease: Secondary | ICD-10-CM | POA: Diagnosis not present

## 2020-07-21 DIAGNOSIS — I129 Hypertensive chronic kidney disease with stage 1 through stage 4 chronic kidney disease, or unspecified chronic kidney disease: Secondary | ICD-10-CM | POA: Diagnosis not present

## 2020-07-28 ENCOUNTER — Other Ambulatory Visit: Payer: Self-pay | Admitting: Endocrinology

## 2020-07-30 ENCOUNTER — Ambulatory Visit: Payer: Medicare Other | Attending: Internal Medicine

## 2020-07-30 DIAGNOSIS — Z23 Encounter for immunization: Secondary | ICD-10-CM

## 2020-07-30 NOTE — Progress Notes (Signed)
   Covid-19 Vaccination Clinic  Name:  Brittney [name]    MRN: [id number] DOB: 1946/08/18  07/30/2020  [name] was observed post Covid-19 immunization for 30 minutes based on pre-vaccination screening without incident. She was provided with Vaccine Information Sheet and instruction to access the V-Safe system.   [name] was instructed to call 911 with any severe reactions post vaccine: Marland Kitchen Difficulty breathing  . Swelling of face and throat  . A fast heartbeat  . A bad rash all over body  . Dizziness and weakness   Immunizations Administered    Name Date Dose VIS Date Route   Moderna COVID-19 Vaccine 07/30/2020  9:19 AM 0.5 mL 10/2019 Intramuscular   Manufacturer: Moderna   Lot: [id number]   [place]: [id number]

## 2020-08-03 ENCOUNTER — Other Ambulatory Visit: Payer: Self-pay

## 2020-08-03 ENCOUNTER — Encounter: Payer: Self-pay | Admitting: Endocrinology

## 2020-08-03 ENCOUNTER — Ambulatory Visit (INDEPENDENT_AMBULATORY_CARE_PROVIDER_SITE_OTHER): Payer: Medicare Other | Admitting: Endocrinology

## 2020-08-03 ENCOUNTER — Other Ambulatory Visit (INDEPENDENT_AMBULATORY_CARE_PROVIDER_SITE_OTHER): Payer: Medicare Other

## 2020-08-03 VITALS — BP 144/68 | HR 75 | Ht 67.0 in | Wt 230.0 lb

## 2020-08-03 DIAGNOSIS — E1165 Type 2 diabetes mellitus with hyperglycemia: Secondary | ICD-10-CM | POA: Diagnosis not present

## 2020-08-03 DIAGNOSIS — E1142 Type 2 diabetes mellitus with diabetic polyneuropathy: Secondary | ICD-10-CM | POA: Diagnosis not present

## 2020-08-03 DIAGNOSIS — E0590 Thyrotoxicosis, unspecified without thyrotoxic crisis or storm: Secondary | ICD-10-CM

## 2020-08-03 LAB — COMPREHENSIVE METABOLIC PANEL
ALT: 8 U/L (ref 0–35)
AST: 11 U/L (ref 0–37)
Albumin: 4.2 g/dL (ref 3.5–5.2)
Alkaline Phosphatase: 87 U/L (ref 39–117)
BUN: 19 mg/dL (ref 6–23)
CO2: 27 mEq/L (ref 19–32)
Calcium: 9.8 mg/dL (ref 8.4–10.5)
Chloride: 104 mEq/L (ref 96–112)
Creatinine, Ser: 1.03 mg/dL (ref 0.40–1.20)
GFR: 63.52 mL/min (ref >60.00)
Glucose, Bld: 117 mg/dL — ABNORMAL HIGH (ref 70–99)
Potassium: 4.6 mEq/L (ref 3.5–5.1)
Sodium: 139 mEq/L (ref 135–145)
Total Bilirubin: 0.4 mg/dL (ref 0.2–1.2)
Total Protein: 7.5 g/dL (ref 6.0–8.3)

## 2020-08-03 LAB — URINALYSIS, ROUTINE W REFLEX MICROSCOPIC
Bilirubin Urine: NEGATIVE
Hgb urine dipstick: NEGATIVE
Ketones, ur: NEGATIVE
Leukocytes,Ua: NEGATIVE
Nitrite: NEGATIVE
RBC / HPF: NONE SEEN (ref 0–?)
Specific Gravity, Urine: 1.02 (ref 1.000–1.030)
Total Protein, Urine: NEGATIVE
Urine Glucose: NEGATIVE
Urobilinogen, UA: 0.2 (ref 0.0–1.0)
pH: 5.5 (ref 5.0–8.0)

## 2020-08-03 LAB — T4, FREE: Free T4: 1.03 ng/dL (ref 0.60–1.60)

## 2020-08-03 LAB — POCT GLYCOSYLATED HEMOGLOBIN (HGB A1C): Hemoglobin A1C: 7.6 % — AB (ref 4.0–5.6)

## 2020-08-03 LAB — TSH: TSH: <0.01 u[IU]/mL — ABNORMAL LOW (ref 0.35–4.50)

## 2020-08-03 LAB — T3, FREE: T3, Free: 3.9 pg/mL (ref 2.3–4.2)

## 2020-08-03 LAB — MICROALBUMIN / CREATININE URINE RATIO
Creatinine,U: 137.8 mg/dL
Microalb Creat Ratio: 1.1 mg/g (ref 0.0–30.0)
Microalb, Ur: 1.6 mg/dL (ref 0.0–1.9)

## 2020-08-03 MED ORDER — EXENATIDE 10 MCG/0.04ML ~~LOC~~ SOPN
10.0000 ug | PEN_INJECTOR | Freq: Two times a day (BID) | SUBCUTANEOUS | 2 refills | Status: DC
Start: 2020-08-03 — End: 2020-11-12

## 2020-08-03 NOTE — Progress Notes (Signed)
Patient ID: Brittney [name], female   DOB: 08/02/1946, 74 y.o.   MRN: [id number]     Reason for Appointment: Followup of hypertension and diabetes  History of Present Illness    Type 2 DIABETES MELITUS, date of diagnosis: 1976      Brittney [name] had long-standing diabetes taking insulin. Because of Brittney [name] difficulties with compliance using mealtime insulin Brittney [name] was given in Byetta in 2011 and subsequently Victoza in addition to Brittney [name] Lantus and this helped Brittney [name] control However Brittney [name] tends to be erratic with Brittney [name] day-to-day care and [name] not had adequate A1c levels in the past, usually over 7%  RECENT history:   Insulin regimen: Lantus 44 units daily in a.m. Humalog 5 units before dinner  Non-insulin hypoglycemic drugs: taking Byetta 0 mcg before meals,   Metformin  850 twice a day   Brittney [name] A1c [name] gone down to 7.6 from 8.1 Last visit was in 5/21  Current glucose patterns and problems:  Brittney [name] appears to be having relatively low blood sugars fasting including, some hypoglycemia overnight  Brittney [name] difficulty remembering to take Humalog and does not understand to take this before eating to keep Brittney [name] blood sugars are rising when Brittney [name] eating any meals with carbohydrate especially at dinnertime  Brittney [name] will only take Humalog when Brittney [name] blood sugar goes up significantly high  With this Brittney [name] had at least one low blood sugar in the morning from taking Brittney [name] correction dose late at night  Also not clear why Brittney [name] morning readings are lower without any change in Lantus  Brittney [name] stopped taking Byetta reportedly about 3 weeks ago because Brittney [name] thought it was causing swelling in Brittney [name] neck  Also previously had taking it only in the evenings  Also Brittney [name] weight [name] gone up 7 pounds after stopping this  Highest blood sugar [name] 299 around 1 AM  Brittney [name] only able to walk a little because of foot pain and Charcot foot    Side effects from medications:  Invokana: ? Headache.  Victoza ?  Swelling in neck      Monitors  blood glucose: Up to 2-3 times a day     Glucometer: Accucheck         Blood Glucose from download   PRE-MEAL Fasting Lunch Dinner  overnight Overall  Glucose range:  52-208 ?     Mean/median:  102    163  128   POST-MEAL PC Breakfast PC Lunch PC Dinner  Glucose range:    126-268  Mean/median:    185   Previous readings:  PRE-MEAL Fasting Lunch  evening Bedtime Overall  Glucose range: 83-187  85-285    Mean/median: 132   194  145       Meals: 2-3 meals per day. . Dinner 6-7 pm, variable quantity and carbohydrates  Does have protein with breakfast consistently, [name] egg for protein   Wt Readings from Last 3 Encounters:  08/03/20 230 lb (104.3 kg)  06/16/20 (!) 223 lb (101.2 kg)  03/30/20 224 lb 9.6 oz (101.9 kg)   GLYCEMIC CONTROL:  Lab Results  Component Value Date   HGBA1C 8.2 (A) 03/30/2020   HGBA1C 8.1 (A) 12/30/2019   HGBA1C 8.6 (A) 10/01/2019   Lab Results  Component Value Date   MICROALBUR <0.7 10/01/2019   LDLCALC 99 12/30/2019   CREATININE 1.04 03/30/2020   Lab Results  Component Value Date   FRUCTOSAMINE 345 (H) 03/30/2020   FRUCTOSAMINE 342 (H) 09/08/2017  Other problems discussed today: See review of systems   Allergies as of 08/03/2020      Reactions   Latex    Itching and breaking out   Pravastatin    Pt stated, "Made my muscle ache"      Medication List       Accurate as of August 03, 2020 11:12 AM. If you have any questions, ask your nurse or doctor.        Accu-Chek Aviva Plus test strip Generic drug: glucose blood USE TO TEST TWICE DAILY.   Accu-Chek Aviva Plus w/Device Kit Use to check blood sugar 2 times per day dx code E11.65   ALPRAZolam 0.5 MG tablet Commonly known as: XANAX Take 0.5 mg by mouth at bedtime. Takes half a tab at bedtime   B-D ULTRAFINE III SHORT PEN 31G X 8 MM Misc Generic drug: Insulin Pen Needle USE AS DIRECTED 2-3 TIMES A DAY.   cloNIDine 0.1 MG tablet Commonly known as: CATAPRES TAKE  (1) TABLET BY MOUTH TWICE DAILY.   exenatide 10 MCG/0.04ML Sopn injection Commonly known as: BYETTA Inject 0.04 mLs (10 mcg total) into the skin 2 (two) times daily with a meal. Inject 30 minutes before eating   gabapentin 100 MG capsule Commonly known as: NEURONTIN Take 3 capsules (300 mg total) by mouth at bedtime.   HumaLOG KwikPen 100 UNIT/ML KwikPen Generic drug: insulin lispro INJECT 6 TO 7 UNITS SUBCUTANEOUSLY BEFORE SUPPER IF GLUCOSE [name] HIGH.   hydrochlorothiazide 12.5 MG capsule Commonly known as: MICROZIDE TAKE ONE CAPSULE BY MOUTH ONCE DAILY.   Lantus SoloStar 100 UNIT/ML Solostar Pen Generic drug: insulin glargine INJECT 44 UNITS SUBCUTANEOUSLY ONCE EVERY MORNING.   metFORMIN 850 MG tablet Commonly known as: GLUCOPHAGE TAKE (1) TABLET BY MOUTH TWICE DAILY.   metoprolol tartrate 50 MG tablet Commonly known as: LOPRESSOR TAKE 1/2 TABLET BY MOUTH TWICE DAILY.   multivitamin tablet Take 1 tablet by mouth once a week.   omeprazole 20 MG capsule Commonly known as: PRILOSEC Take 20 mg by mouth daily.   onetouch ultrasoft lancets   polyethylene glycol powder 17 GM/SCOOP powder Commonly known as: GLYCOLAX/MIRALAX Take 1 Container by mouth as needed for moderate constipation.   pravastatin 20 MG tablet Commonly known as: PRAVACHOL TAKE ONE TABLET BY MOUTH DAILY.   Sure Comfort Insulin Syringe 31G X 5/16" 0.3 ML Misc Generic drug: Insulin Syringe-Needle U-100 USE AS DIRECTED 2-3 TIMES A DAY.       Allergies:  Allergies  Allergen Reactions   Latex     Itching and breaking out   Pravastatin     Pt stated, "Made my muscle ache"    Past Medical History:  Diagnosis Date   Atrial fibrillation (HCC)    Paroxysmal; LVH; nl EF; onset in 1999   Burning with urination 02/23/2016   Diabetes mellitus    insulin; managed by Dr. 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[facility])    left lower extremity   Hyperlipidemia    Hypertension    Hypothyroidism      history of goiter; nl TSH off medication   Itching with irritation 02/27/2015   Obesity 07/06/2012   Palpitations    negative event recorder in 2009   UTI (urinary tract infection) 02/23/2016    Past Surgical History:  Procedure Laterality Date   BIOPSY THYROID     CESAREAN SECTION     CHOLECYSTECTOMY  11/95   COLONOSCOPY  2007   RETINAL DETACHMENT SURGERY  2009  TUBAL LIGATION  63's    Family History  Problem Relation Age of Onset   Diabetes Mother    Diabetes Daughter        gestational diabetes   Cancer Father    Thyroid disease Brother    Other Son        MVA    Social History:  reports that Brittney [name] quit smoking about 33 years ago. Brittney [name] smoking use included cigarettes. Brittney [name] started smoking about 53 years ago. Brittney [name] smoked 1.00 pack per day. Brittney [name] never used smokeless tobacco. Brittney [name] reports that Brittney [name] does not drink alcohol and does not use drugs.  Review of Systems:   HYPERTENSION:  Previously followed by PCP, had been on Norvasc, MAXZIDE and Benicar  Because of high potassium and increased creatinine Brittney [name] Benicar and Maxzide had been stopped Brittney [name] on the clonidine 0.1 mg bid along with HCTZ 12.5 mg daily, also on 50 mg Toprol Previously on the clonidine patch Brittney [name] had some itching at the site   Bp at home averaging 829 systolic recently  BP Readings from Last 3 Encounters:  08/03/20 (!) 144/68  06/16/20 (!) 176/81  03/30/20 136/66     HYPERLIPIDEMIA: The lipid abnormality consists of elevated LDL; Brittney [name] does not take Brittney [name] pravastatin daily because of symptoms of muscle aches and cramping   Lab Results  Component Value Date   CHOL 191 12/30/2019   HDL 62.10 12/30/2019   LDLCALC 99 12/30/2019   TRIG 148.0 12/30/2019   CHOLHDL 3 12/30/2019    MULTINODULAR GOITER: Brittney [name] had a long-standing multinodular goiter since at least 2005 which [name] been fairly stable clinically Brittney [name] does have history of occasional palpitations for years Metoprolol [name] been  prescribed by cardiologist  Brittney [name] no local pressure symptoms in the neck which Brittney [name] thinks Brittney [name] goiter may be swelling more No difficulty swallowing  Brittney [name] goiter [name] been autonomous with no evidence of overt hyperthyroidism  with normal free T4 and free T3 levels; TSH [name] been consistently suppressed   Brittney [name] I-131 uptake in 2003 was 17% and Brittney [name]  thyroid ultrasound in 2014 did not show distinct nodules but a relative increase in size  Lab Results  Component Value Date   TSH <0.01 (L) 12/30/2019   TSH <0.01 (L) 10/01/2019   FREET4 1.16 12/30/2019   FREET4 1.19 10/01/2019   Lab Results  Component Value Date   T3FREE 3.4 12/30/2019   T3FREE 3.7 10/01/2019   T3FREE 3.1 05/28/2019       Foot exam done 12/30/2019 Brittney [name] some numbness in Brittney [name] toes which [name] mild and chronic  This [name] better with gabapentin but Brittney [name] does get pain when Brittney [name] trying to walk Brittney [name] started using diabetic shoes    Examination:   BP (!) 144/68    Pulse 75    Ht 5' 7"  (1.702 m)    Wt 230 lb (104.3 kg)    SpO2 98%    BMI 36.02 kg/m   Body mass index [name] 36.02 kg/m.   Brittney [name] a significantly large bilateral goiter   ASSESSMENT/ PLAN:  Diabetes Type 2 with obesity on insulin, Byetta and metformin  See history of present illness for detailed discussion of  current management, blood sugar patterns and problems identified  Brittney [name] A1c [name] 7.6  A1c [name] likely lower because of Brittney [name] morning sugars coming down and not clear why This [name] despite Brittney [name] weight gain Brittney [name] also [name] occasional hypoglycemic episodes in the morning However still  [name] readings over 200 regularly after meals but not taking Byetta or Humalog as prescribed [name] gained weight also Discussed the importance of diet and how it works as well as benefits for Brittney [name] blood sugar patterns compared to Trulicity  Recommendations:  Brittney [name] to take Humalog with any dinner meal that [name] more than 1 small serving of carbohydrate and again discussed what starchy foods  are  [name] to restart Byetta to 10 mcg, Brittney [name] can take this 30 minutes before eating Brittney [name] will take 40 units of Lantus only  We will recheck renal function since Brittney [name] on Metformin  HYPERTENSION: Blood pressure [name] better controlled  Multinodular goiter: Brittney [name] noticing some increased swelling but no local pressure symptoms Need to recheck thyroid functions to rule out progression to hyperthyroidism with potential indication of I-131 treatment   To have labs checked today     There are no Patient Instructions on file for this visit.    Brittney [name] 08/03/2020, 11:12 AM    I

## 2020-08-03 NOTE — Patient Instructions (Signed)
Byetta 30 min before Bf and dinner  Humalog for starchy meals and take before the meal  Lantus 40  Check blood sugars on waking up 5 days a week  Also check blood sugars about 2 hours after meals and do this after different meals by rotation  Recommended blood sugar levels on waking up are 90-130 and about 2 hours after meal is 130-160  Please bring your blood sugar monitor to each visit, thank you

## 2020-08-15 ENCOUNTER — Other Ambulatory Visit: Payer: Self-pay | Admitting: Endocrinology

## 2020-08-24 ENCOUNTER — Other Ambulatory Visit: Payer: Self-pay | Admitting: Endocrinology

## 2020-09-02 ENCOUNTER — Other Ambulatory Visit (HOSPITAL_COMMUNITY): Payer: Self-pay | Admitting: Family Medicine

## 2020-09-02 ENCOUNTER — Other Ambulatory Visit: Payer: Self-pay | Admitting: Endocrinology

## 2020-09-02 DIAGNOSIS — Z1231 Encounter for screening mammogram for malignant neoplasm of breast: Secondary | ICD-10-CM

## 2020-09-14 ENCOUNTER — Telehealth: Payer: Self-pay | Admitting: Sports Medicine

## 2020-09-14 ENCOUNTER — Ambulatory Visit (HOSPITAL_COMMUNITY): Payer: Medicare Other

## 2020-09-14 NOTE — Telephone Encounter (Signed)
Pt called stating she left message this morning about what to do about a blister on her toe. She is diabetic. She normally see's Dr Cannon Kettle.   I put her on schedule to see Dr Sherryle Lis tomorrow and she is checking with daughter to make sure she can bring her.

## 2020-09-14 NOTE — Telephone Encounter (Signed)
Ok thanks for getting her in sooner re: blister  -Dr Chauncey Cruel

## 2020-09-15 ENCOUNTER — Other Ambulatory Visit: Payer: Self-pay | Admitting: Podiatry

## 2020-09-15 ENCOUNTER — Ambulatory Visit (INDEPENDENT_AMBULATORY_CARE_PROVIDER_SITE_OTHER): Payer: Medicare Other | Admitting: Podiatry

## 2020-09-15 ENCOUNTER — Other Ambulatory Visit: Payer: Self-pay

## 2020-09-15 DIAGNOSIS — S90822A Blister (nonthermal), left foot, initial encounter: Secondary | ICD-10-CM

## 2020-09-15 DIAGNOSIS — E1165 Type 2 diabetes mellitus with hyperglycemia: Secondary | ICD-10-CM | POA: Diagnosis not present

## 2020-09-15 DIAGNOSIS — L97521 Non-pressure chronic ulcer of other part of left foot limited to breakdown of skin: Secondary | ICD-10-CM | POA: Diagnosis not present

## 2020-09-15 DIAGNOSIS — E1142 Type 2 diabetes mellitus with diabetic polyneuropathy: Secondary | ICD-10-CM | POA: Diagnosis not present

## 2020-09-16 ENCOUNTER — Encounter: Payer: Self-pay | Admitting: Cardiology

## 2020-09-16 ENCOUNTER — Ambulatory Visit (INDEPENDENT_AMBULATORY_CARE_PROVIDER_SITE_OTHER): Payer: Medicare Other | Admitting: Cardiology

## 2020-09-16 VITALS — BP 142/80 | HR 74 | Ht 67.0 in | Wt 228.0 lb

## 2020-09-16 DIAGNOSIS — I480 Paroxysmal atrial fibrillation: Secondary | ICD-10-CM

## 2020-09-16 DIAGNOSIS — E782 Mixed hyperlipidemia: Secondary | ICD-10-CM | POA: Diagnosis not present

## 2020-09-16 DIAGNOSIS — I10 Essential (primary) hypertension: Secondary | ICD-10-CM

## 2020-09-16 MED ORDER — PRAVASTATIN SODIUM 20 MG PO TABS
20.0000 mg | ORAL_TABLET | Freq: Every day | ORAL | Status: DC
Start: 2020-09-16 — End: 2021-03-30

## 2020-09-16 NOTE — Progress Notes (Signed)
Clinical Summary Brittney [name] is a 74 y.o.female seen today for follow up of the following medical problems.   1. PAF - from prior notes remote history, has not been committed to anticoag - last visit with PA Leonides Sake was having some palpitations. EKG showed NSR, was to wean caffeine and monitor symtpoms - infrequent palpitations, 2-3 times a month. Lasts just a few seconds.      2.HTN - compliant with meds but just took this AM  3. Hyperlipidemia 12/2019 TC 191 TG 148 HDL 62 LDL 99 - taking pravastatin 48m twice a week, still has leg cramps - her statin history is unclear.    Has had covid vaccine, moderna x 2 just recently.  Past Medical History:  Diagnosis Date  . Atrial fibrillation (HCC)    Paroxysmal; LVH; nl EF; onset in 1999  . Burning with urination 02/23/2016  . Diabetes mellitus    insulin; managed by Dr. KDwyane Dee . 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 Hyperlipidemia   . Hypertension   . Hypothyroidism    history of goiter; nl TSH off medication  . Itching with irritation 02/27/2015  . Obesity 07/06/2012  . Palpitations    negative event recorder in 2009  . UTI (urinary tract infection) 02/23/2016     Allergies  Allergen Reactions  . Latex     Itching and breaking out  . Pravastatin     Pt stated, "Made my muscle ache"     Current Outpatient Medications  Medication Sig Dispense Refill  . ACCU-CHEK AVIVA PLUS test strip USE TO TEST TWICE DAILY. 100 each 5  . ALPRAZolam (XANAX) 0.5 MG tablet Take 0.5 mg by mouth at bedtime. Takes half a tab at bedtime    . B-D ULTRAFINE III SHORT PEN 31G X 8 MM MISC USE AS DIRECTED 2-3 TIMES A DAY. 100 each 11  . Blood Glucose Monitoring Suppl (ACCU-CHEK AVIVA PLUS) W/DEVICE KIT Use to check blood sugar 2 times per day dx code E11.65 1 kit 0  . cloNIDine (CATAPRES) 0.1 MG tablet TAKE (1) TABLET BY MOUTH TWICE DAILY. 180 tablet 1  . exenatide (BYETTA) 10 MCG/0.04ML SOPN injection Inject 10 mcg  into the skin 2 (two) times daily with a meal. Inject 30 minutes before eating 2.4 mL 2  . gabapentin (NEURONTIN) 100 MG capsule Take 3 capsules (300 mg total) by mouth at bedtime. 90 capsule 0  . HUMALOG KWIKPEN 100 UNIT/ML KwikPen INJECT 6 TO 7 UNITS SUBCUTANEOUSLY BEFORE SUPPER IF GLUCOSE IS HIGH. 15 mL 0  . hydrochlorothiazide (MICROZIDE) 12.5 MG capsule TAKE ONE CAPSULE BY MOUTH ONCE DAILY. 30 capsule 0  . Lancets (ONETOUCH ULTRASOFT) lancets     . LANTUS SOLOSTAR 100 UNIT/ML Solostar Pen INJECT 44 UNITS SUBCUTANEOUSLY ONCE EVERY MORNING. 15 mL 0  . metFORMIN (GLUCOPHAGE) 850 MG tablet TAKE (1) TABLET BY MOUTH TWICE DAILY. 60 tablet 0  . metoprolol tartrate (LOPRESSOR) 50 MG tablet TAKE 1/2 TABLET BY MOUTH TWICE DAILY. 30 tablet 6  . Multiple Vitamin (MULTIVITAMIN) tablet Take 1 tablet by mouth once a week.     .Marland Kitchenomeprazole (PRILOSEC) 20 MG capsule Take 20 mg by mouth daily.      . polyethylene glycol powder (GLYCOLAX/MIRALAX) powder Take 1 Container by mouth as needed for moderate constipation.     . pravastatin (PRAVACHOL) 20 MG tablet TAKE ONE TABLET BY MOUTH DAILY. 90 tablet 0  . SURE COMFORT INSULIN SYRINGE  31G X 5/16" 0.3 ML MISC USE AS DIRECTED 2-3 TIMES A DAY. 100 each 0   No current facility-administered medications for this visit.     Past Surgical History:  Procedure Laterality Date  . BIOPSY THYROID    . CESAREAN SECTION    . CHOLECYSTECTOMY  11/95  . COLONOSCOPY  2007  . RETINAL DETACHMENT SURGERY  2009  . TUBAL LIGATION  1980's     Allergies  Allergen Reactions  . Latex     Itching and breaking out  . Pravastatin     Pt stated, "Made my muscle ache"      Family History  Problem Relation Age of Onset  . Diabetes Mother   . Diabetes Daughter        gestational diabetes  . Cancer Father   . Thyroid disease Brother   . Other Son        MVA     Social History Ms. Uptain reports that she quit smoking about 34 years ago. Her smoking use included  cigarettes. She started smoking about 53 years ago. She smoked 1.00 pack per day. She has never used smokeless tobacco. Ms. Fisher reports no history of alcohol use.   Review of Systems CONSTITUTIONAL: No weight loss, fever, chills, weakness or fatigue.  HEENT: Eyes: No visual loss, blurred vision, double vision or yellow sclerae.No hearing loss, sneezing, congestion, runny nose or sore throat.  SKIN: No rash or itching.  CARDIOVASCULAR: per hpi RESPIRATORY: No shortness of breath, cough or sputum.  GASTROINTESTINAL: No anorexia, nausea, vomiting or diarrhea. No abdominal pain or blood.  GENITOURINARY: No burning on urination, no polyuria NEUROLOGICAL: No headache, dizziness, syncope, paralysis, ataxia, numbness or tingling in the extremities. No change in bowel or bladder control.  MUSCULOSKELETAL: No muscle, back pain, joint pain or stiffness.  LYMPHATICS: No enlarged nodes. No history of splenectomy.  PSYCHIATRIC: No history of depression or anxiety.  ENDOCRINOLOGIC: No reports of sweating, cold or heat intolerance. No polyuria or polydipsia.  Marland Kitchen   Physical Examination Today's Vitals   09/16/20 0829  BP: (!) 142/80  Pulse: 74  SpO2: 98%  Weight: 228 lb (103.4 kg)  Height: 5' 7"  (1.702 m)   Body mass index is 35.71 kg/m.  Gen: resting comfortably, no acute distress HEENT: no scleral icterus, pupils equal round and reactive, no palptable cervical adenopathy,  CV: RRR, no m/r/g, no jvd Resp: Clear to auscultation bilaterally GI: abdomen is soft, non-tender, non-distended, normal bowel sounds, no hepatosplenomegaly MSK: extremities are warm, no edema.  Skin: warm, no rash Neuro:  no focal deficits Psych: appropriate affect    Assessment and Plan  1. PAF - very remote history without clear recurrence, has not been committed to anticoag - continue to monitor  2. HTN - elevated today but just took meds prior to arrival, she will call in 2 weeks with home bp's  3.  Hyperlipidemia - only takign pravastatin 73m twice a week, still with leg cramps. Unclear if statin is truly related. Hold pravastatin x 2 weeks and udpate u[place] symptoms.    F/u 6 months  JArnoldo Lenis M.D.

## 2020-09-16 NOTE — Patient Instructions (Addendum)
Medication Instructions:   Hold your Pravastatin x 2 weeks to see if muscle aches are better.   Please call the office in 2 weeks to give [place] an update.  Continue all other medications.    Labwork: none  Testing/Procedures: none  Follow-Up: 6 months   Any Other Special Instructions Will Be Listed Below (If Applicable). Please call with update on blood pressure readings as well in 2 weeks.   If you need a refill on your cardiac medications before your next appointment, please call your pharmacy.

## 2020-09-18 LAB — WOUND CULTURE
MICRO NUMBER:: 11121041
RESULT:: NO GROWTH
SPECIMEN QUALITY:: ADEQUATE

## 2020-09-18 LAB — HOUSE ACCOUNT TRACKING

## 2020-09-20 NOTE — Progress Notes (Signed)
  Subjective:  Patient ID: Brittney [name], female    DOB: [birth year],  MRN: [id number]  Chief Complaint  Patient presents with  . Blister    left foot lateral side blister. PT stated that she is not in pain but it is sore if you touch around the area it has been there for about 2-3 days     74 y.o. female presents with the above complaint. History confirmed with patient. She had an upcoming appt with Dr Cannon Kettle but developed a large blister and came for urgent eval.   Objective:  Physical Exam: warm, good capillary refill, no trophic changes or ulcerative lesions, normal DP and PT pulses and abnormal monofilament exam. Left Foot: hemorrhagic blister with milky bloody fluid in, de-roofing reveals ulceration extending to plantar skin, approximately 1.5cm x 1.0cm x 0.2cm, no malodor or cellulitis, extends to subQ tissue     Assessment:   1. Blister of left foot, initial encounter   2. Ulcer of left foot, limited to breakdown of skin ([place])   3. Diabetic polyneuropathy associated with type 2 diabetes mellitus ([place])   4. Uncontrolled type 2 diabetes mellitus with hyperglycemia ([place])      Plan:  Patient was evaluated and treated and all questions answered.  Patient educated on diabetes. Discussed proper diabetic foot care and discussed risks and complications of disease. Educated patient in depth on reasons to return to the office immediately should he/she discover anything concerning or new on the feet. All questions answered. Discussed proper shoes as well.   Ulcer left foot -Debridement as below. -Dressed with iodosorb, DSD. -Continue off-loading with surgical shoe. -Recommend daily dressing changes -Wound culture taken of fluid  Procedure: Excisional Debridement of Wound Rationale: Removal of non-viable soft tissue from the wound to promote healing.  Anesthesia: none Pre-Debridement Wound Measurements: unmeasureable due to blister Post-Debridement Wound Measurements: 1.5 cm x  1.0 cm x 0.1 cm  Type of Debridement: Sharp Excisional Tissue Removed: Non-viable soft tissue Depth of Debridement: subcutaneous tissue. Technique: Sharp excisional debridement to bleeding, viable wound base.  Dressing: Dry, sterile, compression dressing. Disposition: Patient tolerated procedure well. Patient to return in 1 week for follow-up.  No follow-ups on file.       No follow-ups on file.

## 2020-09-23 ENCOUNTER — Other Ambulatory Visit: Payer: Self-pay | Admitting: Endocrinology

## 2020-09-24 ENCOUNTER — Encounter (INDEPENDENT_AMBULATORY_CARE_PROVIDER_SITE_OTHER): Payer: Medicare Other | Admitting: Ophthalmology

## 2020-09-24 ENCOUNTER — Encounter: Payer: Self-pay | Admitting: Sports Medicine

## 2020-09-24 ENCOUNTER — Other Ambulatory Visit: Payer: Self-pay

## 2020-09-24 ENCOUNTER — Ambulatory Visit (INDEPENDENT_AMBULATORY_CARE_PROVIDER_SITE_OTHER): Payer: Medicare Other | Admitting: Sports Medicine

## 2020-09-24 DIAGNOSIS — B351 Tinea unguium: Secondary | ICD-10-CM | POA: Diagnosis not present

## 2020-09-24 DIAGNOSIS — M79674 Pain in right toe(s): Secondary | ICD-10-CM

## 2020-09-24 DIAGNOSIS — M79675 Pain in left toe(s): Secondary | ICD-10-CM | POA: Diagnosis not present

## 2020-09-24 DIAGNOSIS — E1142 Type 2 diabetes mellitus with diabetic polyneuropathy: Secondary | ICD-10-CM | POA: Diagnosis not present

## 2020-09-24 DIAGNOSIS — S90822D Blister (nonthermal), left foot, subsequent encounter: Secondary | ICD-10-CM

## 2020-09-24 DIAGNOSIS — E1165 Type 2 diabetes mellitus with hyperglycemia: Secondary | ICD-10-CM

## 2020-09-24 NOTE — Progress Notes (Signed)
Subjective: Brittney [name] is a 74 y.o. female patient with history of diabetes who presents to office today complaining of long,mildly painful nails and callus  while ambulating in shoes; unable to trim. Patient states that her blister on left foot is doing good. Her the glucose reading this morning was not recorded but last a1c was "good" around 7. No other issues noted.    Patient Active Problem List   Diagnosis Date Noted  . UTI (urinary tract infection) 02/23/2016  . Burning with urination 02/23/2016  . Itching with irritation 02/27/2015  . Goiter diffuse, adenomatous 02/05/2014  . Diabetic polyneuropathy ([place]) 09/25/2013  . Obesity 07/06/2012  . Diabetic Charcot's joint disease ([place])   . Subclinical hyperthyroidism 06/30/2009  . Type II diabetes mellitus, uncontrolled ([place]) 06/30/2009  . Hyperlipidemia 06/30/2009  . Essential hypertension, benign 06/30/2009  . PAROXYSMAL ATRIAL FIBRILLATION 06/30/2009   Current Outpatient Medications on File Prior to Visit  Medication Sig Dispense Refill  . ACCU-CHEK AVIVA PLUS test strip USE TO TEST TWICE DAILY. 100 each 5  . B-D ULTRAFINE III SHORT PEN 31G X 8 MM MISC USE AS DIRECTED 2-3 TIMES A DAY. 100 each 11  . Blood Glucose Monitoring Suppl (ACCU-CHEK AVIVA PLUS) W/DEVICE KIT Use to check blood sugar 2 times per day dx code E11.65 1 kit 0  . cloNIDine (CATAPRES) 0.1 MG tablet TAKE (1) TABLET BY MOUTH TWICE DAILY. 180 tablet 1  . exenatide (BYETTA) 10 MCG/0.04ML SOPN injection Inject 10 mcg into the skin 2 (two) times daily with a meal. Inject 30 minutes before eating 2.4 mL 2  . gabapentin (NEURONTIN) 100 MG capsule Take 3 capsules (300 mg total) by mouth at bedtime. 90 capsule 0  . HUMALOG KWIKPEN 100 UNIT/ML KwikPen INJECT 6 TO 7 UNITS SUBCUTANEOUSLY BEFORE SUPPER IF GLUCOSE IS HIGH. 15 mL 0  . hydrochlorothiazide (MICROZIDE) 12.5 MG capsule TAKE ONE CAPSULE BY MOUTH ONCE DAILY. 30 capsule 0  . Lancets (ONETOUCH ULTRASOFT) lancets     .  LANTUS SOLOSTAR 100 UNIT/ML Solostar Pen INJECT 44 UNITS SUBCUTANEOUSLY ONCE EVERY MORNING. 15 mL 0  . metFORMIN (GLUCOPHAGE) 850 MG tablet TAKE (1) TABLET BY MOUTH TWICE DAILY. 60 tablet 0  . metoprolol tartrate (LOPRESSOR) 50 MG tablet TAKE 1/2 TABLET BY MOUTH TWICE DAILY. 30 tablet 6  . Multiple Vitamin (MULTIVITAMIN) tablet Take 1 tablet by mouth once a week.     Marland Kitchen omeprazole (PRILOSEC) 20 MG capsule Take 20 mg by mouth daily.      . polyethylene glycol powder (GLYCOLAX/MIRALAX) powder Take 1 Container by mouth as needed for moderate constipation.     . pravastatin (PRAVACHOL) 20 MG tablet Take 1 tablet (20 mg total) by mouth daily. (09/16/20 - HOLDING CURRENLTY)    . SURE COMFORT INSULIN SYRINGE 31G X 5/16" 0.3 ML MISC USE AS DIRECTED 2-3 TIMES A DAY. 100 each 0   No current facility-administered medications on file prior to visit.   Allergies  Allergen Reactions  . Latex     Itching and breaking out  . Pravastatin     Pt stated, "Made my muscle ache"    Recent Results (from the past 2160 hour(s))  POCT glycosylated hemoglobin (Hb A1C)     Status: Abnormal   Collection Time: 08/03/20 11:14 AM  Result Value Ref Range   Hemoglobin A1C 7.6 (A) 4.0 - 5.6 %   HbA1c POC (<> result, manual entry)     HbA1c, POC (prediabetic range)     HbA1c,  POC (controlled diabetic range)    TSH     Status: Abnormal   Collection Time: 08/03/20 11:51 AM  Result Value Ref Range   TSH <0.01 (L) 0.35 - 4.50 uIU/mL  Microalbumin / creatinine urine ratio     Status: None   Collection Time: 08/03/20 11:51 AM  Result Value Ref Range   Microalb, Ur 1.6 0.0 - 1.9 mg/dL   Creatinine,U 137.8 mg/dL   Microalb Creat Ratio 1.1 0.0 - 30.0 mg/g  Urinalysis, Routine w reflex microscopic     Status: Abnormal   Collection Time: 08/03/20 11:51 AM  Result Value Ref Range   Color, Urine YELLOW Yellow;Lt. Yellow;Straw;Dark Yellow;Amber;Green;Red;Brown   APPearance CLEAR Clear;Turbid;Slightly Cloudy;Cloudy   Specific  Gravity, Urine 1.020 1.000 - 1.030   pH 5.5 5.0 - 8.0   Total Protein, Urine NEGATIVE Negative   Urine Glucose NEGATIVE Negative   Ketones, ur NEGATIVE Negative   Bilirubin Urine NEGATIVE Negative   Hgb urine dipstick NEGATIVE Negative   Urobilinogen, UA 0.2 0.0 - 1.0   Leukocytes,Ua NEGATIVE Negative   Nitrite NEGATIVE Negative   WBC, UA 3-6/hpf (A) 0-2/hpf   RBC / HPF none seen 0-2/hpf   Squamous Epithelial / LPF Rare(0-4/hpf) Rare(0-4/hpf)   Bacteria, UA Few(10-50/hpf) (A) None   Yeast, UA Presence of (A) None  T3, free     Status: None   Collection Time: 08/03/20 11:51 AM  Result Value Ref Range   T3, Free 3.9 2.3 - 4.2 pg/mL  T4, free     Status: None   Collection Time: 08/03/20 11:51 AM  Result Value Ref Range   Free T4 1.03 0.60 - 1.60 ng/dL    Comment: Specimens from patients who are undergoing biotin therapy and /or ingesting biotin supplements may contain high levels of biotin.  The higher biotin concentration in these specimens interferes with this Free T4 assay.  Specimens that contain high levels  of biotin may cause false high results for this Free T4 assay.  Please interpret results in light of the total clinical presentation of the patient.    Comprehensive metabolic panel     Status: Abnormal   Collection Time: 08/03/20 11:51 AM  Result Value Ref Range   Sodium 139 135 - 145 mEq/L   Potassium 4.6 3.5 - 5.1 mEq/L   Chloride 104 96 - 112 mEq/L   CO2 27 19 - 32 mEq/L   Glucose, Bld 117 (H) 70 - 99 mg/dL   BUN 19 6 - 23 mg/dL   Creatinine, Ser 1.03 0.40 - 1.20 mg/dL   Total Bilirubin 0.4 0.2 - 1.2 mg/dL   Alkaline Phosphatase 87 39 - 117 U/L   AST 11 0 - 37 U/L   ALT 8 0 - 35 U/L   Total Protein 7.5 6.0 - 8.3 g/dL   Albumin 4.2 3.5 - 5.2 g/dL   GFR 63.52 >60.00 mL/min   Calcium 9.8 8.4 - 10.5 mg/dL  House Account Tracking only     Status: None   Collection Time: 09/15/20 11:37 AM  Result Value Ref Range   Tracking House Account      Comment: We were unable  to identify an account number for the order submitted. If you do not have a [facility]  account number or if your account information needs  to be updated please call [phone] for assistance. . To prevent delays in testing and processing of your orders please provide the following information for this order and with every  additional order submitted: [facility] account number and account name  Client address Client phone and fax number NPI number of ordering physician along with the physician name.   WOUND CULTURE     Status: None   Collection Time: 09/15/20 11:37 AM  Result Value Ref Range   MICRO NUMBER: [id number]    SPECIMEN QUALITY: Adequate    SOURCE: L LATERAL SIDE OF FOOT    STATUS: FINAL    GRAM STAIN:      Rare Polymorphonuclear leukocytes No epithelial cells seen No organisms seen   RESULT: No Growth     Objective: General: Patient is awake, alert, and oriented x 3 and in no acute distress.  Integument: Skin is warm, dry and supple bilateral. Nails are tender, long, thickened and dystrophic with subungual debris, consistent with onychomycosis, 1-5 bilateral with small fragments of nails from previous removal. No signs of infection.+ minimal reactive callus styloid left foot plantar aspect and resolved blister at left 5th MTPJ. Remaining integument unremarkable.  Neurovascular status: Unchanged from prior.   Musculoskeletal: Asymptomatic hammertoe and pes planus pedal deformities noted bilateral. + Charcot on left.  Muscular strength 4/5 on left and 5/5 on right in all lower extremity muscular groups bilateral without pain on range of motion . No tenderness with calf compression bilateral.  Assessment and Plan: Problem List Items Addressed This Visit      Endocrine   Type II diabetes mellitus, uncontrolled ([place])   Diabetic polyneuropathy ([place])    Other Visit Diagnoses    Pain due to onychomycosis of toenails of both feet    -  Primary    Blister of left foot, subsequent encounter       healed     -Examined patient. -Re-Discussed and educated patient on diabetic foot care, and to avoid ill fitting shoes -Mechanically debrided all nails 1-5 bilateral using sterile nail nipper and filed with dremel without incident  -Advised patient to d/c any medication to previous blister on left foot since it is well healed  -Continue with diabetic shoes  -Answered all patient questions -Patient to return  in 3 months for at risk foot care or sooner if problems arise  Landis Martins, DPM

## 2020-09-29 ENCOUNTER — Encounter (INDEPENDENT_AMBULATORY_CARE_PROVIDER_SITE_OTHER): Payer: Medicare Other | Admitting: Ophthalmology

## 2020-09-29 ENCOUNTER — Other Ambulatory Visit: Payer: Self-pay

## 2020-09-29 DIAGNOSIS — H35033 Hypertensive retinopathy, bilateral: Secondary | ICD-10-CM

## 2020-09-29 DIAGNOSIS — H43812 Vitreous degeneration, left eye: Secondary | ICD-10-CM | POA: Diagnosis not present

## 2020-09-29 DIAGNOSIS — I10 Essential (primary) hypertension: Secondary | ICD-10-CM | POA: Diagnosis not present

## 2020-09-29 DIAGNOSIS — E113593 Type 2 diabetes mellitus with proliferative diabetic retinopathy without macular edema, bilateral: Secondary | ICD-10-CM | POA: Diagnosis not present

## 2020-09-29 DIAGNOSIS — H35373 Puckering of macula, bilateral: Secondary | ICD-10-CM

## 2020-09-29 DIAGNOSIS — E11319 Type 2 diabetes mellitus with unspecified diabetic retinopathy without macular edema: Secondary | ICD-10-CM | POA: Diagnosis not present

## 2020-10-07 ENCOUNTER — Encounter: Payer: Self-pay | Admitting: Endocrinology

## 2020-10-07 ENCOUNTER — Other Ambulatory Visit: Payer: Self-pay

## 2020-10-07 ENCOUNTER — Ambulatory Visit (INDEPENDENT_AMBULATORY_CARE_PROVIDER_SITE_OTHER): Payer: Medicare Other | Admitting: Endocrinology

## 2020-10-07 ENCOUNTER — Other Ambulatory Visit: Payer: Self-pay | Admitting: *Deleted

## 2020-10-07 VITALS — BP 138/78 | HR 73 | Ht 67.0 in | Wt 228.6 lb

## 2020-10-07 DIAGNOSIS — Z23 Encounter for immunization: Secondary | ICD-10-CM

## 2020-10-07 DIAGNOSIS — E7800 Pure hypercholesterolemia, unspecified: Secondary | ICD-10-CM | POA: Diagnosis not present

## 2020-10-07 DIAGNOSIS — E0590 Thyrotoxicosis, unspecified without thyrotoxic crisis or storm: Secondary | ICD-10-CM | POA: Diagnosis not present

## 2020-10-07 DIAGNOSIS — Z794 Long term (current) use of insulin: Secondary | ICD-10-CM | POA: Diagnosis not present

## 2020-10-07 DIAGNOSIS — E1165 Type 2 diabetes mellitus with hyperglycemia: Secondary | ICD-10-CM | POA: Diagnosis not present

## 2020-10-07 LAB — BASIC METABOLIC PANEL
BUN: 18 mg/dL (ref 6–23)
CO2: 31 mEq/L (ref 19–32)
Calcium: 9.8 mg/dL (ref 8.4–10.5)
Chloride: 103 mEq/L (ref 96–112)
Creatinine, Ser: 0.87 mg/dL (ref 0.40–1.20)
GFR: 66.1 mL/min (ref >60.00)
Glucose, Bld: 122 mg/dL — ABNORMAL HIGH (ref 70–99)
Potassium: 4.3 mEq/L (ref 3.5–5.1)
Sodium: 140 mEq/L (ref 135–145)

## 2020-10-07 LAB — LIPID PANEL
Cholesterol: 164 mg/dL (ref 0–200)
HDL: 62.2 mg/dL (ref >39.00)
LDL Cholesterol: 83 mg/dL (ref 0–99)
NonHDL: 101.43
Total CHOL/HDL Ratio: 3
Triglycerides: 92 mg/dL (ref 0.0–149.0)
VLDL: 18.4 mg/dL (ref 0.0–40.0)

## 2020-10-07 LAB — T4, FREE: Free T4: 1.07 ng/dL (ref 0.60–1.60)

## 2020-10-07 LAB — T3, FREE: T3, Free: 3.7 pg/mL (ref 2.3–4.2)

## 2020-10-07 MED ORDER — FREESTYLE LIBRE 2 READER DEVI: 0 refills | Status: DC

## 2020-10-07 NOTE — Progress Notes (Signed)
Patient ID: Brittney [name], female   DOB: 01-Jul-1946, 74 y.o.   MRN: [id number]     Reason for Appointment: Follow-up visit  History of Present Illness    Type 2 DIABETES MELITUS, date of diagnosis: 1976      She has had long-standing diabetes taking insulin. Because of her difficulties with compliance using mealtime insulin she was given in Byetta in 2011 and subsequently Victoza in addition to her Lantus and this helped her control However she tends to be erratic with her day-to-day care and has not had adequate A1c levels in the past, usually over 7%  RECENT history:   Insulin regimen: Lantus 40 units daily in a.m. Humalog 5 units occasionally at dinner  Non-insulin hypoglycemic drugs: taking Byetta 10 mcg before meals,   Metformin  850 twice a day   Her A1c has recently been 7.6   Current glucose patterns and problems:  She was told to start taking Byetta back again on the last visit and she is doing this, reportedly twice a day before breakfast and dinner  However despite having significantly high readings as much as 370 she is not taking any mealtime insulin as directed  She appears to have frequently high readings  before dinnertime possibly from eating carbohydrates like corn bread at lunchtime  Blood sugar usually is not high at lunchtime when checked in the office although not checking at home  Also difficult to interpret her home monitor because of her time being programmed incorrectly  With reducing her LANTUS by 4 units she has had only 1 relatively low sugar of 67 in the morning, previously had readings as low as 52 occasionally  Weight is down 2 pounds with restarting Byetta  Has only occasional blood sugar readings at bedtime possibly after taking Humalog at dinnertime  Side effects from medications:  Invokana: ? Headache.  Victoza ?  Swelling in neck      Monitors blood glucose: Up to 2-3 times a day     Glucometer: Accucheck         Blood  Glucose from download   PRE-MEAL Fasting Lunch Dinner Bedtime Overall  Glucose range:  57-157   138-370  142, 126   Mean/median:  125   190  147   Previous readings:  PRE-MEAL Fasting Lunch Dinner  overnight Overall  Glucose range:  52-208 ?     Mean/median:  102    163  128   POST-MEAL PC Breakfast PC Lunch PC Dinner  Glucose range:    126-268  Mean/median:    185       Meals: 2-3 meals per day. . Dinner 6-7 pm, variable quantity and carbohydrates  Does have protein with breakfast consistently, has egg for protein   Wt Readings from Last 3 Encounters:  10/07/20 228 lb 9.6 oz (103.7 kg)  09/16/20 228 lb (103.4 kg)  08/03/20 230 lb (104.3 kg)   GLYCEMIC CONTROL:  Lab Results  Component Value Date   HGBA1C 7.6 (A) 08/03/2020   HGBA1C 8.2 (A) 03/30/2020   HGBA1C 8.1 (A) 12/30/2019   Lab Results  Component Value Date   MICROALBUR 1.6 08/03/2020   LDLCALC 99 12/30/2019   CREATININE 1.03 08/03/2020   Lab Results  Component Value Date   FRUCTOSAMINE 345 (H) 03/30/2020   FRUCTOSAMINE 342 (H) 09/08/2017      Other problems discussed today: See review of systems   Allergies as of 10/07/2020      Reactions  Latex    Itching and breaking out   Pravastatin    Pt stated, "Made my muscle ache"      Medication List       Accurate as of October 07, 2020 10:39 AM. If you have any questions, ask your nurse or doctor.        Accu-Chek Aviva Plus test strip Generic drug: glucose blood USE TO TEST TWICE DAILY.   Accu-Chek Aviva Plus w/Device Kit Use to check blood sugar 2 times per day dx code E11.65   B-D ULTRAFINE III SHORT PEN 31G X 8 MM Misc Generic drug: Insulin Pen Needle USE AS DIRECTED 2-3 TIMES A DAY.   cloNIDine 0.1 MG tablet Commonly known as: CATAPRES TAKE (1) TABLET BY MOUTH TWICE DAILY.   exenatide 10 MCG/0.04ML Sopn injection Commonly known as: BYETTA Inject 10 mcg into the skin 2 (two) times daily with a meal. Inject 30 minutes before  eating   gabapentin 100 MG capsule Commonly known as: NEURONTIN Take 3 capsules (300 mg total) by mouth at bedtime.   HumaLOG KwikPen 100 UNIT/ML KwikPen Generic drug: insulin lispro INJECT 6 TO 7 UNITS SUBCUTANEOUSLY BEFORE SUPPER IF GLUCOSE IS HIGH.   hydrochlorothiazide 12.5 MG capsule Commonly known as: MICROZIDE TAKE ONE CAPSULE BY MOUTH ONCE DAILY.   Lantus SoloStar 100 UNIT/ML Solostar Pen Generic drug: insulin glargine INJECT 44 UNITS SUBCUTANEOUSLY ONCE EVERY MORNING. What changed: See the new instructions.   metFORMIN 850 MG tablet Commonly known as: GLUCOPHAGE TAKE (1) TABLET BY MOUTH TWICE DAILY.   metoprolol tartrate 50 MG tablet Commonly known as: LOPRESSOR TAKE 1/2 TABLET BY MOUTH TWICE DAILY.   multivitamin tablet Take 1 tablet by mouth once a week.   omeprazole 20 MG capsule Commonly known as: PRILOSEC Take 20 mg by mouth daily.   onetouch ultrasoft lancets   polyethylene glycol powder 17 GM/SCOOP powder Commonly known as: GLYCOLAX/MIRALAX Take 1 Container by mouth as needed for moderate constipation.   pravastatin 20 MG tablet Commonly known as: PRAVACHOL Take 1 tablet (20 mg total) by mouth daily. (09/16/20 - HOLDING CURRENLTY)   Sure Comfort Insulin Syringe 31G X 5/16" 0.3 ML Misc Generic drug: Insulin Syringe-Needle U-100 USE AS DIRECTED 2-3 TIMES A DAY.       Allergies:  Allergies  Allergen Reactions  . Latex     Itching and breaking out  . Pravastatin     Pt stated, "Made my muscle ache"    Past Medical History:  Diagnosis Date  . Atrial fibrillation (HCC)    Paroxysmal; LVH; nl EF; onset in 1999  . Burning with urination 02/23/2016  . Diabetes mellitus    insulin; managed by Dr. Dwyane Dee  . 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 Hyperlipidemia   . Hypertension   . Hypothyroidism    history of goiter; nl TSH off medication  . Itching with irritation 02/27/2015  . Obesity 07/06/2012  . Palpitations     negative event recorder in 2009  . UTI (urinary tract infection) 02/23/2016    Past Surgical History:  Procedure Laterality Date  . BIOPSY THYROID    . CESAREAN SECTION    . CHOLECYSTECTOMY  11/95  . COLONOSCOPY  2007  . RETINAL DETACHMENT SURGERY  2009  . TUBAL LIGATION  1980's    Family History  Problem Relation Age of Onset  . Diabetes Mother   . Diabetes Daughter        gestational diabetes  .  Cancer Father   . Thyroid disease Brother   . Other Son        MVA    Social History:  reports that she quit smoking about 34 years ago. Her smoking use included cigarettes. She started smoking about 53 years ago. She smoked 1.00 pack per day. She has never used smokeless tobacco. She reports that she does not drink alcohol and does not use drugs.  Review of Systems:   HYPERTENSION:  Previously followed by PCP, had been on Norvasc, MAXZIDE and Benicar  Because of high potassium and increased creatinine her Benicar and Maxzide had been stopped She is on the clonidine 0.1 mg bid along with HCTZ 12.5 mg daily, also on 50 mg Toprol Previously on the clonidine patch she had some itching at the site  Also checking at home periodically  BP Readings from Last 3 Encounters:  10/07/20 138/78  09/16/20 (!) 142/80  08/03/20 (!) 144/68     HYPERLIPIDEMIA: The lipid abnormality consists of elevated LDL; she does not take her pravastatin daily because of symptoms of muscle aches and cramping   Lab Results  Component Value Date   CHOL 191 12/30/2019   HDL 62.10 12/30/2019   LDLCALC 99 12/30/2019   TRIG 148.0 12/30/2019   CHOLHDL 3 12/30/2019    MULTINODULAR GOITER: She has had a long-standing multinodular goiter since at least 2005 which has been fairly stable clinically She does have history of occasional palpitations for years Metoprolol has been prescribed by cardiologist  She has no local pressure symptoms in the neck or swallowing difficulty However she thinks her neck is  swelling more this year  Her goiter has been autonomous with no evidence of overt hyperthyroidism  with normal free T4 and free T3 levels; TSH has been consistently suppressed Last free T3 was upper normal at 3.9   Her I-131 uptake in 2003 was 17% and her  thyroid ultrasound in 2014 did not show distinct nodules but a relative increase in size  Lab Results  Component Value Date   TSH <0.01 (L) 08/03/2020   TSH <0.01 (L) 12/30/2019   FREET4 1.03 08/03/2020   FREET4 1.16 12/30/2019   Lab Results  Component Value Date   T3FREE 3.9 08/03/2020   T3FREE 3.4 12/30/2019   T3FREE 3.7 10/01/2019       Foot exam done 12/30/2019 She has some numbness in her toes which is mild and chronic  This is better with gabapentin but she does get pain when she is trying to walk She has started using diabetic shoes  Also periodically seen by podiatrist   Examination:   BP 138/78   Pulse 73   Ht _0  (1.702 m)   Wt 228 lb 9.6 oz (103.7 kg)   SpO2 96%   BMI 35.80 kg/m   Body mass index is 35.8 kg/m.      ASSESSMENT/ PLAN:  Diabetes Type 2 with obesity on insulin, Byetta and metformin  See history of present illness for detailed discussion of  current management, blood sugar patterns and problems identified  Her A1c is last 7.6  As above she has significant postprandial hyperglycemia at times based on her diet She still appears to be insulin deficient and not completely benefiting from Byetta although her weight is starting to come back down She does not check her sugars after meals much and still does not understand what carbohydrates are and when to take the Humalog  Recommendations:  She needs to take  Humalog with any meal that has carbohydrate including lunch She can take 4 to 6 units based on meal size Again discussed that she can take the insulin regardless of Premeal blood sugar  Needs to continue Byetta to 10 mcg, she can take this 30 minutes before eating and she can take her  Humalog at the same time She will continue to take 40 units of Lantus  She was going to be instructed today on the freestyle libre system and discussed in detail how this works and how she will use it especially to monitor postprandial readings Given her information on where to order this from  HYPERTENSION: Blood pressure is again fairly well controlled  Multinodular goiter: Discussed that unless she has hyperthyroidism will not be able to treat her with medical modalities and she is not keen on surgery  Check labs today for lipids   Flu vaccine given  To have labs checked today     There are no Patient Instructions on file for this visit.    Elayne Snare 10/07/2020, 10:39 AM

## 2020-10-07 NOTE — Patient Instructions (Signed)
Take 4-6 Novolog with every meal that has starchy foods  Check blood sugars on waking up 3-4 days a week  Also check blood sugars about 2 hours after meals and do this after different meals by rotation  Recommended blood sugar levels on waking up are 90-130 and about 2 hours after meal is 130-160  Please bring your blood sugar monitor to each visit, thank you

## 2020-10-09 ENCOUNTER — Other Ambulatory Visit: Payer: Self-pay | Admitting: Endocrinology

## 2020-10-12 ENCOUNTER — Telehealth: Payer: Self-pay | Admitting: Cardiology

## 2020-10-12 DIAGNOSIS — Z79899 Other long term (current) drug therapy: Secondary | ICD-10-CM

## 2020-10-12 MED ORDER — HYDROCHLOROTHIAZIDE 25 MG PO TABS: 25.0000 mg | ORAL_TABLET | Freq: Every day | ORAL | 3 refills | Status: DC

## 2020-10-12 NOTE — Telephone Encounter (Signed)
Covering for Dr. Harl Bowie.  Reviewed most recent note.  Suggest increasing HCTZ to 25 mg daily.  Currently not on potassium supplement, but both potassium and renal function normal by recent lab work.  Check BMET for Dr. Harl Bowie in 7 to 10 days.

## 2020-10-12 NOTE — Telephone Encounter (Signed)
Pt agrees to increase HCTZ to 25 mg daily and repeat BMET in 1 week. I mailed her lab slip

## 2020-10-12 NOTE — Telephone Encounter (Signed)
New message    BP reading  136/78  134/80 142/64 140/80 130/78 177/70

## 2020-10-12 NOTE — Telephone Encounter (Signed)
Seen in office on 09/16/20 by Dr.Branch with elevated BP but had not had taken her meds yet.Was told to call back with readings.

## 2020-10-13 DIAGNOSIS — I10 Essential (primary) hypertension: Secondary | ICD-10-CM | POA: Diagnosis not present

## 2020-10-13 DIAGNOSIS — M1830 Unilateral post-traumatic osteoarthritis of first carpometacarpal joint, unspecified hand: Secondary | ICD-10-CM | POA: Diagnosis not present

## 2020-10-13 DIAGNOSIS — E1129 Type 2 diabetes mellitus with other diabetic kidney complication: Secondary | ICD-10-CM | POA: Diagnosis not present

## 2020-10-13 DIAGNOSIS — M79601 Pain in right arm: Secondary | ICD-10-CM | POA: Diagnosis not present

## 2020-10-19 ENCOUNTER — Telehealth: Payer: Self-pay | Admitting: Endocrinology

## 2020-10-19 DIAGNOSIS — I739 Peripheral vascular disease, unspecified: Secondary | ICD-10-CM | POA: Diagnosis not present

## 2020-10-19 DIAGNOSIS — L84 Corns and callosities: Secondary | ICD-10-CM | POA: Diagnosis not present

## 2020-10-19 DIAGNOSIS — E1051 Type 1 diabetes mellitus with diabetic peripheral angiopathy without gangrene: Secondary | ICD-10-CM | POA: Diagnosis not present

## 2020-10-19 DIAGNOSIS — L603 Nail dystrophy: Secondary | ICD-10-CM | POA: Diagnosis not present

## 2020-10-19 NOTE — Telephone Encounter (Signed)
I spoke with her about the freestyle [place] reader, advised that it was sent in to [facility] on 11/17. She will check to see if they have it. And let me know if I need to resend it.

## 2020-10-19 NOTE — Telephone Encounter (Signed)
Pt called requesting nurse return her call regarding a "device that goes on her arm to monitor blood sugar."  Pt ph [phone].

## 2020-10-20 DIAGNOSIS — E785 Hyperlipidemia, unspecified: Secondary | ICD-10-CM | POA: Diagnosis not present

## 2020-10-20 DIAGNOSIS — E1122 Type 2 diabetes mellitus with diabetic chronic kidney disease: Secondary | ICD-10-CM | POA: Diagnosis not present

## 2020-10-20 DIAGNOSIS — N183 Chronic kidney disease, stage 3 unspecified: Secondary | ICD-10-CM | POA: Diagnosis not present

## 2020-10-20 DIAGNOSIS — I129 Hypertensive chronic kidney disease with stage 1 through stage 4 chronic kidney disease, or unspecified chronic kidney disease: Secondary | ICD-10-CM | POA: Diagnosis not present

## 2020-10-22 ENCOUNTER — Ambulatory Visit (HOSPITAL_COMMUNITY)
Admission: RE | Admit: 2020-10-22 | Discharge: 2020-10-22 | Disposition: A | Discharge status: Home / Self Care | Payer: Medicare Other | Source: Ambulatory Visit | Attending: Family Medicine | Admitting: Family Medicine

## 2020-10-22 ENCOUNTER — Other Ambulatory Visit (HOSPITAL_COMMUNITY)
Admission: RE | Admit: 2020-10-22 | Discharge: 2020-10-22 | Disposition: A | Discharge status: Home / Self Care | Payer: Medicare Other | Source: Ambulatory Visit | Attending: Cardiology | Admitting: Cardiology

## 2020-10-22 ENCOUNTER — Other Ambulatory Visit: Payer: Self-pay

## 2020-10-22 DIAGNOSIS — Z1231 Encounter for screening mammogram for malignant neoplasm of breast: Secondary | ICD-10-CM

## 2020-10-22 DIAGNOSIS — Z79899 Other long term (current) drug therapy: Secondary | ICD-10-CM | POA: Insufficient documentation

## 2020-10-22 LAB — BASIC METABOLIC PANEL
Anion gap: 7 (ref 5–15)
BUN: 24 mg/dL — ABNORMAL HIGH (ref 8–23)
CO2: 28 mmol/L (ref 22–32)
Calcium: 9.4 mg/dL (ref 8.9–10.3)
Chloride: 101 mmol/L (ref 98–111)
Creatinine, Ser: 0.92 mg/dL (ref 0.44–1.00)
GFR, Estimated: >60 mL/min (ref >60)
Glucose, Bld: 250 mg/dL — ABNORMAL HIGH (ref 70–99)
Potassium: 4.6 mmol/L (ref 3.5–5.1)
Sodium: 136 mmol/L (ref 135–145)

## 2020-10-30 ENCOUNTER — Telehealth: Payer: Self-pay | Admitting: *Deleted

## 2020-10-30 NOTE — Telephone Encounter (Signed)
-----   Message from Arnoldo Lenis, MD sent at 10/30/2020 11:12 AM EST ----- Normal labs  Zandra Abts MD

## 2020-11-02 NOTE — Telephone Encounter (Signed)
Pt aware.

## 2020-11-06 ENCOUNTER — Telehealth: Payer: Self-pay | Admitting: Endocrinology

## 2020-11-06 NOTE — Telephone Encounter (Signed)
Hilliard Clark from [facility] called stating he had been speaking to Belenda Cruise the past few days and she confirmed with him that we have received paperwork for patient's Continuous Glucose Monitor, I do not see any documentation on this, I informed him I was not aware of what he was referring to since there was nothing documented. Pharmacy rep was very unhappy, stating that he was told the patient's chart had been updated and Dr Dwyane Dee had received the forms and was completing them on 12/15. Dr Dwyane Dee, Suanne Marker and Belenda Cruise have left for the day so I have no way of knowing what this person is referring to.  While on the phone with Hilliard Clark, I had him send the fax again, I HAVE received it and I have placed it in Dr Ronnie Derby box, I informed Hilliard Clark that Dr Dwyane Dee will return in the office on Monday. FYI

## 2020-11-09 NOTE — Telephone Encounter (Signed)
The paperwork was faxed back this morning.

## 2020-11-11 ENCOUNTER — Other Ambulatory Visit: Payer: Self-pay | Admitting: Cardiology

## 2020-11-11 ENCOUNTER — Other Ambulatory Visit: Payer: Self-pay | Admitting: Endocrinology

## 2020-11-18 NOTE — Telephone Encounter (Signed)
Chart notes were re faxed

## 2020-11-18 NOTE — Telephone Encounter (Signed)
Gregary Signs from [facility] called back stating that they received the faxed paperwork but that they also needed the most recent progress notes.  Please sent those to the fax number used today

## 2020-12-03 ENCOUNTER — Other Ambulatory Visit: Payer: Self-pay | Admitting: Endocrinology

## 2020-12-15 ENCOUNTER — Ambulatory Visit: Payer: Medicare Other | Admitting: Endocrinology

## 2020-12-31 ENCOUNTER — Ambulatory Visit: Payer: Medicare Other | Admitting: Sports Medicine

## 2021-01-18 DIAGNOSIS — I10 Essential (primary) hypertension: Secondary | ICD-10-CM | POA: Diagnosis not present

## 2021-01-18 DIAGNOSIS — I129 Hypertensive chronic kidney disease with stage 1 through stage 4 chronic kidney disease, or unspecified chronic kidney disease: Secondary | ICD-10-CM | POA: Diagnosis not present

## 2021-01-18 DIAGNOSIS — E1122 Type 2 diabetes mellitus with diabetic chronic kidney disease: Secondary | ICD-10-CM | POA: Diagnosis not present

## 2021-01-18 DIAGNOSIS — E785 Hyperlipidemia, unspecified: Secondary | ICD-10-CM | POA: Diagnosis not present

## 2021-01-18 DIAGNOSIS — E1142 Type 2 diabetes mellitus with diabetic polyneuropathy: Secondary | ICD-10-CM | POA: Diagnosis not present

## 2021-01-18 DIAGNOSIS — N183 Chronic kidney disease, stage 3 unspecified: Secondary | ICD-10-CM | POA: Diagnosis not present

## 2021-01-18 DIAGNOSIS — K5900 Constipation, unspecified: Secondary | ICD-10-CM | POA: Diagnosis not present

## 2021-01-29 ENCOUNTER — Other Ambulatory Visit: Payer: Self-pay | Admitting: Endocrinology

## 2021-02-24 ENCOUNTER — Other Ambulatory Visit: Payer: Self-pay | Admitting: *Deleted

## 2021-02-24 ENCOUNTER — Telehealth: Payer: Self-pay | Admitting: Endocrinology

## 2021-02-24 DIAGNOSIS — Z794 Long term (current) use of insulin: Secondary | ICD-10-CM

## 2021-02-24 DIAGNOSIS — E1165 Type 2 diabetes mellitus with hyperglycemia: Secondary | ICD-10-CM

## 2021-02-24 MED ORDER — ACCU-CHEK GUIDE ME W/DEVICE KIT: PACK | 0 refills | Status: AC

## 2021-02-24 NOTE — Telephone Encounter (Signed)
New message    Patient current blood sugar machine ACCU-CHEK AVIVA PLUS) is not longer available.  Patient has not check her blood sugar in  7 days .   Asking for a prescription called in [place], [place]. Aware that MD is not in the office until next week.

## 2021-02-24 NOTE — Telephone Encounter (Signed)
Sent accu-chek Guide Me--to [facility].

## 2021-03-01 ENCOUNTER — Other Ambulatory Visit: Payer: Self-pay | Admitting: Endocrinology

## 2021-03-02 DIAGNOSIS — E119 Type 2 diabetes mellitus without complications: Secondary | ICD-10-CM | POA: Diagnosis not present

## 2021-03-02 DIAGNOSIS — Z794 Long term (current) use of insulin: Secondary | ICD-10-CM | POA: Diagnosis not present

## 2021-03-08 ENCOUNTER — Other Ambulatory Visit: Payer: Self-pay

## 2021-03-08 NOTE — Progress Notes (Signed)
error 

## 2021-03-17 NOTE — Progress Notes (Signed)
Cardiology Office Note    Date:  03/30/2021   ID:  Brittney [name], DOB 04-19-1946, MRN [id number]   PCP:  Brittney [name], [place]  Cardiologist:  Brittney Dolly, MD  Advanced Practice Provider:  No care team member to display Electrophysiologist:  None   [id number]}   Chief Complaint  Patient presents with  . Follow-up    History of Present Illness:  Brittney [name] is a 75 y.o. female with history of hypertension, DM, HLD, remote paroxysmal atrial fibrillation and has never been anticoagulated.  Has had normal LV function on echo and no arrhythmias on event monitor.  Maintained on metoprolol.    Patient saw Brittney [name] 08/2020 at which time her blood pressure was high.  Was to keep track of her blood pressures at home and they were still running high so he increased her HCTZ to 25 mg once daily.'s follow-up labs were normal.  Patient comes in for f/u. BP initially came down but patient hasn't checked it recently.Was aggravated before she came here waiting for a ride. Has large goiter which has worsened on byetta which she stopped. Sh has occasional palpitations which wake her up at night. Not like it used to be but still occurs.   Past Medical History:  Diagnosis Date  . Atrial fibrillation (HCC)    Paroxysmal; LVH; nl EF; onset in 1999  . Burning with urination 02/23/2016  . Diabetes mellitus    insulin; managed by Dr. Dwyane [name]  . Diabetic Charcot's joint disease ([place])    left lower extremity  . Hyperlipidemia   . Hypertension   . Hypothyroidism    history of goiter; nl TSH off medication  . Itching with irritation 02/27/2015  . Obesity 07/06/2012  . Palpitations    negative event recorder in 2009  . UTI (urinary tract infection) 02/23/2016    Past Surgical History:  Procedure Laterality Date  . BIOPSY THYROID    . CESAREAN SECTION    . CHOLECYSTECTOMY  11/95  . COLONOSCOPY  2007  . RETINAL DETACHMENT SURGERY  2009  . TUBAL LIGATION   1980's    Current Medications: Current Meds  Medication Sig  . ACCU-CHEK AVIVA PLUS test strip USE TO TEST TWICE DAILY.  Marland Kitchen B-D ULTRAFINE III SHORT PEN 31G X 8 MM MISC USE AS DIRECTED 2-3 TIMES A DAY.  Marland Kitchen Blood Glucose Monitoring Suppl (ACCU-CHEK GUIDE ME) w/Device KIT Use to check blood sugar 2 times per day dx code E11.65  . BYETTA 10 MCG PEN 10 MCG/0.04ML SOPN injection INJECT 10MCG INTO THESKIN TWO TIMES DAILY WITH A MEAL. INJECT 30 MINUTES PRIOR TO EATING.  . cloNIDine (CATAPRES) 0.1 MG tablet TAKE (1) TABLET BY MOUTH TWICE DAILY.  Marland Kitchen Continuous Blood Gluc Receiver (FREESTYLE LIBRE 2 READER) DEVI Use to monitor blood sugar  . gabapentin (NEURONTIN) 100 MG capsule Take 3 capsules (300 mg total) by mouth at bedtime. (Patient taking differently: Take 100 mg by mouth at bedtime.)  . HUMALOG KWIKPEN 100 UNIT/ML KwikPen INJECT 6 TO 7 UNITS SUBCUTANEOUSLY BEFORE SUPPER IF GLUCOSE IS HIGH.  . hydrochlorothiazide (HYDRODIURIL) 25 MG tablet Take 1 tablet (25 mg total) by mouth daily.  . Lancets (ONETOUCH ULTRASOFT) lancets   . LANTUS SOLOSTAR 100 UNIT/ML Solostar Pen INJECT 40 UNITS SUBCUTANEOUSLY ONCE EVERY MORNING.  . metFORMIN (GLUCOPHAGE) 850 MG tablet TAKE (1) TABLET BY MOUTH TWICE DAILY.  . metoprolol tartrate (LOPRESSOR) 50 MG tablet TAKE 1/2 TABLET BY MOUTH  TWICE DAILY.  . Multiple Vitamin (MULTIVITAMIN) tablet Take 1 tablet by mouth once a week.   Marland Kitchen omeprazole (PRILOSEC) 20 MG capsule Take 20 mg by mouth daily.  . polyethylene glycol powder (GLYCOLAX/MIRALAX) powder Take 1 Container by mouth as needed for moderate constipation.   Brittney [name] COMFORT INSULIN SYRINGE 31G X 5/16" 0.3 ML MISC USE AS DIRECTED 2-3 TIMES A DAY.     Allergies:   Latex and Pravastatin   Social History   Socioeconomic History  . Marital status: Married    Spouse name: Not on file  . Number of children: Not on file  . Years of education: Not on file  . Highest education level: Not on file  Occupational History   . Occupation: Retired  Tobacco Use  . Smoking status: Former Smoker    Packs/day: 1.00    Types: Cigarettes    Start date: 11/21/1966    Quit date: 08/13/1986    Years since quitting: 34.6  . Smokeless tobacco: Never Used  Vaping Use  . Vaping Use: Never used  Substance and Sexual Activity  . Alcohol use: No    Alcohol/week: 0.0 standard drinks  . Drug use: No  . Sexual activity: Never    Birth control/protection: Post-menopausal  Other Topics Concern  . Not on file  Social History Narrative   No regular exercise   Social Determinants of Health   Financial Resource Strain: Not on file  Food Insecurity: Not on file  Transportation Needs: Not on file  Physical Activity: Not on file  Stress: Not on file  Social Connections: Not on file     Family History:  The patient's family history includes Cancer in her father; Diabetes in her daughter and mother; Other in her son; Thyroid disease in her brother.   ROS:   Please see the history of present illness.    ROS All other systems reviewed and are negative.   PHYSICAL EXAM:   VS:  BP 120/60   Pulse 90   Ht 5' 7" (1.702 m)   Wt 222 lb 3.2 oz (100.8 kg)   SpO2 99%   BMI 34.80 kg/m  repeat BP 120/60 Physical Exam  GEN: Obese, in no acute distress  Neck: large goiter right > left, no JVD, carotid bruits,  Cardiac:RRR; no murmurs, rubs, or gallops  Respiratory:  clear to auscultation bilaterally, normal work of breathing GI: soft, nontender, nondistended, + BS Ext: without cyanosis, clubbing, or edema, Good distal pulses bilaterally Neuro:  Alert and Oriented x 3 Psych: euthymic mood, full affect  Wt Readings from Last 3 Encounters:  03/30/21 222 lb 3.2 oz (100.8 kg)  10/07/20 228 lb 9.6 oz (103.7 kg)  09/16/20 228 lb (103.4 kg)      Studies/Labs Reviewed:   EKG:  EKG is not ordered today.   Recent Labs: 08/03/2020: ALT 8; TSH <0.01 10/22/2020: BUN 24; Creatinine, Ser 0.92; Potassium 4.6; Sodium 136   Lipid  Panel    Component Value Date/Time   CHOL 164 10/07/2020 1132   TRIG 92.0 10/07/2020 1132   HDL 62.20 10/07/2020 1132   CHOLHDL 3 10/07/2020 1132   VLDL 18.4 10/07/2020 1132   LDLCALC 83 10/07/2020 1132    Additional studies/ records that were reviewed today include:    Risk Assessment/Calculations:    CHA2DS2-VASc Score = 4  This indicates a 4.8% annual risk of stroke. The patient's score is based upon: CHF History: No HTN History: Yes Diabetes History: Yes Stroke History:  No Vascular Disease History: No Age Score: 1 Gender Score: 1        ASSESSMENT:    1. Paroxysmal atrial fibrillation (HCC)   2. Essential hypertension   3. Hyperlipidemia, unspecified hyperlipidemia type   4. Essential hypertension, benign   5. Uncontrolled type 2 diabetes mellitus with hypoglycemia, unspecified hypoglycemia coma status ([place])   6. Diabetic Charcot's joint disease ([place])   7. Class 2 severe obesity due to excess calories with serious comorbidity in adult, unspecified BMI (HCC)      PLAN:  In order of problems listed above: PAF remote history without clear recurrence not anticoagulated. Having palpitations at night, not as bad as when she had afib. CHADSVASC=4. Will place monitor to make sure no afib  Hypertension BP up initially but recheck and BP normal. No changes  HLD not on meds LDL 83 in Nove 2021  DM type II-goiter and next swelling on byetta-has f/u this month  Obesity-exercise and weight loss   Shared Decision Making/Informed Consent        Medication Adjustments/Labs and Tests Ordered: Current medicines are reviewed at length with the patient today.  Concerns regarding medicines are outlined above.  Medication changes, Labs and Tests ordered today are listed in the Patient Instructions below. Patient Instructions   Medication Instructions:  Your physician recommends that you continue on your current medications as directed. Please refer to the Current  Medication list given to you today.  *If you need a refill on your cardiac medications before your next appointment, please call your pharmacy*   Lab Work: NONE   If you have labs (blood work) drawn today and your tests are completely normal, you will receive your results only by: Marland Kitchen MyChart Message (if you have MyChart) OR . A paper copy in the mail If you have any lab test that is abnormal or we need to change your treatment, we will call you to review the results.   Testing/Procedures: [facility]- Long Term Monitor Instructions   Your physician has requested you wear your ZIO patch monitor___14____days.   This is a single patch monitor.  [employer] supplies one patch monitor per enrollment.  Additional stickers are not available.   Please do not apply patch if you will be having a Nuclear Stress Test, Echocardiogram, Cardiac CT, MRI, or Chest Xray during the time frame you would be wearing the monitor. The patch cannot be worn during these tests.  You cannot remove and re-apply the ZIO XT patch monitor.   Your ZIO patch monitor will be sent USPS Priority mail from [facility] directly to your home address. The monitor may also be mailed to a PO BOX if home delivery is not available.   It may take 3-5 days to receive your monitor after you have been enrolled.   Once you have received you monitor, please review enclosed instructions.  Your monitor has already been registered assigning a specific monitor serial # to you.   Applying the monitor   Shave hair from upper left chest.   Hold abrader disc by orange tab.  Rub abrader in 40 strokes over left upper chest as indicated in your monitor instructions.   Clean area with 4 enclosed alcohol pads .  Use all pads to assure are is cleaned thoroughly.  Let dry.   Apply patch as indicated in monitor instructions.  Patch will be place under collarbone on left side of chest with arrow pointing upward.   Rub patch adhesive wings for  2  minutes.Remove white label marked "1".  Remove white label marked "2".  Rub patch adhesive wings for 2 additional minutes.   While looking in a mirror, press and release button in center of patch.  A small green light will flash 3-4 times .  This will be your only indicator the monitor has been turned on.     Do not shower for the first 24 hours.  You may shower after the first 24 hours.   Press button if you feel a symptom. You will hear a small click.  Record Date, Time and Symptom in the Patient Log Book.   When you are ready to remove patch, follow instructions on last 2 pages of Patient Log Book.  Stick patch monitor onto last page of Patient Log Book.   Place Patient Log Book in [place] box.  Use locking tab on box and tape box closed securely.  The [employer] has [employer] on it.  Please place in mailbox as soon as possible.  Your physician should have your test results approximately 7 days after the monitor has been mailed back to [facility].   Call [facility] at [phone] if you have questions regarding your ZIO XT patch monitor.  Call them immediately if you see an orange light blinking on your monitor.   If your monitor falls off in less than 4 days contact our Monitor department at [phone].  If your monitor becomes loose or falls off after 4 days call [employer] at [phone] for suggestions on securing your monitor.   Follow-Up: At [facility], you and your health needs are our priority.  As part of our continuing mission to provide you with exceptional heart care, we have created designated Provider Care Teams.  These Care Teams include your primary Cardiologist (physician) and Advanced Practice Providers (APPs -  Physician Assistants and Nurse Practitioners) who all work together to provide you with the care you need, when you need it.  We recommend signing up for the patient portal called "MyChart".  Sign up information is provided on  this After Visit Summary.  MyChart is used to connect with patients for Virtual Visits (Telemedicine).  Patients are able to view lab/test results, encounter notes, upcoming appointments, etc.  Non-urgent messages can be sent to your provider as well.   To learn more about what you can do with MyChart, go to [email or web address].    Your next appointment:   6 month(s)  The format for your next appointment:   In Person  Provider:   Carlyle Dolly, MD   Other Instructions Thank you for choosing [facility]!  Two Gram Sodium Diet 2000 mg  What is Sodium? Sodium is a mineral found naturally in many foods. The most significant source of sodium in the diet is table salt, which is about 40% sodium.  Processed, convenience, and preserved foods also contain a large amount of sodium.  The body needs only 500 mg of sodium daily to function,  A normal diet provides more than enough sodium even if you do not use salt.  Why Limit Sodium? A build up of sodium in the body can cause thirst, increased blood pressure, shortness of breath, and water retention.  Decreasing sodium in the diet can reduce edema and risk of heart attack or stroke associated with high blood pressure.  Keep in mind that there are many other factors involved in these health problems.  Heredity, obesity, lack of exercise, cigarette  smoking, stress and what you eat all play a role.  General Guidelines:  Do not add salt at the table or in cooking.  One teaspoon of salt contains over 2 grams of sodium.  Read food labels  Avoid processed and convenience foods  Ask your dietitian before eating any foods not dicussed in the menu planning guidelines  Consult your physician if you wish to use a salt substitute or a sodium containing medication such as antacids.  Limit milk and milk products to 16 oz (2 cups) per day.  Shopping Hints:  READ LABELS!! "Dietetic" does not necessarily mean low sodium.  Salt and other  sodium ingredients are often added to foods during processing.   Menu Planning Guidelines Food Group Choose More Often Avoid  Beverages (see also the milk group All fruit juices, low-sodium, salt-free vegetables juices, low-sodium carbonated beverages Regular vegetable or tomato juices, commercially softened water used for drinking or cooking  Breads and Cereals Enriched white, wheat, rye and pumpernickel bread, hard rolls and dinner rolls; muffins, cornbread and waffles; most dry cereals, cooked cereal without added salt; unsalted crackers and breadsticks; low sodium or homemade bread crumbs Bread, rolls and crackers with salted tops; quick breads; instant hot cereals; pancakes; commercial bread stuffing; self-rising flower and biscuit mixes; regular bread crumbs or cracker crumbs  Desserts and Sweets Desserts and sweets mad with mild should be within allowance Instant pudding mixes and cake mixes  Fats Butter or margarine; vegetable oils; unsalted salad dressings, regular salad dressings limited to 1 Tbs; light, sour and heavy cream Regular salad dressings containing bacon fat, bacon bits, and salt pork; snack dips made with instant soup mixes or processed cheese; salted nuts  Fruits Most fresh, frozen and canned fruits Fruits processed with salt or sodium-containing ingredient (some dried fruits are processed with sodium sulfites        Vegetables Fresh, frozen vegetables and low- sodium canned vegetables Regular canned vegetables, sauerkraut, pickled vegetables, and others prepared in brine; frozen vegetables in sauces; vegetables seasoned with ham, bacon or salt pork  Condiments, Sauces, Miscellaneous  Salt substitute with physician's approval; pepper, herbs, spices; vinegar, lemon or lime juice; hot pepper sauce; garlic powder, onion powder, low sodium soy sauce (1 Tbs.); low sodium condiments (ketchup, chili sauce, mustard) in limited amounts (1 tsp.) fresh ground horseradish; unsalted  tortilla chips, pretzels, potato chips, popcorn, salsa (1/4 cup) Any seasoning made with salt including garlic salt, celery salt, onion salt, and seasoned salt; sea salt, rock salt, kosher salt; meat tenderizers; monosodium glutamate; mustard, regular soy sauce, barbecue, sauce, chili sauce, teriyaki sauce, steak sauce, Worcestershire sauce, and most flavored vinegars; canned gravy and mixes; regular condiments; salted snack foods, olives, picles, relish, horseradish sauce, catsup   Food preparation: Try these seasonings Meats:    Pork Sage, onion Serve with applesauce  Chicken Poultry seasoning, thyme, parsley Serve with cranberry sauce  Lamb Curry powder, rosemary, garlic, thyme Serve with mint sauce or jelly  Veal Marjoram, basil Serve with current jelly, cranberry sauce  Beef Pepper, bay leaf Serve with dry mustard, unsalted chive butter  Fish [place] leaf, dill Serve with unsalted lemon butter, unsalted parsley butter  Vegetables:    Asparagus Lemon juice   Broccoli Lemon juice   Carrots Mustard dressing parsley, mint, nutmeg, glazed with unsalted butter and sugar   Green beans Marjoram, lemon juice, nutmeg,dill seed   Tomatoes Basil, marjoram, onion   Spice /blend for [employer]" 4 tsp ground thyme 1 tsp ground sage  3 tsp ground rosemary 4 tsp ground marjoram   Test your knowledge 1. A product that says "Salt Free" may still contain sodium. True or False 2. Garlic Powder and Hot Pepper Sauce an be used as alternative seasonings.True or False 3. Processed foods have more sodium than fresh foods.  True or False 4. Canned Vegetables have less sodium than froze True or False  WAYS TO DECREASE YOUR SODIUM INTAKE 1. Avoid the use of added salt in cooking and at the table.  Table salt (and other prepared seasonings which contain salt) is probably one of the greatest sources of sodium in the diet.  Unsalted foods can gain flavor from the sweet, sour, and butter taste sensations of herbs and  spices.  Instead of using salt for seasoning, try the following seasonings with the foods listed.  Remember: how you use them to enhance natural food flavors is limited only by your creativity... Allspice-Meat, fish, eggs, fruit, peas, red and yellow vegetables Almond Extract-Fruit baked goods Anise Seed-Sweet breads, fruit, carrots, beets, cottage cheese, cookies (tastes like licorice) Basil-Meat, fish, eggs, vegetables, rice, vegetables salads, soups, sauces Bay Leaf-Meat, fish, stews, poultry Burnet-Salad, vegetables (cucumber-like flavor) Caraway Seed-Bread, cookies, cottage cheese, meat, vegetables, cheese, rice Cardamon-Baked goods, fruit, soups Celery Powder or seed-Salads, salad dressings, sauces, meatloaf, soup, bread.Do not use  celery salt Chervil-Meats, salads, fish, eggs, vegetables, cottage cheese (parsley-like flavor) Chili Power-Meatloaf, chicken cheese, corn, eggplant, egg dishes Chives-Salads cottage cheese, egg dishes, soups, vegetables, sauces Cilantro-Salsa, casseroles Cinnamon-Baked goods, fruit, pork, lamb, chicken, carrots Cloves-Fruit, baked goods, fish, pot roast, green beans, beets, carrots Coriander-Pastry, cookies, meat, salads, cheese (lemon-orange flavor) Cumin-Meatloaf, fish,cheese, eggs, cabbage,fruit pie (caraway flavor) [employer], fruit, eggs, fish, poultry, cottage cheese, vegetables Dill Seed-Meat, cottage cheese, poultry, vegetables, fish, salads, bread Fennel Seed-Bread, cookies, apples, pork, eggs, fish, beets, cabbage, cheese, Licorice-like flavor Garlic-(buds or powder) Salads, meat, poultry, fish, bread, butter, vegetables, potatoes.Do not  use garlic salt Ginger-Fruit, vegetables, baked goods, meat, fish, poultry Horseradish Root-Meet, vegetables, butter Lemon Juice or Extract-Vegetables, fruit, tea, baked goods, fish salads Mace-Baked goods fruit, vegetables, fish, poultry (taste like nutmeg) Maple Extract-Syrups Marjoram-Meat, chicken,  fish, vegetables, breads, green salads (taste like Sage) Mint-Tea, lamb, sherbet, vegetables, desserts, carrots, cabbage Mustard, Dry or Seed-Cheese, eggs, meats, vegetables, poultry Nutmeg-Baked goods, fruit, chicken, eggs, vegetables, desserts Onion Powder-Meat, fish, poultry, vegetables, cheese, eggs, bread, rice salads (Do not use   Onion salt) Orange Extract-Desserts, baked goods Oregano-Pasta, eggs, cheese, onions, pork, lamb, fish, chicken, vegetables, green salads Paprika-Meat, fish, poultry, eggs, cheese, vegetables Parsley Flakes-Butter, vegetables, meat fish, poultry, eggs, bread, salads (certain forms may   Contain sodium Pepper-Meat fish, poultry, vegetables, eggs Peppermint Extract-Desserts, baked goods Poppy Seed-Eggs, bread, cheese, fruit dressings, baked goods, noodles, vegetables, cottage  [employer], poultry, meat, fish, cauliflower, turnips,eggs bread Saffron-Rice, bread, veal, chicken, fish, eggs Sage-Meat, fish, poultry, onions, eggplant, tomateos, pork, stews Savory-Eggs, salads, poultry, meat, rice, vegetables, soups, pork Tarragon-Meat, poultry, fish, eggs, butter, vegetables (licorice-like flavor)  Thyme-Meat, poultry, fish, eggs, vegetables, (clover-like flavor), sauces, soups Tumeric-Salads, butter, eggs, fish, rice, vegetables (saffron-like flavor) Vanilla Extract-Baked goods, candy Vinegar-Salads, vegetables, meat marinades Walnut Extract-baked goods, candy  2. Choose your Foods Wisely   The following is a list of foods to avoid which are high in sodium:  Meats-Avoid all smoked, canned, salt cured, dried and kosher meat and fish as well as Anchovies   [employer] meats:Bologna, Liverwurst, Pastrami Canned meat or fish  Marinated  herring Caviar    Pepperoni Corned Beef   Pizza Dried chipped beef  Salami Frozen breaded fish or meat Salt pork Frankfurters or hot dogs  Sardines Gefilte  fish   Sausage Ham (boiled ham, Proscuitto Smoked butt    spiced ham)   Spam      TV Dinners Vegetables Canned vegetables (Regular) Relish Canned mushrooms  Sauerkraut Olives    Tomato juice Pickles  Bakery and Dessert Products Canned puddings  Cream pies Cheesecake   Decorated cakes Cookies  Beverages/Juices Tomato juice, regular  Gatorade   V-8 vegetable juice, regular  Breads and Cereals Biscuit mixes   Salted potato chips, corn chips, pretzels Bread stuffing mixes  Salted crackers and rolls Pancake and waffle mixes Self-rising flour  Seasonings Accent    Meat sauces Barbecue sauce  Meat tenderizer Catsup    Monosodium glutamate (MSG) Celery salt   Onion salt Chili sauce   Prepared mustard Garlic salt   Salt, seasoned salt, sea salt Gravy mixes   Soy sauce Horseradish   Steak sauce Ketchup   Tartar sauce Lite salt    Teriyaki sauce Marinade mixes   Worcestershire sauce  Others Baking powder   Cocoa and cocoa mixes Baking soda   Commercial casserole mixes Candy-caramels, chocolate  Dehydrated soups    Bars, fudge,nougats  Instant rice and pasta mixes Canned broth or soup  Maraschino cherries Cheese, aged and processed cheese and cheese spreads  Learning Assessment Quiz  Indicated T (for True) or F (for False) for each of the following statements:  1. _____ Fresh fruits and vegetables and unprocessed grains are generally low in sodium 2. _____ Water may contain a considerable amount of sodium, depending on the source 3. _____ You can always tell if a food is high in sodium by tasting it 4. _____ Certain laxatives my be high in sodium and should be avoided unless prescribed   by a physician or pharmacist 5. _____ Salt substitutes may be used freely by anyone on a sodium restricted diet 6. _____ Sodium is present in table salt, food additives and as a natural component of   most foods 7. _____ Table salt is approximately 90% sodium 8. _____ Limiting sodium  intake may help prevent excess fluid accumulation in the body 9. _____ On a sodium-restricted diet, seasonings such as bouillon soy sauce, and    cooking wine should be used in place of table salt 10. _____ On an ingredient list, a product which lists monosodium glutamate as the first   ingredient is an appropriate food to include on a low sodium diet  Circle the best answer(s) to the following statements (Hint: there may be more than one correct answer)  11. On a low-sodium diet, some acceptable snack items are:    A. Olives  F. Bean dip   K. Grapefruit juice    B. Salted Pretzels G. Commercial Popcorn   L. Canned peaches    C. Carrot Sticks  H. Bouillon   M. Unsalted nuts   D. [place] fries  I. Peanut butter crackers N. Salami   E. Sweet pickles J. Tomato Juice   O. Pizza  12.  Seasonings that may be used freely on a reduced - sodium diet include   A. Lemon wedges F.Monosodium glutamate K. Celery seed    B.Soysauce   G. Pepper   L. Mustard powder   C. Sea salt  H. Cooking wine  M. Onion flakes   D. Vinegar  E. Prepared  horseradish N. Salsa   E. Sage   J. Worcestershire sauce  O. Chutney      Signed, Ermalinda Barrios, PA-C  03/30/2021 2:16 PM    [facility] [address], [place], [place]  [address] Phone: [phone]; Fax: [phone]

## 2021-03-20 DIAGNOSIS — E1122 Type 2 diabetes mellitus with diabetic chronic kidney disease: Secondary | ICD-10-CM | POA: Diagnosis not present

## 2021-03-20 DIAGNOSIS — E785 Hyperlipidemia, unspecified: Secondary | ICD-10-CM | POA: Diagnosis not present

## 2021-03-20 DIAGNOSIS — N183 Chronic kidney disease, stage 3 unspecified: Secondary | ICD-10-CM | POA: Diagnosis not present

## 2021-03-20 DIAGNOSIS — I129 Hypertensive chronic kidney disease with stage 1 through stage 4 chronic kidney disease, or unspecified chronic kidney disease: Secondary | ICD-10-CM | POA: Diagnosis not present

## 2021-03-24 ENCOUNTER — Ambulatory Visit: Payer: Medicare Other | Admitting: Cardiology

## 2021-03-30 ENCOUNTER — Other Ambulatory Visit: Payer: Self-pay | Admitting: Physician Assistant

## 2021-03-30 ENCOUNTER — Ambulatory Visit (INDEPENDENT_AMBULATORY_CARE_PROVIDER_SITE_OTHER): Payer: Medicare Other

## 2021-03-30 ENCOUNTER — Other Ambulatory Visit: Payer: Self-pay

## 2021-03-30 ENCOUNTER — Ambulatory Visit (INDEPENDENT_AMBULATORY_CARE_PROVIDER_SITE_OTHER): Payer: Medicare Other | Admitting: Physician Assistant

## 2021-03-30 ENCOUNTER — Encounter: Payer: Self-pay | Admitting: Physician Assistant

## 2021-03-30 VITALS — BP 120/60 | HR 90 | Ht 67.0 in | Wt 222.2 lb

## 2021-03-30 DIAGNOSIS — E785 Hyperlipidemia, unspecified: Secondary | ICD-10-CM | POA: Diagnosis not present

## 2021-03-30 DIAGNOSIS — I10 Essential (primary) hypertension: Secondary | ICD-10-CM | POA: Diagnosis not present

## 2021-03-30 DIAGNOSIS — I480 Paroxysmal atrial fibrillation: Secondary | ICD-10-CM | POA: Diagnosis not present

## 2021-03-30 DIAGNOSIS — E11610 Type 2 diabetes mellitus with diabetic neuropathic arthropathy: Secondary | ICD-10-CM | POA: Diagnosis not present

## 2021-03-30 DIAGNOSIS — R002 Palpitations: Secondary | ICD-10-CM

## 2021-03-30 DIAGNOSIS — E11649 Type 2 diabetes mellitus with hypoglycemia without coma: Secondary | ICD-10-CM | POA: Diagnosis not present

## 2021-03-30 NOTE — Patient Instructions (Signed)
Medication Instructions:  Your physician recommends that you continue on your current medications as directed. Please refer to the Current Medication list given to you today.  *If you need a refill on your cardiac medications before your next appointment, please call your pharmacy*   Lab Work: NONE   If you have labs (blood work) drawn today and your tests are completely normal, you will receive your results only by: Marland Kitchen MyChart Message (if you have MyChart) OR . A paper copy in the mail If you have any lab test that is abnormal or we need to change your treatment, we will call you to review the results.   Testing/Procedures: [facility]n [facility]- Long Term Monitor Instructions   Your physician has requested you wear your ZIO patch monitor___14____days.   This is a single patch monitor.  [employer] supplies one patch monitor per enrollment.  Additional stickers are not available.   Please do not apply patch if you will be having a Nuclear Stress Test, Echocardiogram, Cardiac CT, MRI, or Chest Xray during the time frame you would be wearing the monitor. The patch cannot be worn during these tests.  You cannot remove and re-apply the ZIO XT patch monitor.   Your ZIO patch monitor will be sent USPS Priority mail from [facility] directly to your home address. The monitor may also be mailed to a PO BOX if home delivery is not available.   It may take 3-5 days to receive your monitor after you have been enrolled.   Once you have received you monitor, please review enclosed instructions.  Your monitor has already been registered assigning a specific monitor serial # to you.   Applying the monitor   Shave hair from upper left chest.   Hold abrader disc by orange tab.  Rub abrader in 40 strokes over left upper chest as indicated in your monitor instructions.   Clean area with 4 enclosed alcohol pads .  Use all pads to assure are is cleaned thoroughly.  Let dry.   Apply patch as indicated in monitor  instructions.  Patch will be place under collarbone on left side of chest with arrow pointing upward.   Rub patch adhesive wings for 2 minutes.Remove white label marked "1".  Remove white label marked "2".  Rub patch adhesive wings for 2 additional minutes.   While looking in a mirror, press and release button in center of patch.  A small green light will flash 3-4 times .  This will be your only indicator the monitor has been turned on.     Do not shower for the first 24 hours.  You may shower after the first 24 hours.   Press button if you feel a symptom. You will hear a small click.  Record Date, Time and Symptom in the Patient Log Book.   When you are ready to remove patch, follow instructions on last 2 pages of Patient Log Book.  Stick patch monitor onto last page of Patient Log Book.   Place Patient Log Book in [place] box.  Use locking tab on box and tape box closed securely.  The [employer] and [employer] has [employer] on it.  Please place in mailbox as soon as possible.  Your physician should have your test results approximately 7 days after the monitor has been mailed back to [facility].   Call [facility] at [phone] if you have questions regarding your ZIO XT patch monitor.  Call them immediately if you see  an orange light blinking on your monitor.   If your monitor falls off in less than 4 days contact our Monitor department at [phone].  If your monitor becomes loose or falls off after 4 days call [employer] at [phone] for suggestions on securing your monitor.   Follow-Up: At [facility], you and your health needs are our priority.  As part of our continuing mission to provide you with exceptional heart care, we have created designated Provider Care Teams.  These Care Teams include your primary Cardiologist (physician) and Advanced Practice Providers (APPs -  Physician Assistants and Nurse Practitioners) who all work together to provide you with the  care you need, when you need it.  We recommend signing up for the patient portal called "MyChart".  Sign up information is provided on this After Visit Summary.  MyChart is used to connect with patients for Virtual Visits (Telemedicine).  Patients are able to view lab/test results, encounter notes, upcoming appointments, etc.  Non-urgent messages can be sent to your provider as well.   To learn more about what you can do with MyChart, go to [email or web address].    Your next appointment:   6 month(s)  The format for your next appointment:   In Person  Provider:   Carlyle Dolly, MD   Other Instructions Thank you for choosing [facility]!  Two Gram Sodium Diet 2000 mg  What is Sodium? Sodium is a mineral found naturally in many foods. The most significant source of sodium in the diet is table salt, which is about 40% sodium.  Processed, convenience, and preserved foods also contain a large amount of sodium.  The body needs only 500 mg of sodium daily to function,  A normal diet provides more than enough sodium even if you do not use salt.  Why Limit Sodium? A build up of sodium in the body can cause thirst, increased blood pressure, shortness of breath, and water retention.  Decreasing sodium in the diet can reduce edema and risk of heart attack or stroke associated with high blood pressure.  Keep in mind that there are many other factors involved in these health problems.  Heredity, obesity, lack of exercise, cigarette smoking, stress and what you eat all play a role.  General Guidelines:  Do not add salt at the table or in cooking.  One teaspoon of salt contains over 2 grams of sodium.  Read food labels  Avoid processed and convenience foods  Ask your dietitian before eating any foods not dicussed in the menu planning guidelines  Consult your physician if you wish to use a salt substitute or a sodium containing medication such as antacids.  Limit milk and milk  products to 16 oz (2 cups) per day.  Shopping Hints:  READ LABELS!! "Dietetic" does not necessarily mean low sodium.  Salt and other sodium ingredients are often added to foods during processing.   Menu Planning Guidelines Food Group Choose More Often Avoid  Beverages (see also the milk group All fruit juices, low-sodium, salt-free vegetables juices, low-sodium carbonated beverages Regular vegetable or tomato juices, commercially softened water used for drinking or cooking  Breads and Cereals Enriched white, wheat, rye and pumpernickel bread, hard rolls and dinner rolls; muffins, cornbread and waffles; most dry cereals, cooked cereal without added salt; unsalted crackers and breadsticks; low sodium or homemade bread crumbs Bread, rolls and crackers with salted tops; quick breads; instant hot cereals; pancakes; commercial bread stuffing; self-rising flower and biscuit mixes; regular bread crumbs  or cracker crumbs  Desserts and Sweets Desserts and sweets mad with mild should be within allowance Instant pudding mixes and cake mixes  Fats Butter or margarine; vegetable oils; unsalted salad dressings, regular salad dressings limited to 1 Tbs; light, sour and heavy cream Regular salad dressings containing bacon fat, bacon bits, and salt pork; snack dips made with instant soup mixes or processed cheese; salted nuts  Fruits Most fresh, frozen and canned fruits Fruits processed with salt or sodium-containing ingredient (some dried fruits are processed with sodium sulfites        Vegetables Fresh, frozen vegetables and low- sodium canned vegetables Regular canned vegetables, sauerkraut, pickled vegetables, and others prepared in brine; frozen vegetables in sauces; vegetables seasoned with ham, bacon or salt pork  Condiments, Sauces, Miscellaneous  Salt substitute with physician's approval; pepper, herbs, spices; vinegar, lemon or lime juice; hot pepper sauce; garlic powder, onion powder, low sodium soy  sauce (1 Tbs.); low sodium condiments (ketchup, chili sauce, mustard) in limited amounts (1 tsp.) fresh ground horseradish; unsalted tortilla chips, pretzels, potato chips, popcorn, salsa (1/4 cup) Any seasoning made with salt including garlic salt, celery salt, onion salt, and seasoned salt; sea salt, rock salt, kosher salt; meat tenderizers; monosodium glutamate; mustard, regular soy sauce, barbecue, sauce, chili sauce, teriyaki sauce, steak sauce, Worcestershire sauce, and most flavored vinegars; canned gravy and mixes; regular condiments; salted snack foods, olives, picles, relish, horseradish sauce, catsup   Food preparation: Try these seasonings Meats:    Pork Sage, onion Serve with applesauce  Chicken Poultry seasoning, thyme, parsley Serve with cranberry sauce  Lamb Curry powder, rosemary, garlic, thyme Serve with mint sauce or jelly  Veal Marjoram, basil Serve with current jelly, cranberry sauce  Beef Pepper, bay leaf Serve with dry mustard, unsalted chive butter  Fish [place] leaf, dill Serve with unsalted lemon butter, unsalted parsley butter  Vegetables:    Asparagus Lemon juice   Broccoli Lemon juice   Carrots Mustard dressing parsley, mint, nutmeg, glazed with unsalted butter and sugar   Green beans Marjoram, lemon juice, nutmeg,dill seed   Tomatoes Basil, marjoram, onion   Spice /blend for [employer]" 4 tsp ground thyme 1 tsp ground sage 3 tsp ground rosemary 4 tsp ground marjoram   Test your knowledge 1. A product that says "Salt Free" may still contain sodium. True or False 2. Garlic Powder and Hot Pepper Sauce an be used as alternative seasonings.True or False 3. Processed foods have more sodium than fresh foods.  True or False 4. Canned Vegetables have less sodium than froze True or False  WAYS TO DECREASE YOUR SODIUM INTAKE 1. Avoid the use of added salt in cooking and at the table.  Table salt (and other prepared seasonings which contain salt) is probably one of the  greatest sources of sodium in the diet.  Unsalted foods can gain flavor from the sweet, sour, and butter taste sensations of herbs and spices.  Instead of using salt for seasoning, try the following seasonings with the foods listed.  Remember: how you use them to enhance natural food flavors is limited only by your creativity... Allspice-Meat, fish, eggs, fruit, peas, red and yellow vegetables Almond Extract-Fruit baked goods Anise Seed-Sweet breads, fruit, carrots, beets, cottage cheese, cookies (tastes like licorice) Basil-Meat, fish, eggs, vegetables, rice, vegetables salads, soups, sauces Bay Leaf-Meat, fish, stews, poultry Burnet-Salad, vegetables (cucumber-like flavor) Caraway Seed-Bread, cookies, cottage cheese, meat, vegetables, cheese, rice Cardamon-Baked goods, fruit, soups Celery Powder or seed-Salads, salad dressings, sauces, meatloaf,  soup, bread.Do not use  celery salt Chervil-Meats, salads, fish, eggs, vegetables, cottage cheese (parsley-like flavor) Chili Power-Meatloaf, chicken cheese, corn, eggplant, egg dishes Chives-Salads cottage cheese, egg dishes, soups, vegetables, sauces Cilantro-Salsa, casseroles Cinnamon-Baked goods, fruit, pork, lamb, chicken, carrots Cloves-Fruit, baked goods, fish, pot roast, green beans, beets, carrots Coriander-Pastry, cookies, meat, salads, cheese (lemon-orange flavor) Cumin-Meatloaf, fish,cheese, eggs, cabbage,fruit pie (caraway flavor) [employer], fruit, eggs, fish, poultry, cottage cheese, vegetables Dill Seed-Meat, cottage cheese, poultry, vegetables, fish, salads, bread Fennel Seed-Bread, cookies, apples, pork, eggs, fish, beets, cabbage, cheese, Licorice-like flavor Garlic-(buds or powder) Salads, meat, poultry, fish, bread, butter, vegetables, potatoes.Do not  use garlic salt Ginger-Fruit, vegetables, baked goods, meat, fish, poultry Horseradish Root-Meet, vegetables, butter Lemon Juice or Extract-Vegetables, fruit, tea, baked  goods, fish salads Mace-Baked goods fruit, vegetables, fish, poultry (taste like nutmeg) Maple Extract-Syrups Marjoram-Meat, chicken, fish, vegetables, breads, green salads (taste like Sage) Mint-Tea, lamb, sherbet, vegetables, desserts, carrots, cabbage Mustard, Dry or Seed-Cheese, eggs, meats, vegetables, poultry Nutmeg-Baked goods, fruit, chicken, eggs, vegetables, desserts Onion Powder-Meat, fish, poultry, vegetables, cheese, eggs, bread, rice salads (Do not use   Onion salt) Orange Extract-Desserts, baked goods Oregano-Pasta, eggs, cheese, onions, pork, lamb, fish, chicken, vegetables, green salads Paprika-Meat, fish, poultry, eggs, cheese, vegetables Parsley Flakes-Butter, vegetables, meat fish, poultry, eggs, bread, salads (certain forms may   Contain sodium Pepper-Meat fish, poultry, vegetables, eggs Peppermint Extract-Desserts, baked goods Poppy Seed-Eggs, bread, cheese, fruit dressings, baked goods, noodles, vegetables, cottage  [employer], poultry, meat, fish, cauliflower, turnips,eggs bread Saffron-Rice, bread, veal, chicken, fish, eggs Sage-Meat, fish, poultry, onions, eggplant, tomateos, pork, stews Savory-Eggs, salads, poultry, meat, rice, vegetables, soups, pork Tarragon-Meat, poultry, fish, eggs, butter, vegetables (licorice-like flavor)  Thyme-Meat, poultry, fish, eggs, vegetables, (clover-like flavor), sauces, soups Tumeric-Salads, butter, eggs, fish, rice, vegetables (saffron-like flavor) Vanilla Extract-Baked goods, candy Vinegar-Salads, vegetables, meat marinades Walnut Extract-baked goods, candy  2. Choose your Foods Wisely   The following is a list of foods to avoid which are high in sodium:  Meats-Avoid all smoked, canned, salt cured, dried and kosher meat and fish as well as Anchovies   [employer] meats:Bologna, Liverwurst, Pastrami Canned meat or fish  Marinated herring Caviar    Pepperoni Corned  Beef   Pizza Dried chipped beef  Salami Frozen breaded fish or meat Salt pork Frankfurters or hot dogs  Sardines Gefilte fish   Sausage Ham (boiled ham, Proscuitto Smoked butt    spiced ham)   Spam      TV Dinners Vegetables Canned vegetables (Regular) Relish Canned mushrooms  Sauerkraut Olives    Tomato juice Pickles  Bakery and Dessert Products Canned puddings  Cream pies Cheesecake   Decorated cakes Cookies  Beverages/Juices Tomato juice, regular  Gatorade   V-8 vegetable juice, regular  Breads and Cereals Biscuit mixes   Salted potato chips, corn chips, pretzels Bread stuffing mixes  Salted crackers and rolls Pancake and waffle mixes Self-rising flour  Seasonings Accent    Meat sauces Barbecue sauce  Meat tenderizer Catsup    Monosodium glutamate (MSG) Celery salt   Onion salt Chili sauce   Prepared mustard Garlic salt   Salt, seasoned salt, sea salt Gravy mixes   Soy sauce Horseradish   Steak sauce Ketchup   Tartar sauce Lite salt    Teriyaki sauce Marinade mixes   Worcestershire sauce  Others Baking powder   Cocoa and cocoa mixes Baking soda   Commercial casserole mixes Candy-caramels, chocolate  Dehydrated soups  Bars, fudge,nougats  Instant rice and pasta mixes Canned broth or soup  Maraschino cherries Cheese, aged and processed cheese and cheese spreads  Learning Assessment Quiz  Indicated T (for True) or F (for False) for each of the following statements:  1. _____ Fresh fruits and vegetables and unprocessed grains are generally low in sodium 2. _____ Water may contain a considerable amount of sodium, depending on the source 3. _____ You can always tell if a food is high in sodium by tasting it 4. _____ Certain laxatives my be high in sodium and should be avoided unless prescribed   by a physician or pharmacist 5. _____ Salt substitutes may be used freely by anyone on a sodium restricted diet 6. _____ Sodium is present in table salt, food  additives and as a natural component of   most foods 7. _____ Table salt is approximately 90% sodium 8. _____ Limiting sodium intake may help prevent excess fluid accumulation in the body 9. _____ On a sodium-restricted diet, seasonings such as bouillon soy sauce, and    cooking wine should be used in place of table salt 10. _____ On an ingredient list, a product which lists monosodium glutamate as the first   ingredient is an appropriate food to include on a low sodium diet  Circle the best answer(s) to the following statements (Hint: there may be more than one correct answer)  11. On a low-sodium diet, some acceptable snack items are:    A. Olives  F. Bean dip   K. Grapefruit juice    B. Salted Pretzels G. Commercial Popcorn   L. Canned peaches    C. Carrot Sticks  H. Bouillon   M. Unsalted nuts   D. [place] fries  I. Peanut butter crackers N. Salami   E. Sweet pickles J. Tomato Juice   O. Pizza  12.  Seasonings that may be used freely on a reduced - sodium diet include   A. Lemon wedges F.Monosodium glutamate K. Celery seed    B.Soysauce   G. Pepper   L. Mustard powder   C. Sea salt  H. Cooking wine  M. Onion flakes   D. Vinegar  E. Prepared horseradish N. Salsa   E. Sage   J. Worcestershire sauce  O. Chutney

## 2021-04-20 DIAGNOSIS — E1122 Type 2 diabetes mellitus with diabetic chronic kidney disease: Secondary | ICD-10-CM | POA: Diagnosis not present

## 2021-04-20 DIAGNOSIS — E785 Hyperlipidemia, unspecified: Secondary | ICD-10-CM | POA: Diagnosis not present

## 2021-04-20 DIAGNOSIS — I129 Hypertensive chronic kidney disease with stage 1 through stage 4 chronic kidney disease, or unspecified chronic kidney disease: Secondary | ICD-10-CM | POA: Diagnosis not present

## 2021-04-20 DIAGNOSIS — N183 Chronic kidney disease, stage 3 unspecified: Secondary | ICD-10-CM | POA: Diagnosis not present

## 2021-04-21 DIAGNOSIS — R002 Palpitations: Secondary | ICD-10-CM | POA: Diagnosis not present

## 2021-04-29 ENCOUNTER — Other Ambulatory Visit: Payer: Self-pay

## 2021-04-29 ENCOUNTER — Encounter: Payer: Self-pay | Admitting: Sports Medicine

## 2021-04-29 ENCOUNTER — Ambulatory Visit (INDEPENDENT_AMBULATORY_CARE_PROVIDER_SITE_OTHER): Payer: Medicare Other | Admitting: Sports Medicine

## 2021-04-29 DIAGNOSIS — E11610 Type 2 diabetes mellitus with diabetic neuropathic arthropathy: Secondary | ICD-10-CM

## 2021-04-29 DIAGNOSIS — M79675 Pain in left toe(s): Secondary | ICD-10-CM | POA: Diagnosis not present

## 2021-04-29 DIAGNOSIS — M79674 Pain in right toe(s): Secondary | ICD-10-CM | POA: Diagnosis not present

## 2021-04-29 DIAGNOSIS — E1142 Type 2 diabetes mellitus with diabetic polyneuropathy: Secondary | ICD-10-CM | POA: Diagnosis not present

## 2021-04-29 DIAGNOSIS — E1165 Type 2 diabetes mellitus with hyperglycemia: Secondary | ICD-10-CM

## 2021-04-29 DIAGNOSIS — B351 Tinea unguium: Secondary | ICD-10-CM

## 2021-04-29 NOTE — Patient Instructions (Signed)
Aspercreme or voltaren topical as needed for pain on bottom of left foot

## 2021-04-29 NOTE — Progress Notes (Signed)
Subjective: Brittney [name] is a 75 y.o. female patient with history of diabetes who presents to office today complaining of long,mildly painful nails.  Reports that she has not been here in a while because she was going to another podiatrist who was taking care of her feet but has decided that she wants to come back here.  Patient reports that her blood sugars have been up and down and that when she was seeing the other podiatrist she was diagnosed with Charcot deformity on the left she gets some occasional sharp shooting pains left greater than right foot but denies any other pedal complaints at this time.   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 100 each 5   B-D ULTRAFINE III SHORT PEN 31G X 8 MM MISC USE AS DIRECTED 2-3 TIMES A DAY. 100 each 11   benzonatate (TESSALON) 100 MG capsule Take 100 mg by mouth every 8 (eight) hours.     Blood Glucose Monitoring Suppl (ACCU-CHEK GUIDE ME) w/Device KIT Use to check blood sugar 2 times per day dx code E11.65 1 kit 0   BYETTA 10 MCG PEN 10 MCG/0.04ML SOPN injection INJECT 10MCG INTO THESKIN TWO TIMES DAILY WITH A MEAL. INJECT 30 MINUTES PRIOR TO EATING. 2.4 mL 2   cloNIDine (CATAPRES) 0.1 MG tablet TAKE (1) TABLET BY MOUTH TWICE DAILY. 180 tablet 1   Continuous Blood Gluc Receiver (FREESTYLE LIBRE 2 READER) DEVI Use to  monitor blood sugar 1 each 0   gabapentin (NEURONTIN) 100 MG capsule Take 3 capsules (300 mg total) by mouth at bedtime. (Patient taking differently: Take 100 mg by mouth at bedtime.) 90 capsule 0   HUMALOG KWIKPEN 100 UNIT/ML KwikPen INJECT 6 TO 7 UNITS SUBCUTANEOUSLY BEFORE SUPPER IF GLUCOSE IS HIGH. 15 mL 3   hydrochlorothiazide (HYDRODIURIL) 25 MG tablet Take 1 tablet (25 mg total) by mouth daily. 90 tablet 3   Lancets (ONETOUCH ULTRASOFT) lancets      LANTUS SOLOSTAR 100 UNIT/ML Solostar Pen INJECT 40 UNITS SUBCUTANEOUSLY ONCE EVERY MORNING. 15 mL 5   metFORMIN (GLUCOPHAGE) 850 MG tablet TAKE (1) TABLET BY MOUTH TWICE DAILY. 60 tablet 3   metoprolol tartrate (LOPRESSOR) 50 MG tablet TAKE 1/2 TABLET BY MOUTH TWICE DAILY. 30 tablet 6   Multiple Vitamin (MULTIVITAMIN) tablet Take 1 tablet by mouth once a week.      omeprazole (PRILOSEC) 20 MG capsule Take 20 mg by mouth daily.     polyethylene glycol powder (GLYCOLAX/MIRALAX) powder Take 1 Container by mouth as needed for moderate constipation.      SURE COMFORT INSULIN SYRINGE 31G X 5/16" 0.3 ML MISC USE AS DIRECTED 2-3 TIMES A DAY. 100 each 0   No current facility-administered medications on file prior to visit.   Allergies  Allergen Reactions   Latex     Itching and breaking out   Pravastatin     Pt stated, "  Made my muscle ache"    No results found for this or any previous visit (from the past 2160 hour(s)).   Objective: General: Patient is awake, alert, and oriented x 3 and in no acute distress.  Integument: Skin is warm, dry and supple bilateral. Nails are tender, long, thickened and dystrophic with subungual debris, consistent with onychomycosis, 1-5 bilateral with small fragments of nails from previous removal. No signs of infection, no callusing.  Remaining integument unremarkable.  Neurovascular status: Subjective sharp shooting and tingling to the left greater than right foot likely consistent with neuropathy.  Pedal pulses  1 out of 4 bilateral.  Musculoskeletal: Asymptomatic Charcot on the left with hammertoe and pes planus pedal deformities noted bilateral. + Charcot on left.  Muscular strength 4/5 on left and 5/5 on right in all lower extremity muscular groups bilateral without pain on range of motion . No tenderness with calf compression bilateral.  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 -Re-Discussed and educated patient on diabetic foot care, and to avoid ill fitting shoes -Mechanically debrided all nails 1-5 bilateral using sterile nail nipper and filed with dremel without incident  -Recommend over-the-counter topicals as needed for pain -Continue with diabetic shoes  -Answered all patient questions -Advised patient that if she wants to start her care here again she can have her previous records from her other foot doctor sent back to our office for review -Patient to return  in 3 months for at risk foot care or sooner if problems arise  Landis Martins, DPM

## 2021-05-17 ENCOUNTER — Telehealth: Payer: Self-pay

## 2021-05-17 NOTE — Telephone Encounter (Signed)
-----   Message from Imogene Burn, PA-C sent at 05/17/2021  9:54 AM EDT ----- Patient had rare SVT and NSVT. If she is still having palpitations increase metoprolol 50 mg one twice daily. Keep track of BP and pulse. thanks

## 2021-05-17 NOTE — Telephone Encounter (Signed)
I spoke with patient. I gave her monitor results. She reports on episode of palpitations. She is currently taking lopressor 25 mg BID. She will continue to watch BP/HR at home and call back if palpitations increase. I will copy results to pcp.

## 2021-05-18 DIAGNOSIS — N183 Chronic kidney disease, stage 3 unspecified: Secondary | ICD-10-CM | POA: Diagnosis not present

## 2021-05-18 DIAGNOSIS — E1142 Type 2 diabetes mellitus with diabetic polyneuropathy: Secondary | ICD-10-CM | POA: Diagnosis not present

## 2021-05-18 DIAGNOSIS — N1831 Chronic kidney disease, stage 3a: Secondary | ICD-10-CM | POA: Diagnosis not present

## 2021-05-18 DIAGNOSIS — E1122 Type 2 diabetes mellitus with diabetic chronic kidney disease: Secondary | ICD-10-CM | POA: Diagnosis not present

## 2021-05-18 DIAGNOSIS — I10 Essential (primary) hypertension: Secondary | ICD-10-CM | POA: Diagnosis not present

## 2021-05-20 DIAGNOSIS — I129 Hypertensive chronic kidney disease with stage 1 through stage 4 chronic kidney disease, or unspecified chronic kidney disease: Secondary | ICD-10-CM | POA: Diagnosis not present

## 2021-05-20 DIAGNOSIS — E785 Hyperlipidemia, unspecified: Secondary | ICD-10-CM | POA: Diagnosis not present

## 2021-05-20 DIAGNOSIS — E1122 Type 2 diabetes mellitus with diabetic chronic kidney disease: Secondary | ICD-10-CM | POA: Diagnosis not present

## 2021-05-20 DIAGNOSIS — N183 Chronic kidney disease, stage 3 unspecified: Secondary | ICD-10-CM | POA: Diagnosis not present

## 2021-05-25 ENCOUNTER — Ambulatory Visit: Payer: Medicare Other | Admitting: Endocrinology

## 2021-05-26 ENCOUNTER — Other Ambulatory Visit: Payer: Self-pay | Admitting: Endocrinology

## 2021-06-02 DIAGNOSIS — Z794 Long term (current) use of insulin: Secondary | ICD-10-CM | POA: Diagnosis not present

## 2021-06-02 DIAGNOSIS — E119 Type 2 diabetes mellitus without complications: Secondary | ICD-10-CM | POA: Diagnosis not present

## 2021-06-08 ENCOUNTER — Ambulatory Visit: Payer: Medicare Other | Admitting: Endocrinology

## 2021-06-17 ENCOUNTER — Encounter: Payer: Self-pay | Admitting: Endocrinology

## 2021-06-17 ENCOUNTER — Other Ambulatory Visit: Payer: Self-pay

## 2021-06-17 ENCOUNTER — Ambulatory Visit (INDEPENDENT_AMBULATORY_CARE_PROVIDER_SITE_OTHER): Payer: Medicare Other | Admitting: Endocrinology

## 2021-06-17 VITALS — BP 130/72 | HR 79 | Ht 67.0 in | Wt 225.4 lb

## 2021-06-17 DIAGNOSIS — E1165 Type 2 diabetes mellitus with hyperglycemia: Secondary | ICD-10-CM

## 2021-06-17 DIAGNOSIS — I10 Essential (primary) hypertension: Secondary | ICD-10-CM | POA: Diagnosis not present

## 2021-06-17 DIAGNOSIS — Z794 Long term (current) use of insulin: Secondary | ICD-10-CM

## 2021-06-17 DIAGNOSIS — E0590 Thyrotoxicosis, unspecified without thyrotoxic crisis or storm: Secondary | ICD-10-CM | POA: Diagnosis not present

## 2021-06-17 LAB — URINALYSIS, ROUTINE W REFLEX MICROSCOPIC
Bilirubin Urine: NEGATIVE
Hgb urine dipstick: NEGATIVE
Ketones, ur: NEGATIVE
Nitrite: NEGATIVE
RBC / HPF: NONE SEEN (ref 0–?)
Specific Gravity, Urine: 1.015 (ref 1.000–1.030)
Total Protein, Urine: NEGATIVE
Urine Glucose: NEGATIVE
Urobilinogen, UA: 0.2 (ref 0.0–1.0)
pH: 6 (ref 5.0–8.0)

## 2021-06-17 LAB — MICROALBUMIN / CREATININE URINE RATIO
Creatinine,U: 91.3 mg/dL
Microalb Creat Ratio: 0.8 mg/g (ref 0.0–30.0)
Microalb, Ur: <0.7 mg/dL (ref 0.0–1.9)

## 2021-06-17 LAB — TSH: TSH: 0 u[IU]/mL — ABNORMAL LOW (ref 0.35–5.50)

## 2021-06-17 LAB — T3, FREE: T3, Free: 3 pg/mL (ref 2.3–4.2)

## 2021-06-17 LAB — GLUCOSE, RANDOM: Glucose, Bld: 150 mg/dL — ABNORMAL HIGH (ref 70–99)

## 2021-06-17 LAB — T4, FREE: Free T4: 0.92 ng/dL (ref 0.60–1.60)

## 2021-06-17 NOTE — Progress Notes (Signed)
Patient ID: Brittney [name], female   DOB: 05/12/1946, 75 y.o.   MRN: [id number]     Reason for Appointment: Follow-up visit  History of Present Illness    Type 2 DIABETES MELITUS, date of diagnosis: 1976      She has had long-standing diabetes taking insulin. Because of her difficulties with compliance using mealtime insulin she was given in Byetta in 2011 and subsequently Victoza in addition to her Lantus and this helped her control However she tends to be erratic with her day-to-day care and has not had adequate A1c levels in the past, usually over 7%  RECENT history:   Insulin regimen: Lantus 40 units daily in a.m. Humalog 4 units occasionally at dinner  Non-insulin hypoglycemic drugs:  was on Byetta 10 mcg before meals, taking metformin  850 twice a day   Her A1c has recently gone up to 8.9 done by PCP, previously 7.6   Current glucose patterns and problems: She again has stopped Byetta for some time because she thinks it was making her neck swell  She has not been seen in follow-up since 11/21  Only the last 10 days she has used the Dexcom sensor which she was able to start with the help of her family member and using her phone app  However despite saying that her postprandial blood sugars are frequently high she is not taking Humalog consistently  Likely has variable carbohydrate intake as blood sugars are not consistently higher after meals  As discussed below blood sugars are mostly high late evening averaging as much is 197  Has only occasional mild hypoglycemia early morning  However she did not reduce her LANTUS to 40 units as previously instructed  Only recently has started doing a little walking Weight is slightly better  Side effects from medications:  Invokana: ? Headache.  Victoza ?  Swelling in neck      INTERPRETATION of CGM download from Dexcom sensor as follows  Blood sugars are above the target range on an average between noon and about midnight  with significant variability in the late afternoons and early evenings OVERNIGHT blood sugars are improved with declining average readings after midnight and lowest average reading down to 118 around 6 AM Blood sugars are mostly in the hyperglycemic range early afternoon and late afternoon/evening with more persistently high readings after 9 PM Hypoglycemia has been minimal with only occasional mild low sugars around 3 or 6 AM POSTPRANDIAL readings are variably high after meals and more consistently in week #1 after lunch Fasting readings around 7-8 AM are about 128 average  CGM use % of time   2-week average/GV 167/56  Time in range      62%  % Time Above 180 28+9  % Time above 250   % Time Below 70 0      Previous blood sugars:   PRE-MEAL Fasting Lunch Dinner Bedtime Overall  Glucose range:  57-157   138-370  142, 126   Mean/median:  125   190  147        Meals: 2-3 meals per day. . Dinner 6-7 pm, variable quantity and carbohydrates  Does have protein with breakfast consistently, has egg for protein   Wt Readings from Last 3 Encounters:  06/17/21 225 lb 6.4 oz (102.2 kg)  03/30/21 222 lb 3.2 oz (100.8 kg)  10/07/20 228 lb 9.6 oz (103.7 kg)   GLYCEMIC CONTROL:  Lab Results  Component Value Date   HGBA1C 7.6 (  A) 08/03/2020   HGBA1C 8.2 (A) 03/30/2020   HGBA1C 8.1 (A) 12/30/2019   Lab Results  Component Value Date   MICROALBUR 1.6 08/03/2020   LDLCALC 83 10/07/2020   CREATININE 0.92 10/22/2020   Lab Results  Component Value Date   FRUCTOSAMINE 345 (H) 03/30/2020   FRUCTOSAMINE 342 (H) 09/08/2017      Other problems discussed today: See review of systems   Allergies as of 06/17/2021       Reactions   Latex    Itching and breaking out   Pravastatin    Pt stated, "Made my muscle ache"        Medication List        Accurate as of June 17, 2021 10:48 AM. If you have any questions, ask your nurse or doctor.          Accu-Chek Guide Me w/Device  Kit Use to check blood sugar 2 times per day dx code E11.65   Accu-Chek Guide test strip Generic drug: glucose blood USE TO TEST BLOOD SUGAR THREE TIMES DAILY AS DIRECTED.   B-D ULTRAFINE III SHORT PEN 31G X 8 MM Misc Generic drug: Insulin Pen Needle USE AS DIRECTED 2-3 TIMES A DAY.   benzonatate 100 MG capsule Commonly known as: TESSALON Take 100 mg by mouth every 8 (eight) hours.   Byetta 10 MCG Pen 10 MCG/0.04ML Sopn injection Generic drug: exenatide INJECT 10MCG INTO THESKIN TWO TIMES DAILY WITH A MEAL. INJECT 30 MINUTES PRIOR TO EATING.   cloNIDine 0.1 MG tablet Commonly known as: CATAPRES TAKE (1) TABLET BY MOUTH TWICE DAILY.   FreeStyle Libre 2 Reader [employer] Use to monitor blood sugar   gabapentin 100 MG capsule Commonly known as: NEURONTIN Take 3 capsules (300 mg total) by mouth at bedtime. What changed: how much to take   HumaLOG KwikPen 100 UNIT/ML KwikPen Generic drug: insulin lispro INJECT 6 TO 7 UNITS SUBCUTANEOUSLY BEFORE SUPPER IF GLUCOSE IS HIGH.   hydrochlorothiazide 25 MG tablet Commonly known as: HYDRODIURIL Take 1 tablet (25 mg total) by mouth daily.   Lantus SoloStar 100 UNIT/ML Solostar Pen Generic drug: insulin glargine INJECT 40 UNITS SUBCUTANEOUSLY ONCE EVERY MORNING.   metFORMIN 850 MG tablet Commonly known as: GLUCOPHAGE TAKE (1) TABLET BY MOUTH TWICE DAILY.   metoprolol tartrate 50 MG tablet Commonly known as: LOPRESSOR TAKE 1/2 TABLET BY MOUTH TWICE DAILY.   multivitamin tablet Take 1 tablet by mouth once a week.   omeprazole 20 MG capsule Commonly known as: PRILOSEC Take 20 mg by mouth daily.   onetouch ultrasoft lancets   polyethylene glycol powder 17 GM/SCOOP powder Commonly known as: GLYCOLAX/MIRALAX Take 1 Container by mouth as needed for moderate constipation.   Sure Comfort Insulin Syringe 31G X 5/16" 0.3 ML Misc Generic drug: Insulin Syringe-Needle U-100 USE AS DIRECTED 2-3 TIMES A DAY.        Allergies:   Allergies  Allergen Reactions   Latex     Itching and breaking out   Pravastatin     Pt stated, "Made my muscle ache"    Past Medical History:  Diagnosis Date   Atrial fibrillation (HCC)    Paroxysmal; LVH; nl EF; onset in 1999   Burning with urination 02/23/2016   Diabetes mellitus    insulin; managed by Dr. 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   Hyperlipidemia    Hypertension    Hypothyroidism    history of goiter; nl TSH  off medication   Itching with irritation 02/27/2015   Obesity 07/06/2012   Palpitations    negative event recorder in 2009   UTI (urinary tract infection) 02/23/2016    Past Surgical History:  Procedure Laterality Date   BIOPSY THYROID     CESAREAN SECTION     CHOLECYSTECTOMY  11/95   COLONOSCOPY  2007   RETINAL DETACHMENT SURGERY  2009   TUBAL LIGATION  1980's    Family History  Problem Relation Age of Onset   Diabetes Mother    Diabetes Daughter        gestational diabetes   Cancer Father    Thyroid disease Brother    Other Son        MVA    Social History:  reports that she quit smoking about 34 years ago. Her smoking use included cigarettes. She started smoking about 54 years ago. She smoked an average of 1 pack per day. She has never used smokeless tobacco. She reports that she does not drink alcohol and does not use drugs.  Review of Systems:   HYPERTENSION:  Previously followed by PCP, had been on Norvasc, MAXZIDE and Benicar  Because of high potassium and increased creatinine her Benicar and Maxzide had been stopped She is on the clonidine 0.1 mg bid along with HCTZ 12.5 mg daily, also on 50 mg Toprol Previously on the clonidine patch she had some itching at the site  Also checking at home periodically  BP Readings from Last 3 Encounters:  06/17/21 130/72  03/30/21 120/60  10/07/20 138/78     HYPERLIPIDEMIA: The lipid abnormality consists of elevated LDL; she does not take her pravastatin  daily because of symptoms of muscle aches and cramping   Lab Results  Component Value Date   CHOL 164 10/07/2020   HDL 62.20 10/07/2020   LDLCALC 83 10/07/2020   TRIG 92.0 10/07/2020   CHOLHDL 3 10/07/2020    MULTINODULAR GOITER: She has had a long-standing multinodular goiter since at least 2003  She does have history of occasional palpitations for years Metoprolol has been prescribed by cardiologist  She has no increased local pressure symptoms in the neck or swallowing difficulty However she thinks her neck is periodically swelling more She is reluctant to consider surgery  Her goiter has been autonomous with no evidence of overt hyperthyroidism  with normal free T4 and free T3 levels; TSH has been consistently suppressed  Her I-131 uptake in 2003 was 17% with patchy uptake  her  thyroid ultrasound in 2014 did not show distinct nodules but a relative increase in size  Lab Results  Component Value Date   TSH <0.01 (L) 08/03/2020   TSH <0.01 (L) 12/30/2019   FREET4 1.07 10/07/2020   FREET4 1.03 08/03/2020   Lab Results  Component Value Date   T3FREE 3.7 10/07/2020   T3FREE 3.9 08/03/2020   T3FREE 3.4 12/30/2019       Foot exam done 12/30/2019 She has some numbness in her toes which is mild and chronic  This is better with Aspercreme given by podiatrist  She has been using diabetic shoes  She is periodically seen by podiatrist   Examination:   BP 130/72   Pulse 79   Ht 5' 7"  (1.702 m)   Wt 225 lb 6.4 oz (102.2 kg)   SpO2 99%   BMI 35.30 kg/m   Body mass index is 35.3 kg/m.      ASSESSMENT/ PLAN:  Diabetes Type 2 with obesity  on insulin, Byetta and metformin  See history of present illness for detailed discussion of  current management, blood sugar patterns and problems identified  Her A1c is last 8.9  As before she has significant postprandial hyperglycemia at times Not clear why her A1c has been higher and may be from not taking Byetta  She does  have occasional postprandial readings over 300 when she likely has more carbohydrate intake or larger meals Only recently has started using the Dexcom and is able to get a better idea of her sugars Her Dexcom download was discussed in detail and showed her the periods of high blood sugars  Recommendations:  She was reminded to take Humalog with any meal that has carbohydrate including lunch She can take 4 to 6 units based on meal size but before starting to eat She will start back on Byetta with 5 mcg twice daily 30 minutes before eating Reduce Lantus to 40 but when she is about to finish her dose she will be switched to [place] for more consistent 24 patterns and avoiding low sugars overnight More regular follow-up She was helped with starting her sensor on the Dexcom which was running out today  HYPERTENSION: Blood pressure is monitored by PCP, fairly well controlled  Multinodular goiter: Although she has a large goiter this is mostly anterior and not causing compressive symptoms  She is not willing to consider surgery  Will consider repeat scan if she has subclinical hyperthyroidism  Need for follow-up labs today Follow-up urine microalbumin today    There are no Patient Instructions on file for this visit.    Elayne Snare 06/17/2021, 10:48 AM

## 2021-06-17 NOTE — Addendum Note (Signed)
Addended by: Kaylyn Lim I on: 06/17/2021 01:18 PM   Modules accepted: Orders

## 2021-06-17 NOTE — Patient Instructions (Addendum)
Lantus 40 daily, call when out  4-6 Novolog at any starchy meal  Byetta 5 2x daily

## 2021-06-18 NOTE — Progress Notes (Signed)
Please call to let patient know that the lab results are stable, thyroid is not overactive and no further action needed

## 2021-06-21 ENCOUNTER — Other Ambulatory Visit: Payer: Self-pay

## 2021-06-21 DIAGNOSIS — E1165 Type 2 diabetes mellitus with hyperglycemia: Secondary | ICD-10-CM

## 2021-06-21 DIAGNOSIS — Z794 Long term (current) use of insulin: Secondary | ICD-10-CM

## 2021-06-21 MED ORDER — BYETTA 5 MCG PEN 5 MCG/0.02ML ~~LOC~~ SOPN: 5.0000 ug | PEN_INJECTOR | Freq: Two times a day (BID) | SUBCUTANEOUS | 3 refills | Status: DC

## 2021-06-29 ENCOUNTER — Encounter (INDEPENDENT_AMBULATORY_CARE_PROVIDER_SITE_OTHER): Payer: Medicare Other | Admitting: Ophthalmology

## 2021-07-09 ENCOUNTER — Telehealth: Payer: Self-pay | Admitting: Endocrinology

## 2021-07-09 NOTE — Telephone Encounter (Signed)
Patient called to let Dr Dwyane Dee know that she has taken the last of her Lantus and needs the medication sent o her pharmacy that he was going to switch her to at her last appointment. Patient did not know the name of the new medication.

## 2021-07-13 ENCOUNTER — Other Ambulatory Visit: Payer: Self-pay | Admitting: *Deleted

## 2021-07-13 DIAGNOSIS — E1165 Type 2 diabetes mellitus with hyperglycemia: Secondary | ICD-10-CM

## 2021-07-13 DIAGNOSIS — Z794 Long term (current) use of insulin: Secondary | ICD-10-CM

## 2021-07-13 MED ORDER — TRESIBA FLEXTOUCH 100 UNIT/ML ~~LOC~~ SOPN: PEN_INJECTOR | SUBCUTANEOUS | 5 refills | Status: DC

## 2021-07-13 NOTE — Telephone Encounter (Signed)
Sent insulin degludec (TRESIBA FLEXTOUCH) 100 UNIT/ML FlexTouch Pen to the pharmacy

## 2021-07-13 NOTE — Telephone Encounter (Signed)
Called pt back and notified Dr. Dwyane Dee switching lantus to [place] by last OV. Pt requesting to Rx tresiba to the [employer].

## 2021-07-15 ENCOUNTER — Telehealth: Payer: Self-pay | Admitting: Endocrinology

## 2021-07-15 DIAGNOSIS — E1165 Type 2 diabetes mellitus with hyperglycemia: Secondary | ICD-10-CM

## 2021-07-15 MED ORDER — BD PEN NEEDLE SHORT U/F 31G X 8 MM MISC: 4 refills | Status: DC

## 2021-07-15 NOTE — Telephone Encounter (Signed)
Called patient no answer. Left vm to call me back. Pen needles were sent in.

## 2021-07-15 NOTE — Telephone Encounter (Signed)
Patient requests to be called at ph# [phone] re: Patient has questions about her medications.  Also, Patient requests the following RX:  MEDICATION: PEN NEEDLES for Tyler Aas Flextouch Pen  PHARMACY:    [facility], [place] Phone:  [phone]  Fax:  [phone]     HAS THE PATIENT CONTACTED [facility]?  New RX  IS THIS A 90 DAY SUPPLY : Yes  IS PATIENT OUT OF MEDICATION: Yes  IF NOT; HOW MUCH IS LEFT: 0  LAST APPOINTMENT DATE: '@7'$ /28/2022  NEXT APPOINTMENT DATE:'@9'$ /27/2022  DO WE HAVE YOUR PERMISSION TO LEAVE A DETAILED MESSAGE?:Yes  OTHER COMMENTS:    **Let patient know to contact pharmacy at the end of the day to make sure medication is ready. **  ** Please notify patient to allow 48-72 hours to process**  **Encourage patient to contact the pharmacy for refills or they can request refills through [facility]**

## 2021-07-19 NOTE — Telephone Encounter (Signed)
Notified pt regarding medication and instructions by Dr.Kumar

## 2021-07-19 NOTE — Telephone Encounter (Signed)
Patient returned Brittney [name]'s call and requests to be called at ph# [phone] r

## 2021-07-19 NOTE — Telephone Encounter (Signed)
Notified pt the needles sent to the pharmacy. Pt have questions --since taking the Tyler Aas does the pt still need to take Byetta? Please advise

## 2021-07-21 DIAGNOSIS — I129 Hypertensive chronic kidney disease with stage 1 through stage 4 chronic kidney disease, or unspecified chronic kidney disease: Secondary | ICD-10-CM | POA: Diagnosis not present

## 2021-07-21 DIAGNOSIS — N183 Chronic kidney disease, stage 3 unspecified: Secondary | ICD-10-CM | POA: Diagnosis not present

## 2021-07-21 DIAGNOSIS — E785 Hyperlipidemia, unspecified: Secondary | ICD-10-CM | POA: Diagnosis not present

## 2021-07-21 DIAGNOSIS — E1122 Type 2 diabetes mellitus with diabetic chronic kidney disease: Secondary | ICD-10-CM | POA: Diagnosis not present

## 2021-07-23 ENCOUNTER — Other Ambulatory Visit: Payer: Self-pay | Admitting: Endocrinology

## 2021-08-02 ENCOUNTER — Encounter (INDEPENDENT_AMBULATORY_CARE_PROVIDER_SITE_OTHER): Payer: Medicare Other | Admitting: Ophthalmology

## 2021-08-02 ENCOUNTER — Other Ambulatory Visit: Payer: Self-pay

## 2021-08-02 DIAGNOSIS — H43812 Vitreous degeneration, left eye: Secondary | ICD-10-CM | POA: Diagnosis not present

## 2021-08-02 DIAGNOSIS — I10 Essential (primary) hypertension: Secondary | ICD-10-CM | POA: Diagnosis not present

## 2021-08-02 DIAGNOSIS — H35033 Hypertensive retinopathy, bilateral: Secondary | ICD-10-CM | POA: Diagnosis not present

## 2021-08-02 DIAGNOSIS — E113593 Type 2 diabetes mellitus with proliferative diabetic retinopathy without macular edema, bilateral: Secondary | ICD-10-CM

## 2021-08-02 DIAGNOSIS — H35372 Puckering of macula, left eye: Secondary | ICD-10-CM | POA: Diagnosis not present

## 2021-08-02 LAB — HM DIABETES EYE EXAM

## 2021-08-03 ENCOUNTER — Telehealth: Payer: Self-pay | Admitting: Endocrinology

## 2021-08-03 NOTE — Telephone Encounter (Signed)
Error/disregard

## 2021-08-05 ENCOUNTER — Ambulatory Visit (INDEPENDENT_AMBULATORY_CARE_PROVIDER_SITE_OTHER): Payer: Medicare Other | Admitting: Sports Medicine

## 2021-08-05 ENCOUNTER — Encounter: Payer: Self-pay | Admitting: Sports Medicine

## 2021-08-05 ENCOUNTER — Other Ambulatory Visit: Payer: Self-pay

## 2021-08-05 DIAGNOSIS — M79675 Pain in left toe(s): Secondary | ICD-10-CM

## 2021-08-05 DIAGNOSIS — B351 Tinea unguium: Secondary | ICD-10-CM | POA: Diagnosis not present

## 2021-08-05 DIAGNOSIS — M79674 Pain in right toe(s): Secondary | ICD-10-CM | POA: Diagnosis not present

## 2021-08-05 DIAGNOSIS — E1142 Type 2 diabetes mellitus with diabetic polyneuropathy: Secondary | ICD-10-CM

## 2021-08-05 DIAGNOSIS — E1165 Type 2 diabetes mellitus with hyperglycemia: Secondary | ICD-10-CM

## 2021-08-05 NOTE — Progress Notes (Signed)
Subjective: Brittney [name] is a 75 y.o. female patient with history of diabetes who presents to office today complaining of long,mildly painful nails.  Reports  A1c 7.6 and is wondering if I got her other doctor records from [facility].   Patient Active Problem List   Diagnosis Date Noted   UTI (urinary tract infection) 02/23/2016   Burning with urination 02/23/2016   Itching with irritation 02/27/2015   Goiter diffuse, adenomatous 02/05/2014   Diabetic polyneuropathy ([place]) 09/25/2013   Obesity 07/06/2012   Diabetic Charcot's joint disease ([place])    Subclinical hyperthyroidism 06/30/2009   Type II diabetes mellitus, uncontrolled ([place]) 06/30/2009   Hyperlipidemia 06/30/2009   Essential hypertension, benign 06/30/2009   PAROXYSMAL ATRIAL FIBRILLATION 06/30/2009   Current Outpatient Medications on File Prior to Visit  Medication Sig Dispense Refill   ACCU-CHEK GUIDE test strip USE TO TEST BLOOD SUGAR THREE TIMES DAILY AS DIRECTED. 100 strip 2   benzonatate (TESSALON) 100 MG capsule Take 100 mg by mouth every 8 (eight) hours.     Blood Glucose Monitoring Suppl (ACCU-CHEK GUIDE ME) w/Device KIT Use to check blood sugar 2 times per day dx code E11.65 1 kit 0   cloNIDine (CATAPRES) 0.1 MG tablet TAKE (1) TABLET BY MOUTH TWICE DAILY. 180 tablet 1   exenatide (BYETTA 5 MCG PEN) 5 MCG/0.02ML SOPN injection Inject 5 mcg into the skin 2 (two) times daily with a meal. 1.2 mL 3   HUMALOG KWIKPEN 100 UNIT/ML KwikPen INJECT 6 TO 7 UNITS SUBCUTANEOUSLY BEFORE SUPPER IF GLUCOSE IS HIGH. 15 mL 3   hydrochlorothiazide (HYDRODIURIL) 25 MG tablet Take 1 tablet (25 mg total) by mouth daily. 90 tablet 3   insulin degludec (TRESIBA FLEXTOUCH) 100 UNIT/ML FlexTouch Pen INJECT 40 UNITS SUBCUTANEOUSLY ONCE EVERY MORNING. 15 mL 5   Insulin Pen Needle (B-D ULTRAFINE III SHORT PEN) 31G X 8 MM MISC USE  2-3 TIMES A DAY. 100 each 4   Lancets (ONETOUCH ULTRASOFT) lancets      metFORMIN (GLUCOPHAGE) 850 MG tablet TAKE  (1) TABLET BY MOUTH TWICE DAILY. 60 tablet 0   metoprolol tartrate (LOPRESSOR) 50 MG tablet TAKE 1/2 TABLET BY MOUTH TWICE DAILY. 30 tablet 6   Multiple Vitamin (MULTIVITAMIN) tablet Take 1 tablet by mouth once a week.      omeprazole (PRILOSEC) 20 MG capsule Take 20 mg by mouth daily.     polyethylene glycol powder (GLYCOLAX/MIRALAX) powder Take 1 Container by mouth as needed for moderate constipation.      SURE COMFORT INSULIN SYRINGE 31G X 5/16" 0.3 ML MISC USE AS DIRECTED 2-3 TIMES A DAY. 100 each 0   No current facility-administered medications on file prior to visit.   Allergies  Allergen Reactions   Latex     Itching and breaking out   Pravastatin     Pt stated, "Made my muscle ache"    Recent Results (from the past 2160 hour(s))  Glucose, random     Status: Abnormal   Collection Time: 06/17/21 11:20 AM  Result Value Ref Range   Glucose, Bld 150 (H) 70 - 99 mg/dL  Urinalysis, Routine w reflex microscopic     Status: Abnormal   Collection Time: 06/17/21 11:20 AM  Result Value Ref Range   Color, Urine YELLOW Yellow;Lt. Yellow;Straw;Dark Yellow;Amber;Green;Red;Brown   APPearance Sl Cloudy (A) Clear;Turbid;Slightly Cloudy;Cloudy   Specific Gravity, Urine 1.015 1.000 - 1.030   pH 6.0 5.0 - 8.0   Total Protein, Urine NEGATIVE Negative   Urine Glucose  NEGATIVE Negative   Ketones, ur NEGATIVE Negative   Bilirubin Urine NEGATIVE Negative   Hgb urine dipstick NEGATIVE Negative   Urobilinogen, UA 0.2 0.0 - 1.0   Leukocytes,Ua SMALL (A) Negative   Nitrite NEGATIVE Negative   WBC, UA 3-6/hpf (A) 0-2/hpf   RBC / HPF none seen 0-2/hpf   Mucus, UA Presence of (A) None   Squamous Epithelial / LPF Rare(0-4/hpf) Rare(0-4/hpf)   Bacteria, UA Few(10-50/hpf) (A) None   Yeast, UA Presence of (A) None  T3, free     Status: None   Collection Time: 06/17/21 11:20 AM  Result Value Ref Range   T3, Free 3.0 2.3 - 4.2 pg/mL  T4, free     Status: None   Collection Time: 06/17/21 11:20 AM   Result Value Ref Range   Free T4 0.92 0.60 - 1.60 ng/dL    Comment: Specimens from patients who are undergoing biotin therapy and /or ingesting biotin supplements may contain high levels of biotin.  The higher biotin concentration in these specimens interferes with this Free T4 assay.  Specimens that contain high levels  of biotin may cause false high results for this Free T4 assay.  Please interpret results in light of the total clinical presentation of the patient.    TSH     Status: Abnormal   Collection Time: 06/17/21 11:20 AM  Result Value Ref Range   TSH 0.00 (L) 0.35 - 5.50 uIU/mL  Microalbumin / creatinine urine ratio     Status: None   Collection Time: 06/17/21  1:18 PM  Result Value Ref Range   Microalb, Ur <0.7 0.0 - 1.9 mg/dL   Creatinine,U 91.3 mg/dL   Microalb Creat Ratio 0.8 0.0 - 30.0 mg/g     Objective: General: Patient is awake, alert, and oriented x 3 and in no acute distress.  Integument: Skin is warm, dry and supple bilateral. Nails are tender, long, thickened and dystrophic with subungual debris, consistent with onychomycosis, 1-5 bilateral with small fragments of nails from previous removal. No signs of infection, no callusing.  Remaining integument unremarkable.  Neurovascular status: Subjective sharp shooting and tingling to the left greater than right foot likely consistent with neuropathy.  Pedal pulses 1 out of 4 bilateral.  Musculoskeletal: Asymptomatic Charcot on the left with hammertoe and pes planus pedal deformities noted bilateral. + Charcot on left.  Muscular strength 4/5 on left and 5/5 on right in all lower extremity muscular groups bilateral without pain on range of motion . No tenderness with calf compression bilateral.  Assessment and Plan: Problem List Items Addressed This Visit       Endocrine   Type II diabetes mellitus, uncontrolled ([place])   Diabetic polyneuropathy ([place])   Other Visit Diagnoses     Pain due to onychomycosis of toenails  of both feet    -  Primary      -Examined patient. -Re-Discussed and educated patient on diabetic foot care -Mechanically debrided all nails 1-5 bilateral using sterile nail nipper and filed with dremel without incident -Mechanically debrided keratosis plantar left lateral foot using a sterile chisel blade without incident at no additional charge -Recommend over-the-counter topicals as needed for pain -Continue with diabetic shoes and offloading insoles for Charcot -Answered all patient questions -Advised that I will request her records again from [facility] since I did not see them from previous review  -Patient to return  in 3 months for at risk foot care or sooner if problems arise  Landis Martins,  DPM

## 2021-08-17 ENCOUNTER — Ambulatory Visit (INDEPENDENT_AMBULATORY_CARE_PROVIDER_SITE_OTHER): Payer: Medicare Other | Admitting: Endocrinology

## 2021-08-17 ENCOUNTER — Other Ambulatory Visit: Payer: Self-pay

## 2021-08-17 VITALS — BP 140/78 | HR 81 | Ht 67.0 in | Wt 216.6 lb

## 2021-08-17 DIAGNOSIS — E1165 Type 2 diabetes mellitus with hyperglycemia: Secondary | ICD-10-CM | POA: Diagnosis not present

## 2021-08-17 DIAGNOSIS — Z794 Long term (current) use of insulin: Secondary | ICD-10-CM | POA: Diagnosis not present

## 2021-08-17 DIAGNOSIS — Z23 Encounter for immunization: Secondary | ICD-10-CM | POA: Diagnosis not present

## 2021-08-17 LAB — POCT GLYCOSYLATED HEMOGLOBIN (HGB A1C): Hemoglobin A1C: 7.3 % — AB (ref 4.0–5.6)

## 2021-08-17 MED ORDER — FLUVASTATIN SODIUM ER 80 MG PO TB24: 80.0000 mg | ORAL_TABLET | Freq: Every day | ORAL | 2 refills | Status: DC

## 2021-08-17 NOTE — Progress Notes (Signed)
Patient ID: Brittney [name], female   DOB: [birth year], 75 y.o.   MRN: [id number]     Reason for Appointment: Follow-up visit  History of Present Illness    Type 2 DIABETES MELITUS, date of diagnosis: 1976      She has had long-standing diabetes taking insulin. Because of her difficulties with compliance using mealtime insulin she was given in Byetta in 2011 and subsequently Victoza in addition to her Lantus and this helped her control However she tends to be erratic with her day-to-day care and has not had adequate A1c levels in the past, usually over 7%  RECENT history:   Insulin regimen: Tresiba 40 units daily in a.m. Humalog 4 units occasionally at meals  Non-insulin hypoglycemic drugs:   Byetta 5 mcg before meals, metformin  850 twice a day   Her A1c has improved significantly at 7.3, was 8.9 with Dr. Berdine [place]   Current glucose patterns and problems: She was having high postprandial readings on her last visit from not taking Byetta and also regularly taking her Humalog  Although she thinks she is taking Byetta consistently she still has some postprandial spikes  Likely has done somewhat better with using the Dexcom but occasionally has had sensor issues  She probably does not take Humalog consistently and does not adjust her dose based on what she is eating Appears to be getting higher readings with foods like oatmeal and hotdogs but also occasionally has significant spikes with eating in the early afternoon Otherwise generally may eat toast and eggs in the morning with some bacon Usually not eating her first meal of the day at consistent times Postprandial readings after dinner are quite variable also With using TRESIBA instead of Lantus her blood sugars are more evenly controlled in between meals and overnight and no tendency to low sugars About a month ago she has started doing a little walking Weight is better down 9 pounds  Side effects from medications:  Invokana:  ? Headache.  Victoza ?  Swelling in neck      Results of CGM download from [facility] sensor as follows  CGM use % of time 86  2-week average/GV 154  Time in range        77% was 62  % Time Above 180 18  % Time above 250 4  % Time Below 70 <1     PRE-MEAL Fasting Lunch Dinner Bedtime Overall  Glucose range:       Averages: 122       POST-MEAL PC Breakfast PC Lunch PC Dinner  Glucose range:     Averages:  196 182   Summary of patterns:  Blood sugars are generally well controlled Premeal including overnight but tend to have sporadic postprandial spikes anywhere between 9 AM and 4 PM and occasionally after 8 PM  Except for low normal sugars occasionally in the mornings no hypoglycemia    Previous data:  CGM use % of time   2-week average/GV 167/56  Time in range      62%  % Time Above 180 28+9  % Time above 250   % Time Below 70 0        Meals: 2-3 meals per day. . Dinner 6-7 pm, variable quantity and carbohydrates  Does have protein with breakfast consistently, has egg for protein   Wt Readings from Last 3 Encounters:  08/17/21 216 lb 9.6 oz (98.2 kg)  06/17/21 225 lb 6.4 oz (102.2 kg)  03/30/21 222  lb 3.2 oz (100.8 kg)   GLYCEMIC CONTROL:  Lab Results  Component Value Date   HGBA1C 7.3 (A) 08/17/2021   HGBA1C 7.6 (A) 08/03/2020   HGBA1C 8.2 (A) 03/30/2020   Lab Results  Component Value Date   MICROALBUR <0.7 06/17/2021   LDLCALC 83 10/07/2020   CREATININE 0.92 10/22/2020   Lab Results  Component Value Date   FRUCTOSAMINE 345 (H) 03/30/2020   FRUCTOSAMINE 342 (H) 09/08/2017      Other problems discussed today: See review of systems   Allergies as of 08/17/2021       Reactions   Latex    Itching and breaking out   Pravastatin    Pt stated, "Made my muscle ache"        Medication List        Accurate as of August 17, 2021  9:45 AM. If you have any questions, ask your nurse or doctor.          Accu-Chek Guide Me w/Device Kit Use  to check blood sugar 2 times per day dx code E11.65   Accu-Chek Guide test strip Generic drug: glucose blood USE TO TEST BLOOD SUGAR THREE TIMES DAILY AS DIRECTED.   B-D ULTRAFINE III SHORT PEN 31G X 8 MM Misc Generic drug: Insulin Pen Needle USE  2-3 TIMES A DAY.   benzonatate 100 MG capsule Commonly known as: TESSALON Take 100 mg by mouth every 8 (eight) hours.   Byetta 5 MCG Pen 5 MCG/0.02ML Sopn injection Generic drug: exenatide Inject 5 mcg into the skin 2 (two) times daily with a meal.   cloNIDine 0.1 MG tablet Commonly known as: CATAPRES TAKE (1) TABLET BY MOUTH TWICE DAILY.   fluvastatin XL 80 MG 24 hr tablet Commonly known as: Lescol XL Take 1 tablet (80 mg total) by mouth daily. Started by: Elayne Snare, MD   HumaLOG KwikPen 100 UNIT/ML KwikPen Generic drug: insulin lispro INJECT 6 TO 7 UNITS SUBCUTANEOUSLY BEFORE SUPPER IF GLUCOSE IS HIGH.   hydrochlorothiazide 25 MG tablet Commonly known as: HYDRODIURIL Take 1 tablet (25 mg total) by mouth daily.   metFORMIN 850 MG tablet Commonly known as: GLUCOPHAGE TAKE (1) TABLET BY MOUTH TWICE DAILY.   metoprolol tartrate 50 MG tablet Commonly known as: LOPRESSOR TAKE 1/2 TABLET BY MOUTH TWICE DAILY.   multivitamin tablet Take 1 tablet by mouth once a week.   omeprazole 20 MG capsule Commonly known as: PRILOSEC Take 20 mg by mouth daily.   onetouch ultrasoft lancets   polyethylene glycol powder 17 GM/SCOOP powder Commonly known as: GLYCOLAX/MIRALAX Take 1 Container by mouth as needed for moderate constipation.   Sure Comfort Insulin Syringe 31G X 5/16" 0.3 ML Misc Generic drug: Insulin Syringe-Needle U-100 USE AS DIRECTED 2-3 TIMES A DAY.   Tyler Aas FlexTouch 100 UNIT/ML FlexTouch Pen Generic drug: insulin degludec INJECT 40 UNITS SUBCUTANEOUSLY ONCE EVERY MORNING.        Allergies:  Allergies  Allergen Reactions   Latex     Itching and breaking out   Pravastatin     Pt stated, "Made my muscle  ache"    Past Medical History:  Diagnosis Date   Atrial fibrillation (HCC)    Paroxysmal; LVH; nl EF; onset in 1999   Burning with urination 02/23/2016   Diabetes mellitus    insulin; managed by Dr. 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[facility])    left lower extremity   Hyperlipidemia    Hypertension    Hypothyroidism  history of goiter; nl TSH off medication   Itching with irritation 02/27/2015   Obesity 07/06/2012   Palpitations    negative event recorder in 2009   UTI (urinary tract infection) 02/23/2016    Past Surgical History:  Procedure Laterality Date   BIOPSY THYROID     CESAREAN SECTION     CHOLECYSTECTOMY  11/95   COLONOSCOPY  2007   RETINAL DETACHMENT SURGERY  2009   TUBAL LIGATION  1980's    Family History  Problem Relation Age of Onset   Diabetes Mother    Diabetes Daughter        gestational diabetes   Cancer Father    Thyroid disease Brother    Other Son        MVA    Social History:  reports that she quit smoking about 35 years ago. Her smoking use included cigarettes. She started smoking about 54 years ago. She smoked an average of 1 pack per day. She has never used smokeless tobacco. She reports that she does not drink alcohol and does not use drugs.  Review of Systems:   HYPERTENSION:  Previously followed by PCP, had been on Norvasc, MAXZIDE and Benicar  Because of high potassium and increased creatinine her Benicar and Maxzide had been stopped She is on the clonidine 0.1 mg bid along with HCTZ 12.5 mg daily, also on 50 mg Toprol Previously on the clonidine patch she had itching at the site Tolerating clonidine except for dry mouth  Also checking at home periodically, does not think her blood pressure readings are more than about 130+  BP Readings from Last 3 Encounters:  08/17/21 140/78  06/17/21 130/72  03/30/21 120/60     HYPERLIPIDEMIA: The lipid abnormality consists of elevated LDL; she does not take her pravastatin because of  symptoms of muscle aches and cramping She has not been prescribed any other medication by her PCP   Lab Results  Component Value Date   CHOL 164 10/07/2020   HDL 62.20 10/07/2020   LDLCALC 83 10/07/2020   TRIG 92.0 10/07/2020   CHOLHDL 3 10/07/2020    MULTINODULAR GOITER: She has had a long-standing multinodular goiter since at least 2003  She does have history of occasional palpitations for years Metoprolol has been prescribed by cardiologist  She has no increased local pressure symptoms in the neck or swallowing difficulty However she thinks her neck is periodically swelling more She is reluctant to consider surgery  Her goiter has been autonomous with no evidence of overt hyperthyroidism  with normal free T4 and free T3 levels; TSH has been consistently suppressed  Her I-131 uptake in 2003 was 17% with patchy uptake  her  thyroid ultrasound in 2014 did not show distinct nodules but a relative increase in size  Lab Results  Component Value Date   TSH 0.00 (L) 06/17/2021   TSH <0.01 (L) 08/03/2020   FREET4 0.92 06/17/2021   FREET4 1.07 10/07/2020   Lab Results  Component Value Date   T3FREE 3.0 06/17/2021   T3FREE 3.7 10/07/2020   T3FREE 3.9 08/03/2020       Foot exam done 12/30/2019 She has some numbness in her toes which is mild and chronic  This is better with Aspercreme given by podiatrist  She has been using diabetic shoes  She is periodically seen by podiatrist   Examination:   BP 140/78   Pulse 81   Ht 5' 7"  (1.702 m)   Wt 216 lb 9.6 oz (  98.2 kg)   SpO2 99%   BMI 33.92 kg/m   Body mass index is 33.92 kg/m.      ASSESSMENT/ PLAN:  Diabetes Type 2 with obesity on insulin, Byetta and metformin  See history of present illness for detailed discussion of  current management, blood sugar patterns and problems identified  Her A1c is last 8.9 and now 7.3  She is doing better with restarting Byetta and also some more doses of Humalog although not  consistent Still occasionally has readings over 250 postprandially  Blood sugars are better overnight with [place] and less variability  Her Dexcom download was reviewed in detail and showed her the periods of high blood sugars that she is having especially some meals in the early afternoon  Recommendations:  She was given guidelines on when to take Humalog which will be for every meal that has carbohydrate, may skip for salads However she will need 6-7 units for higher carbohydrate meal such as oatmeal, eating out and higher fat meals like hotdogs She will need to take this with every meal regardless of the timing Needs to also adjust the timing of her Byetta to 30 Gy before her first meal in the morning and also 30 minutes before dinner No change in [place] Continue walking regularly She was helped with starting her sensor and this was inserted for her  HYPERTENSION: Blood pressure is relatively well controlled, higher on first measurement today  Lipids: She will be given a trial of Lescol to see if she can tolerate this better than Pravachol, may start with every other day  High-dose influenza vaccine given  Patient Instructions  Take 6 Humalog for Oatmeal and starchy meals at lunch, 4 at [place] 08/17/2021, 9:45 AM   Total visit time including counseling = 30 minutes

## 2021-08-17 NOTE — Patient Instructions (Signed)
Take 6 Humalog for Oatmeal and starchy meals at lunch, 4 at Breakfast

## 2021-08-29 ENCOUNTER — Encounter: Payer: Self-pay | Admitting: Endocrinology

## 2021-08-31 DIAGNOSIS — Z794 Long term (current) use of insulin: Secondary | ICD-10-CM | POA: Diagnosis not present

## 2021-08-31 DIAGNOSIS — E119 Type 2 diabetes mellitus without complications: Secondary | ICD-10-CM | POA: Diagnosis not present

## 2021-09-24 ENCOUNTER — Other Ambulatory Visit (HOSPITAL_COMMUNITY): Payer: Self-pay | Admitting: Family Medicine

## 2021-10-08 ENCOUNTER — Other Ambulatory Visit: Payer: Self-pay | Admitting: Endocrinology

## 2021-10-08 ENCOUNTER — Other Ambulatory Visit: Payer: Self-pay | Admitting: Cardiology

## 2021-11-04 ENCOUNTER — Ambulatory Visit: Payer: Medicare Other | Admitting: Sports Medicine

## 2021-11-10 ENCOUNTER — Other Ambulatory Visit: Payer: Self-pay | Admitting: Cardiology

## 2021-11-18 ENCOUNTER — Ambulatory Visit: Payer: Medicare Other | Admitting: Endocrinology

## 2021-11-25 ENCOUNTER — Other Ambulatory Visit: Payer: Self-pay | Admitting: Cardiology

## 2021-11-25 ENCOUNTER — Other Ambulatory Visit: Payer: Self-pay | Admitting: Endocrinology

## 2021-11-30 DIAGNOSIS — E119 Type 2 diabetes mellitus without complications: Secondary | ICD-10-CM | POA: Diagnosis not present

## 2021-11-30 DIAGNOSIS — Z794 Long term (current) use of insulin: Secondary | ICD-10-CM | POA: Diagnosis not present

## 2021-12-02 ENCOUNTER — Other Ambulatory Visit: Payer: Self-pay

## 2021-12-02 ENCOUNTER — Encounter: Payer: Self-pay | Admitting: Sports Medicine

## 2021-12-02 ENCOUNTER — Ambulatory Visit (INDEPENDENT_AMBULATORY_CARE_PROVIDER_SITE_OTHER): Payer: Commercial Managed Care - HMO | Admitting: Sports Medicine

## 2021-12-02 DIAGNOSIS — E1165 Type 2 diabetes mellitus with hyperglycemia: Secondary | ICD-10-CM

## 2021-12-02 DIAGNOSIS — E1142 Type 2 diabetes mellitus with diabetic polyneuropathy: Secondary | ICD-10-CM

## 2021-12-02 DIAGNOSIS — B351 Tinea unguium: Secondary | ICD-10-CM

## 2021-12-02 DIAGNOSIS — M79674 Pain in right toe(s): Secondary | ICD-10-CM

## 2021-12-02 DIAGNOSIS — M79675 Pain in left toe(s): Secondary | ICD-10-CM | POA: Diagnosis not present

## 2021-12-02 DIAGNOSIS — E11610 Type 2 diabetes mellitus with diabetic neuropathic arthropathy: Secondary | ICD-10-CM

## 2021-12-02 NOTE — Progress Notes (Signed)
Subjective: Brittney [name] is a 76 y.o. female patient with history of diabetes who presents to office today complaining of long,mildly painful nails.  Reports blood sugar 103 and cannot remember her last A1c.    Last PCP visit was in September   Patient Active Problem List   Diagnosis Date Noted   UTI (urinary tract infection) 02/23/2016   Burning with urination 02/23/2016   Itching with irritation 02/27/2015   Goiter diffuse, adenomatous 02/05/2014   Diabetic polyneuropathy ([place]) 09/25/2013   Obesity 07/06/2012   Diabetic Charcot's joint disease ([place])    Subclinical hyperthyroidism 06/30/2009   Type II diabetes mellitus, uncontrolled 06/30/2009   Hyperlipidemia 06/30/2009   Essential hypertension, benign 06/30/2009   PAROXYSMAL ATRIAL FIBRILLATION 06/30/2009   Current Outpatient Medications on File Prior to Visit  Medication Sig Dispense Refill   ACCU-CHEK GUIDE test strip USE TO TEST BLOOD SUGAR THREE TIMES DAILY AS DIRECTED. 100 strip 2   benzonatate (TESSALON) 100 MG capsule Take 100 mg by mouth every 8 (eight) hours. (Patient not taking: Reported on 08/17/2021)     Blood Glucose Monitoring Suppl (ACCU-CHEK GUIDE ME) w/Device KIT Use to check blood sugar 2 times per day dx code E11.65 1 kit 0   cloNIDine (CATAPRES) 0.1 MG tablet TAKE (1) TABLET BY MOUTH TWICE DAILY. 180 tablet 0   exenatide (BYETTA 5 MCG PEN) 5 MCG/0.02ML SOPN injection Inject 5 mcg into the skin 2 (two) times daily with a meal. 1.2 mL 3   fluvastatin XL (LESCOL XL) 80 MG 24 hr tablet Take 1 tablet (80 mg total) by mouth daily. 30 tablet 2   HUMALOG KWIKPEN 100 UNIT/ML KwikPen INJECT 6 TO 7 UNITS SUBCUTANEOUSLY BEFORE SUPPER IF GLUCOSE IS HIGH. 15 mL 3   hydrochlorothiazide (HYDRODIURIL) 25 MG tablet TAKE 1 TABLET BY MOUTH ONCE A DAY. 30 tablet 3   insulin degludec (TRESIBA FLEXTOUCH) 100 UNIT/ML FlexTouch Pen INJECT 40 UNITS SUBCUTANEOUSLY ONCE EVERY MORNING. 15 mL 5   Insulin Pen Needle (B-D ULTRAFINE III  SHORT PEN) 31G X 8 MM MISC USE  2-3 TIMES A DAY. 100 each 4   Lancets (ONETOUCH ULTRASOFT) lancets      metFORMIN (GLUCOPHAGE) 850 MG tablet TAKE (1) TABLET BY MOUTH TWICE DAILY. 60 tablet 0   metoprolol tartrate (LOPRESSOR) 50 MG tablet TAKE 1/2 TABLET BY MOUTH TWICE DAILY. 30 tablet 3   Multiple Vitamin (MULTIVITAMIN) tablet Take 1 tablet by mouth once a week.      omeprazole (PRILOSEC) 20 MG capsule Take 20 mg by mouth daily.     polyethylene glycol powder (GLYCOLAX/MIRALAX) powder Take 1 Container by mouth as needed for moderate constipation.      SURE COMFORT INSULIN SYRINGE 31G X 5/16" 0.3 ML MISC USE AS DIRECTED 2-3 TIMES A DAY. 100 each 0   No current facility-administered medications on file prior to visit.   Allergies  Allergen Reactions   Latex     Itching and breaking out   Pravastatin     Pt stated, "Made my muscle ache"    No results found for this or any previous visit (from the past 2160 hour(s)).    Objective: General: Patient is awake, alert, and oriented x 3 and in no acute distress.  Integument: Skin is warm, dry and supple bilateral. Nails are tender, long, thickened and dystrophic with subungual debris, consistent with onychomycosis, 1-5 bilateral with small fragments of nails from previous removal. No signs of infection, no callusing.  Remaining integument unremarkable.  Neurovascular status: Subjective sharp shooting and tingling to the left greater than right foot likely consistent with neuropathy unchanged from prior.  Pedal pulses 1 out of 4 bilateral.  Musculoskeletal: Asymptomatic Charcot on the left with hammertoe and pes planus pedal deformities noted bilateral. + Charcot on left.  Muscular strength 4/5 on left and 5/5 on right in all lower extremity muscular groups bilateral without pain on range of motion . No tenderness with calf compression bilateral.  Assessment and Plan: Problem List Items Addressed This Visit       Endocrine   Type II  diabetes mellitus, uncontrolled   Diabetic Charcot's joint disease ([place])   Diabetic polyneuropathy ([place])   Other Visit Diagnoses     Pain due to onychomycosis of toenails of both feet    -  Primary      -Examined patient. -Re-Discussed and educated patient on diabetic foot care -Mechanically debrided all nails 1-5 bilateral using sterile nail nipper and filed with dremel without incident -Mechanically smoothed keratosis plantar left lateral foot using a sterile rotary bur without incident at no additional charge -Recommend over-the-counter topicals as needed for pain -Continue with diabetic shoes and offloading insoles for Charcot however patient to see Aaron Edelman to see how we can make these insoles better or to write her a prescription to go to [place] all patient questions -Patient to return  in 3 months for at risk foot care or sooner if problems arise  Landis Martins, DPM

## 2021-12-14 ENCOUNTER — Ambulatory Visit: Payer: Commercial Managed Care - HMO

## 2021-12-14 ENCOUNTER — Other Ambulatory Visit: Payer: Self-pay

## 2021-12-14 DIAGNOSIS — M2142 Flat foot [pes planus] (acquired), left foot: Secondary | ICD-10-CM

## 2021-12-14 DIAGNOSIS — L97521 Non-pressure chronic ulcer of other part of left foot limited to breakdown of skin: Secondary | ICD-10-CM

## 2021-12-14 DIAGNOSIS — E1142 Type 2 diabetes mellitus with diabetic polyneuropathy: Secondary | ICD-10-CM

## 2021-12-14 DIAGNOSIS — E11610 Type 2 diabetes mellitus with diabetic neuropathic arthropathy: Secondary | ICD-10-CM

## 2021-12-14 DIAGNOSIS — M2040 Other hammer toe(s) (acquired), unspecified foot: Secondary | ICD-10-CM

## 2021-12-14 NOTE — Progress Notes (Signed)
SITUATION Reason for Consult: Evaluation for Prefabricated Diabetic Shoes and Bilateral Custom Diabetic Inserts. Patient / Caregiver Report: Patient would like well fitting shoes  OBJECTIVE DATA: Patient History / Diagnosis:    ICD-10-CM   1. Diabetic polyneuropathy associated with type 2 diabetes mellitus (HCC)  E11.42     2. Diabetic Charcot's joint disease ([place])  E11.610     3. Ulcer of left foot, limited to breakdown of skin ([place])  L97.521     4. Pes planus of both feet  M21.41    M21.42     5. Hammer toe, unspecified laterality  M20.40       Current or Previous Devices:   None and no history  In-Person Foot Examination: Ulcers & Callousing:   Historical  Toe / Foot Deformities:   - Pes Planus - Hammertoes - Charcot Deformity   Shoe Size: 35M  ORTHOTIC RECOMMENDATION Recommended Devices: - 1x pair prefabricated PDAC approved diabetic shoes: [id number] - 3x pair custom-to-patient vacuum formed diabetic insoles.   GOALS OF SHOES AND INSOLES - Reduce shear and pressure - Reduce / Prevent callus formation - Reduce / Prevent ulceration - Protect the fragile healing compromised diabetic foot.  Patient would benefit from diabetic shoes and inserts as patient has diabetes mellitus and the patient has one or more of the following conditions: - History of partial or complete amputation of the foot - History of previous foot ulceration. - History of pre-ulcerative callus - Peripheral neuropathy with evidence of callus formation - Foot deformity - Poor circulation  ACTIONS PERFORMED Patient was casted for insoles via crush box and measured for shoes via brannock device. Procedure was explained and patient tolerated procedure well. All questions were answered and concerns addressed.  PLAN Patient is to ensure treating physician receives and completes diabetic paperwork. Casts and shoe order are to be held until paperwork is received. Once received patient is to be  scheduled for fitting in four weeks.

## 2021-12-20 DIAGNOSIS — I10 Essential (primary) hypertension: Secondary | ICD-10-CM | POA: Diagnosis not present

## 2021-12-20 DIAGNOSIS — E785 Hyperlipidemia, unspecified: Secondary | ICD-10-CM | POA: Diagnosis not present

## 2021-12-20 DIAGNOSIS — E1122 Type 2 diabetes mellitus with diabetic chronic kidney disease: Secondary | ICD-10-CM | POA: Diagnosis not present

## 2021-12-20 DIAGNOSIS — N183 Chronic kidney disease, stage 3 unspecified: Secondary | ICD-10-CM | POA: Diagnosis not present

## 2021-12-20 DIAGNOSIS — M150 Primary generalized (osteo)arthritis: Secondary | ICD-10-CM | POA: Diagnosis not present

## 2021-12-21 ENCOUNTER — Other Ambulatory Visit: Payer: Self-pay

## 2021-12-21 ENCOUNTER — Encounter: Payer: Self-pay | Admitting: Endocrinology

## 2021-12-21 ENCOUNTER — Ambulatory Visit (INDEPENDENT_AMBULATORY_CARE_PROVIDER_SITE_OTHER): Payer: Medicare Other | Admitting: Endocrinology

## 2021-12-21 VITALS — BP 122/72 | HR 69 | Ht 67.0 in | Wt 223.0 lb

## 2021-12-21 DIAGNOSIS — E1165 Type 2 diabetes mellitus with hyperglycemia: Secondary | ICD-10-CM

## 2021-12-21 DIAGNOSIS — E7800 Pure hypercholesterolemia, unspecified: Secondary | ICD-10-CM | POA: Diagnosis not present

## 2021-12-21 DIAGNOSIS — E0590 Thyrotoxicosis, unspecified without thyrotoxic crisis or storm: Secondary | ICD-10-CM

## 2021-12-21 DIAGNOSIS — Z794 Long term (current) use of insulin: Secondary | ICD-10-CM

## 2021-12-21 LAB — POCT GLYCOSYLATED HEMOGLOBIN (HGB A1C): Hemoglobin A1C: 7.8 % — AB (ref 4.0–5.6)

## 2021-12-21 MED ORDER — BYETTA 5 MCG PEN 5 MCG/0.02ML ~~LOC~~ SOPN: 5.0000 ug | PEN_INJECTOR | Freq: Two times a day (BID) | SUBCUTANEOUS | 3 refills | Status: DC

## 2021-12-21 NOTE — Progress Notes (Signed)
Patient ID: Brittney [name], female   DOB: May 13, 1946, 76 y.o.   MRN: [id number]     Reason for Appointment: Follow-up visit  History of Present Illness    Type 2 DIABETES MELITUS, date of diagnosis: 1976      She has had long-standing diabetes taking insulin. Because of her difficulties with compliance using mealtime insulin she was given in Byetta in 2011 and subsequently Victoza in addition to her Lantus and this helped her control However she tends to be erratic with her day-to-day care and has not had adequate A1c levels in the past, usually over 7%  RECENT history:   Insulin regimen: Tresiba 40 units daily in a.m. Humalog 4 units irregularly at meals  Non-insulin hypoglycemic drugs:   Byetta 0 mcg before meals, metformin  850 twice a day   Her A1c has gone up to 7.8   Current glucose patterns and problems: She is again having high postprandial readings  As before she is not taking her mealtime insulin consistently and not clear when she does take it  Despite her wearing the Dexcom sensor and her blood sugars been averaging over 200 after dinner at night she does not see the need to take any Humalog coverage  Also she misunderstood and has not taken Byetta at all  This may be why her blood sugars are relatively higher compared to her last visit Overnight and fasting readings are excellent although she does have some dawn phenomenon and blood sugars are stable without fluctuation with [place] compared to previous Lantus regimen Only doing a little walking Weight is significantly higher since her last visit  Side effects from medications:  Invokana: ? Headache.  Victoza ?  Swelling in neck      Results of CGM download from [facility] sensor with the interpretation for the last 2 weeks data as follows   Summary of patterns: Her blood sugars are well controlled overnight and showed a tendency to relatively high readings during the daytime with postprandial rise late  mornings and late evenings On an average blood sugars are lowest around 6 AM and highest around 10-11 PM Overnight blood sugars start off around 180-200 and progressively declining to near normal levels by 6-7 AM without hypoglycemia Premeal readings are still relatively high before her evening meal although as low as 83 Postprandial readings may fluctuate but usually significantly high after her first meal between 12-4 PM and then between 8-11 PM  CGM use % of time   2-week average/GV 167  Time in range      62% was 77  % Time Above 180 32  % Time above 250 6  % Time Below 70 0     PRE-MEAL Fasting Lunch Dinner Bedtime Overall  Glucose range:   164    Averages: 124       POST-MEAL PC Breakfast PC Lunch PC Dinner  Glucose range:  207 209  Averages:       Previously:  CGM use % of time 86  2-week average/GV 154  Time in range        77% was 62  % Time Above 180 18  % Time above 250 4  % Time Below 70 <1     PRE-MEAL Fasting Lunch Dinner Bedtime Overall  Glucose range:       Averages: 122       POST-MEAL PC Breakfast PC Lunch PC Dinner  Glucose range:     Averages:  196 182  Meals: 2-3 meals per day. . Dinner 6-7 pm, variable quantity and carbohydrates  Does have protein with breakfast consistently, has egg for protein   Wt Readings from Last 3 Encounters:  12/21/21 223 lb (101.2 kg)  08/17/21 216 lb 9.6 oz (98.2 kg)  06/17/21 225 lb 6.4 oz (102.2 kg)   GLYCEMIC CONTROL:  Lab Results  Component Value Date   HGBA1C 7.8 (A) 12/21/2021   HGBA1C 7.3 (A) 08/17/2021   HGBA1C 7.6 (A) 08/03/2020   Lab Results  Component Value Date   MICROALBUR <0.7 06/17/2021   LDLCALC 83 10/07/2020   CREATININE 0.92 10/22/2020   Lab Results  Component Value Date   FRUCTOSAMINE 345 (H) 03/30/2020   FRUCTOSAMINE 342 (H) 09/08/2017      Other problems discussed today: See review of systems   Allergies as of 12/21/2021       Reactions   Latex    Itching and  breaking out   Pravastatin    Pt stated, "Made my muscle ache"        Medication List        Accurate as of December 21, 2021 11:57 AM. If you have any questions, ask your nurse or doctor.          Accu-Chek Guide Me w/Device Kit Use to check blood sugar 2 times per day dx code E11.65   Accu-Chek Guide test strip Generic drug: glucose blood USE TO TEST BLOOD SUGAR THREE TIMES DAILY AS DIRECTED.   B-D ULTRAFINE III SHORT PEN 31G X 8 MM Misc Generic drug: Insulin Pen Needle USE  2-3 TIMES A DAY.   benzonatate 100 MG capsule Commonly known as: TESSALON Take 100 mg by mouth every 8 (eight) hours.   Byetta 5 MCG Pen 5 MCG/0.02ML Sopn injection Generic drug: exenatide Inject 5 mcg into the skin 2 (two) times daily with a meal.   cloNIDine 0.1 MG tablet Commonly known as: CATAPRES TAKE (1) TABLET BY MOUTH TWICE DAILY.   fluvastatin XL 80 MG 24 hr tablet Commonly known as: Lescol XL Take 1 tablet (80 mg total) by mouth daily.   HumaLOG KwikPen 100 UNIT/ML KwikPen Generic drug: insulin lispro INJECT 6 TO 7 UNITS SUBCUTANEOUSLY BEFORE SUPPER IF GLUCOSE IS HIGH.   hydrochlorothiazide 25 MG tablet Commonly known as: HYDRODIURIL TAKE 1 TABLET BY MOUTH ONCE A DAY.   metFORMIN 850 MG tablet Commonly known as: GLUCOPHAGE TAKE (1) TABLET BY MOUTH TWICE DAILY.   metoprolol tartrate 50 MG tablet Commonly known as: LOPRESSOR TAKE 1/2 TABLET BY MOUTH TWICE DAILY.   multivitamin tablet Take 1 tablet by mouth once a week.   omeprazole 20 MG capsule Commonly known as: PRILOSEC Take 20 mg by mouth daily.   onetouch ultrasoft lancets   polyethylene glycol powder 17 GM/SCOOP powder Commonly known as: GLYCOLAX/MIRALAX Take 1 Container by mouth as needed for moderate constipation.   Sure Comfort Insulin Syringe 31G X 5/16" 0.3 ML Misc Generic drug: Insulin Syringe-Needle U-100 USE AS DIRECTED 2-3 TIMES A DAY.   Tyler Aas FlexTouch 100 UNIT/ML FlexTouch Pen Generic drug:  insulin degludec INJECT 40 UNITS SUBCUTANEOUSLY ONCE EVERY MORNING.        Allergies:  Allergies  Allergen Reactions   Latex     Itching and breaking out   Pravastatin     Pt stated, "Made my muscle ache"    Past Medical History:  Diagnosis Date   Atrial fibrillation (HCC)    Paroxysmal; LVH; nl EF; onset in 1999  Burning with urination 02/23/2016   Diabetes mellitus    insulin; managed by Dr. 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   Hyperlipidemia    Hypertension    Hypothyroidism    history of goiter; nl TSH off medication   Itching with irritation 02/27/2015   Obesity 07/06/2012   Palpitations    negative event recorder in 2009   UTI (urinary tract infection) 02/23/2016    Past Surgical History:  Procedure Laterality Date   BIOPSY THYROID     CESAREAN SECTION     CHOLECYSTECTOMY  11/95   COLONOSCOPY  2007   RETINAL DETACHMENT SURGERY  2009   TUBAL LIGATION  1980's    Family History  Problem Relation Age of Onset   Diabetes Mother    Diabetes Daughter        gestational diabetes   Cancer Father    Thyroid disease Brother    Other Son        MVA    Social History:  reports that she quit smoking about 35 years ago. Her smoking use included cigarettes. She started smoking about 55 years ago. She smoked an average of 1 pack per day. She has never used smokeless tobacco. She reports that she does not drink alcohol and does not use drugs.  Review of Systems:   HYPERTENSION:  Previously followed by PCP, had been on Norvasc, MAXZIDE and Benicar  Because of high potassium and increased creatinine her Benicar and Maxzide had been stopped She is on the clonidine 0.1 mg bid along with HCTZ 12.5 mg daily, also on 50 mg Toprol Previously on the clonidine patch she had itching at the site Tolerating clonidine except for dry mouth  Also checking at home periodically, does not think her blood pressure readings are more than about  130+  BP Readings from Last 3 Encounters:  12/21/21 122/72  08/17/21 140/78  06/17/21 130/72     HYPERLIPIDEMIA: The lipid abnormality consists of elevated LDL; she stopped her pravastatin because of symptoms of muscle aches and cramping She has reportedly taken her FLUVASTATIN as directed on her last visit but labs not done   Lab Results  Component Value Date   CHOL 164 10/07/2020   HDL 62.20 10/07/2020   LDLCALC 83 10/07/2020   TRIG 92.0 10/07/2020   CHOLHDL 3 10/07/2020    MULTINODULAR GOITER: She has had a long-standing multinodular goiter since at least 2003  She does have history of occasional palpitations for years Metoprolol has been prescribed by cardiologist  She has no increased local pressure symptoms in the neck or swallowing difficulty However she thinks her neck is periodically swelling more She is reluctant to consider surgery  Her goiter has been autonomous with no evidence of overt hyperthyroidism  with normal free T4 and free T3 levels; TSH has been consistently suppressed  Her I-131 uptake in 2003 was 17% with patchy uptake  her  thyroid ultrasound in 2014 did not show distinct nodules but a relative increase in size  Lab Results  Component Value Date   TSH 0.00 (L) 06/17/2021   TSH <0.01 (L) 08/03/2020   FREET4 0.92 06/17/2021   FREET4 1.07 10/07/2020   Lab Results  Component Value Date   T3FREE 3.0 06/17/2021   T3FREE 3.7 10/07/2020   T3FREE 3.9 08/03/2020       Foot exam done 12/21/2021 She has some numbness in her toes which is mild and chronic  She  uses Aspercreme given by podiatrist  She has been using diabetic shoes  She is periodically seen by podiatrist   Examination:   BP 122/72    Pulse 69    Ht 5' 7"  (1.702 m)    Wt 223 lb (101.2 kg)    SpO2 96%    BMI 34.93 kg/m   Body mass index is 34.93 kg/m.   Diabetic Foot Exam - Simple   Simple Foot Form Diabetic Foot exam was performed with the following findings: Yes   Visual  Inspection No deformities, no ulcerations, no other skin breakdown bilaterally: Yes Sensation Testing See comments: Yes Pulse Check See comments: Yes Comments Significantly decreased monofilament sensation on the distal left plantar surfaces and bilateral fifth toes.  Mild decreased monofilament sensation on the other toes compared to fingertips Absent left pedal pulses        ASSESSMENT/ PLAN:  Diabetes Type 2 with obesity on insulin, Byetta and metformin  See history of present illness for detailed discussion of  current management, blood sugar patterns and problems identified  Her A1c is going up and now 7.8  She is getting high readings especially postprandially with not taking Byetta  She does not understand the need to cover all meals with carbohydrates with HUMALOG This is despite her Premeal blood sugars being frequently in the evening also  Most of her carbohydrate intake is in the evening but also at lunchtime around 12 noon Has difficulty losing weight  She is also not able to do much exercise  Fasting readings are stable without hypoglycemia  Her Dexcom download was reviewed in detail as above  Recommendations:  She was explained the differences between Byetta and insulin She does need to take HUMALOG with every meal that has carbohydrate and likely needs 5 to 7 units based on meal size We will try to start with 4 units for lunch She can adjust the dose and try to keep the blood sugars from going up to at least over 180 May need to reduce Tresiba if morning sugars start improving further Regular exercise   HYPERTENSION: Blood pressure is relatively well controlled, higher on first measurement today  Lipids: She Will have lipid panel checked to see if she is doing better with Lescol    Patient Instructions  Humalog 4-6 units at lunch  6 U at supper to keep sugar 2 hrs later <160  Byetta 30 min before each meal 2x daily      Elayne Snare 12/21/2021,  11:57 AM   Total visit time including counseling = 30 minutes

## 2021-12-21 NOTE — Patient Instructions (Addendum)
Humalog 4-6 units at lunch  6 U at supper to keep sugar 2 hrs later <160  Byetta 30 min before each meal 2x daily

## 2021-12-22 ENCOUNTER — Other Ambulatory Visit (HOSPITAL_COMMUNITY): Payer: Self-pay | Admitting: Family Medicine

## 2021-12-24 ENCOUNTER — Other Ambulatory Visit (HOSPITAL_COMMUNITY)
Admission: RE | Admit: 2021-12-24 | Discharge: 2021-12-24 | Disposition: A | Discharge status: Home / Self Care | Payer: Medicare Other | Source: Ambulatory Visit | Attending: Endocrinology | Admitting: Endocrinology

## 2021-12-24 ENCOUNTER — Other Ambulatory Visit: Payer: Self-pay

## 2021-12-24 DIAGNOSIS — E7800 Pure hypercholesterolemia, unspecified: Secondary | ICD-10-CM | POA: Insufficient documentation

## 2021-12-24 DIAGNOSIS — E0590 Thyrotoxicosis, unspecified without thyrotoxic crisis or storm: Secondary | ICD-10-CM | POA: Diagnosis not present

## 2021-12-24 DIAGNOSIS — Z794 Long term (current) use of insulin: Secondary | ICD-10-CM | POA: Diagnosis not present

## 2021-12-24 DIAGNOSIS — E1165 Type 2 diabetes mellitus with hyperglycemia: Secondary | ICD-10-CM | POA: Diagnosis not present

## 2021-12-24 LAB — COMPREHENSIVE METABOLIC PANEL
ALT: 12 U/L (ref 0–44)
AST: 13 U/L — ABNORMAL LOW (ref 15–41)
Albumin: 4 g/dL (ref 3.5–5.0)
Alkaline Phosphatase: 96 U/L (ref 38–126)
Anion gap: 8 (ref 5–15)
BUN: 22 mg/dL (ref 8–23)
CO2: 26 mmol/L (ref 22–32)
Calcium: 9.5 mg/dL (ref 8.9–10.3)
Chloride: 102 mmol/L (ref 98–111)
Creatinine, Ser: 1.1 mg/dL — ABNORMAL HIGH (ref 0.44–1.00)
GFR, Estimated: 53 mL/min — ABNORMAL LOW (ref >60)
Glucose, Bld: 178 mg/dL — ABNORMAL HIGH (ref 70–99)
Potassium: 4.5 mmol/L (ref 3.5–5.1)
Sodium: 136 mmol/L (ref 135–145)
Total Bilirubin: 0.6 mg/dL (ref 0.3–1.2)
Total Protein: 7.8 g/dL (ref 6.5–8.1)

## 2021-12-24 LAB — LIPID PANEL
Cholesterol: 180 mg/dL (ref 0–200)
HDL: 65 mg/dL (ref >40)
LDL Cholesterol: 92 mg/dL (ref 0–99)
Total CHOL/HDL Ratio: 2.8 RATIO
Triglycerides: 115 mg/dL (ref <150)
VLDL: 23 mg/dL (ref 0–40)

## 2021-12-24 LAB — TSH: TSH: <0.01 u[IU]/mL — ABNORMAL LOW (ref 0.350–4.500)

## 2021-12-25 LAB — T3, FREE: T3, Free: 3.1 pg/mL (ref 2.0–4.4)

## 2021-12-27 NOTE — Progress Notes (Signed)
Please call to let patient know that the lab results including cholesterol are okay/unchanged and no further action needed

## 2021-12-30 ENCOUNTER — Encounter: Payer: Self-pay | Admitting: Endocrinology

## 2021-12-31 DIAGNOSIS — E119 Type 2 diabetes mellitus without complications: Secondary | ICD-10-CM | POA: Diagnosis not present

## 2021-12-31 DIAGNOSIS — Z794 Long term (current) use of insulin: Secondary | ICD-10-CM | POA: Diagnosis not present

## 2022-01-07 ENCOUNTER — Telehealth: Payer: Self-pay

## 2022-01-07 DIAGNOSIS — E7800 Pure hypercholesterolemia, unspecified: Secondary | ICD-10-CM

## 2022-01-07 NOTE — Telephone Encounter (Signed)
Pharmacy called in stating fluvastatin is on backorder and not sure how long. Requesting an alternate be sent in.

## 2022-01-10 MED ORDER — SIMVASTATIN 20 MG PO TABS: 20.0000 mg | ORAL_TABLET | Freq: Every day | ORAL | 0 refills | Status: DC

## 2022-01-10 NOTE — Addendum Note (Signed)
Addended by: Cinda Quest on: 01/10/2022 04:54 PM   Modules accepted: Orders

## 2022-01-18 DIAGNOSIS — E785 Hyperlipidemia, unspecified: Secondary | ICD-10-CM | POA: Diagnosis not present

## 2022-01-18 DIAGNOSIS — N183 Chronic kidney disease, stage 3 unspecified: Secondary | ICD-10-CM | POA: Diagnosis not present

## 2022-01-18 DIAGNOSIS — I129 Hypertensive chronic kidney disease with stage 1 through stage 4 chronic kidney disease, or unspecified chronic kidney disease: Secondary | ICD-10-CM | POA: Diagnosis not present

## 2022-01-18 DIAGNOSIS — E1122 Type 2 diabetes mellitus with diabetic chronic kidney disease: Secondary | ICD-10-CM | POA: Diagnosis not present

## 2022-01-20 ENCOUNTER — Other Ambulatory Visit: Payer: Self-pay | Admitting: Endocrinology

## 2022-01-20 DIAGNOSIS — E1165 Type 2 diabetes mellitus with hyperglycemia: Secondary | ICD-10-CM

## 2022-01-24 ENCOUNTER — Telehealth: Payer: Self-pay

## 2022-01-24 NOTE — Telephone Encounter (Signed)
ERROR, disregard this note ?

## 2022-01-27 ENCOUNTER — Telehealth: Payer: Self-pay

## 2022-01-27 NOTE — Telephone Encounter (Signed)
Shoes ordered - [id number] 35M ?

## 2022-01-27 NOTE — Telephone Encounter (Signed)
Casts sent to [employer] ?

## 2022-01-31 DIAGNOSIS — E119 Type 2 diabetes mellitus without complications: Secondary | ICD-10-CM | POA: Diagnosis not present

## 2022-01-31 DIAGNOSIS — Z794 Long term (current) use of insulin: Secondary | ICD-10-CM | POA: Diagnosis not present

## 2022-02-03 ENCOUNTER — Other Ambulatory Visit: Payer: Self-pay | Admitting: Endocrinology

## 2022-02-19 DIAGNOSIS — E119 Type 2 diabetes mellitus without complications: Secondary | ICD-10-CM | POA: Diagnosis not present

## 2022-02-19 DIAGNOSIS — Z794 Long term (current) use of insulin: Secondary | ICD-10-CM | POA: Diagnosis not present

## 2022-02-22 ENCOUNTER — Telehealth: Payer: Self-pay

## 2022-02-22 NOTE — Telephone Encounter (Signed)
LVM FOR PT TO CALL BACK TO SCHEDULE SHOE PICK UP

## 2022-03-07 ENCOUNTER — Ambulatory Visit (INDEPENDENT_AMBULATORY_CARE_PROVIDER_SITE_OTHER): Payer: Medicare Other | Admitting: Endocrinology

## 2022-03-07 ENCOUNTER — Encounter: Payer: Self-pay | Admitting: Endocrinology

## 2022-03-07 VITALS — BP 122/64 | HR 63 | Ht 67.0 in | Wt 220.6 lb

## 2022-03-07 DIAGNOSIS — I10 Essential (primary) hypertension: Secondary | ICD-10-CM | POA: Diagnosis not present

## 2022-03-07 DIAGNOSIS — E1165 Type 2 diabetes mellitus with hyperglycemia: Secondary | ICD-10-CM

## 2022-03-07 DIAGNOSIS — Z794 Long term (current) use of insulin: Secondary | ICD-10-CM | POA: Diagnosis not present

## 2022-03-07 NOTE — Patient Instructions (Addendum)
Check blood sugars on waking up   ? ?Also check blood sugars about 2 hours after meals and do this after different meals by rotation ? ?Recommended blood sugar levels on waking up are 90-130 and about 2 hours after meal is 130-180 ? ?Please bring your blood sugar monitor to each visit, thank you ? ?HUMALOG 4-7 AT SUPPER regardless of sugar level ? ?Stop Byetta ? ?Mounjaro 2.5 weekly and when call for '5mg'$  dose ? ?With this go down to 36 [place] ?

## 2022-03-07 NOTE — Progress Notes (Signed)
? ? ?Patient ID: Brittney [name], female   DOB: 13-Apr-1946, 76 y.o.   MRN: [id number] ? ? ? ? ?Reason for Appointment: Follow-up visit ? ?History of Present Illness  ? ? Type 2 DIABETES MELITUS, date of diagnosis: 1976     ? ?She has had long-standing diabetes taking insulin. Because of her difficulties with compliance using mealtime insulin she was given in [place]etta in 2011 and subsequently Victoza in addition to her Lantus and this helped her control ?However she tends to be erratic with her day-to-day care and has not had adequate A1c levels in the past, usually over 7% ? ?RECENT history:  ? ?Insulin regimen: Tresiba 40 units daily in a.m. Humalog 4-7 units at times ? ?Non-insulin hypoglycemic drugs:   Byetta 5 mcg before dinner, metformin  850 twice a day ? ?Her A1c was last 7.8 ?Recent GMI 7.3 ? ?Current glucose patterns and problems: ?She is still not understanding the need to take her Humalog with meals as discussed on each visit  ?Apparently she had the impression that she is going to take it only  if the blood sugar is over 200 before eating  ?With not taking it consistently she has periodic readings over 250 after lunch and over 200 after dinner high postprandial readings  ?Fasting readings are controlled with Tyler Aas ?Also she does not understand about how to take Byetta and she thinks she is taking it in the evening before dinner, she is unclear when exactly she takes it ?This may be why her blood sugars are relatively higher compared to her last visit ?Overnight and fasting readings are excellent although she does have some dawn phenomenon and blood sugars are stable without fluctuation with Tresiba compared to previous Lantus regimen ?Not doing much exercise ?Weight is again higher since her last 2 visits ? ?Side effects from medications:  Invokana: ? Headache.  Victoza ?  Swelling in neck ?     ?Results of CGM download from [facility] sensor with the interpretation for the last 2 weeks data as  follows ? ? ?Summary of patterns: Periods of hyperglycemia are present late afternoon and late evening, more consistently late evening; no hypoglycemia present and overnight sugars are usually lowest between 6-8 AM  ? ?Overnight blood sugars start off averaging around 200 at midnight and then continued to decline until about 6 AM with average around 115 for a couple of hours without hypoglycemia  ?No sensor data available partly on 2 of the days ?Premeal readings are difficult to assess as her mealtimes are variable ?Postprandial readings overall high but tend to fluctuate after her first meal which is around noon time; blood sugars are mostly higher after evening meal although Premeal readings are about 150-160 before eating  ? ? ?CGM use % of time 92  ?2-week average/GV 166  ?Time in range 69      %  ?% Time Above 180 23+8  ?% Time above 250   ?% Time Below 70 0  ? ?  ?PRE-MEAL Fasting Lunch Dinner Bedtime Overall  ?Glucose range:       ?Averages: 115      ? ?POST-MEAL PC Breakfast PC Lunch PC Dinner  ?Glucose range:     ?Averages:  204 209  ? ?Previously: ? ?CGM use % of time   ?2-week average/GV 167  ?Time in range      62% was 77  ?% Time Above 180 32  ?% Time above 250 6  ?% Time Below  70 0  ? ?  ?PRE-MEAL Fasting Lunch Dinner Bedtime Overall  ?Glucose range:   164    ?Averages: 124      ? ?POST-MEAL PC Breakfast PC Lunch PC Dinner  ?Glucose range:  207 209  ?Averages:     ? ? ?  ?  Meals: 2-3 meals per day. . Dinner 6-7 pm, variable quantity and carbohydrates ? ?Does have protein with breakfast consistently, has egg for protein ? ? ?Wt Readings from Last 3 Encounters:  ?03/07/22 220 lb 9.6 oz (100.1 kg)  ?12/21/21 223 lb (101.2 kg)  ?08/17/21 216 lb 9.6 oz (98.2 kg)  ? ?GLYCEMIC CONTROL: ? ?Lab Results  ?Component Value Date  ? HGBA1C 7.8 (A) 12/21/2021  ? HGBA1C 7.3 (A) 08/17/2021  ? HGBA1C 7.6 (A) 08/03/2020  ? ?Lab Results  ?Component Value Date  ? MICROALBUR <0.7 06/17/2021  ? [place] 92 12/24/2021  ?  CREATININE 1.10 (H) 12/24/2021  ? ?Lab Results  ?Component Value Date  ? FRUCTOSAMINE 345 (H) 03/30/2020  ? FRUCTOSAMINE 342 (H) 09/08/2017  ? ? ? ? Other problems discussed today: See review of systems ? ? ?Allergies as of 03/07/2022   ? ?   Reactions  ? Latex   ? Itching and breaking out  ? Pravastatin   ? Pt stated, "Made my muscle ache"  ? ?  ? ?  ?Medication List  ?  ? ?  ? Accurate as of March 07, 2022 11:59 PM. If you have any questions, ask your nurse or doctor.  ?  ?  ? ?  ? ?Accu-Chek Guide Me w/Device Kit ?Use to check blood sugar 2 times per day dx code E11.65 ?  ?Accu-Chek Guide test strip ?Generic drug: glucose blood ?USE TO TEST BLOOD SUGAR THREE TIMES DAILY AS DIRECTED. ?  ?B-D ULTRAFINE III SHORT PEN 31G X 8 MM Misc ?Generic drug: Insulin Pen Needle ?USE  2-3 TIMES A DAY. ?  ?benzonatate 100 MG capsule ?Commonly known as: TESSALON ?Take 100 mg by mouth every 8 (eight) hours. ?  ?Byetta 5 MCG Pen 5 MCG/0.02ML Sopn injection ?Generic drug: exenatide ?Inject 5 mcg into the skin 2 (two) times daily with a meal. ?  ?cloNIDine 0.1 MG tablet ?Commonly known as: CATAPRES ?TAKE (1) TABLET BY MOUTH TWICE DAILY. ?  ?fluvastatin XL 80 MG 24 hr tablet ?Commonly known as: Lescol XL ?Take 1 tablet (80 mg total) by mouth daily. ?  ?HumaLOG KwikPen 100 UNIT/ML KwikPen ?Generic drug: insulin lispro ?INJECT 6 TO 7 UNITS SUBCUTANEOUSLY BEFORE SUPPER IF GLUCOSE IS HIGH. ?  ?hydrochlorothiazide 25 MG tablet ?Commonly known as: HYDRODIURIL ?TAKE 1 TABLET BY MOUTH ONCE A DAY. ?  ?metFORMIN 850 MG tablet ?Commonly known as: GLUCOPHAGE ?TAKE (1) TABLET BY MOUTH TWICE DAILY. ?  ?metoprolol tartrate 50 MG tablet ?Commonly known as: LOPRESSOR ?TAKE 1/2 TABLET BY MOUTH TWICE DAILY. ?  ?multivitamin tablet ?Take 1 tablet by mouth once a week. ?  ?omeprazole 20 MG capsule ?Commonly known as: PRILOSEC ?Take 20 mg by mouth daily. ?  ?onetouch ultrasoft lancets ?  ?polyethylene glycol powder 17 GM/SCOOP powder ?Commonly known as:  GLYCOLAX/MIRALAX ?Take 1 Container by mouth as needed for moderate constipation. ?  ?simvastatin 20 MG tablet ?Commonly known as: ZOCOR ?Take 1 tablet (20 mg total) by mouth at bedtime. ?  ?Sure Comfort Insulin Syringe 31G X 5/16" 0.3 ML Misc ?Generic drug: Insulin Syringe-Needle U-100 ?USE AS DIRECTED 2-3 TIMES A DAY. ?  ?Tyler Aas FlexTouch 100 UNIT/ML  FlexTouch Pen ?Generic drug: insulin degludec ?INJECT 40 UNITS SUBCUTANEOUSLY ONCE EVERY MORNING. ?  ? ?  ? ? ?Allergies:  ?Allergies  ?Allergen Reactions  ? Latex   ?  Itching and breaking out  ? Pravastatin   ?  Pt stated, "Made my muscle ache"  ? ? ?Past Medical History:  ?Diagnosis Date  ? Atrial fibrillation ([place])   ? Paroxysmal; LVH; nl EF; onset in 1999  ? Burning with urination 02/23/2016  ? Diabetes mellitus   ? insulin; managed by Dr. Dwyane Dee  ? Diabetic Charcot's joint disease ([place])   ? left lower extremity  ? Hyperlipidemia   ? Hypertension   ? Hypothyroidism   ? history of goiter; nl TSH off medication  ? Itching with irritation 02/27/2015  ? Obesity 07/06/2012  ? Palpitations   ? negative event recorder in 2009  ? UTI (urinary tract infection) 02/23/2016  ? ? ?Past Surgical History:  ?Procedure Laterality Date  ? BIOPSY THYROID    ? CESAREAN SECTION    ? CHOLECYSTECTOMY  11/95  ? COLONOSCOPY  2007  ? RETINAL DETACHMENT SURGERY  2009  ? TUBAL LIGATION  1980's  ? ? ?Family History  ?Problem Relation Age of Onset  ? Diabetes Mother   ? Diabetes Daughter   ?     gestational diabetes  ? Cancer Father   ? Thyroid disease Brother   ? Other Son   ?     MVA  ? ? ?Social History:  reports that she quit smoking about 35 years ago. Her smoking use included cigarettes. She started smoking about 55 years ago. She smoked an average of 1 pack per day. She has never used smokeless tobacco. She reports that she does not drink alcohol and does not use drugs. ? ?Review of Systems: ? ? ?HYPERTENSION:  Previously followed by PCP, had been on Norvasc, MAXZIDE and Benicar ? ?Because of  high potassium and increased creatinine her Benicar and Maxzide had been stopped ?She is on the clonidine 0.1 mg bid along with HCTZ 25 mg daily, also on 50 mg Toprol ?Previously on the clonidine patch she had itchi

## 2022-03-08 MED ORDER — TIRZEPATIDE 2.5 MG/0.5ML ~~LOC~~ SOAJ: 2.5000 mg | SUBCUTANEOUS | 3 refills | Status: DC

## 2022-03-09 ENCOUNTER — Ambulatory Visit: Payer: Medicare Other | Admitting: Orthopaedic Surgery

## 2022-03-10 ENCOUNTER — Ambulatory Visit (INDEPENDENT_AMBULATORY_CARE_PROVIDER_SITE_OTHER): Payer: Medicare Other | Admitting: Sports Medicine

## 2022-03-10 ENCOUNTER — Ambulatory Visit (INDEPENDENT_AMBULATORY_CARE_PROVIDER_SITE_OTHER): Payer: Medicare Other

## 2022-03-10 ENCOUNTER — Encounter: Payer: Self-pay | Admitting: Sports Medicine

## 2022-03-10 DIAGNOSIS — M79675 Pain in left toe(s): Secondary | ICD-10-CM

## 2022-03-10 DIAGNOSIS — E1142 Type 2 diabetes mellitus with diabetic polyneuropathy: Secondary | ICD-10-CM

## 2022-03-10 DIAGNOSIS — M2041 Other hammer toe(s) (acquired), right foot: Secondary | ICD-10-CM

## 2022-03-10 DIAGNOSIS — L97521 Non-pressure chronic ulcer of other part of left foot limited to breakdown of skin: Secondary | ICD-10-CM

## 2022-03-10 DIAGNOSIS — M2141 Flat foot [pes planus] (acquired), right foot: Secondary | ICD-10-CM

## 2022-03-10 DIAGNOSIS — B351 Tinea unguium: Secondary | ICD-10-CM | POA: Diagnosis not present

## 2022-03-10 DIAGNOSIS — M2040 Other hammer toe(s) (acquired), unspecified foot: Secondary | ICD-10-CM

## 2022-03-10 DIAGNOSIS — L84 Corns and callosities: Secondary | ICD-10-CM

## 2022-03-10 DIAGNOSIS — M2042 Other hammer toe(s) (acquired), left foot: Secondary | ICD-10-CM

## 2022-03-10 DIAGNOSIS — E1165 Type 2 diabetes mellitus with hyperglycemia: Secondary | ICD-10-CM

## 2022-03-10 DIAGNOSIS — E11610 Type 2 diabetes mellitus with diabetic neuropathic arthropathy: Secondary | ICD-10-CM

## 2022-03-10 DIAGNOSIS — M79674 Pain in right toe(s): Secondary | ICD-10-CM

## 2022-03-10 NOTE — Progress Notes (Signed)
Subjective: ?Brittney [name] is a 76 y.o. female patient with history of diabetes who presents to office today complaining of long,mildly painful nails.  Reports that she has dry skin on left foot but otherwise is doing good. Some knee/hip pain and has to be referred to Ortho.  ?   ?Last PCP visit was 03/07/22.  ? ? ?Patient Active Problem List  ? Diagnosis Date Noted  ? UTI (urinary tract infection) 02/23/2016  ? Burning with urination 02/23/2016  ? Itching with irritation 02/27/2015  ? Goiter diffuse, adenomatous 02/05/2014  ? Diabetic polyneuropathy ([place]) 09/25/2013  ? Obesity 07/06/2012  ? 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 Subclinical hyperthyroidism 06/30/2009  ? Type II diabetes mellitus, uncontrolled 06/30/2009  ? Hyperlipidemia 06/30/2009  ? Essential hypertension, benign 06/30/2009  ? PAROXYSMAL ATRIAL FIBRILLATION 06/30/2009  ? ?Current Outpatient Medications on File Prior to Visit  ?Medication Sig Dispense Refill  ? ACCU-CHEK GUIDE test strip USE TO TEST BLOOD SUGAR THREE TIMES DAILY AS DIRECTED. 100 strip 2  ? benzonatate (TESSALON) 100 MG capsule Take 100 mg by mouth every 8 (eight) hours.    ? Blood Glucose Monitoring Suppl (ACCU-CHEK GUIDE ME) w/Device KIT Use to check blood sugar 2 times per day dx code E11.65 1 kit 0  ? cloNIDine (CATAPRES) 0.1 MG tablet TAKE (1) TABLET BY MOUTH TWICE DAILY. 180 tablet 0  ? fluvastatin XL (LESCOL XL) 80 MG 24 hr tablet Take 1 tablet (80 mg total) by mouth daily. 30 tablet 2  ? HUMALOG KWIKPEN 100 UNIT/ML KwikPen INJECT 6 TO 7 UNITS SUBCUTANEOUSLY BEFORE SUPPER IF GLUCOSE IS HIGH. 15 mL 3  ? hydrochlorothiazide (HYDRODIURIL) 25 MG tablet TAKE 1 TABLET BY MOUTH ONCE A DAY. 30 tablet 3  ? Insulin Pen Needle (B-D ULTRAFINE III SHORT PEN) 31G X 8 MM MISC USE  2-3 TIMES A DAY. 100 each 4  ? Lancets (ONETOUCH ULTRASOFT) lancets     ? metFORMIN (GLUCOPHAGE) 850 MG tablet TAKE (1) TABLET BY MOUTH TWICE DAILY. 60 tablet 0  ? metoprolol tartrate (LOPRESSOR) 50 MG  tablet TAKE 1/2 TABLET BY MOUTH TWICE DAILY. 30 tablet 3  ? Multiple Vitamin (MULTIVITAMIN) tablet Take 1 tablet by mouth once a week.     ? omeprazole (PRILOSEC) 20 MG capsule Take 20 mg by mouth daily.    ? polyethylene glycol powder (GLYCOLAX/MIRALAX) powder Take 1 Container by mouth as needed for moderate constipation.     ? simvastatin (ZOCOR) 20 MG tablet Take 1 tablet (20 mg total) by mouth at bedtime. 90 tablet 0  ? SURE COMFORT INSULIN SYRINGE 31G X 5/16" 0.3 ML MISC USE AS DIRECTED 2-3 TIMES A DAY. 100 each 0  ? tirzepatide (MOUNJARO) 2.5 MG/0.5ML Pen Inject 2.5 mg into the skin once a week. 2 mL 3  ? TRESIBA FLEXTOUCH 100 UNIT/ML FlexTouch Pen INJECT 40 UNITS SUBCUTANEOUSLY ONCE EVERY MORNING. 15 mL 0  ? ?No current facility-administered medications on file prior to visit.  ? ?Allergies  ?Allergen Reactions  ? Latex   ?  Itching and breaking out  ? Pravastatin   ?  Pt stated, "Made my muscle ache"  ? ? ?Recent Results (from the past 2160 hour(s))  ?POCT HgB A1C     Status: Abnormal  ? Collection Time: 12/21/21  9:28 AM  ?Result Value Ref Range  ? Hemoglobin A1C 7.8 (A) 4.0 - 5.6 %  ? HbA1c POC (<> result, manual entry)    ? HbA1c, POC (prediabetic range)    ?  HbA1c, POC (controlled diabetic range)    ?T3, free     Status: None  ? Collection Time: 12/24/21  9:59 AM  ?Result Value Ref Range  ? T3, Free 3.1 2.0 - 4.4 pg/mL  ?  Comment: (NOTE) ?Performed At: [facility] ?[address] [place], [place] [id number] ?Rush Farmer MD [id number] ?  ?TSH     Status: Abnormal  ? Collection Time: 12/24/21  9:59 AM  ?Result Value Ref Range  ? TSH <0.010 (L) 0.350 - 4.500 uIU/mL  ?  Comment: Performed at [facility], [address], [place], [place] [address]  ?Lipid panel     Status: None  ? Collection Time: 12/24/21  9:59 AM  ?Result Value Ref Range  ? Cholesterol 180 0 - 200 mg/dL  ? Triglycerides 115 <150 mg/dL  ? HDL 65 >40 mg/dL  ? Total CHOL/HDL Ratio 2.8 RATIO  ? VLDL 23 0 - 40 mg/dL  ? LDL  Cholesterol 92 0 - 99 mg/dL  ?  Comment:        ?Total Cholesterol/HDL:CHD Risk ?Coronary Heart Disease Risk Table ?                    Men   Women ? 1/2 Average Risk   3.4   3.3 ? Average Risk       5.0   4.4 ? 2 X Average Risk   9.6   7.1 ? 3 X Average Risk  23.4   11.0 ?       ?Use the calculated Patient Ratio ?above and the CHD Risk Table ?to determine the patient's CHD Risk. ?       ?ATP III CLASSIFICATION (LDL): ? <100     mg/dL   Optimal ? 100-129  mg/dL   Near or Above ?                   Optimal ? 130-159  mg/dL   Borderline ? 160-189  mg/dL   High ? >190     mg/dL   Very High ?Performed at [facility], [address] [place], [place] [address] ?  ?Comprehensive metabolic panel     Status: Abnormal  ? Collection Time: 12/24/21  9:59 AM  ?Result Value Ref Range  ? Sodium 136 135 - 145 mmol/L  ? Potassium 4.5 3.5 - 5.1 mmol/L  ? Chloride 102 98 - 111 mmol/L  ? CO2 26 22 - 32 mmol/L  ? Glucose, Bld 178 (H) 70 - 99 mg/dL  ?  Comment: Glucose reference range applies only to samples taken after fasting for at least 8 hours.  ? BUN 22 8 - 23 mg/dL  ? Creatinine, Ser 1.10 (H) 0.44 - 1.00 mg/dL  ? Calcium 9.5 8.9 - 10.3 mg/dL  ? Total Protein 7.8 6.5 - 8.1 g/dL  ? Albumin 4.0 3.5 - 5.0 g/dL  ? AST 13 (L) 15 - 41 U/L  ? ALT 12 0 - 44 U/L  ? Alkaline Phosphatase 96 38 - 126 U/L  ? Total Bilirubin 0.6 0.3 - 1.2 mg/dL  ? GFR, Estimated 53 (L) >60 mL/min  ?  Comment: (NOTE) ?Calculated using the CKD-EPI Creatinine Equation (2021) ?  ? Anion gap 8 5 - 15  ?  Comment: Performed at [facility], [address], [place], [place] [address]  ? ? ? ? ?Objective: ?General: Patient is awake, alert, and oriented x 3 and in no acute distress. ? ?Integument: Skin is warm,  dry and supple bilateral. Nails are tender, long, thickened and dystrophic with subungual debris, consistent with onychomycosis, 1-5 bilateral with small fragments of nails from previous removal. No signs of infection, no callusing.  Remaining integument  unremarkable. ? ?Neurovascular status: Subjective sharp shooting and tingling to the left greater than right foot likely consistent with neuropathy unchanged from prior.  Pedal pulses 1 out of 4 bilateral. ? ?Musculoskeletal: Asymptomatic Charcot on the left with hammertoe and pes planus pedal deformities noted bilateral. + Charcot on left.  Muscular strength 4/5 on left and 5/5 on right in all lower extremity muscular groups bilateral without pain on range of motion . No tenderness with calf compression bilateral. ? ?Assessment and Plan: ?Problem List Items Addressed This Visit   ? ?  ? Endocrine  ? Type II diabetes mellitus, uncontrolled  ? 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 Diabetic polyneuropathy ([place])  ? ?Other Visit Diagnoses   ? ? Pain due to onychomycosis of toenails of both feet    -  Primary  ? Callus of foot      ? ?  ? ?-Examined patient. ?-Re-Discussed and educated patient on diabetic foot care ?-Mechanically debrided all painful nails 1-5 bilateral using sterile nail nipper and filed with dremel without incident ?-Mechanically smoothed keratosis plantar left lateral foot using a sterile rotary bur without incident at no additional charge ?-Patient to see Aaron Edelman today for p/u diabetic shoes and offloading insoles for Charcot left foot ?-Answered all patient questions ?-Patient to return  in 3 months for at risk foot care with Dr. Elisha Ponder or sooner if problems arise ? ?Landis Martins, DPM ?

## 2022-03-10 NOTE — Progress Notes (Signed)
SITUATION ?Reason for Visit: Fitting of Diabetic [place] ?Patient / Caregiver Report:  Patient is satisfied with fit and function of shoes and insoles. ? ?OBJECTIVE DATA: ?Patient History / Diagnosis:   ?  ICD-10-CM   ?1. Diabetic polyneuropathy associated with type 2 diabetes mellitus ([place])  E11.42   ?  ?2. 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  ?3. Pes planus of both feet  M21.41   ? M21.42   ?  ?4. Hammer toe, unspecified laterality  M20.40   ?  ?5. Ulcer of left foot, limited to breakdown of skin ([place])  L97.521   ?  ? ? ?Change in Status:   None ? ?ACTIONS PERFORMED: ?In-Person Delivery, patient was fit with: ?- 1x pair [id number] PDAC approved prefabricated Diabetic Shoes: [id number] 295 27M ?- 3x pair [id number] PDAC approved vacuum formed custom diabetic insoles; [employer]: [id number] ? ?Shoes and insoles were verified for structural integrity and safety. Patient wore shoes and insoles in office. Skin was inspected and free of areas of concern after wearing shoes and inserts. Shoes and inserts fit properly. Patient / Caregiver provided with ferbal instruction and demonstration regarding donning, doffing, wear, care, proper fit, function, purpose, cleaning, and use of shoes and insoles ' and in all related precautions and risks and benefits regarding shoes and insoles. Patient / Caregiver was instructed to wear properly fitting socks with shoes at all times. Patient was also provided with verbal instruction regarding how to report any failures or malfunctions of shoes or inserts, and necessary follow up care. Patient / Caregiver was also instructed to contact physician regarding change in status that may affect function of shoes and inserts.  ? ?Patient / Caregiver verbalized undersatnding of instruction provided. Patient / Caregiver demonstrated independence with proper donning and doffing of shoes and inserts. ? ?PLAN ?Patient to follow with treating physician as recommended. Plan of care was  discussed with and agreed upon by patient and/or caregiver. All questions were answered and concerns addressed. ? ?

## 2022-03-14 ENCOUNTER — Telehealth: Payer: Self-pay

## 2022-03-14 NOTE — Telephone Encounter (Signed)
Patient left message saying she had questions regarding her medications.  Attempted to call patient back but no answer, left voicemail. ?

## 2022-03-18 ENCOUNTER — Other Ambulatory Visit: Payer: Self-pay | Admitting: Cardiology

## 2022-03-20 DIAGNOSIS — I129 Hypertensive chronic kidney disease with stage 1 through stage 4 chronic kidney disease, or unspecified chronic kidney disease: Secondary | ICD-10-CM | POA: Diagnosis not present

## 2022-03-20 DIAGNOSIS — E785 Hyperlipidemia, unspecified: Secondary | ICD-10-CM | POA: Diagnosis not present

## 2022-03-21 DIAGNOSIS — E1142 Type 2 diabetes mellitus with diabetic polyneuropathy: Secondary | ICD-10-CM | POA: Diagnosis not present

## 2022-03-21 DIAGNOSIS — I10 Essential (primary) hypertension: Secondary | ICD-10-CM | POA: Diagnosis not present

## 2022-03-21 DIAGNOSIS — N183 Chronic kidney disease, stage 3 unspecified: Secondary | ICD-10-CM | POA: Diagnosis not present

## 2022-03-21 DIAGNOSIS — M1712 Unilateral primary osteoarthritis, left knee: Secondary | ICD-10-CM | POA: Diagnosis not present

## 2022-03-21 DIAGNOSIS — E785 Hyperlipidemia, unspecified: Secondary | ICD-10-CM | POA: Diagnosis not present

## 2022-03-22 ENCOUNTER — Ambulatory Visit: Payer: Medicare Other | Admitting: Endocrinology

## 2022-03-22 ENCOUNTER — Encounter: Payer: Self-pay | Admitting: Endocrinology

## 2022-03-22 DIAGNOSIS — E119 Type 2 diabetes mellitus without complications: Secondary | ICD-10-CM | POA: Diagnosis not present

## 2022-03-22 DIAGNOSIS — Z794 Long term (current) use of insulin: Secondary | ICD-10-CM | POA: Diagnosis not present

## 2022-03-22 NOTE — Telephone Encounter (Signed)
Patient states that her neck has been hurting and also having a ear ache. Not sure if its coming from the [facility] or not but wanted you to know.  ?

## 2022-03-23 ENCOUNTER — Ambulatory Visit (INDEPENDENT_AMBULATORY_CARE_PROVIDER_SITE_OTHER): Payer: Medicare Other | Admitting: Orthopaedic Surgery

## 2022-03-23 ENCOUNTER — Ambulatory Visit (INDEPENDENT_AMBULATORY_CARE_PROVIDER_SITE_OTHER): Payer: Medicare Other

## 2022-03-23 ENCOUNTER — Encounter: Payer: Self-pay | Admitting: Orthopaedic Surgery

## 2022-03-23 VITALS — Ht 67.0 in | Wt 217.0 lb

## 2022-03-23 DIAGNOSIS — M25562 Pain in left knee: Secondary | ICD-10-CM

## 2022-03-23 MED ORDER — LIDOCAINE HCL 1 % IJ SOLN
3.0000 mL | INTRAMUSCULAR | Status: AC | PRN
  Administered 2022-03-23: 3 mL

## 2022-03-23 MED ORDER — METHYLPREDNISOLONE ACETATE 40 MG/ML IJ SUSP
40.0000 mg | INTRAMUSCULAR | Status: AC | PRN
  Administered 2022-03-23: 40 mg via INTRA_ARTICULAR

## 2022-03-23 NOTE — Progress Notes (Signed)
? ?Office Visit Note ?  ?Patient: Brittney [name]           ?Date of Birth: [birth year]           ?MRN: [id number] ?Visit Date: 03/23/2022 ?             ?Requested by: Iona Beard, MD ?[facility] ?[address] ?[place],  [place] [address] ?PCP: Iona Beard, MD ? ? ?Assessment & Plan: ?Visit Diagnoses:  ?1. Acute pain of left knee   ? ? 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  I felt it was worth trying a steroid injection today to temporize her pain since she is reporting better blood glucose control we can see if this will help her left knee pain.  I did show them her x-rays and talked about the possibility of knee replacement surgery in the future.  I would like to see her back in 4 weeks to see how she is doing overall and to see how she responds to the steroid injection.  She is not a candidate for hyaluronic acid given the severity of the arthritis.  I also would like to see how her hemoglobin A1c is trending. ? ?Follow-Up Instructions: Return in about 4 weeks (around 04/20/2022).  ? ?Orders:  ?Orders Placed This Encounter  ?Procedures  ? Large Joint Inj  ? XR Knee 1-2 Views Left  ? ?No orders of the defined types were placed in this encounter. ? ? ? ? Procedures: ?Large Joint Inj: L knee on 03/23/2022 1:32 PM ?Indications: diagnostic evaluation and pain ?Details: 22 G 1.5 in needle, superolateral approach ? ?Arthrogram: No ? ?Medications: 3 mL lidocaine 1 %; 40 mg methylPREDNISolone acetate 40 MG/ML ?Outcome: tolerated well, no immediate complications ?Procedure, treatment alternatives, risks and benefits explained, specific risks discussed. Consent was given by the patient. Immediately prior to procedure a time out was called to verify the correct patient, procedure, equipment, support staff and site/side marked as required. Patient was prepped and draped in the usual sterile fashion.  ? ? ? ? ?Clinical Data: ?No additional  findings. ? ? ?Subjective: ?Chief Complaint  ?Patient presents with  ? Left Knee - Pain  ?The patient is a 76 year old female that I am seeing for the first time for acute left knee pain that she says is only been hurting for about 3 weeks was also some swelling with no known injury.  She is a diabetic and I saw her last hemoglobin A1c in January was 7.8.  She says she is worked on better diabetic control and had her labs drawn yesterday.  That lab is not back yet.  She denies any specific injury and again says her knee hurts with weightbearing and this is only been a recent.  She does not walk with an assistive device. ? ?HPI ? ?Review of Systems ?She currently denies any fever, chills, nausea, vomiting ? ?Objective: ?Vital Signs: Ht '5\' 7"'$  (1.702 m)   Wt 217 lb (98.4 kg)   BMI 33.99 kg/m?  ? ?Physical Exam ?She is alert and orient x3 and in no acute distress ?Ortho Exam ?Examination of her left knee shows a lot of crepitation with flexion extension and global tenderness in all 3 compartments.  The knee is ligamentously stable but painful throughout arc of motion. ?Specialty Comments:  ?No specialty comments available. ? ?Imaging: ?XR Knee 1-2 Views Left ? ?Result Date: 03/23/2022 ?2 views  of the left knee show severe tricompartmental end-stage arthritis with bone-on-bone wear of all 3 compartments and large osteophytes in all 3 compartments.  There is really no joint space remaining much at all.  ? ? ?PMFS History: ?Patient Active Problem List  ? Diagnosis Date Noted  ? UTI (urinary tract infection) 02/23/2016  ? Burning with urination 02/23/2016  ? Itching with irritation 02/27/2015  ? Goiter diffuse, adenomatous 02/05/2014  ? Diabetic polyneuropathy ([place]) 09/25/2013  ? Obesity 07/06/2012  ? 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 Subclinical hyperthyroidism 06/30/2009  ? Type II diabetes mellitus, uncontrolled 06/30/2009  ? Hyperlipidemia 06/30/2009  ? Essential hypertension, benign 06/30/2009  ? PAROXYSMAL  ATRIAL FIBRILLATION 06/30/2009  ? ?Past Medical History:  ?Diagnosis Date  ? Atrial fibrillation ([place])   ? Paroxysmal; LVH; nl EF; onset in 1999  ? Burning with urination 02/23/2016  ? Diabetes mellitus   ? insulin; managed by Dr. Dwyane Dee  ? 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 left lower extremity  ? Hyperlipidemia   ? Hypertension   ? Hypothyroidism   ? history of goiter; nl TSH off medication  ? Itching with irritation 02/27/2015  ? Obesity 07/06/2012  ? Palpitations   ? negative event recorder in 2009  ? UTI (urinary tract infection) 02/23/2016  ?  ?Family History  ?Problem Relation Age of Onset  ? Diabetes Mother   ? Diabetes Daughter   ?     gestational diabetes  ? Cancer Father   ? Thyroid disease Brother   ? Other Son   ?     MVA  ?  ?Past Surgical History:  ?Procedure Laterality Date  ? BIOPSY THYROID    ? CESAREAN SECTION    ? CHOLECYSTECTOMY  11/95  ? COLONOSCOPY  2007  ? RETINAL DETACHMENT SURGERY  2009  ? TUBAL LIGATION  1980's  ? ?Social History  ? ?Occupational History  ? Occupation: Retired  ?Tobacco Use  ? Smoking status: Former  ?  Packs/day: 1.00  ?  Types: Cigarettes  ?  Start date: 11/21/1966  ?  Quit date: 08/13/1986  ?  Years since quitting: 35.6  ? Smokeless tobacco: Never  ?Vaping Use  ? Vaping Use: Never used  ?Substance and Sexual Activity  ? Alcohol use: No  ?  Alcohol/week: 0.0 standard drinks  ? Drug use: No  ? Sexual activity: Never  ?  Birth control/protection: Post-menopausal  ? ? ? ? ? ? ?

## 2022-03-28 NOTE — Telephone Encounter (Signed)
Attempted to call patient no answer. Left VM to give [place] a call back ?

## 2022-04-09 ENCOUNTER — Other Ambulatory Visit: Payer: Self-pay | Admitting: Endocrinology

## 2022-04-09 DIAGNOSIS — E1165 Type 2 diabetes mellitus with hyperglycemia: Secondary | ICD-10-CM

## 2022-04-19 ENCOUNTER — Other Ambulatory Visit: Payer: Self-pay | Admitting: Endocrinology

## 2022-04-19 ENCOUNTER — Other Ambulatory Visit: Payer: Self-pay

## 2022-04-19 DIAGNOSIS — E1165 Type 2 diabetes mellitus with hyperglycemia: Secondary | ICD-10-CM

## 2022-04-19 MED ORDER — TIRZEPATIDE 5 MG/0.5ML ~~LOC~~ SOAJ: 5.0000 mg | SUBCUTANEOUS | 0 refills | Status: DC

## 2022-04-20 ENCOUNTER — Telehealth: Payer: Self-pay

## 2022-04-20 ENCOUNTER — Ambulatory Visit (INDEPENDENT_AMBULATORY_CARE_PROVIDER_SITE_OTHER): Payer: Medicare Other | Admitting: Orthopaedic Surgery

## 2022-04-20 ENCOUNTER — Encounter: Payer: Self-pay | Admitting: Orthopaedic Surgery

## 2022-04-20 DIAGNOSIS — M25562 Pain in left knee: Secondary | ICD-10-CM | POA: Diagnosis not present

## 2022-04-20 DIAGNOSIS — M1712 Unilateral primary osteoarthritis, left knee: Secondary | ICD-10-CM

## 2022-04-20 NOTE — Progress Notes (Signed)
The patient comes in today for continued follow-up as it relates to the severe arthritis of her left knee.  She hurts still quite a bit on the medial aspect of that knee.  We did try a steroid injection recently.  She said it did help some.  She still having significant medial pain on the left knee.  She is 76 years old.  She is a diabetic and does report her blood sugars have been under better control.  Her last hemoglobin A1c that asked the was 7.8 but that was back in January.  She says she feels like it is below 7 as far she can remember.  We did talk in length in detail about other treatment options considering the pain she is having.  I talked about offloading her knee with a cane in her opposite hand.  Also stressed the importance of blood glucose control.  Her left knee does have varus malalignment that is not really correctable.  She has mild effusion.  There is significant medial joint line tenderness and patellofemoral tenderness.  The knee is ligamentously stable but painful throughout the arc of motion.  Her x-rays from a month ago show severe end-stage arthritis of the left knee.  I have recommended at least trying hyaluronic acid for her left knee.  I think this is reasonable to try this treatment route.  She agrees with this treatment plan.

## 2022-04-20 NOTE — Telephone Encounter (Signed)
Noted  

## 2022-04-20 NOTE — Telephone Encounter (Signed)
Please get auth for gel injection-left knee

## 2022-04-23 DIAGNOSIS — Z794 Long term (current) use of insulin: Secondary | ICD-10-CM | POA: Diagnosis not present

## 2022-04-23 DIAGNOSIS — E119 Type 2 diabetes mellitus without complications: Secondary | ICD-10-CM | POA: Diagnosis not present

## 2022-04-28 ENCOUNTER — Telehealth: Payer: Self-pay

## 2022-04-28 DIAGNOSIS — E1165 Type 2 diabetes mellitus with hyperglycemia: Secondary | ICD-10-CM

## 2022-04-28 DIAGNOSIS — E7800 Pure hypercholesterolemia, unspecified: Secondary | ICD-10-CM

## 2022-04-28 NOTE — Telephone Encounter (Signed)
Patient called and wants to know if she can go down to 10 mg for simvastatin. '20mg'$  is causing cramps. Please advise

## 2022-04-29 MED ORDER — SIMVASTATIN 10 MG PO TABS: 10.0000 mg | ORAL_TABLET | Freq: Every day | ORAL | 0 refills | Status: DC

## 2022-04-29 NOTE — Telephone Encounter (Signed)
Rx sent and vm left for patient.

## 2022-05-06 ENCOUNTER — Other Ambulatory Visit: Payer: Self-pay | Admitting: Cardiology

## 2022-05-09 ENCOUNTER — Ambulatory Visit: Payer: Medicare Other | Admitting: Endocrinology

## 2022-05-09 ENCOUNTER — Encounter: Payer: Self-pay | Admitting: Endocrinology

## 2022-05-09 ENCOUNTER — Ambulatory Visit (INDEPENDENT_AMBULATORY_CARE_PROVIDER_SITE_OTHER): Payer: Medicare Other | Admitting: Endocrinology

## 2022-05-09 VITALS — BP 118/78 | HR 97 | Ht 67.0 in | Wt 209.0 lb

## 2022-05-09 DIAGNOSIS — I10 Essential (primary) hypertension: Secondary | ICD-10-CM | POA: Diagnosis not present

## 2022-05-09 DIAGNOSIS — Z794 Long term (current) use of insulin: Secondary | ICD-10-CM | POA: Diagnosis not present

## 2022-05-09 DIAGNOSIS — E1165 Type 2 diabetes mellitus with hyperglycemia: Secondary | ICD-10-CM | POA: Diagnosis not present

## 2022-05-09 DIAGNOSIS — E0590 Thyrotoxicosis, unspecified without thyrotoxic crisis or storm: Secondary | ICD-10-CM | POA: Diagnosis not present

## 2022-05-09 MED ORDER — HYDROCHLOROTHIAZIDE 12.5 MG PO CAPS: 12.5000 mg | ORAL_CAPSULE | Freq: Every day | ORAL | 1 refills | Status: DC

## 2022-05-09 NOTE — Progress Notes (Signed)
Patient ID: Brittney [name], female   DOB: Feb 04, 1946, 76 y.o.   MRN: [id number]     Reason for Appointment: Follow-up visit  History of Present Illness    Type 2 DIABETES MELITUS, date of diagnosis: 1976      She has had long-standing diabetes taking insulin. Because of her difficulties with compliance using mealtime insulin she was given in [place] in 2011 and subsequently Victoza in addition to her Lantus and this helped her control However she tends to be erratic with her day-to-day care and has not had adequate A1c levels in the past, usually over 7%  RECENT history:   Insulin regimen: Tresiba  units daily in a.m. Humalog 4-7 units at times  Non-insulin hypoglycemic drugs: Mounjaro 5 mg weekly,   metformin  850 twice a day  Her A1c was last 7.8 Recent GMI 7.0  Current glucose patterns and problems: She [facility] However she thinks she has taken only 2 injections of the 5 mg Mounjaro 5 mg preceded by 2.5 mg weekly She has had increased satiety With this she is eating only small portions otherwise she gets very full Does not think her appetite is excessively suppressed or having any nausea However she has lost 8 pounds She is now afraid that her blood sugars are going to get low during the night and will eat some kind of snack at bedtime, last night ate a full sandwich  With this nighttime snack her blood sugar may go up slightly but she does not have any nocturnal low sugars In fact her blood sugars are only getting lower for an hour or 2 before her breakfast  TRESIBA dose has been reduced to 32 units, she was told to try 36 units with starting Mounjaro and she cut down another 4  Most of the time she is not taking her Humalog now She says that again she will likely take it only when her blood sugar goes up Recently she had significant rise in blood sugar with eating cereal without any Humalog coverage   Previously has had a tendency to gain weight  Side effects from medications:  Invokana: ? Headache.  Victoza ?  Swelling in neck      Results of CGM download from Dexcom sensor with the interpretation for the last 2 weeks data as follows   Summary of download: Her blood sugars are within the target range on an average at all times with HIGHEST blood sugars between 12 PM-2 PM and LOWEST blood sugars around 8-9 AM time  In the last 2 weeks highest average blood sugar is 175 and lowest 123  She has sporadic episodes of HYPERGLYCEMIA postprandially either after breakfast or lunch and rarely after dinner or overnight  Overnight blood sugars are generally averaging about 160 most of the night until early morning and fairly stable Postprandial readings are only occasionally high with hyperglycemic episodes as above  No hypoglycemia seen   CGM use % of time 93  2-week average/GV 154 versus 166  Time in range 82 versus 69      %  % Time Above 180 17+1  % Time above 250   % Time Below 70 0   Previous averages:  PRE-MEAL Fasting Lunch Dinner Bedtime Overall  Glucose range:       Averages: 115       POST-MEAL PC Breakfast PC Lunch PC Dinner  Glucose range:  Averages:  204 209         Meals: 2-3 meals per day. . Dinner 6-7 pm, variable quantity and carbohydrates  Does have protein with breakfast consistently, has egg for protein   Wt Readings from Last 3 Encounters:  05/09/22 209 lb (94.8 kg)  03/23/22 217 lb (98.4 kg)  03/07/22 220 lb 9.6 oz (100.1 kg)   GLYCEMIC CONTROL:  Lab Results  Component Value Date   HGBA1C 7.8 (A) 12/21/2021   HGBA1C 7.3 (A) 08/17/2021   HGBA1C 7.6 (A) 08/03/2020   Lab Results  Component Value Date   MICROALBUR <0.7 06/17/2021   LDLCALC 92 12/24/2021   CREATININE 1.10 (H) 12/24/2021   Lab Results  Component Value Date   FRUCTOSAMINE 345 (H) 03/30/2020   FRUCTOSAMINE 342 (H) 09/08/2017      Other problems discussed today: See  review of systems   Allergies as of 05/09/2022       Reactions   Latex    Itching and breaking out   Pravastatin    Pt stated, "Made my muscle ache"        Medication List        Accurate as of May 09, 2022  8:49 PM. If you have any questions, ask your nurse or doctor.          STOP taking these medications    hydrochlorothiazide 25 MG tablet Commonly known as: HYDRODIURIL Replaced by: hydrochlorothiazide 12.5 MG capsule Stopped by: Elayne Snare, MD       TAKE these medications    Accu-Chek Guide Me w/Device Kit Use to check blood sugar 2 times per day dx code E11.65   Accu-Chek Guide test strip Generic drug: glucose blood USE TO TEST BLOOD SUGAR THREE TIMES DAILY AS DIRECTED.   B-D ULTRAFINE III SHORT PEN 31G X 8 MM Misc Generic drug: Insulin Pen Needle USE  2-3 TIMES A DAY.   benzonatate 100 MG capsule Commonly known as: TESSALON Take 100 mg by mouth every 8 (eight) hours.   cloNIDine 0.1 MG tablet Commonly known as: CATAPRES TAKE (1) TABLET BY MOUTH TWICE DAILY.   fluvastatin XL 80 MG 24 hr tablet Commonly known as: Lescol XL Take 1 tablet (80 mg total) by mouth daily.   HumaLOG KwikPen 100 UNIT/ML KwikPen Generic drug: insulin lispro INJECT 6 TO 7 UNITS SUBCUTANEOUSLY BEFORE SUPPER IF GLUCOSE IS HIGH.   hydrochlorothiazide 12.5 MG capsule Commonly known as: MICROZIDE Take 1 capsule (12.5 mg total) by mouth daily. Replaces: hydrochlorothiazide 25 MG tablet Started by: Elayne Snare, MD   metFORMIN 850 MG tablet Commonly known as: GLUCOPHAGE TAKE (1) TABLET BY MOUTH TWICE DAILY.   metoprolol tartrate 50 MG tablet Commonly known as: LOPRESSOR TAKE 1/2 TABLET BY MOUTH TWICE DAILY.   multivitamin tablet Take 1 tablet by mouth once a week.   omeprazole 20 MG capsule Commonly known as: PRILOSEC Take 20 mg by mouth daily.   onetouch ultrasoft lancets   polyethylene glycol powder 17 GM/SCOOP powder Commonly known as: GLYCOLAX/MIRALAX Take  1 Container by mouth as needed for moderate constipation.   simvastatin 10 MG tablet Commonly known as: ZOCOR Take 1 tablet (10 mg total) by mouth at bedtime.   Sure Comfort Insulin Syringe 31G X 5/16" 0.3 ML Misc Generic drug: Insulin Syringe-Needle U-100 USE AS DIRECTED 2-3 TIMES A DAY.   tirzepatide 2.5 MG/0.5ML Pen Commonly known as: MOUNJARO Inject 2.5 mg into the skin once a week.   tirzepatide 5 MG/0.5ML Pen Commonly  known as: MOUNJARO Inject 5 mg into the skin once a week.   Tyler Aas FlexTouch 100 UNIT/ML FlexTouch Pen Generic drug: insulin degludec INJECT 40 UNITS SUBCUTANEOUSLY ONCE EVERY MORNING. What changed: See the new instructions.        Allergies:  Allergies  Allergen Reactions   Latex     Itching and breaking out   Pravastatin     Pt stated, "Made my muscle ache"    Past Medical History:  Diagnosis Date   Atrial fibrillation (HCC)    Paroxysmal; LVH; nl EF; onset in 1999   Burning with urination 02/23/2016   Diabetes mellitus    insulin; managed by Dr. 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   Hyperlipidemia    Hypertension    Hypothyroidism    history of goiter; nl TSH off medication   Itching with irritation 02/27/2015   Obesity 07/06/2012   Palpitations    negative event recorder in 2009   UTI (urinary tract infection) 02/23/2016    Past Surgical History:  Procedure Laterality Date   BIOPSY THYROID     CESAREAN SECTION     CHOLECYSTECTOMY  11/95   COLONOSCOPY  2007   RETINAL DETACHMENT SURGERY  2009   TUBAL LIGATION  1980's    Family History  Problem Relation Age of Onset   Diabetes Mother    Diabetes Daughter        gestational diabetes   Cancer Father    Thyroid disease Brother    Other Son        MVA    Social History:  reports that she quit smoking about 35 years ago. Her smoking use included cigarettes. She started smoking about 55 years ago. She smoked an average of 1 pack per day. She has  never used smokeless tobacco. She reports that she does not drink alcohol and does not use drugs.  Review of Systems:   HYPERTENSION:  Previously followed by PCP, had been on Norvasc, MAXZIDE and Benicar  Because of high potassium and increased creatinine her Benicar and Maxzide had been stopped She is on the clonidine 0.1 mg bid along with HCTZ 25 mg daily, also on 50 mg Toprol Previously on the clonidine patch she had itching at the site  Also checking at home periodically, recently 128/70  BP Readings from Last 3 Encounters:  05/09/22 118/78  03/07/22 122/64  12/21/21 122/72   Renal function: Stable but she thinks her kidney function is getting worse  Lab Results  Component Value Date   CREATININE 1.10 (H) 12/24/2021   CREATININE 0.92 10/22/2020   CREATININE 0.87 10/07/2020     HYPERLIPIDEMIA: The lipid abnormality consists of elevated LDL; she stopped her pravastatin because of symptoms of muscle aches and cramping She has reportedly taken her FLUVASTATIN    Lab Results  Component Value Date   CHOL 180 12/24/2021   HDL 65 12/24/2021   LDLCALC 92 12/24/2021   TRIG 115 12/24/2021   CHOLHDL 2.8 12/24/2021    MULTINODULAR GOITER: She has had a long-standing multinodular goiter since at least 2003  She does have history of occasional palpitations for years Metoprolol has been prescribed by cardiologist  She has no increased local pressure symptoms in the neck or swallowing difficulty However she thinks her neck is periodically swelling more She is reluctant to consider surgery  Her goiter has been autonomous with no evidence of overt hyperthyroidism  with normal free T4 and free  T3 levels; TSH has been consistently suppressed  Her I-131 uptake in 2003 was 17% with patchy uptake  her  thyroid ultrasound in 2014 did not show distinct nodules but a relative increase in size  Lab Results  Component Value Date   TSH <0.010 (L) 12/24/2021   TSH 0.00 (L) 06/17/2021    FREET4 0.92 06/17/2021   FREET4 1.07 10/07/2020   Lab Results  Component Value Date   T3FREE 3.1 12/24/2021   T3FREE 3.0 06/17/2021   T3FREE 3.7 10/07/2020       Foot exam done 12/21/2021 She has some numbness in her toes which is mild and chronic  She uses Aspercreme given by podiatrist  She has been using diabetic shoes  She is periodically seen by podiatrist   Examination:   BP 118/78   Pulse 97   Ht _0  (1.702 m)   Wt 209 lb (94.8 kg)   SpO2 99%   BMI 32.73 kg/m   Body mass index is 32.73 kg/m.   Exam not indicated  ASSESSMENT/ PLAN:  Diabetes Type 2 with obesity on insulin, [place] and metformin  See history of present illness for detailed discussion of  current management, blood sugar patterns and problems identified  Her A1c is last 7.8  She is not getting high postprandial readings except occasionally with adding Mounjaro to her basal insulin Again when she is eating more carbohydrate her blood sugars will go up such as with cereal Also she is able to lose weight and reduce her basal insulin by 8 units  Previously had been gaining weight but now is down 8 pounds She has no side effects with Mounjaro except for increased satiety   Recommendations:  Continue 5 mg Mounjaro Increase walking for exercise Make sure she takes Humalog if she is eating cereal or other high carbohydrate meal Stop eating bedtime snack as blood sugars are not going to get low overnight and explained the actions of Tresiba Reduce Tresiba to 30 units for now  HYPERTENSION: Blood pressure is relatively lower because of weight loss and decreased intake She can reduce HCTZ to 12.5 mg  We will recheck renal function and needs microalbumin     Patient Instructions  Tresiba 30 units  Hctz 1/2 daily    Elayne Snare 05/09/2022, 8:49 PM   Total visit time including counseling = 30 minutes

## 2022-05-09 NOTE — Patient Instructions (Addendum)
Tresiba 30 units  Hctz 1/2 daily

## 2022-05-10 LAB — BASIC METABOLIC PANEL
BUN: 21 mg/dL (ref 6–23)
CO2: 28 mEq/L (ref 19–32)
Calcium: 9.8 mg/dL (ref 8.4–10.5)
Chloride: 103 mEq/L (ref 96–112)
Creatinine, Ser: 1.49 mg/dL — ABNORMAL HIGH (ref 0.40–1.20)
GFR: 34.27 mL/min — ABNORMAL LOW (ref >60.00)
Glucose, Bld: 136 mg/dL — ABNORMAL HIGH (ref 70–99)
Potassium: 4.6 mEq/L (ref 3.5–5.1)
Sodium: 138 mEq/L (ref 135–145)

## 2022-05-10 LAB — TSH: TSH: <0.01 u[IU]/mL — ABNORMAL LOW (ref 0.35–5.50)

## 2022-05-10 LAB — T4, FREE: Free T4: 1.32 ng/dL (ref 0.60–1.60)

## 2022-05-10 LAB — T3, FREE: T3, Free: 3.5 pg/mL (ref 2.3–4.2)

## 2022-05-14 DIAGNOSIS — Z794 Long term (current) use of insulin: Secondary | ICD-10-CM | POA: Diagnosis not present

## 2022-05-14 DIAGNOSIS — E119 Type 2 diabetes mellitus without complications: Secondary | ICD-10-CM | POA: Diagnosis not present

## 2022-05-17 ENCOUNTER — Encounter: Payer: Self-pay | Admitting: Endocrinology

## 2022-05-18 ENCOUNTER — Telehealth: Payer: Self-pay

## 2022-05-18 NOTE — Telephone Encounter (Signed)
VOB submitted for Durolane, left knee. °BV pending. °

## 2022-05-20 ENCOUNTER — Other Ambulatory Visit: Payer: Self-pay

## 2022-05-20 DIAGNOSIS — E1165 Type 2 diabetes mellitus with hyperglycemia: Secondary | ICD-10-CM

## 2022-05-20 MED ORDER — TRESIBA FLEXTOUCH 100 UNIT/ML ~~LOC~~ SOPN: PEN_INJECTOR | SUBCUTANEOUS | 0 refills | Status: DC

## 2022-05-23 DIAGNOSIS — E785 Hyperlipidemia, unspecified: Secondary | ICD-10-CM | POA: Diagnosis not present

## 2022-05-23 DIAGNOSIS — I129 Hypertensive chronic kidney disease with stage 1 through stage 4 chronic kidney disease, or unspecified chronic kidney disease: Secondary | ICD-10-CM | POA: Diagnosis not present

## 2022-05-23 DIAGNOSIS — I10 Essential (primary) hypertension: Secondary | ICD-10-CM | POA: Diagnosis not present

## 2022-05-23 DIAGNOSIS — Z0000 Encounter for general adult medical examination without abnormal findings: Secondary | ICD-10-CM | POA: Diagnosis not present

## 2022-05-23 DIAGNOSIS — E1122 Type 2 diabetes mellitus with diabetic chronic kidney disease: Secondary | ICD-10-CM | POA: Diagnosis not present

## 2022-05-23 DIAGNOSIS — N183 Chronic kidney disease, stage 3 unspecified: Secondary | ICD-10-CM | POA: Diagnosis not present

## 2022-05-25 ENCOUNTER — Ambulatory Visit: Payer: Medicare Other | Admitting: Endocrinology

## 2022-05-25 ENCOUNTER — Other Ambulatory Visit: Payer: Self-pay

## 2022-05-25 DIAGNOSIS — E1165 Type 2 diabetes mellitus with hyperglycemia: Secondary | ICD-10-CM

## 2022-05-25 MED ORDER — TIRZEPATIDE 5 MG/0.5ML ~~LOC~~ SOAJ: 5.0000 mg | SUBCUTANEOUS | 1 refills | Status: DC

## 2022-05-27 ENCOUNTER — Other Ambulatory Visit: Payer: Self-pay | Admitting: Endocrinology

## 2022-05-27 DIAGNOSIS — E1165 Type 2 diabetes mellitus with hyperglycemia: Secondary | ICD-10-CM

## 2022-05-30 ENCOUNTER — Other Ambulatory Visit: Payer: Self-pay

## 2022-05-30 DIAGNOSIS — E1165 Type 2 diabetes mellitus with hyperglycemia: Secondary | ICD-10-CM

## 2022-05-30 MED ORDER — TRESIBA FLEXTOUCH 100 UNIT/ML ~~LOC~~ SOPN: PEN_INJECTOR | SUBCUTANEOUS | 3 refills | Status: DC

## 2022-06-07 ENCOUNTER — Ambulatory Visit: Payer: Medicare Other | Admitting: Endocrinology

## 2022-06-14 ENCOUNTER — Ambulatory Visit (INDEPENDENT_AMBULATORY_CARE_PROVIDER_SITE_OTHER): Payer: Medicare Other | Admitting: Podiatry

## 2022-06-14 ENCOUNTER — Encounter: Payer: Self-pay | Admitting: Podiatry

## 2022-06-14 DIAGNOSIS — L84 Corns and callosities: Secondary | ICD-10-CM | POA: Diagnosis not present

## 2022-06-14 DIAGNOSIS — E1142 Type 2 diabetes mellitus with diabetic polyneuropathy: Secondary | ICD-10-CM | POA: Diagnosis not present

## 2022-06-14 DIAGNOSIS — E782 Mixed hyperlipidemia: Secondary | ICD-10-CM

## 2022-06-14 DIAGNOSIS — E119 Type 2 diabetes mellitus without complications: Secondary | ICD-10-CM | POA: Diagnosis not present

## 2022-06-14 DIAGNOSIS — M79674 Pain in right toe(s): Secondary | ICD-10-CM

## 2022-06-14 DIAGNOSIS — Z794 Long term (current) use of insulin: Secondary | ICD-10-CM | POA: Diagnosis not present

## 2022-06-14 DIAGNOSIS — B351 Tinea unguium: Secondary | ICD-10-CM

## 2022-06-14 DIAGNOSIS — M79675 Pain in left toe(s): Secondary | ICD-10-CM | POA: Diagnosis not present

## 2022-06-14 DIAGNOSIS — I10 Essential (primary) hypertension: Secondary | ICD-10-CM

## 2022-06-16 ENCOUNTER — Ambulatory Visit: Payer: Medicare Other | Admitting: General Practice

## 2022-06-16 NOTE — Progress Notes (Addendum)
Office Visit    Patient Name: Brittney [name] Date of Encounter: 06/20/2022  PCP:  Iona Beard, [place]  Cardiologist:  Carlyle Dolly, MD  Advanced Practice Provider:  No care team member to display Electrophysiologist:  None  Chief Complaint    Brittney [name] is a 76 y.o. female with a past history significant for hypertension, DM, HLD, remote paroxysmal atrial fibrillation (never been anticoagulated) presents today for follow-up visit.  She saw Dr. Harl Bowie 10/21 and her blood pressure was elevated.  Asked to check her blood pressure at home and it kept increasing so added HCTZ 25 mg once daily.  Follow-up labs are normal.  She was last seen 03/2021 for follow-up.  BP initially came down but patient had not checked recently.  She had a large goiter which had worsened on [place] which she stopped.  She occasionally had palpitations at that time which would wake her up at all the night.  Today, she states that she is now taking simvastatin per her primary.  She has another follow-up appointment in August with her PCP and hopefully at that time they will repeat lipid panel and a BMP.  She was taken off HCTZ due to her renal function.  She has been on clonidine once a day although it is prescribed twice a day.  Blood pressure slightly elevated today in the clinic.  We have asked her to keep track of her blood pressure and let us know what her home values are.  Otherwise, doing well from a CV standpoint.  Reports no shortness of breath nor dyspnea on exertion. Reports no chest pain, pressure, or tightness. No edema, orthopnea, PND. Reports no palpitations.    Past Medical History    Past Medical History:  Diagnosis Date   Atrial fibrillation ([place])    Paroxysmal; LVH; nl EF; onset in 1999   Burning with urination 02/23/2016   Diabetes mellitus    insulin; managed by Dr. 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    Hyperlipidemia    Hypertension    Hypothyroidism    history of goiter; nl TSH off medication   Itching with irritation 02/27/2015   Obesity 07/06/2012   Palpitations    negative event recorder in 2009   UTI (urinary tract infection) 02/23/2016   Past Surgical History:  Procedure Laterality Date   BIOPSY THYROID     CESAREAN SECTION     CHOLECYSTECTOMY  11/95   COLONOSCOPY  2007   RETINAL DETACHMENT SURGERY  2009   TUBAL LIGATION  1980's    Allergies  Allergies  Allergen Reactions   Latex     Itching and breaking out   Pravastatin     Pt stated, "Made my muscle ache"     EKGs/Labs/Other Studies Reviewed:   The following studies were reviewed today:  Zio monitor 04/27/2021  Rare supraventricular ectopy. Rare SVT longest lasting 14 seconds Rare ventricular ectopy. Rare episodes of NSVT up to 13 beats No symptoms reported  EKG:  EKG is  ordered today.  The ekg ordered today demonstrates normal sinus rhythm, rate 89 bpm.  Recent Labs: 12/24/2021: ALT 12 05/09/2022: BUN 21; Creatinine, Ser 1.49; Potassium 4.6; Sodium 138; TSH <0.01  Recent Lipid Panel    Component Value Date/Time   CHOL 180 12/24/2021 0959   TRIG 115 12/24/2021 0959   HDL 65 12/24/2021 0959   CHOLHDL 2.8 12/24/2021 0959  VLDL 23 12/24/2021 0959   LDLCALC 92 12/24/2021 0959   Home Medications   Current Meds  Medication Sig   ACCU-CHEK GUIDE test strip USE TO TEST BLOOD SUGAR THREE TIMES DAILY AS DIRECTED.   Blood Glucose Monitoring Suppl (ACCU-CHEK GUIDE ME) w/Device KIT Use to check blood sugar 2 times per day dx code E11.65   cloNIDine (CATAPRES) 0.1 MG tablet TAKE (1) TABLET BY MOUTH TWICE DAILY.   HUMALOG KWIKPEN 100 UNIT/ML KwikPen INJECT 6 TO 7 UNITS SUBCUTANEOUSLY BEFORE SUPPER IF GLUCOSE IS HIGH.   insulin degludec (TRESIBA FLEXTOUCH) 100 UNIT/ML FlexTouch Pen INJECT 30 UNITS SUBCUTANEOUSLY ONCE EVERY MORNING.   Insulin Pen Needle (B-D ULTRAFINE III SHORT PEN) 31G X 8 MM MISC USE  2-3 TIMES A  DAY.   Lancets (ONETOUCH ULTRASOFT) lancets    Multiple Vitamin (MULTIVITAMIN) tablet Take 1 tablet by mouth once a week.    omeprazole (PRILOSEC) 20 MG capsule Take 20 mg by mouth daily.   polyethylene glycol powder (GLYCOLAX/MIRALAX) powder Take 1 Container by mouth as needed for moderate constipation.    simvastatin (ZOCOR) 10 MG tablet Take 1 tablet (10 mg total) by mouth at bedtime.   SURE COMFORT INSULIN SYRINGE 31G X 5/16" 0.3 ML MISC USE AS DIRECTED 2-3 TIMES A DAY.   tirzepatide [facility]) 5 MG/0.5ML Pen Inject 5 mg into the skin once a week.   [DISCONTINUED] benzonatate (TESSALON) 100 MG capsule Take 100 mg by mouth every 8 (eight) hours.   [DISCONTINUED] hydrochlorothiazide (MICROZIDE) 12.5 MG capsule Take 1 capsule (12.5 mg total) by mouth daily.   [DISCONTINUED] metFORMIN (GLUCOPHAGE) 850 MG tablet TAKE (1) TABLET BY MOUTH TWICE DAILY.   [DISCONTINUED] metoprolol tartrate (LOPRESSOR) 50 MG tablet TAKE 1/2 TABLET BY MOUTH TWICE DAILY.   [DISCONTINUED] tirzepatide [facility]) 2.5 MG/0.5ML Pen Inject 2.5 mg into the skin once a week.     Review of Systems      All other systems reviewed and are otherwise negative except as noted above.  Physical Exam    VS:  BP (!) 142/70   Pulse 89   Ht 5' 7"  (1.702 m)   Wt 213 lb (96.6 kg)   SpO2 98%   BMI 33.36 kg/m  , BMI Body mass index is 33.36 kg/m.  Wt Readings from Last 3 Encounters:  06/20/22 213 lb (96.6 kg)  05/09/22 209 lb (94.8 kg)  03/23/22 217 lb (98.4 kg)     GEN: Well nourished, well developed, in no acute distress. HEENT: normal. Neck: Supple, no JVD, Goiter present Cardiac: RRR, no murmurs, rubs, or gallops. No clubbing, cyanosis, 1-2 nonpitting lower ext edema.  Radials/PT 2+ and equal bilaterally.  Respiratory:  Respirations regular and unlabored, clear to auscultation bilaterally. GI: Soft, nontender, nondistended. MS: No deformity or atrophy. Skin: Warm and dry, no rash. Neuro:  Strength and sensation are  intact. Psych: Normal affect.  Assessment & Plan    Paroxysmal atrial fibrillation -ZIO monitor 04/27/2021 without any atrial fibrillation.  Rare episodes of SVT and NSVT -Metoprolol increased to 50 mg twice daily -less palpitations   Essential hypertension -continue to take at home -Slightly high today in the clinic -She was only taking half of her dose of clonidine  Hyperlipidemia -Repeat lipid panel in August during follow-up appointment -Recently started on simvastatin and tolerating  Uncontrolled type 2 diabetes mellitus -doing a lot better per patient  Diabetic Charcot's joint disease -Limits her activity -Encouraged to participate in 30 minutes of exercise 5 days a week.  Discussed starting with 10 minutes a day.  Class II severe obesity -foot issues are limiting -She will try to increase walking to at least 10 minutes daily     Disposition: Follow up 1 year with Carlyle Dolly, MD or APP.  Signed, Elgie Collard, PA-C 06/20/2022, 12:30 PM [facility]

## 2022-06-17 ENCOUNTER — Other Ambulatory Visit (HOSPITAL_COMMUNITY): Payer: Self-pay | Admitting: Family Medicine

## 2022-06-17 DIAGNOSIS — Z1231 Encounter for screening mammogram for malignant neoplasm of breast: Secondary | ICD-10-CM

## 2022-06-19 NOTE — Progress Notes (Signed)
  Subjective:  Patient ID: Brittney [name], female    DOB: February 24, 1946,  MRN: [id number]  Brittney [name] presents to clinic today for at risk foot care with history of diabetic neuropathy and painful thick toenails that are difficult to trim. Pain interferes with ambulation. Aggravating factors include wearing enclosed shoe gear. Pain is relieved with periodic professional debridement.  Patient states blood glucose was 104 mg/dl today.  Last known HgA1c was unknown.    New problem(s): None.   PCP is Brittney Beard, MD , and last visit was  January 18, 2022  Allergies  Allergen Reactions   Latex     Itching and breaking out   Pravastatin     Pt stated, "Made my muscle ache"    Review of Systems: Negative except as noted in the HPI.  Objective: No changes noted in today's physical examination. Brittney [name] is a pleasant 76 y.o. female, WD, WN in NAD. AAO x 3.  Vascular Examination: CFT <4 seconds b/l. DP/PT pulses faintly palpable b/l. Skin temperature gradient warm to warm b/l. No ischemia or gangrene. No cyanosis or clubbing noted b/l. Pedal hair sparse. No edema noted b/l LE.   Neurological Examination: Sensation grossly intact b/l with 10 gram monofilament. Vibratory sensation intact b/l. Pt has subjective symptoms of neuropathy.  Dermatological Examination: Pedal skin warm and supple b/l. Toenails 1-5 b/l thick, discolored, elongated with subungual debris and pain on dorsal palpation.  Hyperkeratotic lesion(s) plantarlateral aspect of midfoot left foot.  No erythema, no edema, no drainage, no fluctuance.  Musculoskeletal Examination: Muscle strength 5/5 to all LE muscle groups of right lower extremity. Muscle strength 4/5to all LE muscle groups of left lower extremity. Charcot deformity noted left lower extremity.  Radiographs: None  Last A1c:      Latest Ref Rng & Units 12/21/2021    9:28 AM 08/17/2021    9:31 AM  Hemoglobin A1C  Hemoglobin-A1c 4.0 - 5.6 % 7.8  7.3     Assessment/Plan: 1. Pain due to onychomycosis of toenails of both feet   2. Callus   3. Essential hypertension, benign   4. Mixed hyperlipidemia   5. Diabetic polyneuropathy associated with type 2 diabetes mellitus ([place])     -Patient was evaluated and treated. All patient's and/or POA's questions/concerns answered on today's visit. -Patient flagged in [place] for Limb at Risk. Ordered noninvasive arterial studies ABIs with and without TBIs for b/l lower extremities. -Continue foot and shoe inspections daily. Monitor blood glucose per PCP/Endocrinologist's recommendations. -Patient to continue soft, supportive shoe gear daily. -Toenails 1-5 b/l were debrided in length and girth with sterile nail nippers and dremel without iatrogenic bleeding.  -Callus(es) plantarlateral aspect of midfoot left foot pared utilizing mandrel sander without complication or incident. Total number debrided =1. -Patient/POA to call should there be question/concern in the interim.   Return in about 3 months (around 09/14/2022).  Mar[place]etta Board, DPM

## 2022-06-20 ENCOUNTER — Ambulatory Visit (INDEPENDENT_AMBULATORY_CARE_PROVIDER_SITE_OTHER): Payer: Medicare Other | Admitting: Physician Assistant

## 2022-06-20 ENCOUNTER — Encounter: Payer: Self-pay | Admitting: Physician Assistant

## 2022-06-20 VITALS — BP 142/70 | HR 89 | Ht 67.0 in | Wt 213.0 lb

## 2022-06-20 DIAGNOSIS — E11610 Type 2 diabetes mellitus with diabetic neuropathic arthropathy: Secondary | ICD-10-CM | POA: Diagnosis not present

## 2022-06-20 DIAGNOSIS — Z79899 Other long term (current) drug therapy: Secondary | ICD-10-CM | POA: Diagnosis not present

## 2022-06-20 DIAGNOSIS — I10 Essential (primary) hypertension: Secondary | ICD-10-CM | POA: Diagnosis not present

## 2022-06-20 DIAGNOSIS — E11649 Type 2 diabetes mellitus with hypoglycemia without coma: Secondary | ICD-10-CM

## 2022-06-20 DIAGNOSIS — E782 Mixed hyperlipidemia: Secondary | ICD-10-CM

## 2022-06-20 DIAGNOSIS — R002 Palpitations: Secondary | ICD-10-CM

## 2022-06-20 DIAGNOSIS — I480 Paroxysmal atrial fibrillation: Secondary | ICD-10-CM

## 2022-06-20 NOTE — Patient Instructions (Signed)
Medication Instructions:  Your physician recommends that you continue on your current medications as directed. Please refer to the Current Medication list given to you today.  *If you need a refill on your cardiac medications before your next appointment, please call your pharmacy*   Lab Work: Fasting lipids and BMET at your PCP If you have labs (blood work) drawn today and your tests are completely normal, you will receive your results only by: [place] (if you have MyChart) OR A paper copy in the mail If you have any lab test that is abnormal or we need to change your treatment, we will call you to review the results.  Follow-Up: At [facility], you and your health needs are our priority.  As part of our continuing mission to provide you with exceptional heart care, we have created designated Provider Care Teams.  These Care Teams include your primary Cardiologist (physician) and Advanced Practice Providers (APPs -  Physician Assistants and Nurse Practitioners) who all work together to provide you with the care you need, when you need it.  Your next appointment:   1 year(s)  The format for your next appointment:   In Person  Provider:   Merridy Pascoe Dolly, MD     Other Instructions TAKE YOUR BLOOD PRESSURE DAILY FOR TWO WEEKS AND CALL us WITH A LIST ON YOUR READINGS  Blood Pressure Record Sheet To take your blood pressure, you will need a blood pressure machine. You may be prescribed one, or you can buy a blood pressure machine (blood pressure monitor) at your clinic, drug store, or online. When choosing one, look for these features: An automatic monitor that has an arm cuff. A cuff that wraps snugly, but not too tightly, around your upper arm. You should be able to fit only one finger between your arm and the cuff. A device that stores blood pressure reading results. Do not choose a monitor that measures your blood pressure from your wrist or finger. Follow your health  care provider's instructions for how to take your blood pressure. To use this form: Get one reading in the morning (a.m.) before you take any medicines. Get one reading in the evening (p.m.) before supper. Take at least two readings with each blood pressure check. This makes sure the results are correct. Wait 1-2 minutes between measurements. Write down the results in the spaces on this form. Repeat this once a week, or as told by your health care provider. Make a follow-up appointment with your health care provider to discuss the results. Blood pressure log Date: _______________________ a.m. _____________________(1st reading) _____________________(2nd reading) p.m. _____________________(1st reading) _____________________(2nd reading) Date: _______________________ a.m. _____________________(1st reading) _____________________(2nd reading) p.m. _____________________(1st reading) _____________________(2nd reading) Date: _______________________ a.m. _____________________(1st reading) _____________________(2nd reading) p.m. _____________________(1st reading) _____________________(2nd reading) Date: _______________________ a.m. _____________________(1st reading) _____________________(2nd reading) p.m. _____________________(1st reading) _____________________(2nd reading) Date: _______________________ a.m. _____________________(1st reading) _____________________(2nd reading) p.m. _____________________(1st reading) _____________________(2nd reading) This information is not intended to replace advice given to you by your health care provider. Make sure you discuss any questions you have with your health care provider. Document Revised: 07/22/2021 Document Reviewed: 07/22/2021 Elsevier Patient Education  [place].    Exercising to Stay Healthy To become healthy and stay healthy, it is recommended that you do moderate-intensity and vigorous-intensity exercise. You can tell that you are  exercising at a moderate intensity if your heart starts beating faster and you start breathing faster but can still hold a conversation. You can tell that you are exercising  at a vigorous intensity if you are breathing much harder and faster and cannot hold a conversation while exercising. How can exercise benefit me? Exercising regularly is important. It has many health benefits, such as: Improving overall fitness, flexibility, and endurance. Increasing bone density. Helping with weight control. Decreasing body fat. Increasing muscle strength and endurance. Reducing stress and tension, anxiety, depression, or anger. Improving overall health. What guidelines should I follow while exercising? Before you start a new exercise program, talk with your health care provider. Do not exercise so much that you hurt yourself, feel dizzy, or get very short of breath. Wear comfortable clothes and wear shoes with good support. Drink plenty of water while you exercise to prevent dehydration or heat stroke. Work out until your breathing and your heartbeat get faster (moderate intensity). How often should I exercise? Choose an activity that you enjoy, and set realistic goals. Your health care provider can help you make an activity plan that is individually designed and works best for you. Exercise regularly as told by your health care provider. This may include: Doing strength training two times a week, such as: Lifting weights. Using resistance bands. Push-ups. Sit-ups. Yoga. Doing a certain intensity of exercise for a given amount of time. Choose from these options: A total of 150 minutes of moderate-intensity exercise every week. A total of 75 minutes of vigorous-intensity exercise every week. A mix of moderate-intensity and vigorous-intensity exercise every week. Children, pregnant women, people who have not exercised regularly, people who are overweight, and older adults may need to talk with a  health care provider about what activities are safe to perform. If you have a medical condition, be sure to talk with your health care provider before you start a new exercise program. What are some exercise ideas? Moderate-intensity exercise ideas include: Walking 1 mile (1.6 km) in about 15 minutes. Biking. Hiking. Golfing. Dancing. Water aerobics. Vigorous-intensity exercise ideas include: Walking 4.5 miles (7.2 km) or more in about 1 hour. Jogging or running 5 miles (8 km) in about 1 hour. Biking 10 miles (16.1 km) or more in about 1 hour. Lap swimming. Roller-skating or in-line skating. Cross-country skiing. Vigorous competitive sports, such as football, basketball, and soccer. Jumping rope. Aerobic dancing. What are some everyday activities that can help me get exercise? Yard work, such as: Psychologist, educational. Raking and bagging leaves. Washing your car. Pushing a stroller. Shoveling snow. Gardening. Washing windows or floors. How can I be more active in my day-to-day activities? Use stairs instead of an elevator. Take a walk during your lunch break. If you drive, park your car farther away from your work or school. If you take public transportation, get off one stop early and walk the rest of the way. Stand up or walk around during all of your indoor phone calls. Get up, stretch, and walk around every 30 minutes throughout the day. Enjoy exercise with a friend. Support to continue exercising will help you keep a regular routine of activity. Where to find more information You can find more information about exercising to stay healthy from: U.S. Department of Health and Human Services: [employer].at Centers for Disease Control and Prevention (CDC): http://www.wolf.info/ Summary Exercising regularly is important. It will improve your overall fitness, flexibility, and endurance. Regular exercise will also improve your overall health. It can help you control your weight, reduce  stress, and improve your bone density. Do not exercise so much that you hurt yourself, feel dizzy, or get very short of  breath. Before you start a new exercise program, talk with your health care provider. This information is not intended to replace advice given to you by your health care provider. Make sure you discuss any questions you have with your health care provider. Document Revised: 03/05/2021 Document Reviewed: 03/05/2021 Elsevier Patient Education  [place].

## 2022-06-21 NOTE — Addendum Note (Signed)
Addended by: Janan Halter F on: 06/21/2022 04:15 PM   Modules accepted: Orders

## 2022-06-22 ENCOUNTER — Ambulatory Visit (HOSPITAL_COMMUNITY)
Admission: RE | Admit: 2022-06-22 | Payer: Medicare Other | Source: Ambulatory Visit | Attending: Podiatry | Admitting: Podiatry

## 2022-06-23 ENCOUNTER — Other Ambulatory Visit: Payer: Self-pay | Admitting: Cardiology

## 2022-06-29 ENCOUNTER — Ambulatory Visit (HOSPITAL_COMMUNITY)
Admission: RE | Admit: 2022-06-29 | Discharge: 2022-06-29 | Disposition: A | Discharge status: Home / Self Care | Payer: Medicare Other | Source: Ambulatory Visit | Attending: Family Medicine | Admitting: Family Medicine

## 2022-06-29 DIAGNOSIS — Z1231 Encounter for screening mammogram for malignant neoplasm of breast: Secondary | ICD-10-CM | POA: Insufficient documentation

## 2022-06-30 ENCOUNTER — Inpatient Hospital Stay (HOSPITAL_COMMUNITY): Admission: RE | Admit: 2022-06-30 | Payer: Medicare Other | Source: Ambulatory Visit

## 2022-07-04 ENCOUNTER — Other Ambulatory Visit (HOSPITAL_COMMUNITY): Payer: Self-pay | Admitting: Family Medicine

## 2022-07-04 DIAGNOSIS — R928 Other abnormal and inconclusive findings on diagnostic imaging of breast: Secondary | ICD-10-CM

## 2022-07-05 ENCOUNTER — Ambulatory Visit (HOSPITAL_COMMUNITY)
Admission: RE | Admit: 2022-07-05 | Discharge: 2022-07-05 | Disposition: A | Discharge status: Home / Self Care | Payer: Medicare Other | Source: Ambulatory Visit | Attending: Internal Medicine | Admitting: Internal Medicine

## 2022-07-05 DIAGNOSIS — M79605 Pain in left leg: Secondary | ICD-10-CM

## 2022-07-05 DIAGNOSIS — E782 Mixed hyperlipidemia: Secondary | ICD-10-CM | POA: Diagnosis not present

## 2022-07-05 DIAGNOSIS — I10 Essential (primary) hypertension: Secondary | ICD-10-CM | POA: Insufficient documentation

## 2022-07-05 DIAGNOSIS — E1142 Type 2 diabetes mellitus with diabetic polyneuropathy: Secondary | ICD-10-CM | POA: Insufficient documentation

## 2022-07-06 ENCOUNTER — Telehealth (INDEPENDENT_AMBULATORY_CARE_PROVIDER_SITE_OTHER): Payer: Medicare Other | Admitting: Podiatry

## 2022-07-06 DIAGNOSIS — R6889 Other general symptoms and signs: Secondary | ICD-10-CM

## 2022-07-06 DIAGNOSIS — E11610 Type 2 diabetes mellitus with diabetic neuropathic arthropathy: Secondary | ICD-10-CM

## 2022-07-06 DIAGNOSIS — I10 Essential (primary) hypertension: Secondary | ICD-10-CM

## 2022-07-06 DIAGNOSIS — E782 Mixed hyperlipidemia: Secondary | ICD-10-CM

## 2022-07-06 NOTE — Telephone Encounter (Signed)
Referral notes were faxed over to Vascular surgery

## 2022-07-06 NOTE — Telephone Encounter (Signed)
I phoned patient informing her about results of her ABIs. Test revealed decreased blood flow to left lower extremity. She is currently without any wounds, rest pain or claudication. I will refer to Vascular Surgery for evaluation. Patient related understanding.

## 2022-07-11 ENCOUNTER — Ambulatory Visit (HOSPITAL_COMMUNITY)
Admission: RE | Admit: 2022-07-11 | Discharge: 2022-07-11 | Disposition: A | Discharge status: Home / Self Care | Payer: Medicare Other | Source: Ambulatory Visit | Attending: Family Medicine | Admitting: Family Medicine

## 2022-07-11 DIAGNOSIS — E785 Hyperlipidemia, unspecified: Secondary | ICD-10-CM | POA: Diagnosis not present

## 2022-07-11 DIAGNOSIS — I10 Essential (primary) hypertension: Secondary | ICD-10-CM | POA: Diagnosis not present

## 2022-07-11 DIAGNOSIS — R928 Other abnormal and inconclusive findings on diagnostic imaging of breast: Secondary | ICD-10-CM | POA: Diagnosis not present

## 2022-07-11 DIAGNOSIS — B353 Tinea pedis: Secondary | ICD-10-CM | POA: Diagnosis not present

## 2022-07-11 DIAGNOSIS — N183 Chronic kidney disease, stage 3 unspecified: Secondary | ICD-10-CM | POA: Diagnosis not present

## 2022-07-11 DIAGNOSIS — E1122 Type 2 diabetes mellitus with diabetic chronic kidney disease: Secondary | ICD-10-CM | POA: Diagnosis not present

## 2022-07-13 ENCOUNTER — Encounter: Payer: Self-pay | Admitting: Vascular Surgery

## 2022-07-13 ENCOUNTER — Ambulatory Visit (INDEPENDENT_AMBULATORY_CARE_PROVIDER_SITE_OTHER): Payer: Medicare Other | Admitting: Vascular Surgery

## 2022-07-13 VITALS — BP 154/72 | HR 67 | Temp 97.8°F | Resp 20 | Ht 67.0 in | Wt 214.0 lb

## 2022-07-13 DIAGNOSIS — I739 Peripheral vascular disease, unspecified: Secondary | ICD-10-CM | POA: Diagnosis not present

## 2022-07-13 NOTE — Progress Notes (Signed)
Patient ID: Brittney [name], female   DOB: 29-Nov-1945, 76 y.o.   MRN: [id number]  Reason for Consult: New Patient (Initial Visit)   Referred by Iona Beard, MD  Subjective:     HPI:  Brittney [name] is a 76 y.o. female with history of left great toe ulceration that subsequently healed.  She does have hyperlipidemia, hypertension and diabetes.  She does have claudication but this occurs after pretty long distance of walking.  No further wounds.  She states that her ulcer was from wearing shoes that were too tight several years ago at that time she did have a palpable pulse per her physical exam performed in our office.  She denies any history of stroke, TIA or amaurosis.  No personal or family history of aneurysm disease.  She is a former smoker but quit many years ago.  She does not take aspirin but she is on a statin.  Past Medical History:  Diagnosis Date   Atrial fibrillation (HCC)    Paroxysmal; LVH; nl EF; onset in 1999   Burning with urination 02/23/2016   Diabetes mellitus    insulin; managed by Dr. 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   Hyperlipidemia    Hypertension    Hypothyroidism    history of goiter; nl TSH off medication   Itching with irritation 02/27/2015   Obesity 07/06/2012   Palpitations    negative event recorder in 2009   UTI (urinary tract infection) 02/23/2016   Family History  Problem Relation Age of Onset   Diabetes Mother    Diabetes Daughter        gestational diabetes   Cancer Father    Thyroid disease Brother    Other Son        MVA   Past Surgical History:  Procedure Laterality Date   BIOPSY THYROID     CESAREAN SECTION     CHOLECYSTECTOMY  11/95   COLONOSCOPY  2007   RETINAL DETACHMENT SURGERY  2009   TUBAL LIGATION  1980's    Short Social History:  Social History   Tobacco Use   Smoking status: Former    Packs/day: 1.00    Types: Cigarettes    Start date: 11/21/1966    Quit date: 08/13/1986     Years since quitting: 35.9   Smokeless tobacco: Never  Substance Use Topics   Alcohol use: No    Alcohol/week: 0.0 standard drinks of alcohol    Allergies  Allergen Reactions   Latex     Itching and breaking out   Pravastatin     Pt stated, "Made my muscle ache"    Current Outpatient Medications  Medication Sig Dispense Refill   ACCU-CHEK GUIDE test strip USE TO TEST BLOOD SUGAR THREE TIMES DAILY AS DIRECTED. 100 strip 1   Blood Glucose Monitoring Suppl (ACCU-CHEK GUIDE ME) w/Device KIT Use to check blood sugar 2 times per day dx code E11.65 1 kit 0   cloNIDine (CATAPRES) 0.1 MG tablet TAKE (1) TABLET BY MOUTH TWICE DAILY. 180 tablet 0   HUMALOG KWIKPEN 100 UNIT/ML KwikPen INJECT 6 TO 7 UNITS SUBCUTANEOUSLY BEFORE SUPPER IF GLUCOSE IS HIGH. 15 mL 3   insulin degludec (TRESIBA FLEXTOUCH) 100 UNIT/ML FlexTouch Pen INJECT 30 UNITS SUBCUTANEOUSLY ONCE EVERY MORNING. 15 mL 3   Insulin Pen Needle (B-D ULTRAFINE III SHORT PEN) 31G X 8 MM MISC USE  2-3 TIMES A DAY. 100 each 4  Lancets (ONETOUCH ULTRASOFT) lancets      metoprolol tartrate (LOPRESSOR) 50 MG tablet TAKE 1/2 TABLET BY MOUTH TWICE DAILY. 90 tablet 2   Multiple Vitamin (MULTIVITAMIN) tablet Take 1 tablet by mouth once a week.      omeprazole (PRILOSEC) 20 MG capsule Take 20 mg by mouth daily.     polyethylene glycol powder (GLYCOLAX/MIRALAX) powder Take 1 Container by mouth as needed for moderate constipation.      simvastatin (ZOCOR) 10 MG tablet Take 1 tablet (10 mg total) by mouth at bedtime. 90 tablet 0   SURE COMFORT INSULIN SYRINGE 31G X 5/16" 0.3 ML MISC USE AS DIRECTED 2-3 TIMES A DAY. 100 each 0   tirzepatide (MOUNJARO) 5 MG/0.5ML Pen Inject 5 mg into the skin once a week. 6 mL 1   No current facility-administered medications for this visit.    Review of Systems  Constitutional:  Constitutional negative. HENT: HENT negative.  Eyes: Eyes negative.  Cardiovascular: Positive for claudication.  GI: Gastrointestinal  negative.  Musculoskeletal: Musculoskeletal negative.  Skin: Skin negative.  Neurological: Neurological negative. Hematologic: Hematologic/lymphatic negative.  Psychiatric: Psychiatric negative.        Objective:  Objective   Vitals:   07/13/22 0931  BP: (!) 154/72  Pulse: 67  Resp: 20  Temp: 97.8 F (36.6 C)  SpO2: 98%  Weight: 214 lb (97.1 kg)  Height: 5' 7" (1.702 m)   Body mass index is 33.52 kg/m.  Physical Exam HENT:     Head: Normocephalic.     Nose: Nose normal.  Eyes:     Pupils: Pupils are equal, round, and reactive to light.  Neck:     Vascular: No carotid bruit.     Comments: Large goiter Cardiovascular:     Pulses:          Femoral pulses are 2+ on the right side and 2+ on the left side.      Popliteal pulses are 2+ on the right side and 0 on the left side.       Dorsalis pedis pulses are 2+ on the right side and 0 on the left side.       Posterior tibial pulses are 0 on the right side and 0 on the left side.  Pulmonary:     Effort: Pulmonary effort is normal.  Abdominal:     General: Abdomen is flat.     Palpations: Abdomen is soft.  Musculoskeletal:        General: Normal range of motion.     Cervical back: Normal range of motion.     Right lower leg: No edema.     Left lower leg: No edema.  Skin:    General: Skin is warm.     Capillary Refill: Capillary refill takes less than 2 seconds.  Neurological:     General: No focal deficit present.     Mental Status: She is alert.  Psychiatric:        Mood and Affect: Mood normal.        Behavior: Behavior normal.        Thought Content: Thought content normal.     Data: ABI Findings:  +---------+------------------+-----+--------+--------+  Right    Rt Pressure (mmHg)IndexWaveformComment   +---------+------------------+-----+--------+--------+  Brachial 182                                      +---------+------------------+-----+--------+--------+  PTA      178                0.98 biphasic          +---------+------------------+-----+--------+--------+  PERO     180               0.99 biphasic          +---------+------------------+-----+--------+--------+  DP       193               1.06 biphasic          +---------+------------------+-----+--------+--------+  Great Toe165               0.91 Normal            +---------+------------------+-----+--------+--------+   +---------+------------------+-----+----------+----------------------------  ---+  Left     Lt Pressure (mmHg)IndexWaveform  Comment                           +---------+------------------+-----+----------+----------------------------  ---+  Brachial 175                                                                 +---------+------------------+-----+----------+----------------------------  ---+  PTA      137               0.75 monophasictriphasic flow on 06/26/2019  exam  +---------+------------------+-----+----------+----------------------------  ---+  PERO     119               0.65 monophasic                                   +---------+------------------+-----+----------+----------------------------  ---+  DP       68                0.37 monophasic                                   +---------+------------------+-----+----------+----------------------------  ---+  Great Toe63                0.35 Abnormal                                     +---------+------------------+-----+----------+----------------------------  ---+   +-------+-----------+-----------+------------+------------+  ABI/TBIToday's ABIToday's TBIPrevious ABIPrevious TBI  +-------+-----------+-----------+------------+------------+  Right  1.06       .91        .92         1.08          +-------+-----------+-----------+------------+------------+  Left   .75        .35        1.01        .76            +-------+-----------+-----------+------------+------------+        TOES Findings:  +----------+---------------+--------+-------+  Right ToesPressure (mmHg)WaveformComment  +----------+---------------+--------+-------+  1st Digit                Normal           +----------+---------------+--------+-------+  2nd Digit                Normal           +----------+---------------+--------+-------+  3rd Digit                Normal           +----------+---------------+--------+-------+  4th Digit                Normal           +----------+---------------+--------+-------+  5th Digit                Normal           +----------+---------------+--------+-------+       +---------+---------------+--------+-----------------+  Left ToesPressure (mmHg)WaveformComment            +---------+---------------+--------+-----------------+  1st Digit               Abnormal                   +---------+---------------+--------+-----------------+  2nd Digit               Abnormal                   +---------+---------------+--------+-----------------+  3rd Digit               Abnormal                   +---------+---------------+--------+-----------------+  4th Digit               Abnormal                   +---------+---------------+--------+-----------------+  5th Digit               Abnormalbarely detectable  +---------+---------------+--------+-----------------+          Assessment/Plan:    76 year old female with 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  At this time I recommended starting aspirin along with her statin and continuing the walking program and following up with me in 1 year with repeat ABIs.     Waynetta Sandy MD [facility]

## 2022-07-18 ENCOUNTER — Encounter: Payer: Medicare Other | Admitting: Obstetrics & Gynecology

## 2022-07-20 ENCOUNTER — Encounter (HOSPITAL_COMMUNITY): Payer: Medicare Other

## 2022-07-20 ENCOUNTER — Other Ambulatory Visit (HOSPITAL_COMMUNITY): Payer: Medicare Other

## 2022-07-27 ENCOUNTER — Ambulatory Visit: Payer: Medicare Other | Admitting: Orthopaedic Surgery

## 2022-08-01 DIAGNOSIS — E119 Type 2 diabetes mellitus without complications: Secondary | ICD-10-CM | POA: Diagnosis not present

## 2022-08-01 DIAGNOSIS — Z794 Long term (current) use of insulin: Secondary | ICD-10-CM | POA: Diagnosis not present

## 2022-08-08 ENCOUNTER — Encounter: Payer: Medicare Other | Admitting: Adult Health

## 2022-08-08 ENCOUNTER — Encounter (INDEPENDENT_AMBULATORY_CARE_PROVIDER_SITE_OTHER): Payer: Medicaid Other | Admitting: Ophthalmology

## 2022-08-09 ENCOUNTER — Encounter: Payer: Self-pay | Admitting: Endocrinology

## 2022-08-09 ENCOUNTER — Ambulatory Visit (INDEPENDENT_AMBULATORY_CARE_PROVIDER_SITE_OTHER): Payer: Medicare Other | Admitting: Endocrinology

## 2022-08-09 VITALS — BP 146/80 | HR 89 | Ht 67.0 in | Wt 214.5 lb

## 2022-08-09 DIAGNOSIS — I10 Essential (primary) hypertension: Secondary | ICD-10-CM

## 2022-08-09 DIAGNOSIS — E1165 Type 2 diabetes mellitus with hyperglycemia: Secondary | ICD-10-CM | POA: Diagnosis not present

## 2022-08-09 DIAGNOSIS — Z794 Long term (current) use of insulin: Secondary | ICD-10-CM

## 2022-08-09 DIAGNOSIS — E0590 Thyrotoxicosis, unspecified without thyrotoxic crisis or storm: Secondary | ICD-10-CM

## 2022-08-09 DIAGNOSIS — E7800 Pure hypercholesterolemia, unspecified: Secondary | ICD-10-CM | POA: Diagnosis not present

## 2022-08-09 LAB — T4, FREE: Free T4: 0.95 ng/dL (ref 0.60–1.60)

## 2022-08-09 LAB — LIPID PANEL
Cholesterol: 152 mg/dL (ref 0–200)
HDL: 58.2 mg/dL (ref >39.00)
LDL Cholesterol: 65 mg/dL (ref 0–99)
NonHDL: 93.56
Total CHOL/HDL Ratio: 3
Triglycerides: 142 mg/dL (ref 0.0–149.0)
VLDL: 28.4 mg/dL (ref 0.0–40.0)

## 2022-08-09 LAB — T3, FREE: T3, Free: 2.8 pg/mL (ref 2.3–4.2)

## 2022-08-09 LAB — COMPREHENSIVE METABOLIC PANEL
ALT: 8 U/L (ref 0–35)
AST: 12 U/L (ref 0–37)
Albumin: 3.7 g/dL (ref 3.5–5.2)
Alkaline Phosphatase: 101 U/L (ref 39–117)
BUN: 13 mg/dL (ref 6–23)
CO2: 28 mEq/L (ref 19–32)
Calcium: 9.2 mg/dL (ref 8.4–10.5)
Chloride: 104 mEq/L (ref 96–112)
Creatinine, Ser: 0.91 mg/dL (ref 0.40–1.20)
GFR: 61.83 mL/min (ref >60.00)
Glucose, Bld: 158 mg/dL — ABNORMAL HIGH (ref 70–99)
Potassium: 4.4 mEq/L (ref 3.5–5.1)
Sodium: 138 mEq/L (ref 135–145)
Total Bilirubin: 0.3 mg/dL (ref 0.2–1.2)
Total Protein: 7 g/dL (ref 6.0–8.3)

## 2022-08-09 LAB — TSH: TSH: <0.01 u[IU]/mL — ABNORMAL LOW (ref 0.35–5.50)

## 2022-08-09 LAB — POCT GLYCOSYLATED HEMOGLOBIN (HGB A1C): Hemoglobin A1C: 6.7 % — AB (ref 4.0–5.6)

## 2022-08-09 MED ORDER — HYDROCHLOROTHIAZIDE 12.5 MG PO CAPS: 12.5000 mg | ORAL_CAPSULE | Freq: Every day | ORAL | 1 refills | Status: DC

## 2022-08-09 NOTE — Patient Instructions (Signed)
Take Humalog when eating Starchy foods at lunch like sandwiches or pasta

## 2022-08-09 NOTE — Progress Notes (Signed)
Patient ID: Brittney [name], female   DOB: [birth year], 76 y.o.   MRN: [id number]     Reason for Appointment: Follow-up visit  History of Present Illness    Type 2 DIABETES MELITUS, date of diagnosis: 1976      She has had long-standing diabetes taking insulin. Because of her difficulties with compliance using mealtime insulin she was given in [place] in 2011 and subsequently Victoza in addition to her Lantus and this helped her control However she tends to be erratic with her day-to-day care and has not had adequate A1c levels in the past, usually over 7%  RECENT history:   Insulin regimen: Tresiba 30 units daily in a.m. Humalog 4-7 units at times  Non-insulin hypoglycemic drugs: Mounjaro 5 mg weekly,   metformin  850 twice a day  Her A1c 6.7, previously 7.8   Current glucose patterns and problems: She is continuing to get good results from [facility] with better A1c Also has been starting to walk about a mile at a time a few days a week She only has occasional postprandial hyperglycemia now likely to be from using Mounjaro As before she is concerned about blood sugars getting low and will not take Humalog proactively before eating large meals or with more carbohydrate at lunch when she has the highest readings She is still has some increased satiety with Mounjaro Currently still using the Dexcom consistently with good results She is again afraid that her blood sugars are going to get low during the night and will eat some kind of snack at bedtime Weight is the same  Side effects from medications:  Invokana: ? Headache.  Victoza ?  Swelling in neck      Results of CGM download from [facility] sensor with the interpretation for the last 2 weeks data as follows   Summary of blood sugar patterns:  Her blood sugars are generally well controlled especially overnight with some fluctuation in her blood sugars during the day and tendency to high readings early afternoon and late  evening  Overnight blood sugars are starting of nearly 180 average at midnight and then gradually decreasing However FASTING blood sugar is still averaging about 130+  POSTPRANDIAL readings are variable after lunch with some readings well over 200 but not consistently Also has a trend of blood sugars going up after about 10 PM but not necessarily after dinner  No hypoglycemia seen  CGM use % of time 86  2-week average/GV 151  Time in range       86 %  % Time Above 180 17  % Time above 250   % Time Below 70 0     PRE-MEAL Fasting Lunch Dinner Bedtime Overall  Glucose range:       Averages: 135       POST-MEAL PC Breakfast PC Lunch PC Dinner  Glucose range:     Averages:  175    Prior     CGM use % of time 93  2-week average/GV 154 versus 166  Time in range 82 versus 69      %  % Time Above 180 17+1  % Time above 250   % Time Below 70 0   Previous averages:  PRE-MEAL Fasting Lunch Dinner Bedtime Overall  Glucose range:       Averages: 115       POST-MEAL PC Breakfast PC Lunch PC Dinner  Glucose range:     Averages:  204 209  Meals: 2-3 meals per day. . Dinner 6-7 pm, variable quantity and carbohydrates  Does have protein with breakfast consistently, has egg for protein   Wt Readings from Last 3 Encounters:  08/09/22 214 lb 8 oz (97.3 kg)  07/13/22 214 lb (97.1 kg)  06/20/22 213 lb (96.6 kg)   GLYCEMIC CONTROL:  Lab Results  Component Value Date   HGBA1C 7.8 (A) 12/21/2021   HGBA1C 7.3 (A) 08/17/2021   HGBA1C 7.6 (A) 08/03/2020   Lab Results  Component Value Date   MICROALBUR <0.7 06/17/2021   LDLCALC 92 12/24/2021   CREATININE 1.49 (H) 05/09/2022   Lab Results  Component Value Date   FRUCTOSAMINE 345 (H) 03/30/2020   FRUCTOSAMINE 342 (H) 09/08/2017      Other problems discussed today: See review of systems   Allergies as of 08/09/2022       Reactions   Latex    Itching and breaking out   Pravastatin    Pt stated, "Made my  muscle ache"        Medication List        Accurate as of August 09, 2022  9:45 AM. If you have any questions, ask your nurse or doctor.          Accu-Chek Guide Me w/Device Kit Use to check blood sugar 2 times per day dx code E11.65   Accu-Chek Guide test strip Generic drug: glucose blood USE TO TEST BLOOD SUGAR THREE TIMES DAILY AS DIRECTED.   B-D ULTRAFINE III SHORT PEN 31G X 8 MM Misc Generic drug: Insulin Pen Needle USE  2-3 TIMES A DAY.   cloNIDine 0.1 MG tablet Commonly known as: CATAPRES TAKE (1) TABLET BY MOUTH TWICE DAILY.   HumaLOG KwikPen 100 UNIT/ML KwikPen Generic drug: insulin lispro INJECT 6 TO 7 UNITS SUBCUTANEOUSLY BEFORE SUPPER IF GLUCOSE IS HIGH.   metoprolol tartrate 50 MG tablet Commonly known as: LOPRESSOR TAKE 1/2 TABLET BY MOUTH TWICE DAILY.   multivitamin tablet Take 1 tablet by mouth once a week.   omeprazole 20 MG capsule Commonly known as: PRILOSEC Take 20 mg by mouth daily.   onetouch ultrasoft lancets   polyethylene glycol powder 17 GM/SCOOP powder Commonly known as: GLYCOLAX/MIRALAX Take 1 Container by mouth as needed for moderate constipation.   simvastatin 10 MG tablet Commonly known as: ZOCOR Take 1 tablet (10 mg total) by mouth at bedtime.   Sure Comfort Insulin Syringe 31G X 5/16" 0.3 ML Misc Generic drug: Insulin Syringe-Needle U-100 USE AS DIRECTED 2-3 TIMES A DAY.   tirzepatide 5 MG/0.5ML Pen Commonly known as: MOUNJARO Inject 5 mg into the skin once a week.   Tyler Aas FlexTouch 100 UNIT/ML FlexTouch Pen Generic drug: insulin degludec INJECT 30 UNITS SUBCUTANEOUSLY ONCE EVERY MORNING.        Allergies:  Allergies  Allergen Reactions   Latex     Itching and breaking out   Pravastatin     Pt stated, "Made my muscle ache"    Past Medical History:  Diagnosis Date   Atrial fibrillation (HCC)    Paroxysmal; LVH; nl EF; onset in 1999   Burning with urination 02/23/2016   Diabetes mellitus     insulin; managed by Dr. 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   Hyperlipidemia    Hypertension    Hypothyroidism    history of goiter; nl TSH off medication   Itching with irritation 02/27/2015   Obesity 07/06/2012   Palpitations  negative event recorder in 2009   UTI (urinary tract infection) 02/23/2016    Past Surgical History:  Procedure Laterality Date   BIOPSY THYROID     CESAREAN SECTION     CHOLECYSTECTOMY  11/95   COLONOSCOPY  2007   RETINAL DETACHMENT SURGERY  2009   TUBAL LIGATION  1980's    Family History  Problem Relation Age of Onset   Diabetes Mother    Diabetes Daughter        gestational diabetes   Cancer Father    Thyroid disease Brother    Other Son        MVA    Social History:  reports that she quit smoking about 36 years ago. Her smoking use included cigarettes. She started smoking about 55 years ago. She smoked an average of 1 pack per day. She has never used smokeless tobacco. She reports that she does not drink alcohol and does not use drugs.  Review of Systems:   HYPERTENSION:  Previously followed by PCP, had been on Norvasc, MAXZIDE and Benicar  Because of high potassium and increased creatinine her Benicar and Maxzide had been stopped She is on the clonidine 0.1 mg bid along, also on 50 mg Toprol She was told to reduce HCTZ to half tablet but she is not taking this now Previously on the clonidine patch she had itching at the site  Also checking at home periodically, recently appears to be higher with range overall 126-150/70-90, not clear how often she checks  BP Readings from Last 3 Encounters:  08/09/22 (!) 146/80  07/13/22 (!) 154/72  06/20/22 (!) 142/70   Renal function: Apparently had better results when seen by PCP in August but no reports available  Lab Results  Component Value Date   CREATININE 1.49 (H) 05/09/2022   CREATININE 1.10 (H) 12/24/2021   CREATININE 0.92 10/22/2020      HYPERLIPIDEMIA: The lipid abnormality consists of elevated LDL; she stopped her pravastatin because of symptoms of muscle aches and cramping Now taking simvastatin 2/7 days a week without side effects   Lab Results  Component Value Date   CHOL 180 12/24/2021   HDL 65 12/24/2021   LDLCALC 92 12/24/2021   TRIG 115 12/24/2021   CHOLHDL 2.8 12/24/2021    MULTINODULAR GOITER: She has had a long-standing multinodular goiter since at least 2003  She does have history of occasional palpitations for years Metoprolol has been prescribed by cardiologist  She has no increased local pressure symptoms in the neck or swallowing difficulty She is reluctant to consider surgery  Her goiter has been autonomous with no evidence of overt hyperthyroidism  with normal free T4 and free T3 levels; TSH has been consistently suppressed  Her I-131 uptake in 2003 was 17% with patchy uptake  Previously thyroid ultrasound in 2014 did not show distinct nodules but a relative increase in size  Lab Results  Component Value Date   TSH <0.01 (L) 05/09/2022   TSH <0.010 (L) 12/24/2021   FREET4 1.32 05/09/2022   FREET4 0.92 06/17/2021   Lab Results  Component Value Date   T3FREE 3.5 05/09/2022   T3FREE 3.1 12/24/2021   T3FREE 3.0 06/17/2021       Foot exam done 12/21/2021 She has some numbness in her toes which is mild and chronic  She uses Aspercreme given by podiatrist  She has been using diabetic shoes  She is periodically seen by podiatrist   Examination:   BP (!) 146/80 (BP Location:  Left Arm, Patient Position: Sitting, Cuff Size: Normal)   Pulse 89   Ht 5' 7"  (1.702 m)   Wt 214 lb 8 oz (97.3 kg)   SpO2 100%   BMI 33.60 kg/m   Body mass index is 33.6 kg/m.     ASSESSMENT/ PLAN:  Diabetes Type 2 with obesity on insulin, Mounjaro and metformin  See history of present illness for detailed discussion of  current management, blood sugar patterns and problems identified  Her A1c is  now excellent at 6.7  She is doing well with Mounjaro and mostly basal insulin However as before she is getting hypoglycemic when eating higher carbohydrate foods especially at lunchtime Discussed need to take 4 to 6 units of Humalog proactively for higher carbohydrate meals Also reassured that on she will not get low overnight with current dose of Tresiba and will have adequate morning from the [facility] She can stop getting extra snacks Can do better with further weight loss   Recommendations:  Continue 5 mg Mounjaro Regular walking for exercise Make sure she takes Humalog if she is eating cereal or other high carbohydrate foods like bread or pasta No change in [place]  HYPERTENSION: Blood pressure is significantly higher compared to last visit and appears to be higher at home also She can restart HCTZ 12.5 mg  We will recheck renal function and lipids today  Autonomous thyroid function with multinodular goiter, recheck labs today since her pulse appears to be relatively fast    There are no Patient Instructions on file for this visit.    Elayne Snare 08/09/2022, 9:45 AM   Total visit time including counseling = 30 minutes

## 2022-08-10 ENCOUNTER — Encounter: Payer: Medicare Other | Admitting: Adult Health

## 2022-08-11 ENCOUNTER — Encounter: Payer: Self-pay | Admitting: Endocrinology

## 2022-08-20 DIAGNOSIS — N183 Chronic kidney disease, stage 3 unspecified: Secondary | ICD-10-CM | POA: Diagnosis not present

## 2022-08-20 DIAGNOSIS — E1122 Type 2 diabetes mellitus with diabetic chronic kidney disease: Secondary | ICD-10-CM | POA: Diagnosis not present

## 2022-08-20 DIAGNOSIS — I129 Hypertensive chronic kidney disease with stage 1 through stage 4 chronic kidney disease, or unspecified chronic kidney disease: Secondary | ICD-10-CM | POA: Diagnosis not present

## 2022-08-23 ENCOUNTER — Ambulatory Visit: Payer: Medicare Other

## 2022-08-29 ENCOUNTER — Other Ambulatory Visit: Payer: Self-pay | Admitting: Endocrinology

## 2022-08-30 ENCOUNTER — Ambulatory Visit (INDEPENDENT_AMBULATORY_CARE_PROVIDER_SITE_OTHER): Payer: Medicare Other

## 2022-08-30 DIAGNOSIS — Z23 Encounter for immunization: Secondary | ICD-10-CM | POA: Diagnosis not present

## 2022-09-05 ENCOUNTER — Encounter (INDEPENDENT_AMBULATORY_CARE_PROVIDER_SITE_OTHER): Payer: Medicare Other | Admitting: Ophthalmology

## 2022-09-05 DIAGNOSIS — H43812 Vitreous degeneration, left eye: Secondary | ICD-10-CM | POA: Diagnosis not present

## 2022-09-05 DIAGNOSIS — E103593 Type 1 diabetes mellitus with proliferative diabetic retinopathy without macular edema, bilateral: Secondary | ICD-10-CM | POA: Diagnosis not present

## 2022-09-05 DIAGNOSIS — H35373 Puckering of macula, bilateral: Secondary | ICD-10-CM

## 2022-09-05 DIAGNOSIS — H35033 Hypertensive retinopathy, bilateral: Secondary | ICD-10-CM

## 2022-09-05 DIAGNOSIS — I10 Essential (primary) hypertension: Secondary | ICD-10-CM

## 2022-09-12 ENCOUNTER — Telehealth: Payer: Self-pay | Admitting: "Endocrinology

## 2022-09-12 NOTE — Telephone Encounter (Signed)
Received referral on patient from her PCP that she wants to establish here instead of continuing care at [facility]. Called patient and said she this was the wrong office and asked me to close referral.

## 2022-09-14 ENCOUNTER — Encounter: Payer: Self-pay | Admitting: Adult Health

## 2022-09-14 ENCOUNTER — Ambulatory Visit (INDEPENDENT_AMBULATORY_CARE_PROVIDER_SITE_OTHER): Payer: Medicare Other | Admitting: Adult Health

## 2022-09-14 VITALS — BP 149/78 | HR 95 | Ht 67.0 in | Wt 217.0 lb

## 2022-09-14 DIAGNOSIS — L0232 Furuncle of buttock: Secondary | ICD-10-CM | POA: Diagnosis not present

## 2022-09-14 MED ORDER — SULFAMETHOXAZOLE-TRIMETHOPRIM 800-160 MG PO TABS: 1.0000 | ORAL_TABLET | Freq: Two times a day (BID) | ORAL | 0 refills | Status: DC

## 2022-09-14 NOTE — Progress Notes (Signed)
  Subjective:     Patient ID: Cory Munch, female   DOB: 08-Dec-1945, 76 y.o.   MRN: [id number]  HPI Vanesha is a 76 year old black female, separated, PM complaining of boil that comes and goes on left buttock.  PCP is Dr Berdine Addison  Review of Systems +boil that comes and goes left buttock  Reviewed past medical,surgical, social and family history. Reviewed medications and allergies.     Objective:   Physical Exam BP (!) 149/78 (BP Location: Left Arm, Patient Position: Sitting, Cuff Size: Normal)   Pulse 95   Ht '5\' 7"'$  (1.702 m)   Wt 217 lb (98.4 kg)   BMI 33.99 kg/m     Skin warm and dry. Has 1.5 cm round nodule under the skin, tender, not red or indurated. Fall risk is moderate  [place] - 09/14/22 1205       Pregnancy Intention Screening   Does the patient want to become pregnant in the next year? N/A    Does the patient's partner want to become pregnant in the next year? N/A    Would the patient like to discuss contraceptive options today? N/A      Contraception Wrap Up   Current Method Female Sterilization    End Method Female Sterilization            Examination chaperoned by Levy Pupa LPN Assessment:     1. Boil of buttock 1.5 cm boil left buttock Will rx septra ds  Meds ordered this encounter  Medications   sulfamethoxazole-trimethoprim (BACTRIM DS) 800-160 MG tablet    Sig: Take 1 tablet by mouth 2 (two) times daily. Take 1 bid    Dispense:  28 tablet    Refill:  0    Order Specific Question:   Supervising Provider    Answer:   Tania Ade H [[id number]]      Use warm compresses but do not squeeze Plan:     Follow up prn

## 2022-09-15 ENCOUNTER — Telehealth: Payer: Self-pay

## 2022-09-15 DIAGNOSIS — M1712 Unilateral primary osteoarthritis, left knee: Secondary | ICD-10-CM

## 2022-09-15 NOTE — Telephone Encounter (Signed)
Talked with patient concerning gel injections.  Patient stated that she would like to discuss with her daughters before she makes an appointment.  Holding off, will CB when ready to schedule.

## 2022-09-27 ENCOUNTER — Ambulatory Visit (INDEPENDENT_AMBULATORY_CARE_PROVIDER_SITE_OTHER): Payer: Medicare Other | Admitting: Podiatry

## 2022-09-27 ENCOUNTER — Encounter: Payer: Self-pay | Admitting: Podiatry

## 2022-09-27 DIAGNOSIS — M79675 Pain in left toe(s): Secondary | ICD-10-CM

## 2022-09-27 DIAGNOSIS — B351 Tinea unguium: Secondary | ICD-10-CM | POA: Diagnosis not present

## 2022-09-27 DIAGNOSIS — L84 Corns and callosities: Secondary | ICD-10-CM | POA: Diagnosis not present

## 2022-09-27 DIAGNOSIS — I739 Peripheral vascular disease, unspecified: Secondary | ICD-10-CM

## 2022-09-27 DIAGNOSIS — M79674 Pain in right toe(s): Secondary | ICD-10-CM | POA: Diagnosis not present

## 2022-09-27 DIAGNOSIS — E1142 Type 2 diabetes mellitus with diabetic polyneuropathy: Secondary | ICD-10-CM

## 2022-10-02 NOTE — Progress Notes (Signed)
  Subjective:  Patient ID: Brittney [name], female    DOB: 1946/01/02,  MRN: [id number]  Brittney [name] presents to clinic today for at risk footcare. Patient has h/o diabetes, neuropathy and PAD and is seen for  and callus(es) right lower extremity and painful thick toenails that are difficult to trim. Painful toenails interfere with ambulation. Aggravating factors include wearing enclosed shoe gear. Pain is relieved with periodic professional debridement. Painful calluses are aggravated when weightbearing with and without shoegear. Pain is relieved with periodic professional debridement.  Chief Complaint  Patient presents with   Nail Problem    RFC Bilateral nail trim 1-5 A1c 6.7 Glucose 112    New problem(s): None.   PCP is Iona Beard, MD , and last visit was June 14, 2022.  Allergies  Allergen Reactions   Latex     Itching and breaking out   Pravastatin     Pt stated, "Made my muscle ache"   Review of Systems: Negative except as noted in the HPI.  Objective: No changes noted in today's physical examination.  Brittney [name] is a pleasant 76 y.o. female WD, WN in NAD. AAO x 3.  Vascular Examination: CFT <4 seconds b/l. DP pulse faintly palpable right; nonpalpable left. PT pulses nonpalpable b/l. Skin temperature gradient warm to warm b/l. No ischemia or gangrene. No cyanosis or clubbing noted b/l. Pedal hair sparse. No edema noted b/l LE.   Neurological Examination: Sensation grossly intact b/l with 10 gram monofilament. Vibratory sensation intact b/l. Pt has subjective symptoms of neuropathy.  Dermatological Examination: Pedal skin warm and supple b/l.   Toenails 1, 2, 4, 5 left foot; 2-5 right foot are b/l thick, discolored, elongated with subungual debris and pain on dorsal palpation.    Incurvated nailplate L 5th toe medial border with tenderness to palpation. No erythema, no edema, no drainage noted. No fluctuance.  Hyperkeratotic lesion(s) plantarlateral aspect of  midfoot left foot. No erythema, no edema, no drainage, no fluctuance.  Musculoskeletal Examination: Muscle strength 5/5 to all LE muscle groups of right lower extremity. Muscle strength 4/5to all LE muscle groups of left lower extremity. Charcot deformity noted left lower extremity.  Radiographs: None Assessment/Plan: 1. Pain due to onychomycosis of toenails of both feet   2. Callus   3. PAD (peripheral artery disease) ([place])   4. Diabetic polyneuropathy associated with type 2 diabetes mellitus ([place])     No orders of the defined types were placed in this encounter.   -Patient's family member present. All questions/concerns addressed on today's visit. -She is being followed by [facility] for her PAD. -Continue diabetic foot care principles: inspect feet daily, monitor glucose as recommended by PCP and/or Endocrinologist, and follow prescribed diet per PCP, Endocrinologist and/or dietician. -Continue supportive shoe gear daily. -Mycotic toenails 2-5 right foot, left great toe, L 2nd toe, L 4th toe, and left fourth digit were debrided in length and girth with sterile nail nippers and dremel without iatrogenic bleeding. -Callus(es) plantarlateral aspect of midfoot left foot pared utilizing sterile scalpel blade without complication or incident. Total number debrided =1. -Patient/POA to call should there be question/concern in the interim.   Return in about 3 months (around 12/28/2022).  Marzetta Board, DPM

## 2022-10-10 DIAGNOSIS — E1142 Type 2 diabetes mellitus with diabetic polyneuropathy: Secondary | ICD-10-CM | POA: Diagnosis not present

## 2022-10-10 DIAGNOSIS — I10 Essential (primary) hypertension: Secondary | ICD-10-CM | POA: Diagnosis not present

## 2022-10-10 DIAGNOSIS — N183 Chronic kidney disease, stage 3 unspecified: Secondary | ICD-10-CM | POA: Diagnosis not present

## 2022-10-10 DIAGNOSIS — E785 Hyperlipidemia, unspecified: Secondary | ICD-10-CM | POA: Diagnosis not present

## 2022-10-10 DIAGNOSIS — E1122 Type 2 diabetes mellitus with diabetic chronic kidney disease: Secondary | ICD-10-CM | POA: Diagnosis not present

## 2022-10-18 ENCOUNTER — Other Ambulatory Visit: Payer: Self-pay | Admitting: Endocrinology

## 2022-10-20 DIAGNOSIS — E119 Type 2 diabetes mellitus without complications: Secondary | ICD-10-CM | POA: Diagnosis not present

## 2022-10-20 DIAGNOSIS — Z794 Long term (current) use of insulin: Secondary | ICD-10-CM | POA: Diagnosis not present

## 2022-10-27 ENCOUNTER — Other Ambulatory Visit: Payer: Self-pay | Admitting: Endocrinology

## 2022-11-01 ENCOUNTER — Other Ambulatory Visit: Payer: Self-pay | Admitting: Endocrinology

## 2022-11-02 ENCOUNTER — Ambulatory Visit (INDEPENDENT_AMBULATORY_CARE_PROVIDER_SITE_OTHER): Payer: Medicare Other | Admitting: Endocrinology

## 2022-11-02 ENCOUNTER — Encounter: Payer: Self-pay | Admitting: Endocrinology

## 2022-11-02 VITALS — BP 150/70 | HR 86 | Ht 67.0 in | Wt 215.2 lb

## 2022-11-02 DIAGNOSIS — E0590 Thyrotoxicosis, unspecified without thyrotoxic crisis or storm: Secondary | ICD-10-CM | POA: Diagnosis not present

## 2022-11-02 DIAGNOSIS — E1165 Type 2 diabetes mellitus with hyperglycemia: Secondary | ICD-10-CM

## 2022-11-02 DIAGNOSIS — Z794 Long term (current) use of insulin: Secondary | ICD-10-CM

## 2022-11-02 DIAGNOSIS — E042 Nontoxic multinodular goiter: Secondary | ICD-10-CM | POA: Diagnosis not present

## 2022-11-02 LAB — MICROALBUMIN / CREATININE URINE RATIO
Creatinine,U: 73.7 mg/dL
Microalb Creat Ratio: 1.2 mg/g (ref 0.0–30.0)
Microalb, Ur: 0.9 mg/dL (ref 0.0–1.9)

## 2022-11-02 LAB — POCT GLYCOSYLATED HEMOGLOBIN (HGB A1C): Hemoglobin A1C: 6.9 % — AB (ref 4.0–5.6)

## 2022-11-02 LAB — BASIC METABOLIC PANEL
BUN: 23 mg/dL (ref 6–23)
CO2: 27 mEq/L (ref 19–32)
Calcium: 9.9 mg/dL (ref 8.4–10.5)
Chloride: 105 mEq/L (ref 96–112)
Creatinine, Ser: 0.96 mg/dL (ref 0.40–1.20)
GFR: 57.89 mL/min — ABNORMAL LOW (ref >60.00)
Glucose, Bld: 172 mg/dL — ABNORMAL HIGH (ref 70–99)
Potassium: 4.7 mEq/L (ref 3.5–5.1)
Sodium: 138 mEq/L (ref 135–145)

## 2022-11-02 MED ORDER — TIRZEPATIDE 2.5 MG/0.5ML ~~LOC~~ SOAJ: 2.5000 mg | SUBCUTANEOUS | 0 refills | Status: DC

## 2022-11-02 NOTE — Progress Notes (Unsigned)
Patient ID: Brittney [name], female   DOB: 10-19-1946, 76 y.o.   MRN: [id number]     Reason for Appointment: Follow-up visit  History of Present Illness    Type 2 DIABETES MELITUS, date of diagnosis: 1976      She has had long-standing diabetes taking insulin. Because of her difficulties with compliance using mealtime insulin she was given in [place] in 2011 and subsequently Victoza in addition to her Lantus and this helped her control However she tends to be erratic with her day-to-day care and has not had adequate A1c levels in the past, usually over 7%  RECENT history:   Insulin regimen: Tresiba 30 units daily in a.m. Humalog 4-7 units at times  Non-insulin hypoglycemic drugs: Mounjaro 5 mg weekly,   metformin  850 twice a day  Her A1c 6.9 Previously 6.7  Current glucose patterns and problems: She was continuing to get good results from [facility] compared to [place] However 3 weeks ago she stopped it on her own without calling us because she thought it was causing her thyroid to swell up  Her blood sugars are significantly higher both Premeal and after meals  As before she is not understanding the need to take Humalog before eating and only takes it when the blood sugar goes up  Was not having any side effects with Mounjaro A1c is only slightly higher but her time in range is down to only 46% now She is only doing some walking irregularly At times will have more carbohydrates with breakfast which is late morning and this causes her sugars to be relatively higher but they continue to be high the rest of the day Weight is about the same  Side effects from medications:  Invokana: ? Headache.  Victoza ?  Swelling in neck      Results of CGM download from [facility] sensor with the interpretation for the last 2 weeks data as follows  Summary of blood sugar patterns:  Her blood sugars are Significantly higher compared to her last visit with highest readings midday Overnight  blood sugars are averaging about 190 around midnight and only decreasing to the target levels after about 7 AM Lowest readings are between 10-10:30 AM  Premeal readings are again high at lunch and dinner and over 180  POSTPRANDIAL readings appear to be higher after her first meal around 10 AM and well above target till about 2 PM Average after the first meal is the highest 215 Average the rest of the afternoon and evening is between 187 and 202 No hypoglycemia   CGM use % of time 93  2-week average/GV 189/20  Time in range      46%  % Time Above 180 47  % Time above 250 7  % Time Below 70 0    Previously:/  CGM use % of time 86  2-week average/GV 151  Time in range       86 %  % Time Above 180 17  % Time above 250   % Time Below 70 0     PRE-MEAL Fasting Lunch Dinner Bedtime Overall  Glucose range:       Averages: 135       POST-MEAL PC Breakfast PC Lunch PC Dinner  Glucose range:     Averages:  175         Meals: 2-3 meals per day. . Dinner 6-7 pm, variable quantity and carbohydrates  Does have protein with breakfast consistently, has egg  for protein   Wt Readings from Last 3 Encounters:  11/02/22 215 lb 3.2 oz (97.6 kg)  09/14/22 217 lb (98.4 kg)  08/09/22 214 lb 8 oz (97.3 kg)   GLYCEMIC CONTROL:  Lab Results  Component Value Date   HGBA1C 6.9 (A) 11/02/2022   HGBA1C 6.7 (A) 08/09/2022   HGBA1C 7.8 (A) 12/21/2021   Lab Results  Component Value Date   MICROALBUR 0.9 11/02/2022   LDLCALC 65 08/09/2022   CREATININE 0.96 11/02/2022   Lab Results  Component Value Date   FRUCTOSAMINE 345 (H) 03/30/2020   FRUCTOSAMINE 342 (H) 09/08/2017      Other problems discussed today: See review of systems   Allergies as of 11/02/2022       Reactions   Latex    Itching and breaking out   Pravastatin    Pt stated, "Made my muscle ache"        Medication List        Accurate as of November 02, 2022 11:59 PM. If you have any questions, ask your nurse  or doctor.          STOP taking these medications    sulfamethoxazole-trimethoprim 800-160 MG tablet Commonly known as: BACTRIM DS Stopped by: Elayne Snare, MD       TAKE these medications    Accu-Chek Guide Me w/Device Kit Use to check blood sugar 2 times per day dx code E11.65   Accu-Chek Guide test strip Generic drug: glucose blood USE TO TEST BLOOD SUGAR THREE TIMES DAILY AS DIRECTED.   B-D ULTRAFINE III SHORT PEN 31G X 8 MM Misc Generic drug: Insulin Pen Needle USE  2-3 TIMES A DAY.   cloNIDine 0.1 MG tablet Commonly known as: CATAPRES TAKE (1) TABLET BY MOUTH TWICE DAILY.   HumaLOG KwikPen 100 UNIT/ML KwikPen Generic drug: insulin lispro INJECT 6 TO 7 UNITS SUBCUTANEOUSLY BEFORE SUPPER IF GLUCOSE IS HIGH.   hydrochlorothiazide 12.5 MG capsule Commonly known as: MICROZIDE Take 1 capsule (12.5 mg total) by mouth daily.   metoprolol tartrate 50 MG tablet Commonly known as: LOPRESSOR TAKE 1/2 TABLET BY MOUTH TWICE DAILY.   multivitamin tablet Take 1 tablet by mouth once a week.   omeprazole 20 MG capsule Commonly known as: PRILOSEC Take 20 mg by mouth daily.   onetouch ultrasoft lancets   simvastatin 10 MG tablet Commonly known as: ZOCOR Take 1 tablet (10 mg total) by mouth at bedtime.   Sure Comfort Insulin Syringe 31G X 5/16" 0.3 ML Misc Generic drug: Insulin Syringe-Needle U-100 USE AS DIRECTED 2-3 TIMES A DAY.   tirzepatide 5 MG/0.5ML Pen Commonly known as: MOUNJARO Inject 5 mg into the skin once a week. What changed: Another medication with the same name was added. Make sure you understand how and when to take each. Changed by: Elayne Snare, MD   tirzepatide 2.5 MG/0.5ML Pen Commonly known as: MOUNJARO Inject 2.5 mg into the skin once a week. What changed: You were already taking a medication with the same name, and this prescription was added. Make sure you understand how and when to take each. Changed by: Elayne Snare, MD         Allergies:  Allergies  Allergen Reactions   Latex     Itching and breaking out   Pravastatin     Pt stated, "Made my muscle ache"    Past Medical History:  Diagnosis Date   Atrial fibrillation (HCC)    Paroxysmal; LVH; nl EF; onset in 1999  Burning with urination 02/23/2016   Diabetes mellitus    insulin; managed by Dr. 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   Hyperlipidemia    Hypertension    Hypothyroidism    history of goiter; nl TSH off medication   Itching with irritation 02/27/2015   Obesity 07/06/2012   Palpitations    negative event recorder in 2009   UTI (urinary tract infection) 02/23/2016    Past Surgical History:  Procedure Laterality Date   BIOPSY THYROID     CESAREAN SECTION     CHOLECYSTECTOMY  11/95   COLONOSCOPY  2007   RETINAL DETACHMENT SURGERY  2009   TUBAL LIGATION  1980's    Family History  Problem Relation Age of Onset   Diabetes Mother    Diabetes Daughter        gestational diabetes   Cancer Father    Thyroid disease Brother    Other Son        MVA    Social History:  reports that she quit smoking about 36 years ago. Her smoking use included cigarettes. She started smoking about 55 years ago. She smoked an average of 1 pack per day. She has never used smokeless tobacco. She reports that she does not drink alcohol and does not use drugs.  Review of Systems:   HYPERTENSION:  Previously followed by PCP, had been on Norvasc, MAXZIDE and Benicar  Because of high potassium and increased creatinine her Benicar and Maxzide had been stopped She is on the clonidine 0.1 mg bid along, also on 50 mg Toprol She was told to resume HCTZ but for some reason she is not taking this now and blood pressure is higher  Previously on the clonidine patch she had itching at the site  Also checking at home periodically, readings appear to be generally higher  BP Readings from Last 3 Encounters:  11/02/22 (!) 150/70   09/14/22 (!) 149/78  08/09/22 (!) 146/80   Renal function:   Lab Results  Component Value Date   CREATININE 0.96 11/02/2022   CREATININE 0.91 08/09/2022   CREATININE 1.49 (H) 05/09/2022     HYPERLIPIDEMIA: The lipid abnormality consists of elevated LDL; she stopped her pravastatin because of symptoms of muscle aches and cramping Now taking simvastatin 2/7 days a week without side effects   Lab Results  Component Value Date   CHOL 152 08/09/2022   HDL 58.20 08/09/2022   LDLCALC 65 08/09/2022   TRIG 142.0 08/09/2022   CHOLHDL 3 08/09/2022    MULTINODULAR GOITER: She has had a long-standing multinodular goiter since at least 2003  She does have history of occasional palpitations for years Metoprolol has been prescribed by cardiologist  She has no increased local pressure symptoms in the neck or swallowing difficulty However she thinks it swells up sometimes and she is concerned about it She is reluctant to consider surgery  Her goiter has been autonomous with no evidence of overt hyperthyroidism  with normal free T4 and free T3 levels; TSH has been consistently suppressed  Her I-131 uptake in 2003 was 17% with patchy uptake  Previously thyroid ultrasound in 2014 did not show distinct nodules but a relative increase in size  Lab Results  Component Value Date   TSH <0.01 (L) 08/09/2022   TSH <0.01 (L) 05/09/2022   FREET4 0.95 08/09/2022   FREET4 1.32 05/09/2022   Lab Results  Component Value Date   T3FREE 2.8 08/09/2022  T3FREE 3.5 05/09/2022   T3FREE 3.1 12/24/2021       Foot exam done 12/21/2021 She has some numbness in her toes which is mild and chronic  She uses Aspercreme given by podiatrist  She has been using diabetic shoes  She is periodically seen by podiatrist   Examination:   BP (!) 150/70   Pulse 86   Ht _0  (1.702 m)   Wt 215 lb 3.2 oz (97.6 kg)   SpO2 98%   BMI 33.71 kg/m   Body mass index is 33.71 kg/m.   She has a very large  goiter especially on the right side  ASSESSMENT/ PLAN:  Diabetes Type 2 with obesity on insulin, Mounjaro and metformin  See history of present illness for detailed discussion of  current management, blood sugar patterns and problems identified  Her A1c is slightly higher at 6.9  She was doing well with [facility] but now her blood sugars are higher with stopping this 3 weeks ago Blood sugars appear to be high throughout the day and somewhat higher after her first meal which is now covered with mealtime insulin  Recommendations:  RESTART Mounjaro but she can start with 2.5 before going back to 5 mg Mounjaro Regular walking for exercise Make sure she takes Humalog before her first meal if she is eating oatmeal, cereal or other high carbohydrate foods in the morning  No change in [place] unless blood sugars take continuously higher fasting when she has been on 5 mg on Mounjaro  HYPERTENSION: Blood pressure is again out of control and was better with HCTZ which she is not taking  Has been higher at home also She needs to restart HCTZ 12.5 mg  Check microalbumin today  Autonomous thyroid function with multinodular goiter: She is concerned about the size of the thyroid and will consider I-131 treatment if her uptake is increased, uptake and scan ordered    There are no Patient Instructions on file for this visit.    Elayne Snare 11/03/2022, 11:12 AM   Total visit time including counseling = 30 minutes

## 2022-11-03 ENCOUNTER — Encounter: Payer: Self-pay | Admitting: Endocrinology

## 2022-11-08 ENCOUNTER — Encounter: Payer: Self-pay | Admitting: Endocrinology

## 2022-11-09 ENCOUNTER — Other Ambulatory Visit (HOSPITAL_COMMUNITY): Payer: Medicare Other

## 2022-11-10 ENCOUNTER — Other Ambulatory Visit (HOSPITAL_COMMUNITY): Payer: Medicare Other

## 2022-11-10 ENCOUNTER — Encounter (HOSPITAL_COMMUNITY): Payer: Medicare Other

## 2022-11-11 ENCOUNTER — Other Ambulatory Visit (HOSPITAL_COMMUNITY): Payer: Medicare Other

## 2022-11-16 ENCOUNTER — Other Ambulatory Visit: Payer: Self-pay

## 2022-11-16 DIAGNOSIS — I10 Essential (primary) hypertension: Secondary | ICD-10-CM

## 2022-11-16 MED ORDER — HYDROCHLOROTHIAZIDE 12.5 MG PO CAPS: 12.5000 mg | ORAL_CAPSULE | Freq: Every day | ORAL | 1 refills | Status: DC

## 2022-11-17 ENCOUNTER — Encounter (HOSPITAL_COMMUNITY)
Admission: RE | Admit: 2022-11-17 | Discharge: 2022-11-17 | Disposition: A | Discharge status: Home / Self Care | Payer: Medicare Other | Source: Ambulatory Visit | Attending: Endocrinology | Admitting: Endocrinology

## 2022-11-17 ENCOUNTER — Encounter (HOSPITAL_COMMUNITY): Payer: Self-pay

## 2022-11-17 DIAGNOSIS — E042 Nontoxic multinodular goiter: Secondary | ICD-10-CM | POA: Diagnosis not present

## 2022-11-17 DIAGNOSIS — E0590 Thyrotoxicosis, unspecified without thyrotoxic crisis or storm: Secondary | ICD-10-CM | POA: Insufficient documentation

## 2022-11-17 MED ORDER — SODIUM IODIDE I-123 7.4 MBQ CAPS
500.0000 | ORAL_CAPSULE | Freq: Once | ORAL | Status: AC
  Administered 2022-11-17: 449 via ORAL

## 2022-11-18 ENCOUNTER — Encounter (HOSPITAL_COMMUNITY)
Admission: RE | Admit: 2022-11-18 | Discharge: 2022-11-18 | Disposition: A | Discharge status: Home / Self Care | Payer: Medicare Other | Source: Ambulatory Visit | Attending: Endocrinology | Admitting: Endocrinology

## 2022-11-18 DIAGNOSIS — E042 Nontoxic multinodular goiter: Secondary | ICD-10-CM | POA: Diagnosis not present

## 2022-11-23 NOTE — Progress Notes (Signed)
Let her know that the iodine uptake is not high and radioactive iodine will not work

## 2022-11-28 ENCOUNTER — Telehealth: Payer: Self-pay | Admitting: Endocrinology

## 2022-11-28 ENCOUNTER — Other Ambulatory Visit: Payer: Self-pay

## 2022-11-28 DIAGNOSIS — E1165 Type 2 diabetes mellitus with hyperglycemia: Secondary | ICD-10-CM

## 2022-11-28 MED ORDER — DEXCOM G7 SENSOR MISC: 4 refills | Status: DC

## 2022-11-28 NOTE — Telephone Encounter (Signed)
Patient is calling for the results of the tests that she had done at [facility] the week before New Years Day.

## 2022-11-30 NOTE — Telephone Encounter (Signed)
She asked if you can call her cell [phone]. I just talked talked to her.

## 2022-12-01 NOTE — Telephone Encounter (Signed)
Patient called in today again requesting you to call her about results. She is really concerned. Please call number below.

## 2022-12-02 NOTE — Telephone Encounter (Signed)
Patient called again and wants to know if there is someone you can refer her too?

## 2022-12-09 ENCOUNTER — Other Ambulatory Visit: Payer: Self-pay | Admitting: Endocrinology

## 2022-12-09 DIAGNOSIS — E042 Nontoxic multinodular goiter: Secondary | ICD-10-CM

## 2022-12-09 NOTE — Telephone Encounter (Signed)
She is interested in surgery.

## 2022-12-13 ENCOUNTER — Ambulatory Visit: Payer: Medicare Other | Admitting: Endocrinology

## 2022-12-14 ENCOUNTER — Telehealth: Payer: Self-pay | Admitting: Endocrinology

## 2022-12-14 DIAGNOSIS — Z794 Long term (current) use of insulin: Secondary | ICD-10-CM

## 2022-12-14 NOTE — Telephone Encounter (Signed)
Patient is calling to say that pharmacy ([facility]) has not received orders for her Dexcom 7 supplies.  They have told her that they need orders.

## 2022-12-15 NOTE — Telephone Encounter (Signed)
Prescription has been re faxed.

## 2022-12-20 DIAGNOSIS — E1122 Type 2 diabetes mellitus with diabetic chronic kidney disease: Secondary | ICD-10-CM | POA: Diagnosis not present

## 2022-12-20 DIAGNOSIS — E785 Hyperlipidemia, unspecified: Secondary | ICD-10-CM | POA: Diagnosis not present

## 2022-12-20 DIAGNOSIS — I129 Hypertensive chronic kidney disease with stage 1 through stage 4 chronic kidney disease, or unspecified chronic kidney disease: Secondary | ICD-10-CM | POA: Diagnosis not present

## 2022-12-20 DIAGNOSIS — N183 Chronic kidney disease, stage 3 unspecified: Secondary | ICD-10-CM | POA: Diagnosis not present

## 2022-12-28 NOTE — Telephone Encounter (Signed)
F/u   Patient calling pharmacy has not received information Dexcom 7 supplies.    [facility] [place]

## 2022-12-29 MED ORDER — DEXCOM G7 SENSOR MISC: 4 refills | Status: DC

## 2022-12-29 NOTE — Addendum Note (Signed)
Addended by: Sarina Ill on: 12/29/2022 12:22 PM   Modules accepted: Orders

## 2022-12-29 NOTE — Telephone Encounter (Signed)
RX sent  

## 2023-01-04 DIAGNOSIS — E042 Nontoxic multinodular goiter: Secondary | ICD-10-CM | POA: Diagnosis not present

## 2023-01-04 DIAGNOSIS — E0500 Thyrotoxicosis with diffuse goiter without thyrotoxic crisis or storm: Secondary | ICD-10-CM | POA: Insufficient documentation

## 2023-01-06 ENCOUNTER — Other Ambulatory Visit (HOSPITAL_COMMUNITY): Payer: Self-pay | Admitting: Surgery

## 2023-01-06 DIAGNOSIS — E042 Nontoxic multinodular goiter: Secondary | ICD-10-CM

## 2023-01-06 DIAGNOSIS — E0500 Thyrotoxicosis with diffuse goiter without thyrotoxic crisis or storm: Secondary | ICD-10-CM

## 2023-01-09 ENCOUNTER — Telehealth: Payer: Self-pay | Admitting: Cardiology

## 2023-01-09 ENCOUNTER — Telehealth: Payer: Self-pay | Admitting: *Deleted

## 2023-01-09 DIAGNOSIS — I10 Essential (primary) hypertension: Secondary | ICD-10-CM | POA: Diagnosis not present

## 2023-01-09 DIAGNOSIS — E1122 Type 2 diabetes mellitus with diabetic chronic kidney disease: Secondary | ICD-10-CM | POA: Diagnosis not present

## 2023-01-09 DIAGNOSIS — E785 Hyperlipidemia, unspecified: Secondary | ICD-10-CM | POA: Diagnosis not present

## 2023-01-09 DIAGNOSIS — N182 Chronic kidney disease, stage 2 (mild): Secondary | ICD-10-CM | POA: Diagnosis not present

## 2023-01-09 NOTE — Telephone Encounter (Signed)
Primary Cardiologist:Branch, Roderic Palau, MD   Preoperative team, please contact this patient and set up a phone call appointment for further preoperative risk assessment. Please obtain consent and complete medication review. Thank you for your help.   I confirm that guidance regarding antiplatelet and oral anticoagulation therapy has been completed and, if necessary, noted below (none requested).   Emmaline Life, NP-C  01/09/2023, 4:06 PM [address] Office [phone] Fax [phone]

## 2023-01-09 NOTE — Telephone Encounter (Signed)
  Patient Consent for Virtual Visit         Brittney [name] has provided verbal consent on 01/09/2023 for a virtual visit (video or telephone).   CONSENT FOR VIRTUAL VISIT FOR:  Brittney [name]  By participating in this virtual visit I agree to the following:  I hereby voluntarily request, consent and authorize [facility] and its employed or contracted physicians, physician assistants, nurse practitioners or other licensed health care professionals (the Practitioner), to provide me with telemedicine health care services (the "Services") as deemed necessary by the treating Practitioner. I acknowledge and consent to receive the Services by the Practitioner via telemedicine. I understand that the telemedicine visit will involve communicating with the Practitioner through live audiovisual communication technology and the disclosure of certain medical information by electronic transmission. I acknowledge that I have been given the opportunity to request an in-person assessment or other available alternative prior to the telemedicine visit and am voluntarily participating in the telemedicine visit.  I understand that I have the right to withhold or withdraw my consent to the use of telemedicine in the course of my care at any time, without affecting my right to future care or treatment, and that the Practitioner or I may terminate the telemedicine visit at any time. I understand that I have the right to inspect all information obtained and/or recorded in the course of the telemedicine visit and may receive copies of available information for a reasonable fee.  I understand that some of the potential risks of receiving the Services via telemedicine include:  Delay or interruption in medical evaluation due to technological equipment failure or disruption; Information transmitted may not be sufficient (e.g. poor resolution of images) to allow for appropriate medical decision making by the  Practitioner; and/or  In rare instances, security protocols could fail, causing a breach of personal health information.  Furthermore, I acknowledge that it is my responsibility to provide information about my medical history, conditions and care that is complete and accurate to the best of my ability. I acknowledge that Practitioner's advice, recommendations, and/or decision may be based on factors not within their control, such as incomplete or inaccurate data provided by me or distortions of diagnostic images or specimens that may result from electronic transmissions. I understand that the practice of medicine is not an exact science and that Practitioner makes no warranties or guarantees regarding treatment outcomes. I acknowledge that a copy of this consent can be made available to me via my patient portal ([facility]), or I can request a printed copy by calling the office of [facility].    I understand that my insurance will be billed for this visit.   I have read or had this consent read to me. I understand the contents of this consent, which adequately explains the benefits and risks of the Services being provided via telemedicine.  I have been provided ample opportunity to ask questions regarding this consent and the Services and have had my questions answered to my satisfaction. I give my informed consent for the services to be provided through the use of telemedicine in my medical care

## 2023-01-09 NOTE — Telephone Encounter (Signed)
   Pre-operative Risk Assessment    Patient Name: Brittney [name]  DOB: Jan 27, 1946 MRN: [id number]      Request for Surgical Clearance    Procedure:   thyroid surgery  Date of Surgery:  Clearance TBD                                 Surgeon:  Armandina Gemma, MD Surgeon's Group or Practice Name:  [facility] Phone number:  [phone] leave a message with triage nurse Fax number:  [phone] att: Mammie Lorenzo, LPN   Type of Clearance Requested:   - Medical  - Pharmacy:  Hold        Type of Anesthesia:  General    Additional requests/questions:    Signed, Hipolito Bayley   01/09/2023, 3:24 PM

## 2023-01-11 ENCOUNTER — Telehealth: Payer: Self-pay | Admitting: Endocrinology

## 2023-01-11 NOTE — Telephone Encounter (Signed)
Patient is calling to say that she had been seen by the surgeon, Armandina Gemma, M.D. for her thyroid.  Patient states that she was told by Dr. Harlow Asa that a medication would be prescribed for her prior to the procedure by Dr. Dwyane Dee.  Patient is calling to say that she has not received the medication from the pharmacy yet and is calling to check on it.  Patient states that Dr. Williemae [facility] office will be calling on 01/18/2023 to let her know when the procedure will be scheduled.

## 2023-01-13 NOTE — Telephone Encounter (Signed)
Patient informed and expressed understanding

## 2023-01-16 ENCOUNTER — Ambulatory Visit (INDEPENDENT_AMBULATORY_CARE_PROVIDER_SITE_OTHER): Payer: 59 | Admitting: Podiatry

## 2023-01-16 ENCOUNTER — Encounter: Payer: Self-pay | Admitting: Podiatry

## 2023-01-16 VITALS — BP 160/92

## 2023-01-16 DIAGNOSIS — Z9181 History of falling: Secondary | ICD-10-CM | POA: Diagnosis not present

## 2023-01-16 DIAGNOSIS — M79675 Pain in left toe(s): Secondary | ICD-10-CM | POA: Diagnosis not present

## 2023-01-16 DIAGNOSIS — I739 Peripheral vascular disease, unspecified: Secondary | ICD-10-CM | POA: Diagnosis not present

## 2023-01-16 DIAGNOSIS — R2689 Other abnormalities of gait and mobility: Secondary | ICD-10-CM | POA: Diagnosis not present

## 2023-01-16 DIAGNOSIS — E119 Type 2 diabetes mellitus without complications: Secondary | ICD-10-CM | POA: Diagnosis not present

## 2023-01-16 DIAGNOSIS — B351 Tinea unguium: Secondary | ICD-10-CM

## 2023-01-16 DIAGNOSIS — L601 Onycholysis: Secondary | ICD-10-CM

## 2023-01-16 DIAGNOSIS — E11610 Type 2 diabetes mellitus with diabetic neuropathic arthropathy: Secondary | ICD-10-CM | POA: Diagnosis not present

## 2023-01-16 DIAGNOSIS — M79674 Pain in right toe(s): Secondary | ICD-10-CM | POA: Diagnosis not present

## 2023-01-16 DIAGNOSIS — E1142 Type 2 diabetes mellitus with diabetic polyneuropathy: Secondary | ICD-10-CM | POA: Diagnosis not present

## 2023-01-16 MED ORDER — GENTAMICIN SULFATE 0.1 % EX CREA: TOPICAL_CREAM | CUTANEOUS | 0 refills | Status: DC

## 2023-01-16 NOTE — Progress Notes (Unsigned)
ANNUAL DIABETIC FOOT EXAM  Subjective: Brittney [name] presents today {jgcomplaint:23593}.  Chief Complaint  Patient presents with   Nail Problem    DFC BS-did not check today A1C-6.? PCP-Hill, Gerald PCP VST-01/09/2023    Patient confirms h/o diabetes.  Patient relates {Numbers; 0-100:15068} year h/o diabetes.  Patient denies any h/o foot wounds.  Patient has h/o foot ulcer of {jgPodToeLocator:23637}, which healed via help of ***.  Patient has h/o amputation(s): {jgamp:23617}.  Patient endorses symptoms of foot numbness.   Patient endorses symptoms of foot tingling.  Patient endorses symptoms of burning in feet.  Patient endorses symptoms of pins/needles sensation in feet.  Patient denies any numbness, tingling, burning, or pins/needle sensation in feet.  Patient has been diagnosed with neuropathy and it is managed with {JGNEUROPATHYMEDS:27053}.  Risk factors: {jgriskfactors:24044}.  Iona Beard, MD is patient's PCP. Last visit was {Time; dates multiple:15870}***.  Past Medical History:  Diagnosis Date   Atrial fibrillation ([place])    Paroxysmal; LVH; nl EF; onset in 1999   Burning with urination 02/23/2016   Diabetes mellitus    insulin; managed by Dr. 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[facility])    left lower extremity   Hyperlipidemia    Hypertension    Hypothyroidism    history of goiter; nl TSH off medication   Itching with irritation 02/27/2015   Obesity 07/06/2012   Palpitations    negative event recorder in 2009   UTI (urinary tract infection) 02/23/2016   Patient Active Problem List   Diagnosis Date Noted   Goiter with hyperthyroidism 01/04/2023   Multiple thyroid nodules 01/04/2023   Unilateral primary osteoarthritis, left knee 04/20/2022   UTI (urinary tract infection) 02/23/2016   Burning with urination 02/23/2016   Itching with irritation 02/27/2015   Goiter diffuse, adenomatous 02/05/2014   Diabetic polyneuropathy ([place]) 09/25/2013    Obesity 07/06/2012   Diabetic Charcot's joint disease ([place])    Subclinical hyperthyroidism 06/30/2009   Type II diabetes mellitus, uncontrolled 06/30/2009   Hyperlipidemia 06/30/2009   Essential hypertension, benign 06/30/2009   PAROXYSMAL ATRIAL FIBRILLATION 06/30/2009   Past Surgical History:  Procedure Laterality Date   BIOPSY THYROID     CESAREAN SECTION     CHOLECYSTECTOMY  11/95   COLONOSCOPY  2007   RETINAL DETACHMENT SURGERY  2009   TUBAL LIGATION  1980's   Current Outpatient Medications on File Prior to Visit  Medication Sig Dispense Refill   aspirin 81 MG chewable tablet Chew 81 mg by mouth daily.     Blood Glucose Monitoring Suppl (ACCU-CHEK GUIDE ME) w/Device KIT Use to check blood sugar 2 times per day dx code E11.65 1 kit 0   cloNIDine (CATAPRES) 0.1 MG tablet TAKE (1) TABLET BY MOUTH TWICE DAILY. 180 tablet 1   Continuous Blood Gluc Sensor (DEXCOM G7 SENSOR) MISC Change every 10 days 9 each 4   glucose blood (ACCU-CHEK GUIDE) test strip USE TO TEST BLOOD SUGAR THREE TIMES DAILY AS DIRECTED. 100 strip 2   HUMALOG KWIKPEN 100 UNIT/ML KwikPen INJECT 6 TO 7 UNITS SUBCUTANEOUSLY BEFORE SUPPER IF GLUCOSE IS HIGH. 15 mL 0   hydrochlorothiazide (MICROZIDE) 12.5 MG capsule Take 1 capsule (12.5 mg total) by mouth daily. 90 capsule 1   Insulin Pen Needle (B-D ULTRAFINE III SHORT PEN) 31G X 8 MM MISC USE  2-3 TIMES A DAY. 100 each 4   Lancets (ONETOUCH ULTRASOFT) lancets      metoprolol tartrate (LOPRESSOR) 50 MG tablet TAKE 1/2 TABLET BY MOUTH  TWICE DAILY. 90 tablet 2   Multiple Vitamin (MULTIVITAMIN) tablet Take 1 tablet by mouth once a week.     omeprazole (PRILOSEC) 20 MG capsule Take 20 mg by mouth daily.     SURE COMFORT INSULIN SYRINGE 31G X 5/16" 0.3 ML MISC USE AS DIRECTED 2-3 TIMES A DAY. 100 each 0   TRESIBA FLEXTOUCH 100 UNIT/ML FlexTouch Pen Inject 30 Units into the skin daily.     No current facility-administered medications on file prior to visit.    Allergies   Allergen Reactions   Latex     Itching and breaking out   Pravastatin     Pt stated, "Made my muscle ache"   Social History   Occupational History   Occupation: Retired  Tobacco Use   Smoking status: Former    Packs/day: 1.00    Types: Cigarettes    Start date: 11/21/1966    Quit date: 08/13/1986    Years since quitting: 36.4   Smokeless tobacco: Never  Vaping Use   Vaping Use: Never used  Substance and Sexual Activity   Alcohol use: No    Alcohol/week: 0.0 standard drinks of alcohol   Drug use: No   Sexual activity: Not Currently    Birth control/protection: Post-menopausal, Surgical    Comment: tubal   Family History  Problem Relation Age of Onset   Diabetes Mother    Diabetes Daughter        gestational diabetes   Cancer Father    Thyroid disease Brother    Other Son        MVA   Immunization History  Administered Date(s) Administered   Fluad Quad(high Dose 65+) 08/14/2019, 08/17/2021, 08/30/2022   Influenza, High Dose Seasonal PF 09/08/2017, 10/07/2020   Influenza,inj,Quad PF,6+ Mos 09/25/2013, 09/09/2014, 09/22/2015, 08/31/2016, 08/27/2018   Moderna Sars-Covid-2 Vaccination 07/02/2020, 07/30/2020   Pneumococcal Conjugate-13 05/06/2015     Review of Systems: Negative except as noted in the HPI.   Objective: Vitals:   01/16/23 1040  BP: (!) 160/92   Brittney [name] is a pleasant 77 y.o. female in NAD. AAO X 3.  Vascular Examination: {jgvascular:23595}  Dermatological Examination: {jgderm:23598}  Neurological Examination: {jgneuro:23601::"Protective sensation intact 5/5 intact bilaterally with 10g monofilament b/l.","Vibratory sensation intact b/l.","Proprioception intact bilaterally."}  Musculoskeletal Examination: {jgmsk:23600}  Footwear Assessment: Does the patient wear appropriate shoes? {Yes,No}. Does the patient need inserts/orthotics? {Yes,No}.  Lab Results  Component Value Date   HGBA1C 6.9 (A) 11/02/2022   No results found. ADA  Risk Categorization: Low Risk :  Patient has all of the following: Intact protective sensation No prior foot ulcer  No severe deformity Pedal pulses present  High Risk  Patient has one or more of the following: Loss of protective sensation Absent pedal pulses Severe Foot deformity History of foot ulcer  Assessment: 1. Onycholysis of toenail      Plan: No orders of the defined types were placed in this encounter.   Meds ordered this encounter  Medications   gentamicin cream (GARAMYCIN) 0.1 %    Sig: Apply to left great toe and left 5th toe once daily.    Dispense:  30 g    Refill:  0    None  {jgplan:23602::"-Patient/POA to call should there be question/concern in the interim."} Return in about 3 months (around 04/16/2023).  Marzetta Board, DPM

## 2023-01-17 ENCOUNTER — Ambulatory Visit (HOSPITAL_COMMUNITY)
Admission: RE | Admit: 2023-01-17 | Discharge: 2023-01-17 | Disposition: A | Discharge status: Home / Self Care | Payer: 59 | Source: Ambulatory Visit | Attending: Surgery | Admitting: Surgery

## 2023-01-17 DIAGNOSIS — E0500 Thyrotoxicosis with diffuse goiter without thyrotoxic crisis or storm: Secondary | ICD-10-CM | POA: Diagnosis not present

## 2023-01-17 DIAGNOSIS — E042 Nontoxic multinodular goiter: Secondary | ICD-10-CM

## 2023-01-18 ENCOUNTER — Ambulatory Visit: Payer: 59 | Attending: Interventional Cardiology | Admitting: Physician Assistant

## 2023-01-18 DIAGNOSIS — Z01810 Encounter for preprocedural cardiovascular examination: Secondary | ICD-10-CM | POA: Diagnosis not present

## 2023-01-18 NOTE — Progress Notes (Signed)
Virtual Visit via Telephone Note   Because of Brittney [name]'s co-morbid illnesses, she is at least at moderate risk for complications without adequate follow up.  This format is felt to be most appropriate for this patient at this time.  The patient did not have access to video technology/had technical difficulties with video requiring transitioning to audio format only (telephone).  All issues noted in this document were discussed and addressed.  No physical exam could be performed with this format.  Please refer to the patient's chart for her consent to telehealth for [facility].  Evaluation Performed:  Preoperative cardiovascular risk assessment _____________   Date:  01/18/2023   Patient ID:  Brittney [name], DOB 1946/08/07, MRN [id number] Patient Location:  Home Provider location:   Office  Primary Care Provider:  Iona Beard, MD Primary Cardiologist:  Carlyle Dolly, MD  Chief Complaint / Patient Profile   77 y.o. y/o female with a h/o HTN, DM, HLD, PAF not on [place] who is pending thyroid surgery and presents today for telephonic preoperative cardiovascular risk assessment.  History of Present Illness    Brittney [name] is a 77 y.o. female who presents via audio/video conferencing for a telehealth visit today.  Pt was last seen in cardiology clinic on 06/20/22 by Nicholes Rough PAC.  At that time ANARELI LOEWER was doing well.  The patient is now pending procedure as outlined above. Since her last visit, she has done well from a cardiovascular standpoint.  She has to hold onto a rail when climbing a flight of stairs.  She does walk the [place] of the grocery store and can do moderate housework cleaning floors and taking out the trash.  She denies exertional angina or worsening shortness of breath.  Past Medical History    Past Medical History:  Diagnosis Date   Atrial fibrillation ([place])    Paroxysmal; LVH; nl EF; onset in 1999   Burning with urination 02/23/2016    Diabetes mellitus    insulin; managed by Dr. 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[facility])    left lower extremity   Hyperlipidemia    Hypertension    Hypothyroidism    history of goiter; nl TSH off medication   Itching with irritation 02/27/2015   Obesity 07/06/2012   Palpitations    negative event recorder in 2009   UTI (urinary tract infection) 02/23/2016   Past Surgical History:  Procedure Laterality Date   BIOPSY THYROID     CESAREAN SECTION     CHOLECYSTECTOMY  11/95   COLONOSCOPY  2007   RETINAL DETACHMENT SURGERY  2009   TUBAL LIGATION  1980's    Allergies  Allergies  Allergen Reactions   Latex     Itching and breaking out   Pravastatin     Pt stated, "Made my muscle ache"    Home Medications    Prior to Admission medications   Medication Sig Start Date End Date Taking? Authorizing Provider  aspirin 81 MG chewable tablet Chew 81 mg by mouth daily.    [provider]  Blood Glucose Monitoring Suppl (ACCU-CHEK GUIDE ME) w/Device KIT Use to check blood sugar 2 times per day dx code E11.65 02/24/21   Elayne Snare, MD  cloNIDine (CATAPRES) 0.1 MG tablet TAKE (1) TABLET BY MOUTH TWICE DAILY. 10/18/22   Elayne Snare, MD  Continuous Blood Gluc Sensor (DEXCOM G7 SENSOR) MISC Change every 10 days 12/29/22   Elayne Snare, MD  gentamicin cream (  GARAMYCIN) 0.1 % Apply to left great toe and left 5th toe once daily. 01/16/23   Marzetta Board, DPM  glucose blood (ACCU-CHEK GUIDE) test strip USE TO TEST BLOOD SUGAR THREE TIMES DAILY AS DIRECTED. 10/28/22   Elayne Snare, MD  HUMALOG KWIKPEN 100 UNIT/ML KwikPen INJECT 6 TO 7 UNITS SUBCUTANEOUSLY BEFORE SUPPER IF GLUCOSE IS HIGH. 11/01/22   Elayne Snare, MD  hydrochlorothiazide (MICROZIDE) 12.5 MG capsule Take 1 capsule (12.5 mg total) by mouth daily. 11/16/22   Elayne Snare, MD  Insulin Pen Needle (B-D ULTRAFINE III SHORT PEN) 31G X 8 MM MISC USE  2-3 TIMES A DAY. 07/15/21   Elayne Snare, MD  Lancets [facility] ULTRASOFT) lancets   04/29/13   [provider]  metoprolol tartrate (LOPRESSOR) 50 MG tablet TAKE 1/2 TABLET BY MOUTH TWICE DAILY. 06/23/22   Arnoldo Lenis, MD  Multiple Vitamin (MULTIVITAMIN) tablet Take 1 tablet by mouth once a week.    [provider]  omeprazole (PRILOSEC) 20 MG capsule Take 20 mg by mouth daily.    [provider]  SURE COMFORT INSULIN SYRINGE 31G X 5/16" 0.3 ML MISC USE AS DIRECTED 2-3 TIMES A DAY. 01/27/20   Elayne Snare, MD  TRESIBA FLEXTOUCH 100 UNIT/ML FlexTouch Pen Inject 30 Units into the skin daily. 12/27/22   [provider]    Physical Exam    Vital Signs:  Cory Munch does not have vital signs available for review today.  Given telephonic nature of communication, physical exam is limited. AAOx3. NAD. Normal affect.  Speech and respirations are unlabored.  Accessory Clinical Findings    None  Assessment & Plan    1.  Preoperative Cardiovascular Risk Assessment:  She does not have a history of ischemic heart disease or CVA.  She has not been diagnosed with heart failure, has normal renal function but does require insulin.  This gives her a 0.9% risk of [place] during the perioperative period.  She reports activity equivalent to 4.0 METS without angina.  We discussed her risk for procedures.  She understands her risk and wishes to proceed.  I do not think that she has any unstable cardiac issues and she may proceed to surgery without additional cardiac testing.  The patient was advised that if she develops new symptoms prior to surgery to contact our office to arrange for a follow-up visit, and she verbalized understanding.  A copy of this note will be routed to requesting surgeon.  Time:   Today, I have spent 10 minutes with the patient with telehealth technology discussing medical history, symptoms, and management plan.     Tami Lin Teng Decou, PA  01/18/2023, 10:16 AM

## 2023-01-24 ENCOUNTER — Ambulatory Visit (INDEPENDENT_AMBULATORY_CARE_PROVIDER_SITE_OTHER): Payer: 59

## 2023-01-24 DIAGNOSIS — E1142 Type 2 diabetes mellitus with diabetic polyneuropathy: Secondary | ICD-10-CM

## 2023-01-24 DIAGNOSIS — M2141 Flat foot [pes planus] (acquired), right foot: Secondary | ICD-10-CM

## 2023-01-24 DIAGNOSIS — E11610 Type 2 diabetes mellitus with diabetic neuropathic arthropathy: Secondary | ICD-10-CM

## 2023-01-24 DIAGNOSIS — M2142 Flat foot [pes planus] (acquired), left foot: Secondary | ICD-10-CM

## 2023-01-26 ENCOUNTER — Telehealth: Payer: Self-pay | Admitting: Cardiology

## 2023-01-26 ENCOUNTER — Ambulatory Visit: Payer: Self-pay | Admitting: Surgery

## 2023-01-26 NOTE — Telephone Encounter (Signed)
Patient is calling to talk with pre-op team regarding medical clearance to have surgery

## 2023-01-26 NOTE — Telephone Encounter (Signed)
Returned patient's call and she realized that she call the wrong office. Patient states that she was supposed to be call requesting surgeon's and not [place]. Patient thanked me for the call.

## 2023-01-31 ENCOUNTER — Other Ambulatory Visit: Payer: Self-pay | Admitting: Endocrinology

## 2023-01-31 ENCOUNTER — Telehealth: Payer: Self-pay

## 2023-01-31 ENCOUNTER — Ambulatory Visit (INDEPENDENT_AMBULATORY_CARE_PROVIDER_SITE_OTHER): Payer: 59 | Admitting: Endocrinology

## 2023-01-31 ENCOUNTER — Encounter: Payer: Self-pay | Admitting: Endocrinology

## 2023-01-31 VITALS — BP 138/80 | HR 70 | Ht 67.0 in | Wt 214.0 lb

## 2023-01-31 DIAGNOSIS — Z794 Long term (current) use of insulin: Secondary | ICD-10-CM | POA: Diagnosis not present

## 2023-01-31 DIAGNOSIS — E042 Nontoxic multinodular goiter: Secondary | ICD-10-CM | POA: Diagnosis not present

## 2023-01-31 DIAGNOSIS — E1165 Type 2 diabetes mellitus with hyperglycemia: Secondary | ICD-10-CM | POA: Diagnosis not present

## 2023-01-31 DIAGNOSIS — I10 Essential (primary) hypertension: Secondary | ICD-10-CM | POA: Diagnosis not present

## 2023-01-31 LAB — T4, FREE: Free T4: 1.27 ng/dL (ref 0.60–1.60)

## 2023-01-31 LAB — POCT GLYCOSYLATED HEMOGLOBIN (HGB A1C): Hemoglobin A1C: 9 % — AB (ref 4.0–5.6)

## 2023-01-31 LAB — POCT GLUCOSE (DEVICE FOR HOME USE): POC Glucose: 140 mg/dl — AB (ref 70–99)

## 2023-01-31 LAB — BASIC METABOLIC PANEL
BUN: 20 mg/dL (ref 6–23)
CO2: 28 mEq/L (ref 19–32)
Calcium: 10.2 mg/dL (ref 8.4–10.5)
Chloride: 103 mEq/L (ref 96–112)
Creatinine, Ser: 0.96 mg/dL (ref 0.40–1.20)
GFR: 57.79 mL/min — ABNORMAL LOW (ref >60.00)
Glucose, Bld: 151 mg/dL — ABNORMAL HIGH (ref 70–99)
Potassium: 4.7 mEq/L (ref 3.5–5.1)
Sodium: 138 mEq/L (ref 135–145)

## 2023-01-31 LAB — T3, FREE: T3, Free: 3.2 pg/mL (ref 2.3–4.2)

## 2023-01-31 LAB — TSH: TSH: <0.01 u[IU]/mL — ABNORMAL LOW (ref 0.35–5.50)

## 2023-01-31 MED ORDER — TIRZEPATIDE 5 MG/0.5ML ~~LOC~~ SOAJ: 5.0000 mg | SUBCUTANEOUS | 2 refills | Status: DC

## 2023-01-31 NOTE — Telephone Encounter (Signed)
Pt was contacted regarding lab results. Pt wanted to follow up regarding her kidney function.

## 2023-01-31 NOTE — Telephone Encounter (Signed)
Pt advised via Mychart message. 

## 2023-01-31 NOTE — Patient Instructions (Signed)
Start Mounjaro 2.5 mg weekly for 4 weeks After that go up to 5 mg weekly  Take HUMALOG consistently before breakfast and with any other meal that has starchy foods or larger meals Do not take Humalog more than 15 minutes after eating  Regular walking for exercise  Avoid excess snacks at bedtime  Check blood sugars on waking up  Also check blood sugars about 2 hours after meals and do this after different meals by rotation  Recommended blood sugar levels on waking up are 90-130 and about 2 hours after meal is 130-160  Please bring your blood sugar monitor to each visit, thank you

## 2023-01-31 NOTE — Progress Notes (Signed)
Patient ID: Brittney [name], female   DOB: June 05, 1946, 77 y.o.   MRN: [id number]     Reason for Appointment: Follow-up visit  History of Present Illness    Type 2 DIABETES MELITUS, date of diagnosis: 1975      She has had long-standing diabetes taking insulin. Because of her difficulties with compliance using mealtime insulin she was given in [place] in 2011 and subsequently Victoza in addition to her Lantus and this helped her control However she tends to be erratic with her day-to-day care and has not had adequate A1c levels in the past, usually over 7%  RECENT history:   Insulin regimen: Tresiba 30 units daily in a.m. Humalog 4-7 units for high sugars  Non-insulin hypoglycemic drugs: Mounjaro not taking,   metformin  850 twice a day  Her A1c is 9% compared to 6.9   Current glucose patterns and problems: She was continuing to get good results from [facility] but even though she was told to restart it after her last visit she did not do so because of reading the information about thyroid nodules on the patient information With this her blood sugars are markedly higher She did not bring her Dexcom reader and not clear what her blood sugars are However her daughter says that she is eating a lot of food late at night because of fear of hypoglycemia overnight She says her fasting readings are as high as 260  Also she thinks that sometime blood sugar may be higher after dinner She is still taking the Humalog only when the blood sugar goes up after eating including at night after dinner Previously she had a tendency to fairly consistently high readings after breakfast She is not doing much exercise also  Today her glucose was 140 fasting in the office late morning  Side effects from medications:  Invokana: ? Headache.  Victoza ?  Swelling in neck   Previous Dexcom data:  CGM use % of time 93  2-week average/GV 189/20  Time in range      46%  % Time Above 180 47  % Time  above 250 7  % Time Below 70 0       Meals: 2-3 meals per day. . Dinner 6-7 pm, variable quantity and carbohydrates  Does have protein with breakfast consistently, has egg for protein   Wt Readings from Last 3 Encounters:  01/31/23 214 lb (97.1 kg)  11/02/22 215 lb 3.2 oz (97.6 kg)  09/14/22 217 lb (98.4 kg)   GLYCEMIC CONTROL:  Lab Results  Component Value Date   HGBA1C 9.0 (A) 01/31/2023   HGBA1C 6.9 (A) 11/02/2022   HGBA1C 6.7 (A) 08/09/2022   Lab Results  Component Value Date   MICROALBUR 0.9 11/02/2022   LDLCALC 65 08/09/2022   CREATININE 0.96 11/02/2022   Lab Results  Component Value Date   FRUCTOSAMINE 345 (H) 03/30/2020   FRUCTOSAMINE 342 (H) 09/08/2017      Other problems discussed today: See review of systems   Allergies as of 01/31/2023       Reactions   Latex    Itching and breaking out   Pravastatin    Pt stated, "Made my muscle ache"        Medication List        Accurate as of January 31, 2023  9:32 AM. If you have any questions, ask your nurse or doctor.          Accu-Chek Guide Me w/Device  Kit Use to check blood sugar 2 times per day dx code E11.65   Accu-Chek Guide test strip Generic drug: glucose blood USE TO TEST BLOOD SUGAR THREE TIMES DAILY AS DIRECTED.   aspirin 81 MG chewable tablet Chew 81 mg by mouth daily.   B-D ULTRAFINE III SHORT PEN 31G X 8 MM Misc Generic drug: Insulin Pen Needle USE  2-3 TIMES A DAY.   cloNIDine 0.1 MG tablet Commonly known as: CATAPRES TAKE (1) TABLET BY MOUTH TWICE DAILY.   Dexcom G7 Sensor Misc Change every 10 days   gentamicin cream 0.1 % Commonly known as: GARAMYCIN Apply to left great toe and left 5th toe once daily.   HumaLOG KwikPen 100 UNIT/ML KwikPen Generic drug: insulin lispro INJECT 6 TO 7 UNITS SUBCUTANEOUSLY BEFORE SUPPER IF GLUCOSE IS HIGH.   hydrochlorothiazide 12.5 MG capsule Commonly known as: MICROZIDE Take 1 capsule (12.5 mg total) by mouth daily.   metoprolol  tartrate 50 MG tablet Commonly known as: LOPRESSOR TAKE 1/2 TABLET BY MOUTH TWICE DAILY.   multivitamin tablet Take 1 tablet by mouth once a week.   omeprazole 20 MG capsule Commonly known as: PRILOSEC Take 20 mg by mouth daily.   onetouch ultrasoft lancets   Sure Comfort Insulin Syringe 31G X 5/16" 0.3 ML Misc Generic drug: Insulin Syringe-Needle U-100 USE AS DIRECTED 2-3 TIMES A DAY.   Tyler Aas FlexTouch 100 UNIT/ML FlexTouch Pen Generic drug: insulin degludec Inject 30 Units into the skin daily.        Allergies:  Allergies  Allergen Reactions   Latex     Itching and breaking out   Pravastatin     Pt stated, "Made my muscle ache"    Past Medical History:  Diagnosis Date   Atrial fibrillation ([facility])    Paroxysmal; LVH; nl EF; onset in 1999   Burning with urination 02/23/2016   Diabetes mellitus    insulin; managed by Dr. 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[facility])    left lower extremity   Hyperlipidemia    Hypertension    Hypothyroidism    history of goiter; nl TSH off medication   Itching with irritation 02/27/2015   Obesity 07/06/2012   Palpitations    negative event recorder in 2009   UTI (urinary tract infection) 02/23/2016    Past Surgical History:  Procedure Laterality Date   BIOPSY THYROID     CESAREAN SECTION     CHOLECYSTECTOMY  11/95   COLONOSCOPY  2007   RETINAL DETACHMENT SURGERY  2009   TUBAL LIGATION  1980's    Family History  Problem Relation Age of Onset   Diabetes Mother    Diabetes Daughter        gestational diabetes   Cancer Father    Thyroid disease Brother    Other Son        MVA    Social History:  reports that she quit smoking about 36 years ago. Her smoking use included cigarettes. She started smoking about 56 years ago. She smoked an average of 1 pack per day. She has never used smokeless tobacco. She reports that she does not drink alcohol and does not use drugs.  Review of Systems:   HYPERTENSION:  Previously  followed by PCP, had been on Norvasc, MAXZIDE and Benicar  Because of high potassium and increased creatinine her Benicar and Maxzide had been stopped She is on the clonidine 0.1 mg bid along, also on 50 mg Toprol She was told to  resume HCTZ on her last visit and blood pressure is better  Previously on the clonidine patch she had itching at the site   BP Readings from Last 3 Encounters:  01/31/23 138/80  01/16/23 (!) 160/92  11/02/22 (!) 150/70   Renal function:   Lab Results  Component Value Date   CREATININE 0.96 11/02/2022   CREATININE 0.91 08/09/2022   CREATININE 1.49 (H) 05/09/2022     HYPERLIPIDEMIA: The lipid abnormality consists of elevated LDL; she stopped her pravastatin because of symptoms of muscle aches and cramping Now taking simvastatin 2/7 days a week without side effects   Lab Results  Component Value Date   CHOL 152 08/09/2022   HDL 58.20 08/09/2022   LDLCALC 65 08/09/2022   TRIG 142.0 08/09/2022   CHOLHDL 3 08/09/2022    MULTINODULAR GOITER: She has had a long-standing multinodular goiter since at least 2003  She does have history of occasional palpitations for years, not worse recently Metoprolol has been prescribed by cardiologist  As before she does not complain of local pressure symptoms in the neck or swallowing difficulty Because of the large size of the goiter she was interested in getting surgery done since her I-131 uptake was not conducive to I-131 treatment She is now having second thoughts about surgery which is scheduled for later this month  Her goiter has been autonomous with no evidence of overt hyperthyroidism  with normal free T4 and free T3 levels; TSH has been consistently suppressed  Her I-131 uptake in 2023 was 20 % with patchy uptake   Compared to thyroid ultrasound in 2014 recent did not show change in her nodules except the right-sided nodule is now 5.7 cm compared to 4.2 cm previously but radiologist did not feel this was  accurate comparison  She and her family are asking about her labs and was told by the surgeon to take antithyroid drugs before surgery  Lab Results  Component Value Date   TSH <0.01 (L) 08/09/2022   TSH <0.01 (L) 05/09/2022   FREET4 0.95 08/09/2022   FREET4 1.32 05/09/2022   Lab Results  Component Value Date   T3FREE 2.8 08/09/2022   T3FREE 3.5 05/09/2022   T3FREE 3.1 12/24/2021       Foot exam done 12/21/2021 She has some numbness in her toes which is mild and chronic  She uses Aspercreme given by podiatrist  She has been using diabetic shoes  She is periodically seen by podiatrist   Examination:   BP 138/80 (BP Location: Left Arm, Patient Position: Sitting, Cuff Size: Normal)   Pulse 70   Ht '5\' 7"'$  (1.702 m)   Wt 214 lb (97.1 kg)   SpO2 93%   BMI 33.52 kg/m   Body mass index is 33.52 kg/m.   She has a very large goiter especially on the right side  ASSESSMENT/ PLAN:  Diabetes Type 2 with obesity on insulin, Mounjaro and metformin  See history of present illness for detailed discussion of  current management, blood sugar patterns and problems identified  Her A1c is much higher at 9 compared to 6.9  She was doing well with Mounjaro but now her blood sugars are significantly higher and she did not call to report this Also not taking Humalog proactively before meals causing hypoglycemia later  Recommendations:  RESTART Mounjaro but she can start with 2.5 before going back to 5 mg Mounjaro Needs to be walking for exercise Make sure she takes Humalog before her first meal unless she  is eating low carbohydrate meals are mostly protein in the morning Also if she is eating carbohydrates at lunch or dinner she will need to take at least 4 units coverage otherwise for larger meals next 7 units  No change in [place] unless blood sugars are out of range in the morning after starting Mounjaro  MULTINODULAR goiter: She is a good candidate for radiofrequency ablation of the  dominant right-sided nodule which is most prominent and cosmetically also visible as well as showing some possible increase in size compared to ultrasound of 2014 Will discuss this with interventional radiologist and likely can avoid surgery, patient is accepting this better than going through surgery  HYPERTENSION: Blood pressure is better controlled with adding HCTZ    There are no Patient Instructions on file for this visit.    Elayne Snare 01/31/2023, 9:32 AM   Addendum: Thyroid levels are similar to previous and renal function normal

## 2023-02-01 ENCOUNTER — Telehealth: Payer: Self-pay | Admitting: Surgery

## 2023-02-01 NOTE — Telephone Encounter (Signed)
     Received message from Dr. Dwyane Dee:  Brittney Snare, MD  Armandina Gemma, MD Dijuan Sleeth: Patient and her family came in today and were getting nervous about her thyroidectomy.  Looking at the recent ultrasound only the right dominant nodule appears to be larger at 5.7 cm compared to 2014 and likely can be treated by radiofrequency ablation.  The patient agrees to consult Dr. Serafina Royals but I think we can put off her surgery until this evaluation is done, thanks.  Also repeat thyroid levels are about the same.  Thank you for your input   Will cancel scheduled thyroid surgery on 02/13/2023.  Patient scheduled for consultation with radiology on 02/10/2023.  Surgery will see patient prn.  Armandina Gemma, MD [facility] [place] [facility] Office: [phone]

## 2023-02-01 NOTE — Telephone Encounter (Signed)
Pt informed, agreed and expressed her understanding.

## 2023-02-09 NOTE — Progress Notes (Signed)
Patient presents to the office today for diabetic shoe and insole measuring.  Patient was measured with brannock device to determine size and width for 1 pair of extra depth shoes and foam casted for 3 pair of insoles.   ABN signed.   Documentation of medical necessity will be sent to patient's treating diabetic doctor to verify and sign.   Patient's diabetic provider: DR Dwyane Dee  Shoes and insoles will be ordered at that time and patient will be notified for an appointment for fitting when they arrive.   Patient shoe selection-   1st   Shoe choice:   [id number]  Shoe size ordered: 11 W

## 2023-02-10 ENCOUNTER — Other Ambulatory Visit: Payer: Self-pay | Admitting: Endocrinology

## 2023-02-10 ENCOUNTER — Ambulatory Visit
Admission: RE | Admit: 2023-02-10 | Discharge: 2023-02-10 | Disposition: A | Discharge status: Home / Self Care | Payer: 59 | Source: Ambulatory Visit | Attending: Endocrinology | Admitting: Endocrinology

## 2023-02-10 DIAGNOSIS — E011 Iodine-deficiency related multinodular (endemic) goiter: Secondary | ICD-10-CM | POA: Diagnosis not present

## 2023-02-10 DIAGNOSIS — E042 Nontoxic multinodular goiter: Secondary | ICD-10-CM

## 2023-02-10 HISTORY — PX: IR RADIOLOGIST EVAL & MGMT: IMG5224

## 2023-02-10 NOTE — Consult Note (Signed)
Chief Complaint: Patient was seen in virtual telephone consultation today for symptomatic thyroid nodule  Referring Physician(s): Kumar,Ajay  History of Present Illness: Brittney [name] is a 77 y.o. female with history of multinodular goiter.  She has remained euthyroid with laboratory subclinical hyperthyroidism currently.  She has a history of palpitations, likely attributed to her thyroid, which are under control with metoprolol 25 mg QD.  She reports her thyroid has been enlarged for 20 years.  In 2003, she had a negative FNA on a nodule in the left thyroid.  She denies any compressive symptoms such as dysphagia, voice changes, or dyspnea.  The right side of her thyroid bothers her more than the left. She has met with Dr. Harlow Asa to consider thyroidectomy, however more minimally invasive options are being considered given her age and comorbidities.   Thyroid Symptom Score: 8/10  Thyroid Cosmetic Score:  Readily detected cosmetic problem (Grade 4).  Past Medical History:  Diagnosis Date   Atrial fibrillation ([place])    Paroxysmal; LVH; nl EF; onset in 1999   Burning with urination 02/23/2016   Diabetes mellitus    insulin; managed by Dr. 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 Authorizing Provider  aspirin 81 MG chewable tablet Chew 81 mg by mouth daily.    [provider]  Blood Glucose Monitoring Suppl (ACCU-CHEK GUIDE ME) w/Device KIT Use to check blood sugar 2 times per day dx code E11.65 02/24/21   Elayne Snare, MD  cloNIDine (CATAPRES) 0.1 MG tablet TAKE (1) TABLET BY MOUTH TWICE DAILY. 10/18/22   Elayne Snare, MD  Continuous Blood Gluc Sensor (DEXCOM G7 SENSOR) MISC Change every 10 days 12/29/22   Elayne Snare, MD  gentamicin cream (GARAMYCIN) 0.1 % Apply to left great toe and left 5th toe once daily. 01/16/23   Marzetta Board, DPM  glucose blood (ACCU-CHEK GUIDE) test strip USE TO TEST BLOOD SUGAR THREE TIMES DAILY AS DIRECTED. 10/28/22   Elayne Snare, MD  HUMALOG KWIKPEN 100 UNIT/ML KwikPen INJECT 6 TO 7 UNITS SUBCUTANEOUSLY BEFORE SUPPER IF GLUCOSE IS HIGH. 11/01/22   Elayne Snare, MD  hydrochlorothiazide (MICROZIDE) 12.5 MG capsule Take 1 capsule (12.5 mg total) by mouth daily. 11/16/22   Elayne Snare, MD  Insulin Pen Needle (B-D ULTRAFINE III SHORT PEN) 31G X 8 MM MISC USE  2-3 TIMES A DAY. 07/15/21   Elayne Snare, MD  Lancets [facility] ULTRASOFT) lancets  04/29/13   [provider]  metoprolol tartrate (LOPRESSOR) 50 MG tablet TAKE 1/2 TABLET BY MOUTH TWICE DAILY. 06/23/22   Arnoldo Lenis, MD  Multiple Vitamin (MULTIVITAMIN) tablet Take 1 tablet by mouth once a week.    [provider]  omeprazole (PRILOSEC) 20 MG capsule Take 20 mg by mouth daily.  [provider]  SURE COMFORT INSULIN SYRINGE 31G X 5/16" 0.3 ML MISC USE AS DIRECTED 2-3 TIMES A DAY. 01/27/20   Elayne Snare, MD  tirzepatide [facility]) 5 MG/0.5ML Pen Inject 5 mg into the skin once a week. 01/31/23   Elayne Snare, MD  TRESIBA FLEXTOUCH 100 UNIT/ML FlexTouch Pen Inject 30 Units into the skin daily. 12/27/22   [provider]     Family History  Problem Relation Age of Onset   Diabetes Mother    Diabetes Daughter        gestational diabetes   Cancer Father    Thyroid disease Brother    Other Son        MVA    Social History   Socioeconomic  History   Marital status: Legally Separated    Spouse name: Not on file   Number of children: Not on file   Years of education: Not on file   Highest education level: Not on file  Occupational History   Occupation: Retired  Tobacco Use   Smoking status: Former    Packs/day: 1    Types: Cigarettes    Start date: 11/21/1966    Quit date: 08/13/1986    Years since quitting: 36.5   Smokeless tobacco: Never  Vaping Use   Vaping Use: Never used  Substance and Sexual Activity   Alcohol use: No    Alcohol/week: 0.0 standard drinks of alcohol   Drug use: No   Sexual activity: Not Currently    Birth control/protection: Post-menopausal, Surgical    Comment: tubal  Other Topics Concern   Not on file  Social History Narrative   No regular exercise   Social Determinants of Health   Financial Resource Strain: Not on file  Food Insecurity: Not on file  Transportation Needs: Not on file  Physical Activity: Not on file  Stress: Not on file  Social Connections: Not on file     Review of Systems: A 12 point ROS discussed and pertinent positives are indicated in the HPI above.  All other systems are negative.  Vital Signs: There were no vitals taken for this visit.  Imaging:  NM Thyroid Uptake (I-123) (11/18/22) IMPRESSION: Multinodular thyroid gland with cold nodules at inferior RIGHT lobe and probably superior LEFT lobe; ultrasound evaluation recommended to exclude malignancy. Normal 4 hour and 24 hour radio iodine uptakes.   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  She is interested in pursuing thyroid RFA.  We discussed first focusing on the dominant right sided lesion, given her symptoms correlate predominately with the right side, then we could address other nodules in the future depending on her symptoms.  Since she has not had a right nodule biopsy, we will need 2 negative biopsies prior to being able to proceed with RFA.  We discussed risks and benefits of radiofrequency thyroid ablation to most commonly include discomfort/pain (2.6%), followed by less commonly (all 1.0% or less) voice change,  nodule rupture, infection, hypothyroidism, brachial plexus injury, hematoma, vomiting, and skin burn.   She is amenable and wishes to proceed with this plan.  Plan for right (labeled 1, 5.7 cm, TR3) ultrasound guided fine needle aspiration with Affirma x2.  These can be scheduled 2 weeks apart.  Once resulted, if benign, we will proceed with scheduling RFA at [facility].  She will need to hold aspirin for 3 days prior to RFA.   Electronically Signed: Suzette Battiest, MD 02/10/2023, 8:45 AM   I spent a total of  40 Minutes  in virtual telephone clinical consultation, greater than 50% of which was counseling/coordinating care for multinodular goiter.

## 2023-02-13 ENCOUNTER — Other Ambulatory Visit: Payer: Self-pay | Admitting: Interventional Radiology

## 2023-02-13 ENCOUNTER — Ambulatory Visit: Admit: 2023-02-13 | Payer: 59 | Admitting: Surgery

## 2023-02-13 DIAGNOSIS — E041 Nontoxic single thyroid nodule: Secondary | ICD-10-CM

## 2023-02-13 SURGERY — THYROIDECTOMY
Anesthesia: General

## 2023-02-24 ENCOUNTER — Ambulatory Visit
Admission: RE | Admit: 2023-02-24 | Discharge: 2023-02-24 | Disposition: A | Discharge status: Home / Self Care | Payer: 59 | Source: Ambulatory Visit | Attending: Interventional Radiology | Admitting: Interventional Radiology

## 2023-02-24 ENCOUNTER — Other Ambulatory Visit (HOSPITAL_COMMUNITY)
Admission: RE | Admit: 2023-02-24 | Discharge: 2023-02-24 | Disposition: A | Discharge status: Home / Self Care | Payer: 59 | Source: Ambulatory Visit | Attending: Interventional Radiology | Admitting: Interventional Radiology

## 2023-02-24 DIAGNOSIS — E041 Nontoxic single thyroid nodule: Secondary | ICD-10-CM | POA: Diagnosis not present

## 2023-02-28 LAB — CYTOLOGY - NON PAP

## 2023-03-13 ENCOUNTER — Other Ambulatory Visit (HOSPITAL_COMMUNITY)
Admission: RE | Admit: 2023-03-13 | Discharge: 2023-03-13 | Disposition: A | Discharge status: Home / Self Care | Payer: 59 | Source: Ambulatory Visit | Attending: Diagnostic Radiology | Admitting: Diagnostic Radiology

## 2023-03-13 ENCOUNTER — Ambulatory Visit
Admission: RE | Admit: 2023-03-13 | Discharge: 2023-03-13 | Disposition: A | Discharge status: Home / Self Care | Payer: 59 | Source: Ambulatory Visit | Attending: Interventional Radiology | Admitting: Interventional Radiology

## 2023-03-13 DIAGNOSIS — E041 Nontoxic single thyroid nodule: Secondary | ICD-10-CM | POA: Diagnosis not present

## 2023-03-15 LAB — CYTOLOGY - NON PAP

## 2023-03-16 ENCOUNTER — Other Ambulatory Visit: Payer: Self-pay | Admitting: Cardiology

## 2023-03-20 ENCOUNTER — Ambulatory Visit (INDEPENDENT_AMBULATORY_CARE_PROVIDER_SITE_OTHER): Payer: 59 | Admitting: Orthopaedic Surgery

## 2023-03-20 ENCOUNTER — Other Ambulatory Visit (HOSPITAL_COMMUNITY): Payer: Self-pay | Admitting: Interventional Radiology

## 2023-03-20 DIAGNOSIS — M1712 Unilateral primary osteoarthritis, left knee: Secondary | ICD-10-CM | POA: Diagnosis not present

## 2023-03-20 DIAGNOSIS — M25562 Pain in left knee: Secondary | ICD-10-CM | POA: Diagnosis not present

## 2023-03-20 DIAGNOSIS — G8929 Other chronic pain: Secondary | ICD-10-CM

## 2023-03-20 DIAGNOSIS — E041 Nontoxic single thyroid nodule: Secondary | ICD-10-CM

## 2023-03-20 MED ORDER — METHYLPREDNISOLONE ACETATE 40 MG/ML IJ SUSP
40.0000 mg | INTRAMUSCULAR | Status: AC | PRN
Start: 2023-03-20 — End: 2023-03-20
  Administered 2023-03-20: 40 mg via INTRA_ARTICULAR

## 2023-03-20 MED ORDER — LIDOCAINE HCL 1 % IJ SOLN
3.0000 mL | INTRAMUSCULAR | Status: AC | PRN
Start: 2023-03-20 — End: 2023-03-20
  Administered 2023-03-20: 3 mL

## 2023-03-20 NOTE — Progress Notes (Signed)
The patient is a 77 year old female well-known to [place].  She has debilitating arthritis of her left knee this been well-documented.  X-rays even last year showed complete loss of joint space.  We did place a steroid injection in her left knee last year that helped.  She is dealing with significant thyroid disease.  She is a diabetic and has not been under great control.  Hemoglobin A1c last month was 9.  Her blood glucose this morning was just over 200 after she ate.  Her daughter is with her today as well.  On exam she has limited range of motion of her left knee and you can tell is definitely painful to her.  Again her previous x-rays showed bone-on-bone wear throughout the knee.  She understands the hyaluronic acid will have no effect at this point.  I did agree with proceeding with a steroid injection today since she does monitor her blood glucose on a daily basis and does have insulin.  She will watch this closely.  She understands that this could absolutely lead her into much higher blood glucose levels.  I did place a steroid without difficulty.    Procedure Note  Patient: Brittney [name]             Date of Birth: 1946/06/30           MRN: [id number]             Visit Date: 03/20/2023  Procedures: Visit Diagnoses:  1. Unilateral primary osteoarthritis, left knee   2. Chronic pain of left knee     Large Joint Inj: L knee on 03/20/2023 10:26 AM Indications: diagnostic evaluation and pain Details: 22 G 1.5 in needle, superolateral approach  Arthrogram: No  Medications: 3 mL lidocaine 1 %; 40 mg methylPREDNISolone acetate 40 MG/ML Outcome: tolerated well, no immediate complications Procedure, treatment alternatives, risks and benefits explained, specific risks discussed. Consent was given by the patient. Immediately prior to procedure a time out was called to verify the correct patient, procedure, equipment, support staff and site/side marked as required. Patient was prepped and draped in  the usual sterile fashion.

## 2023-03-22 ENCOUNTER — Other Ambulatory Visit: Payer: Self-pay | Admitting: Endocrinology

## 2023-03-23 ENCOUNTER — Other Ambulatory Visit: Payer: 59

## 2023-04-06 ENCOUNTER — Ambulatory Visit (HOSPITAL_COMMUNITY): Payer: 59

## 2023-04-06 ENCOUNTER — Ambulatory Visit (HOSPITAL_COMMUNITY)
Admission: RE | Admit: 2023-04-06 | Discharge: 2023-04-06 | Disposition: A | Discharge status: Home / Self Care | Payer: 59 | Source: Ambulatory Visit | Attending: Interventional Radiology | Admitting: Interventional Radiology

## 2023-04-06 DIAGNOSIS — E041 Nontoxic single thyroid nodule: Secondary | ICD-10-CM | POA: Diagnosis not present

## 2023-04-06 DIAGNOSIS — E042 Nontoxic multinodular goiter: Secondary | ICD-10-CM | POA: Insufficient documentation

## 2023-04-06 MED ORDER — LIDOCAINE HCL (PF) 1 % IJ SOLN
INTRAMUSCULAR | Status: AC | PRN
  Administered 2023-04-06: 20 mL

## 2023-04-10 DIAGNOSIS — N182 Chronic kidney disease, stage 2 (mild): Secondary | ICD-10-CM | POA: Diagnosis not present

## 2023-04-10 DIAGNOSIS — E1122 Type 2 diabetes mellitus with diabetic chronic kidney disease: Secondary | ICD-10-CM | POA: Diagnosis not present

## 2023-04-10 DIAGNOSIS — E7800 Pure hypercholesterolemia, unspecified: Secondary | ICD-10-CM | POA: Diagnosis not present

## 2023-04-10 DIAGNOSIS — I10 Essential (primary) hypertension: Secondary | ICD-10-CM | POA: Diagnosis not present

## 2023-04-21 DIAGNOSIS — I10 Essential (primary) hypertension: Secondary | ICD-10-CM | POA: Diagnosis not present

## 2023-04-21 DIAGNOSIS — E1142 Type 2 diabetes mellitus with diabetic polyneuropathy: Secondary | ICD-10-CM | POA: Diagnosis not present

## 2023-04-26 ENCOUNTER — Encounter: Payer: Self-pay | Admitting: Endocrinology

## 2023-04-26 DIAGNOSIS — I10 Essential (primary) hypertension: Secondary | ICD-10-CM

## 2023-05-03 ENCOUNTER — Ambulatory Visit (INDEPENDENT_AMBULATORY_CARE_PROVIDER_SITE_OTHER): Payer: 59

## 2023-05-03 ENCOUNTER — Ambulatory Visit (INDEPENDENT_AMBULATORY_CARE_PROVIDER_SITE_OTHER): Payer: 59 | Admitting: Endocrinology

## 2023-05-03 VITALS — BP 121/76 | HR 83 | Ht 67.0 in | Wt 207.0 lb

## 2023-05-03 DIAGNOSIS — I10 Essential (primary) hypertension: Secondary | ICD-10-CM

## 2023-05-03 DIAGNOSIS — E0590 Thyrotoxicosis, unspecified without thyrotoxic crisis or storm: Secondary | ICD-10-CM

## 2023-05-03 DIAGNOSIS — E042 Nontoxic multinodular goiter: Secondary | ICD-10-CM

## 2023-05-03 DIAGNOSIS — Z794 Long term (current) use of insulin: Secondary | ICD-10-CM

## 2023-05-03 DIAGNOSIS — E1165 Type 2 diabetes mellitus with hyperglycemia: Secondary | ICD-10-CM

## 2023-05-03 LAB — BASIC METABOLIC PANEL
BUN: 21 mg/dL (ref 6–23)
CO2: 28 mEq/L (ref 19–32)
Calcium: 9.4 mg/dL (ref 8.4–10.5)
Chloride: 103 mEq/L (ref 96–112)
Creatinine, Ser: 1.28 mg/dL — ABNORMAL HIGH (ref 0.40–1.20)
GFR: 40.85 mL/min — ABNORMAL LOW (ref >60.00)
Glucose, Bld: 181 mg/dL — ABNORMAL HIGH (ref 70–99)
Potassium: 5.1 mEq/L (ref 3.5–5.1)
Sodium: 138 mEq/L (ref 135–145)

## 2023-05-03 LAB — POCT GLYCOSYLATED HEMOGLOBIN (HGB A1C): Hemoglobin A1C: 6.9 % — AB (ref 4.0–5.6)

## 2023-05-03 LAB — TSH: TSH: 0 u[IU]/mL — ABNORMAL LOW (ref 0.35–5.50)

## 2023-05-03 LAB — T3, FREE: T3, Free: 3.4 pg/mL (ref 2.3–4.2)

## 2023-05-03 LAB — T4, FREE: Free T4: 1.41 ng/dL (ref 0.60–1.60)

## 2023-05-03 MED ORDER — SIMVASTATIN 10 MG PO TABS
10.0000 mg | ORAL_TABLET | Freq: Every day | ORAL | 3 refills | Status: DC
Start: 2023-05-03 — End: 2023-05-26

## 2023-05-03 NOTE — Progress Notes (Signed)
Patient ID: Brittney [name], female   DOB: 1946-03-05, 77 y.o.   MRN: [id number]     Reason for Appointment: Follow-up visit  History of Present Illness    Type 2 DIABETES MELITUS, date of diagnosis: 1975      She has had long-standing diabetes taking insulin. Because of her difficulties with compliance using mealtime insulin she was given in [place] in 2011 and subsequently Victoza in addition to her Lantus and this helped her control However she tends to be erratic with her day-to-day care and has not had adequate A1c levels in the past, usually over 7%  RECENT history:   Insulin regimen: Tresiba units daily 20 in a.m. Humalog 4-7 units as needed high sugars  Non-insulin hypoglycemic drugs: Mounjaro 5 mg weekly  A1c is back to 6.9 compared to 9%   Current glucose patterns and problems: She was told to start back on Mounjaro after last visit after reassuring start it was not going to affect her thyroid With going back to 5 mg she says she has better satiety and has lost 7 pounds She does complain of constipation with this However her blood sugars are relatively higher; this may be related to her arbitrarily reducing her Tresiba to 20 units instead of 30 even though she was told not to reduce it unless blood sugars are low She says she is eating a snack like peanut butter crackers during the night for fear of hypoglycemia even her blood sugars are consistently in the 150-160 range overnight She will still have some higher readings after her late morning meals at times but not as much as before with using Mounjaro Again not using Humalog at meals  She says she has started walking a little also Time in range on her sensor is much improved at 74% compared to 46 Previously on metformin and not clear why this has been stopped  Side effects from medications:  Invokana: ? Headache.  Victoza ?  Swelling in neck   Interpretation of the last 2 weeks Dexcom data:   Average  blood sugars are evenly controlled throughout the day and night and averaging in the upper end of the target range HYPERGLYCEMIC episodes are occurring inconsistently during the day especially after breakfast and occasionally after lunch as well as only once overnight Generally overnight blood sugars are stable and in the 150-160 average range without hypoglycemia On an average blood sugars are the highest in the early afternoon after her meal but not as consistently after dinner Premeal blood sugars may be occasionally low normal late afternoon but no hypoglycemia Overall variability is still controlled  CGM use % of time 96  2-week average/GV 162/20  Time in range 74%  % Time Above 180 25+1  % Time above 250   % Time Below 70      Previously:  CGM use % of time 93  2-week average/GV 189/20  Time in range      46%  % Time Above 180 47  % Time above 250 7  % Time Below 70 0       Meals: 2-3 meals per day. . Dinner 6-7 pm, variable quantity and carbohydrates  Does have protein with breakfast consistently, has egg for protein   Wt Readings from Last 3 Encounters:  05/03/23 207 lb (93.9 kg)  01/31/23 214 lb (97.1 kg)  11/02/22 215 lb 3.2 oz (97.6 kg)   GLYCEMIC CONTROL:  Lab Results  Component Value Date  HGBA1C 6.9 (A) 05/03/2023   HGBA1C 9.0 (A) 01/31/2023   HGBA1C 6.9 (A) 11/02/2022   Lab Results  Component Value Date   MICROALBUR 0.9 11/02/2022   LDLCALC 65 08/09/2022   CREATININE 0.96 01/31/2023   Lab Results  Component Value Date   FRUCTOSAMINE 345 (H) 03/30/2020   FRUCTOSAMINE 342 (H) 09/08/2017      Other problems discussed today: See review of systems   Allergies as of 05/03/2023       Reactions   Latex    Itching and breaking out   Pravastatin    Pt stated, "Made my muscle ache"        Medication List        Accurate as of May 03, 2023 10:14 AM. If you have any questions, ask your nurse or doctor.          Accu-Chek Guide Me  w/Device Kit Use to check blood sugar 2 times per day dx code E11.65   Accu-Chek Guide test strip Generic drug: glucose blood USE TO TEST BLOOD SUGAR THREE TIMES DAILY AS DIRECTED.   aspirin 81 MG chewable tablet Chew 81 mg by mouth daily.   B-D ULTRAFINE III SHORT PEN 31G X 8 MM Misc Generic drug: Insulin Pen Needle USE  2-3 TIMES A DAY.   cloNIDine 0.1 MG tablet Commonly known as: CATAPRES TAKE (1) TABLET BY MOUTH TWICE DAILY.   Dexcom G7 Sensor Misc Change every 10 days   gentamicin cream 0.1 % Commonly known as: GARAMYCIN Apply to left great toe and left 5th toe once daily.   HumaLOG KwikPen 100 UNIT/ML KwikPen Generic drug: insulin lispro INJECT 6 TO 7 UNITS SUBCUTANEOUSLY BEFORE SUPPER IF GLUCOSE IS HIGH.   hydrochlorothiazide 12.5 MG capsule Commonly known as: MICROZIDE TAKE ONE CAPSULE BY MOUTH ONCE DAILY.   metoprolol tartrate 50 MG tablet Commonly known as: LOPRESSOR TAKE 1/2 TABLET BY MOUTH TWICE DAILY.   multivitamin tablet Take 1 tablet by mouth once a week.   omeprazole 20 MG capsule Commonly known as: PRILOSEC Take 20 mg by mouth daily.   onetouch ultrasoft lancets   simvastatin 10 MG tablet Commonly known as: ZOCOR Take 1 tablet (10 mg total) by mouth at bedtime.   Sure Comfort Insulin Syringe 31G X 5/16" 0.3 ML Misc Generic drug: Insulin Syringe-Needle U-100 USE AS DIRECTED 2-3 TIMES A DAY.   tirzepatide 5 MG/0.5ML Pen Commonly known as: MOUNJARO Inject 5 mg into the skin once a week.   Evaristo Bury FlexTouch 100 UNIT/ML FlexTouch Pen Generic drug: insulin degludec INJECT 30 UNITS SUBCUTANEOUSLY ONCE EVERY MORNING.        Allergies:  Allergies  Allergen Reactions   Latex     Itching and breaking out   Pravastatin     Pt stated, "Made my muscle ache"    Past Medical History:  Diagnosis Date   Atrial fibrillation ([facility])    Paroxysmal; LVH; nl EF; onset in 1999   Burning with urination 02/23/2016   Diabetes mellitus    insulin;  managed by Dr. 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   Hyperlipidemia    Hypertension    Hypothyroidism    history of goiter; nl TSH off medication   Itching with irritation 02/27/2015   Obesity 07/06/2012   Palpitations    negative event recorder in 2009   UTI (urinary tract infection) 02/23/2016    Past Surgical History:  Procedure Laterality Date   BIOPSY  THYROID     CESAREAN SECTION     CHOLECYSTECTOMY  11/95   COLONOSCOPY  2007   IR RADIOLOGIST EVAL & MGMT  02/10/2023   RETINAL DETACHMENT SURGERY  2009   TUBAL LIGATION  1980's    Family History  Problem Relation Age of Onset   Diabetes Mother    Diabetes Daughter        gestational diabetes   Cancer Father    Thyroid disease Brother    Other Son        MVA    Social History:  reports that she quit smoking about 36 years ago. Her smoking use included cigarettes. She started smoking about 56 years ago. She smoked an average of 1 pack per day. She has never used smokeless tobacco. She reports that she does not drink alcohol and does not use drugs.  Review of Systems:   HYPERTENSION:  Previously followed by PCP, had been on Norvasc, MAXZIDE and Benicar  Because of high potassium and increased creatinine her Benicar and Maxzide had been stopped Previously on the clonidine patch she had itching at the site She is on the clonidine 0.1 mg bid along, also on 50 mg Toprol and HCTZ; with this blood pressure is well-controlled    BP Readings from Last 3 Encounters:  05/03/23 121/76  04/06/23 (!) 160/64  01/31/23 138/80   Renal function:   Lab Results  Component Value Date   CREATININE 0.96 01/31/2023   CREATININE 0.96 11/02/2022   CREATININE 0.91 08/09/2022     HYPERLIPIDEMIA: The lipid abnormality consists of elevated LDL; she stopped her pravastatin because of symptoms of muscle aches and cramping She was taking simvastatin 2/7 days a week without side effects but now she is not  taking it claiming that it may have Caused muscle aches with regular administration   Lab Results  Component Value Date   CHOL 152 08/09/2022   HDL 58.20 08/09/2022   LDLCALC 65 08/09/2022   TRIG 142.0 08/09/2022   CHOLHDL 3 08/09/2022    MULTINODULAR GOITER: She has had a long-standing multinodular goiter since at least 2003  She does have history of occasional palpitations for years, not worse recently Metoprolol has been prescribed by cardiologist Because of the large size of the goiter she was interested in getting surgery done since her I-131 uptake was not conducive to I-131 treatment She was hesitant about the surgery she has been referred for radiofrequency ablation  She had radiofrequency ablation done of her right dominant thyroid nodule which is measuring 5.7 cm across She feels that her thyroid swelling has gone down significantly  Her goiter has been autonomous with no evidence of overt hyperthyroidism  with normal free T4 and free T3 levels; TSH has been consistently suppressed No recent complaints of palpitations  Her I-131 uptake in 2023 was 20 % with patchy uptake    Lab Results  Component Value Date   TSH <0.01 (L) 01/31/2023   TSH <0.01 (L) 08/09/2022   FREET4 1.27 01/31/2023   FREET4 0.95 08/09/2022   Lab Results  Component Value Date   T3FREE 3.2 01/31/2023   T3FREE 2.8 08/09/2022   T3FREE 3.5 05/09/2022       Foot exam done regularly by podiatrist She has some numbness in her toes which is mild and chronic  She uses Aspercreme given by podiatrist for discomfort  She has been using diabetic shoes    Examination:   BP 121/76 (BP Location: Right Arm, Patient Position:  Sitting)   Pulse 83   Ht 5\' 7"  (1.702 m)   Wt 207 lb (93.9 kg)   SpO2 97%   BMI 32.42 kg/m   Body mass index is 32.42 kg/m.   She has a large goiter especially on the right side but right lobe is relatively softer and comparatively smaller compared to her last exam No  tremor No edema   ASSESSMENT/ PLAN:  Diabetes Type 2 with obesity on insulin, Mounjaro 5 mg weekly and metformin  See history of present illness for detailed discussion of  current management, blood sugar patterns and problems identified  Her A1c is excellent at 6.9  She is doing well with Mounjaro and although her average blood sugar recently is 162 and she is over 70% in target range appears to be needing a little more basal insulin She arbitrarily reduce her Tresiba by 10 units She does not like to take Humalog to cover mealtime spikes but these are only mild and relatively infrequent  Also losing weight and starting to walk  Recommendations:  Continue Mounjaro 5 mg She will go up to at least 22 units of Tresiba and if morning sugars are consistently over 150 go up to 24 units She can use MiraLAX to counteract the constipation Discussed blood sugar targets at various times Reassured her that [place] will not drop her sugars during the night and she does not need to have snacks  MULTINODULAR goiter: She has had a very good response to radiofrequency ablation of the dominant right lobe nodule and she will follow-up with the radiologist again  Thyroid function to be rechecked today  HYPERTENSION: Blood pressure is consistently controlled and she will continue same regimen  Lipid management: Discussed that she needs to be on low-dose statins reduce cardiovascular risk and she agrees to take simvastatin 10 mg twice a week and if tolerated go up to every other day at least    Patient Instructions  Tresiba 22 units daily  Miralax daily     Reather Littler 05/03/2023, 10:14 AM   Total visit time for evaluation and management of multiple problems and counseling = 40 minutes  Addendum: Thyroid levels show slightly higher free T4, will check T3 also Also she needs to take her HCTZ every other day since creatinine is going up

## 2023-05-03 NOTE — Patient Instructions (Addendum)
Tresiba 22 units daily  Miralax daily

## 2023-05-09 ENCOUNTER — Encounter: Payer: Self-pay | Admitting: Podiatry

## 2023-05-09 ENCOUNTER — Ambulatory Visit (INDEPENDENT_AMBULATORY_CARE_PROVIDER_SITE_OTHER): Payer: 59 | Admitting: Podiatry

## 2023-05-09 VITALS — BP 148/79 | HR 91

## 2023-05-09 DIAGNOSIS — I739 Peripheral vascular disease, unspecified: Secondary | ICD-10-CM | POA: Diagnosis not present

## 2023-05-09 DIAGNOSIS — B351 Tinea unguium: Secondary | ICD-10-CM

## 2023-05-09 DIAGNOSIS — M79674 Pain in right toe(s): Secondary | ICD-10-CM

## 2023-05-09 DIAGNOSIS — L84 Corns and callosities: Secondary | ICD-10-CM | POA: Diagnosis not present

## 2023-05-09 DIAGNOSIS — M79675 Pain in left toe(s): Secondary | ICD-10-CM

## 2023-05-09 DIAGNOSIS — E1142 Type 2 diabetes mellitus with diabetic polyneuropathy: Secondary | ICD-10-CM

## 2023-05-09 MED ORDER — GENTAMICIN SULFATE 0.1 % EX CREA
TOPICAL_CREAM | CUTANEOUS | 0 refills | Status: AC
Start: 2023-05-09 — End: ?

## 2023-05-09 NOTE — Progress Notes (Signed)
Subjective:  Patient ID: Brittney [name], female    DOB: December 13, 1945,  MRN: [id number]  Brittney [name] presents to clinic today for at risk footcare. Patient has h/o diabetes, neuropathy and PAD and is seen for  and callus(es) left foot and painful thick toenails that are difficult to trim. Painful toenails interfere with ambulation. Aggravating factors include wearing enclosed shoe gear. Pain is relieved with periodic professional debridement. Painful calluses are aggravated when weightbearing with and without shoegear. Pain is relieved with periodic professional debridement. Patient states left hallux healed fine and her left 5th toe feels better. She is requesting refill of Gentamicin Cream to have on hand for any injuries. She is followed closely by Vascular Team. Chief Complaint  Patient presents with   Nail Problem    Diabetic foot care   Diabetes    DFC BS - DIDN'T CHECK IT  A1C - 6.9 LVPCP - 05/03/23 HASN'T TAKEN BP MEDICINE THIS MORNING   New problem(s): None.   PCP is Brittney Mires, MD.  Allergies  Allergen Reactions   Latex     Itching and breaking out   Pravastatin     Pt stated, "Made my muscle ache"    Review of Systems: Negative except as noted in the HPI.  Objective:  Vitals:   05/09/23 1009  BP: (!) 148/79  Pulse: 91   Brittney [name] is a pleasant 77 y.o. female obese in NAD. AAO x 3. She has not taken her blood pressure medication this morning.  Vascular Examination: CFT <4 seconds b/l LE. Faintly palpable DP pulses b/l LE. Faintly palpable PT pulse(s) b/l LE. Pedal hair absent. No pain with calf compression b/l. Lower extremity skin temperature gradient within normal limits. No ischemia or gangrene noted b/l LE. No cyanosis or clubbing noted b/l LE.  Dermatological Examination: Pedal skin is warm and supple b/l LE. No open wounds b/l LE. Toenails 1-5 bilaterally and elongated, discolored, dystrophic, thickened, and crumbly with subungual debris and  tenderness to dorsal palpation.   Hyperkeratotic tissue plantarlateral midfoot LLE. No erythema, no edema, no drainage, no fluctuance, no warmth.  Neurological Examination: Protective sensation intact 5/5 intact bilaterally with 10g monofilament b/l. Vibratory sensation intact b/l. Proprioception intact bilaterally.  Musculoskeletal Examination: Muscle strength 5/5 to all LE muscle groups of RLE. Muscle strength 4/5 to all LE muscle groups of left lower extremity. Charcot deformity noted plantarlateral midfoot LLE.  Assessment/Plan: 1. Pain due to onychomycosis of toenails of both feet   2. Callus   3. PAD (peripheral artery disease) (HCC)   4. Diabetic polyneuropathy associated with type 2 diabetes mellitus (HCC)     Meds ordered this encounter  Medications   gentamicin cream (GARAMYCIN) 0.1 %    Sig: Apply to affected area once daily.    Dispense:  30 g    Refill:  0    -Patient was evaluated and treated. All patient's and/or POA's questions/concerns answered on today's visit. -Discussed elevated blood pressure reading with patient. Patient advised to take blood pressure medication when they get home. Patient to repeat blood pressure at home and contact PCP/Cardiologist if it remains elevated. Patient/Family/Caregiver/POA related understanding. -Refilled Gentamicin Cream to use prn for any abrasions/injuries due to diabetes/neuropathy. -Continue foot and shoe inspections daily. Monitor blood glucose per PCP/Endocrinologist's recommendations. -Patient to continue soft, supportive shoe gear daily. -Toenails 1-5 b/l were debrided in length and girth with sterile nail nippers and dremel without iatrogenic bleeding.  -Callus(es) plantarlateral aspect of midfoot left foot  pared utilizing sharp debridement with sterile blade without complication or incident. Total number debrided =1. -Patient/POA to call should there be question/concern in the interim.   Return in about 3 months (around  08/09/2023).  Freddie Breech, DPM

## 2023-05-10 ENCOUNTER — Encounter: Payer: Self-pay | Admitting: Endocrinology

## 2023-05-16 ENCOUNTER — Telehealth: Payer: Self-pay

## 2023-05-16 DIAGNOSIS — I739 Peripheral vascular disease, unspecified: Secondary | ICD-10-CM

## 2023-05-16 NOTE — Telephone Encounter (Signed)
Caller: Patient  Concern: rest pain  Pt denies swelling, redness, or wounds  Location: left leg  Description:  x 2 wks  Aggravating Factors: lying down  Quality:  wakes pt up at HS, not sleeping well  Resolution: Appointment scheduled for first available on Dr. Randie Heinz clinic day, moved earlier  Next Appt: Appointment scheduled for 05/24/23 @ 1200 for [place] & [place]

## 2023-05-16 NOTE — Telephone Encounter (Signed)
This encounter was created in error - please disregard.

## 2023-05-24 ENCOUNTER — Ambulatory Visit (HOSPITAL_COMMUNITY)
Admission: RE | Admit: 2023-05-24 | Discharge: 2023-05-24 | Disposition: A | Discharge status: Home / Self Care | Payer: 59 | Source: Ambulatory Visit | Attending: Vascular Surgery | Admitting: Vascular Surgery

## 2023-05-24 ENCOUNTER — Ambulatory Visit (INDEPENDENT_AMBULATORY_CARE_PROVIDER_SITE_OTHER): Payer: 59 | Admitting: Physician Assistant

## 2023-05-24 VITALS — BP 176/80 | HR 95 | Temp 98.4°F | Resp 18 | Ht 67.0 in | Wt 206.3 lb

## 2023-05-24 DIAGNOSIS — M25552 Pain in left hip: Secondary | ICD-10-CM

## 2023-05-24 DIAGNOSIS — I739 Peripheral vascular disease, unspecified: Secondary | ICD-10-CM

## 2023-05-24 LAB — VAS US ABI WITH/WO TBI
Left ABI: 0.76
Right ABI: 0.92

## 2023-05-24 NOTE — Progress Notes (Addendum)
Office Note     CC:  follow up Requesting Provider:  Mirna Mires, MD  HPI: Brittney [name] is a 77 y.o. (Apr 06, 1946) female who presents for surveillance of PAD.  She has known claudication in her left calf when walking long distances.  She has an ABI of 0.7 with a toe pressure of 60 mmHg from last visit.  She reports increasing pain in her left hip/buttock which radiates down the back of her left thigh.  This occurs at night when laying flat or when leaning over to wash her leg.  This pain is keeping her up at night and has been present for the past several weeks to months.  The calf cramping when walking does not affect her day-to-day life.  She has known left knee osteoarthritis for which she receives steroid injections.  She is an insulin-dependent diabetic with most recent hemoglobin A1c of 6.9.  She quit smoking over 40 years ago.  She takes an aspirin daily.  She does not have any wounds of bilateral lower extremities.  She also does not complain of pain in her feet overnight.   Past Medical History:  Diagnosis Date   Atrial fibrillation (HCC)    Paroxysmal; LVH; nl EF; onset in 1999   Burning with urination 02/23/2016   Diabetes mellitus    insulin; managed by Dr. Lucianne Muss   Diabetic Charcot's joint disease [facility])    left lower extremity   Hyperlipidemia    Hypertension    Hypothyroidism    history of goiter; nl TSH off medication   Itching with irritation 02/27/2015   Obesity 07/06/2012   Palpitations    negative event recorder in 2009   UTI (urinary tract infection) 02/23/2016    Past Surgical History:  Procedure Laterality Date   BIOPSY THYROID     CESAREAN SECTION     CHOLECYSTECTOMY  11/95   COLONOSCOPY  2007   IR RADIOLOGIST EVAL & MGMT  02/10/2023   RETINAL DETACHMENT SURGERY  2009   TUBAL LIGATION  1980's    Social History   Socioeconomic History   Marital status: Legally Separated    Spouse name: Not on file   Number of children: Not on file   Years of education:  Not on file   Highest education level: Not on file  Occupational History   Occupation: Retired  Tobacco Use   Smoking status: Former    Packs/day: 1    Types: Cigarettes    Start date: 11/21/1966    Quit date: 08/13/1986    Years since quitting: 36.8   Smokeless tobacco: Never  Vaping Use   Vaping Use: Never used  Substance and Sexual Activity   Alcohol use: No    Alcohol/week: 0.0 standard drinks of alcohol   Drug use: No   Sexual activity: Not Currently    Birth control/protection: Post-menopausal, Surgical    Comment: tubal  Other Topics Concern   Not on file  Social History Narrative   No regular exercise   Social Determinants of Health   Financial Resource Strain: Not on file  Food Insecurity: Not on file  Transportation Needs: Not on file  Physical Activity: Not on file  Stress: Not on file  Social Connections: Not on file  Intimate Partner Violence: Not on file   Family History  Problem Relation Age of Onset   Diabetes Mother    Diabetes Daughter        gestational diabetes   Cancer Father  Thyroid disease Brother    Other Son        MVA    Current Outpatient Medications  Medication Sig Dispense Refill   aspirin 81 MG chewable tablet Chew 81 mg by mouth daily.     Blood Glucose Monitoring Suppl (ACCU-CHEK GUIDE ME) w/Device KIT Use to check blood sugar 2 times per day dx code E11.65 1 kit 0   cloNIDine (CATAPRES) 0.1 MG tablet TAKE (1) TABLET BY MOUTH TWICE DAILY. 180 tablet 1   Continuous Blood Gluc Sensor (DEXCOM G7 SENSOR) MISC Change every 10 days 9 each 4   gentamicin cream (GARAMYCIN) 0.1 % Apply to affected area once daily. 30 g 0   glucose blood (ACCU-CHEK GUIDE) test strip USE TO TEST BLOOD SUGAR THREE TIMES DAILY AS DIRECTED. 100 strip 3   HUMALOG KWIKPEN 100 UNIT/ML KwikPen INJECT 6 TO 7 UNITS SUBCUTANEOUSLY BEFORE SUPPER IF GLUCOSE IS HIGH. 15 mL 0   hydrochlorothiazide (MICROZIDE) 12.5 MG capsule TAKE ONE CAPSULE BY MOUTH ONCE DAILY. 90  capsule 0   insulin degludec (TRESIBA FLEXTOUCH) 100 UNIT/ML FlexTouch Pen INJECT 30 UNITS SUBCUTANEOUSLY ONCE EVERY MORNING. 15 mL 2   Insulin Pen Needle (B-D ULTRAFINE III SHORT PEN) 31G X 8 MM MISC USE  2-3 TIMES A DAY. 100 each 4   Lancets (ONETOUCH ULTRASOFT) lancets      metoprolol tartrate (LOPRESSOR) 50 MG tablet TAKE 1/2 TABLET BY MOUTH TWICE DAILY. 90 tablet 0   Multiple Vitamin (MULTIVITAMIN) tablet Take 1 tablet by mouth once a week.     omeprazole (PRILOSEC) 20 MG capsule Take 20 mg by mouth daily.     simvastatin (ZOCOR) 10 MG tablet Take 1 tablet (10 mg total) by mouth at bedtime. 30 tablet 3   SURE COMFORT INSULIN SYRINGE 31G X 5/16" 0.3 ML MISC USE AS DIRECTED 2-3 TIMES A DAY. 100 each 0   tirzepatide (MOUNJARO) 5 MG/0.5ML Pen Inject 5 mg into the skin once a week. 2 mL 2   No current facility-administered medications for this visit.    Allergies  Allergen Reactions   Latex     Itching and breaking out   Pravastatin     Pt stated, "Made my muscle ache"     REVIEW OF SYSTEMS:   [X]  denotes positive finding, [ ]  denotes negative finding Cardiac  Comments:  Chest pain or chest pressure:    Shortness of breath upon exertion:    Short of breath when lying flat:    Irregular heart rhythm:        Vascular    Pain in calf, thigh, or hip brought on by ambulation:    Pain in feet at night that wakes you up from your sleep:     Blood clot in your veins:    Leg swelling:         Pulmonary    Oxygen at home:    Productive cough:     Wheezing:         Neurologic    Sudden weakness in arms or legs:     Sudden numbness in arms or legs:     Sudden onset of difficulty speaking or slurred speech:    Temporary loss of vision in one eye:     Problems with dizziness:         Gastrointestinal    Blood in stool:     Vomited blood:         Genitourinary    Burning when urinating:  Blood in urine:        Psychiatric    Major depression:         Hematologic     Bleeding problems:    Problems with blood clotting too easily:        Skin    Rashes or ulcers:        Constitutional    Fever or chills:      PHYSICAL EXAMINATION:  Vitals:   05/24/23 1225  BP: (!) 176/80  Pulse: 95  Resp: 18  Temp: 98.4 F (36.9 C)  TempSrc: Temporal  SpO2: 98%  Weight: 206 lb 4.8 oz (93.6 kg)  Height: 5\' 7"  (1.702 m)    General:  WDWN in NAD; vital signs documented above Gait: Not observed HENT: WNL, normocephalic Pulmonary: normal non-labored breathing , without Rales, rhonchi,  wheezing Cardiac: regular HR Abdomen: soft, NT, no masses Skin: without rashes Vascular Exam/Pulses: brisk L PT, DP, and peroneal signal Extremities: without ischemic changes, without Gangrene , without cellulitis; without open wounds;  Musculoskeletal: no muscle wasting or atrophy  Neurologic: A&O X 3 Psychiatric:  The pt has Normal affect.   Non-Invasive Vascular Imaging:    ABI/TBIToday's ABIToday's TBIPrevious ABIPrevious TBI  +-------+-----------+-----------+------------+------------+  Right 0.92       0.62       1.06        0.91          +-------+-----------+-----------+------------+------------+  Left  0.76       0.38       0.75        0.35          +-------+-----------+-----------+------------+------------+    ASSESSMENT/PLAN:: 77 y.o. female here for surveillance of PAD with ABI/TBI.  Increasing pain in her left hip radiating down her left leg  -Brittney [name] is a 77 year old female with several week history of pain in her left hip radiating down her left thigh.  This pain has been keeping her up at night and occurs when she is laying flat on her back or when bending over to wash her left leg.  She also has mild claudication symptoms of in her left calf when walking long distances however this does not affect her day-to-day life.  She denies any rest pain or tissue loss in her left foot.  Left ABI and TBI are stable compared to 1 year  ago.  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  We discussed left lower extremity arteriogram however this comes with risk and will likely only potentially explain the mild left calf claudication symptoms.  She will try some stretching as well as nonsteroidal anti-inflammatory medication to help with her left hip pain.  She will also reach out to her PCP for additional recommendations.  She will continue her aspirin.  We will repeat ABIs in 6 months.  She will call/return office if she develops rest pain or tissue loss in the left foot.   Emilie Rutter, PA-C [facility] [phone]  Clinic MD:   Randie Heinz

## 2023-05-26 ENCOUNTER — Other Ambulatory Visit: Payer: Self-pay | Admitting: Endocrinology

## 2023-05-31 ENCOUNTER — Telehealth: Payer: Self-pay | Admitting: Cardiology

## 2023-05-31 NOTE — Telephone Encounter (Signed)
Returned call to pt, no answer, left msg to call back.

## 2023-05-31 NOTE — Telephone Encounter (Signed)
Pt states that she had an episode of palpitations, and SOB on Sat she states that it lasted 30-40 minutes on and off. Pt states that she was mopping at that time and had to stop and sit down. She reports that since she has not felt well. She did not go to church on Sunday and does not feel like going to grocery store. Pt reports that she has not had any palpitations since. She states that she has not changed her diet in any way. Please advise.

## 2023-05-31 NOTE — Telephone Encounter (Signed)
Patient c/o Palpitations:  High priority if patient c/o lightheadedness, shortness of breath, or chest pain  How long have you had palpitations/irregular HR/ Afib? Are you having the symptoms now? Palpitations last Saturday   Are you currently experiencing lightheadedness, SOB or CP? SOB during the episode but not currently   Do you have a history of afib (atrial fibrillation) or irregular heart rhythm? No   Have you checked your BP or HR? (document readings if available): no   Are you experiencing any other symptoms?  Pt states she had palpitations this past Saturday and since then she hasn't been feeling well. Please advise.

## 2023-06-01 NOTE — Telephone Encounter (Signed)
From 05/2022 note with PA Asa Lente was to increase lopressor to 50mg  bid, her med list still has lopressor 25mg  bid written. Can we clarify what dose she is taking, if just 25mg  bid then would increase to 50mg  bid. She had a heart monitor about 2 years ago with just some occasional extra heart beats, would hold off on repeating as of now but if ongoing symptoms may need to reconsider  Dominga Ferry MD

## 2023-06-01 NOTE — Telephone Encounter (Signed)
Returned call to pt. No answer. Left msg to call back.  

## 2023-06-02 NOTE — Telephone Encounter (Signed)
Patient returned RN's call. 

## 2023-06-02 NOTE — Telephone Encounter (Signed)
Left message to return call 

## 2023-06-02 NOTE — Telephone Encounter (Signed)
I verified that with patient her bottle of is Lopressor 50 mg with sig: 1/2 tablet twice a day  Patient says she read it wrong and is only taking 1/2 tablet (25 mg) once a day  I advised her to take it twice a day and see if her palpitations improved.   She will update [place]

## 2023-06-09 ENCOUNTER — Other Ambulatory Visit: Payer: Self-pay

## 2023-06-09 DIAGNOSIS — I739 Peripheral vascular disease, unspecified: Secondary | ICD-10-CM

## 2023-07-03 ENCOUNTER — Other Ambulatory Visit: Payer: Self-pay

## 2023-07-03 ENCOUNTER — Telehealth: Payer: Self-pay | Admitting: Endocrinology

## 2023-07-03 DIAGNOSIS — Z794 Long term (current) use of insulin: Secondary | ICD-10-CM

## 2023-07-03 DIAGNOSIS — I10 Essential (primary) hypertension: Secondary | ICD-10-CM | POA: Diagnosis not present

## 2023-07-03 DIAGNOSIS — N1831 Chronic kidney disease, stage 3a: Secondary | ICD-10-CM | POA: Diagnosis not present

## 2023-07-03 DIAGNOSIS — E042 Nontoxic multinodular goiter: Secondary | ICD-10-CM | POA: Diagnosis not present

## 2023-07-03 DIAGNOSIS — E7800 Pure hypercholesterolemia, unspecified: Secondary | ICD-10-CM | POA: Diagnosis not present

## 2023-07-03 DIAGNOSIS — E1122 Type 2 diabetes mellitus with diabetic chronic kidney disease: Secondary | ICD-10-CM | POA: Diagnosis not present

## 2023-07-03 MED ORDER — TIRZEPATIDE 5 MG/0.5ML ~~LOC~~ SOAJ
5.0000 mg | SUBCUTANEOUS | 2 refills | Status: DC
Start: 2023-07-03 — End: 2023-07-03

## 2023-07-03 MED ORDER — TIRZEPATIDE 5 MG/0.5ML ~~LOC~~ SOAJ
5.0000 mg | SUBCUTANEOUS | 0 refills | Status: DC
Start: 2023-07-03 — End: 2023-07-06

## 2023-07-03 NOTE — Telephone Encounter (Signed)
Brittney [name] has been sent to [facility] and [name] is aware

## 2023-07-03 NOTE — Telephone Encounter (Signed)
MEDICATION: Mounjaro  PHARMACY:  [facility]  HAS THE PATIENT CONTACTED THEIR PHARMACY? YES  IS THIS A 90 DAY SUPPLY : BO  IS PATIENT OUT OF MEDICATION: YES  IF NOT; HOW MUCH IS LEFT: N/A  LAST APPOINTMENT DATE: @7 /03/2023  NEXT APPOINTMENT DATE:@8 /21/2024  DO WE HAVE YOUR PERMISSION TO LEAVE A DETAILED MESSAGE?: yes  OTHER COMMENTS:    **Let patient know to contact pharmacy at the end of the day to make sure medication is ready. **  ** Please notify patient to allow 48-72 hours to process**  **Encourage patient to contact the pharmacy for refills or they can request refills through [facility]**

## 2023-07-06 ENCOUNTER — Other Ambulatory Visit: Payer: Self-pay

## 2023-07-06 ENCOUNTER — Telehealth: Payer: Self-pay | Admitting: Endocrinology

## 2023-07-06 DIAGNOSIS — E119 Type 2 diabetes mellitus without complications: Secondary | ICD-10-CM

## 2023-07-06 MED ORDER — TIRZEPATIDE 5 MG/0.5ML ~~LOC~~ SOAJ
5.0000 mg | SUBCUTANEOUS | 0 refills | Status: DC
Start: 2023-07-06 — End: 2023-08-07

## 2023-07-06 MED ORDER — TIRZEPATIDE 5 MG/0.5ML ~~LOC~~ SOAJ
5.0000 mg | SUBCUTANEOUS | 0 refills | Status: DC
Start: 2023-07-06 — End: 2023-07-06

## 2023-07-06 NOTE — Telephone Encounter (Signed)
Greggory Keen has been sent to [facility]

## 2023-07-06 NOTE — Telephone Encounter (Signed)
MEDICATION:  tirzepatide Greggory Keen) 5 MG/0.5ML Pen [[id number]]  PHARMACY:    [facility] - [place], [place] - [address] (Ph: [phone])   HAS THE PATIENT CONTACTED THEIR PHARMACY?  Yes  IS THIS A 90 DAY SUPPLY : Yes  IS PATIENT OUT OF MEDICATION: Yes  IF NOT; HOW MUCH IS LEFT:   LAST APPOINTMENT DATE: @6 /10/2023  NEXT APPOINTMENT DATE:@8 /21/2024  DO WE HAVE YOUR PERMISSION TO LEAVE A DETAILED MESSAGE?:Yes  OTHER COMMENTS:    **Let patient know to contact pharmacy at the end of the day to make sure medication is ready. **  ** Please notify patient to allow 48-72 hours to process**  **Encourage patient to contact the pharmacy for refills or they can request refills through [facility]**

## 2023-07-12 ENCOUNTER — Ambulatory Visit: Payer: 59 | Admitting: Endocrinology

## 2023-07-12 ENCOUNTER — Other Ambulatory Visit: Payer: Self-pay | Admitting: Interventional Radiology

## 2023-07-12 ENCOUNTER — Other Ambulatory Visit: Payer: Self-pay | Admitting: *Deleted

## 2023-07-12 DIAGNOSIS — E041 Nontoxic single thyroid nodule: Secondary | ICD-10-CM

## 2023-07-26 ENCOUNTER — Encounter (HOSPITAL_COMMUNITY): Payer: 59

## 2023-07-26 ENCOUNTER — Ambulatory Visit: Payer: 59

## 2023-07-27 ENCOUNTER — Ambulatory Visit (HOSPITAL_COMMUNITY)
Admission: RE | Admit: 2023-07-27 | Discharge: 2023-07-27 | Disposition: A | Discharge status: Home / Self Care | Payer: 59 | Source: Ambulatory Visit | Attending: Interventional Radiology | Admitting: Interventional Radiology

## 2023-07-27 DIAGNOSIS — E041 Nontoxic single thyroid nodule: Secondary | ICD-10-CM | POA: Insufficient documentation

## 2023-07-27 DIAGNOSIS — E042 Nontoxic multinodular goiter: Secondary | ICD-10-CM | POA: Diagnosis not present

## 2023-07-31 NOTE — Progress Notes (Signed)
Referring Physician(s): Reather Littler  Reason for follow up: The patient is seen in virtual telephone visit today s/p thyroid ablation 04/06/23  History of present illness: HPI from initial consult 02/10/23 Brittney [name] is a 77 y.o. female with history of multinodular goiter.  She has remained euthyroid with laboratory subclinical hyperthyroidism currently.  She has a history of palpitations, likely attributed to her thyroid, which are under control with metoprolol 25 mg QD.  She reports her thyroid has been enlarged for 20 years.  In 2003, she had a negative FNA on a nodule in the left thyroid.  She denies any compressive symptoms such as dysphagia, voice changes, or dyspnea.  The right side of her thyroid bothers her more than the left. She has met with Dr. Gerrit Friends to consider thyroidectomy, however more minimally invasive options are being considered given her age and comorbidities.   Thyroid Symptom Score: 8/10   Thyroid Cosmetic Score:  Readily detected cosmetic problem (Grade 4).  She expressed interest in pursuing thyroid RFA. We discussed first focusing on the dominant right sided lesion, given her symptoms correlate predominately with the right side, then we could address other nodules in the future depending on her symptoms. Since she had not had a right nodule biopsy, we needed 2 negative biopsies prior to being able to proceed with RFA. These were performed 02/24/23 and 03/13/23. She then underwent a successful thermal ablation of the right superior thyroid nodule 04/06/23.   She presents today via virtual telephone visit for follow up and to discuss her recent Thyroid Ultrasound.   Past Medical History:  Diagnosis Date   Atrial fibrillation (HCC)    Paroxysmal; LVH; nl EF; onset in 1999   Burning with urination 02/23/2016   Diabetes mellitus    insulin; managed by Dr. 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   Hyperlipidemia    Hypertension     Hypothyroidism    history of goiter; nl TSH off medication   Itching with irritation 02/27/2015   Obesity 07/06/2012   Palpitations    negative event recorder in 2009   UTI (urinary tract infection) 02/23/2016    Past Surgical History:  Procedure Laterality Date   BIOPSY THYROID     CESAREAN SECTION     CHOLECYSTECTOMY  11/95   COLONOSCOPY  2007   IR RADIOLOGIST EVAL & MGMT  02/10/2023   RETINAL DETACHMENT SURGERY  2009   TUBAL LIGATION  1980's    Allergies: Latex and Pravastatin  Medications: Prior to Admission medications   Medication Sig Start Date End Date Taking? Authorizing Provider  aspirin 81 MG chewable tablet Chew 81 mg by mouth daily.    [provider]  Blood Glucose Monitoring Suppl (ACCU-CHEK GUIDE ME) w/Device KIT Use to check blood sugar 2 times per day dx code E11.65 02/24/21   Reather Littler, MD  cloNIDine (CATAPRES) 0.1 MG tablet TAKE (1) TABLET BY MOUTH TWICE DAILY. 10/18/22   Reather Littler, MD  Continuous Blood Gluc Sensor (DEXCOM G7 SENSOR) MISC Change every 10 days 12/29/22   Reather Littler, MD  gentamicin cream (GARAMYCIN) 0.1 % Apply to affected area once daily. 05/09/23   Freddie Breech, DPM  glucose blood (ACCU-CHEK GUIDE) test strip USE TO TEST BLOOD SUGAR THREE TIMES DAILY AS DIRECTED. 03/23/23   Reather Littler, MD  HUMALOG KWIKPEN 100 UNIT/ML KwikPen INJECT 6 TO 7 UNITS SUBCUTANEOUSLY BEFORE SUPPER IF GLUCOSE IS HIGH. 11/01/22   Lucianne Muss,  Ajay, MD  hydrochlorothiazide (MICROZIDE) 12.5 MG capsule TAKE ONE CAPSULE BY MOUTH ONCE DAILY. 04/27/23   Reather Littler, MD  insulin degludec (TRESIBA FLEXTOUCH) 100 UNIT/ML FlexTouch Pen INJECT 30 UNITS SUBCUTANEOUSLY ONCE EVERY MORNING. 02/10/23   Reather Littler, MD  Insulin Pen Needle (B-D ULTRAFINE III SHORT PEN) 31G X 8 MM MISC USE  2-3 TIMES A DAY. 07/15/21   Reather Littler, MD  Lancets [facility] ULTRASOFT) lancets  04/29/13   [provider]  metoprolol tartrate (LOPRESSOR) 50 MG tablet TAKE 1/2 TABLET BY MOUTH TWICE DAILY.  03/16/23   Antoine Poche, MD  Multiple Vitamin (MULTIVITAMIN) tablet Take 1 tablet by mouth once a week.    [provider]  omeprazole (PRILOSEC) 20 MG capsule Take 20 mg by mouth daily.    [provider]  simvastatin (ZOCOR) 10 MG tablet TAKE (1) TABLET BY MOUTH AT BEDTIME. 05/26/23   Reather Littler, MD  SURE COMFORT INSULIN SYRINGE 31G X 5/16" 0.3 ML MISC USE AS DIRECTED 2-3 TIMES A DAY. 01/27/20   Reather Littler, MD  tirzepatide [facility]) 5 MG/0.5ML Pen Inject 5 mg into the skin once a week. 07/06/23   Reather Littler, MD     Family History  Problem Relation Age of Onset   Diabetes Mother    Diabetes Daughter        gestational diabetes   Cancer Father    Thyroid disease Brother    Other Son        MVA    Social History   Socioeconomic History   Marital status: Legally Separated    Spouse name: Not on file   Number of children: Not on file   Years of education: Not on file   Highest education level: Not on file  Occupational History   Occupation: Retired  Tobacco Use   Smoking status: Former    Current packs/day: 0.00    Average packs/day: 1 pack/day for 19.7 years (19.7 ttl pk-yrs)    Types: Cigarettes    Start date: 11/21/1966    Quit date: 08/13/1986    Years since quitting: 36.9   Smokeless tobacco: Never  Vaping Use   Vaping status: Never Used  Substance and Sexual Activity   Alcohol use: No    Alcohol/week: 0.0 standard drinks of alcohol   Drug use: No   Sexual activity: Not Currently    Birth control/protection: Post-menopausal, Surgical    Comment: tubal  Other Topics Concern   Not on file  Social History Narrative   No regular exercise   Social Determinants of Health   Financial Resource Strain: Not on file  Food Insecurity: Not on file  Transportation Needs: Not on file  Physical Activity: Not on file  Stress: Not on file  Social Connections: Not on file    Vital Signs: There were no vitals taken for this visit.  No physical exam  was performed in lieu of virtual telephone visit.   Imaging: US THYROID  Result Date: 07/28/2023 CLINICAL DATA:  77 year old female with a history of thyroid nodules, with prior ablation performed of right superior thyroid nodule 04/06/2023. EXAM: THYROID ULTRASOUND TECHNIQUE: Ultrasound examination of the thyroid gland and adjacent soft tissues was performed. COMPARISON:  01/17/2023, 12/09/2012 Prior biopsy of right superior thyroid nodule on 02/24/2023 and 03/13/2023 FINDINGS: Parenchymal Echotexture: Markedly heterogenous Isthmus: 0.9 cm Right lobe: 7.4 cm x 4.2 cm x 5.9 cm Left lobe: 5.8 cm x 3.2 cm x 2.5 cm _________________________________________________________ Estimated total number of nodules >/=  1 cm: 6-10 Number of spongiform nodules >/=  2 cm not described below (TR1): 0 Number of mixed cystic and solid nodules >/= 1.5 cm not described below (TR2): 0 _________________________________________________________ Nodule labeled 1 in the superior right thyroid, decreased in size, currently 4.1 cm maximum versus 5.7 cm previously. Nodule labeled 2 in the mid right thyroid, 3.5 cm. Again this nodule has been stable for greater than 5 years and does not meet criteria for further surveillance. Nodule labeled 3, mid right thyroid, 3.6 cm. Again this nodule has been stable for greater than 5 years and does not meet criteria for further surveillance. Nodule labeled 4 inferior right thyroid, 2.9 cm. This nodule has been stable for greater than 5 years and does not meet criteria for further surveillance. Nodule labeled 5, superior left thyroid, 1.6 cm. Again this appears relatively unchanged with poorly defined borders and is favored to represent a pseudo nodule. Nodule labeled 6, superior left thyroid, 2.2 cm. Again this is stable for longer than 5 years and does not meet criteria for further surveillance. Nodule labeled 7, mid left thyroid, 1.3 cm. This has spongiform characteristics and does not meet criteria  for further surveillance. Nodule labeled 8 inferior left thyroid, 3.4 cm. Again this is stable for longer than 5 years and does not meet criteria for further surveillance. No adenopathy IMPRESSION: Decreased size of right superior thyroid nodule status post ablation, now 4.1 cm. Remainder of the thyroid nodules have been unchanged for greater than 5 years, as above. Recommendations follow those established by the new ACR TI-RADS criteria (J Am Coll Radiol 2017;14:587-595). Electronically Signed   By: Gilmer Mor D.O.   On: 07/28/2023 15:48    NM Thyroid Uptake (I-123) (11/18/22) IMPRESSION: Multinodular thyroid gland with cold nodules at inferior RIGHT lobe and probably superior LEFT lobe; ultrasound evaluation recommended to exclude malignancy. Normal 4 hour and 24 hour radio iodine uptakes.    US Thyroid (01/17/23)  R superior nodule 5.5 x 3.6 x 5.7 cm = 59 cc    Labs:  CBC: No results for input(s): "WBC", "HGB", "HCT", "PLT" in the last 8760 hours.  COAGS: No results for input(s): "INR", "APTT" in the last 8760 hours.  BMP: Recent Labs    08/09/22 1008 11/02/22 1127 01/31/23 1002 05/03/23 1023  NA 138 138 138 138  K 4.4 4.7 4.7 5.1  CL 104 105 103 103  CO2 28 27 28 28   GLUCOSE 158* 172* 151* 181*  BUN 13 23 20 21   CALCIUM 9.2 9.9 10.2 9.4  CREATININE 0.91 0.96 0.96 1.28*    LIVER FUNCTION TESTS: Recent Labs    08/09/22 1008  BILITOT 0.3  AST 12  ALT 8  ALKPHOS 101  PROT 7.0  ALBUMIN 3.7    Assessment and Plan:  Assessment and Plan: 77 year old female with history of multinodular goiter and subclinical hyperthyroidism, possibly from autonomous production. She is now s/p thermal ablation of the right superior thyroid nodule.   Electronically Signed: Mickie Kay 07/31/2023, 10:32 AM   I spent a total of 40 Minutes in virtual clinical consultation, greater than 50% of which was counseling/coordinating care for multinodular goiter.

## 2023-08-02 ENCOUNTER — Ambulatory Visit
Admission: RE | Admit: 2023-08-02 | Discharge: 2023-08-02 | Disposition: A | Discharge status: Home / Self Care | Payer: 59 | Source: Ambulatory Visit | Attending: Interventional Radiology

## 2023-08-02 DIAGNOSIS — E042 Nontoxic multinodular goiter: Secondary | ICD-10-CM | POA: Diagnosis not present

## 2023-08-02 DIAGNOSIS — E041 Nontoxic single thyroid nodule: Secondary | ICD-10-CM

## 2023-08-02 HISTORY — PX: IR RADIOLOGIST EVAL & MGMT: IMG5224

## 2023-08-07 ENCOUNTER — Telehealth: Payer: Self-pay | Admitting: Endocrinology

## 2023-08-07 DIAGNOSIS — I10 Essential (primary) hypertension: Secondary | ICD-10-CM

## 2023-08-07 DIAGNOSIS — Z794 Long term (current) use of insulin: Secondary | ICD-10-CM

## 2023-08-07 MED ORDER — TIRZEPATIDE 5 MG/0.5ML ~~LOC~~ SOAJ
5.0000 mg | SUBCUTANEOUS | 0 refills | Status: DC
Start: 2023-08-07 — End: 2023-11-09

## 2023-08-07 MED ORDER — ACCU-CHEK GUIDE VI STRP: ORAL_STRIP | 11 refills | Status: AC

## 2023-08-07 MED ORDER — HYDROCHLOROTHIAZIDE 12.5 MG PO CAPS: 12.5000 mg | ORAL_CAPSULE | Freq: Every day | ORAL | 0 refills | Status: DC

## 2023-08-07 MED ORDER — SIMVASTATIN 10 MG PO TABS: ORAL_TABLET | ORAL | 0 refills | Status: DC

## 2023-08-07 NOTE — Telephone Encounter (Signed)
MEDICATION: Mounjaro, Simvastatin, Hydrochlorothiazide, Accu-Chek Guide test strips. Brittney [name]  PHARMACY:  [facility]  HAS THE PATIENT CONTACTED THEIR PHARMACY?  yes  IS THIS A 90 DAY SUPPLY :  Unknown  IS PATIENT OUT OF MEDICATION: yes  IF NOT; HOW MUCH IS LEFT:   LAST APPOINTMENT DATE: @8 /15/2024  NEXT APPOINTMENT DATE:@10 /12/2022  DO WE HAVE YOUR PERMISSION TO LEAVE A DETAILED MESSAGE?: yes  OTHER COMMENTS:    **Let patient know to contact pharmacy at the end of the day to make sure medication is ready. **  ** Please notify patient to allow 48-72 hours to process**  **Encourage patient to contact the pharmacy for refills or they can request refills through [facility]**

## 2023-08-07 NOTE — Telephone Encounter (Signed)
DONE

## 2023-08-08 ENCOUNTER — Ambulatory Visit: Payer: 59 | Attending: Cardiology | Admitting: Cardiology

## 2023-08-08 ENCOUNTER — Encounter: Payer: Self-pay | Admitting: Cardiology

## 2023-08-08 VITALS — BP 116/62 | HR 82 | Ht 68.0 in | Wt 201.8 lb

## 2023-08-08 DIAGNOSIS — E782 Mixed hyperlipidemia: Secondary | ICD-10-CM | POA: Diagnosis not present

## 2023-08-08 DIAGNOSIS — I10 Essential (primary) hypertension: Secondary | ICD-10-CM | POA: Diagnosis not present

## 2023-08-08 DIAGNOSIS — R002 Palpitations: Secondary | ICD-10-CM | POA: Diagnosis not present

## 2023-08-08 NOTE — Patient Instructions (Signed)
Medication Instructions:  Continue all current medications.  Labwork: none  Testing/Procedures: none  Follow-Up: 6 months   Any Other Special Instructions Will Be Listed Below (If Applicable).  If you need a refill on your cardiac medications before your next appointment, please call your pharmacy.  

## 2023-08-08 NOTE — Progress Notes (Signed)
Clinical Summary Brittney [name] is a 77 y.o.female seen today for follow up of the following medical problems.    1. Remote history of afib - from prior notes remote history, has not been committed to anticoag - infrequent palpitations, 2-3 times a month. Lasts just a few seconds.    ZIO monitor 04/27/2021 without any atrial fibrillation. Rare episodes of SVT and NSVT  - denies significant palpitations.    2.HTN -compliant with meds   3. Hyperlipidemia 12/2019 TC 191 TG 148 HDL 62 LDL 99 - muscle cramps on prior statins, history somewhat unclear - from my last visit was trying pravastatin 20mg  few days a week but had muscle cramps.  - endo changed her simvastatin 10mg , she is not taking  - convoluted history because she has muscle cramps with or without statin - 07/2022 TC 152 TG 142 HDL 58 LDL 65  4.DM2 - followed by endocrine  Past Medical History:  Diagnosis Date   Atrial fibrillation (HCC)    Paroxysmal; LVH; nl EF; onset in 1999   Burning with urination 02/23/2016   Diabetes mellitus    insulin; managed by Dr. 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     Blood Glucose Monitoring Suppl (ACCU-CHEK GUIDE ME) w/Device KIT Use to check blood sugar 2 times per day dx code E11.65 1 kit 0   cloNIDine (CATAPRES) 0.1 MG tablet TAKE (1) TABLET BY MOUTH TWICE DAILY. 180 tablet 1   Continuous Blood Gluc Sensor (DEXCOM G7 SENSOR) MISC Change every 10 days 9  each 4   gentamicin cream (GARAMYCIN) 0.1 % Apply to affected area once daily. 30 g 0   glucose blood (ACCU-CHEK GUIDE) test strip Use as instructed 100 strip 11   HUMALOG KWIKPEN 100 UNIT/ML KwikPen INJECT 6 TO 7 UNITS SUBCUTANEOUSLY BEFORE SUPPER IF GLUCOSE IS HIGH. 15 mL 0   hydrochlorothiazide (MICROZIDE) 12.5 MG capsule Take 1 capsule (12.5 mg total) by mouth daily. 90 capsule 0   insulin degludec (TRESIBA FLEXTOUCH) 100 UNIT/ML FlexTouch Pen INJECT 30 UNITS SUBCUTANEOUSLY ONCE EVERY MORNING. 15 mL 2   Insulin Pen Needle (B-D ULTRAFINE III SHORT PEN) 31G X 8 MM MISC USE  2-3 TIMES A DAY. 100 each 4   Lancets (ONETOUCH ULTRASOFT) lancets      metoprolol tartrate (LOPRESSOR) 50 MG tablet TAKE 1/2 TABLET BY MOUTH TWICE DAILY. 90 tablet 0   Multiple Vitamin (MULTIVITAMIN) tablet Take 1 tablet by mouth once a week.     omeprazole (PRILOSEC) 20 MG capsule Take 20 mg by mouth daily.     simvastatin (ZOCOR) 10 MG tablet TAKE 1 TABLET 2 TIMES A WEEK 100 tablet 0   SURE COMFORT INSULIN SYRINGE 31G X 5/16" 0.3 ML MISC USE AS DIRECTED 2-3 TIMES A DAY. 100 each 0   tirzepatide [facility]) 5  MG/0.5ML Pen Inject 5 mg into the skin once a week. 6 mL 0   No current facility-administered medications for this visit.     Past Surgical History:  Procedure Laterality Date   BIOPSY THYROID     CESAREAN SECTION     CHOLECYSTECTOMY  11/95   COLONOSCOPY  2007   IR RADIOLOGIST EVAL & MGMT  02/10/2023   IR RADIOLOGIST EVAL & MGMT  08/02/2023   RETINAL DETACHMENT SURGERY  2009   TUBAL LIGATION  1980's     Allergies  Allergen Reactions   Latex     Itching and breaking out   Pravastatin     Pt stated, "Made my muscle ache"      Family History  Problem Relation Age of Onset   Diabetes Mother    Diabetes Daughter        gestational diabetes   Cancer Father    Thyroid disease Brother    Other Son        MVA     Social History Ms. Mctigue reports that she quit smoking about 37 years ago. Her  smoking use included cigarettes. She started smoking about 56 years ago. She has a 19.7 pack-year smoking history. She has never used smokeless tobacco. Ms. Ogas reports no history of alcohol use.   Review of Systems CONSTITUTIONAL: No weight loss, fever, chills, weakness or fatigue.  HEENT: Eyes: No visual loss, blurred vision, double vision or yellow sclerae.No hearing loss, sneezing, congestion, runny nose or sore throat.  SKIN: No rash or itching.  CARDIOVASCULAR: per hpi RESPIRATORY: No shortness of breath, cough or sputum.  GASTROINTESTINAL: No anorexia, nausea, vomiting or diarrhea. No abdominal pain or blood.  GENITOURINARY: No burning on urination, no polyuria NEUROLOGICAL: No headache, dizziness, syncope, paralysis, ataxia, numbness or tingling in the extremities. No change in bowel or bladder control.  MUSCULOSKELETAL: per hpi LYMPHATICS: No enlarged nodes. No history of splenectomy.  PSYCHIATRIC: No history of depression or anxiety.  ENDOCRINOLOGIC: No reports of sweating, cold or heat intolerance. No polyuria or polydipsia.  Marland Kitchen   Physical Examination Today's Vitals   08/08/23 0947  BP: 116/62  Pulse: 82  SpO2: 98%  Weight: 201 lb 12.8 oz (91.5 kg)  Height: 5\' 8"  (1.727 m)   Body mass index is 30.68 kg/m.  Gen: resting comfortably, no acute distress HEENT: no scleral icterus, pupils equal round and reactive, no palptable cervical adenopathy,  CV: RRR, no mrg, no jvd Resp: Clear to auscultation bilaterally GI: abdomen is soft, non-tender, non-distended, normal bowel sounds, no hepatosplenomegaly MSK: extremities are warm, no edema.  Skin: warm, no rash Neuro:  no focal deficits Psych: appropriate affect   Diagnostic Studies     Assessment and Plan   1. Remote history of afib - very remote history without clear recurrence, has not been committed to anticoag - prior monitor without afib - EKG today NSR - continue to monitor. She is on lopressor for  palpitations, prior monitor did show some SVT and NSVT   2. HTN - at goal, continue current meds   3. Hyperlipidemia - reports muscle cramps on statins, convoluted history because she has cramps with or without statins and statins were tried by prior providers. History remains unclear - currently on simva 10mg  few days a week by endocrine, see how she responds.   - LDL in 07/2022 was 65, statin indication would be mainly due to her DM history.      Antoine Poche, M.D.

## 2023-08-14 ENCOUNTER — Ambulatory Visit (INDEPENDENT_AMBULATORY_CARE_PROVIDER_SITE_OTHER): Payer: 59 | Admitting: Podiatry

## 2023-08-14 DIAGNOSIS — M79675 Pain in left toe(s): Secondary | ICD-10-CM | POA: Diagnosis not present

## 2023-08-14 DIAGNOSIS — B351 Tinea unguium: Secondary | ICD-10-CM

## 2023-08-14 DIAGNOSIS — M79674 Pain in right toe(s): Secondary | ICD-10-CM | POA: Diagnosis not present

## 2023-08-14 NOTE — Progress Notes (Unsigned)
Subjective:  Patient ID: Brittney [name], female    DOB: [birth year],  MRN: [id number]   Brittney [name] presents to clinic today for:  Chief Complaint  Patient presents with   Nail Problem    Nail trim    Patient notes nails are thick, discolored, elongated and painful in shoegear when trying to ambulate.  She has been pain in her arthritic knees.  PCP is Brittney Mires, MD.  Past Medical History:  Diagnosis Date   Atrial fibrillation (HCC)    Paroxysmal; LVH; nl EF; onset in 1999   Burning with urination 02/23/2016   Diabetes mellitus    insulin; managed by Dr. Lucianne [name]   Diabetic Charcot's joint disease Brittney [name])    left lower extremity   Hyperlipidemia    Hypertension    Hypothyroidism    history of goiter; nl TSH off medication   Itching with irritation 02/27/2015   Obesity 07/06/2012   Palpitations    negative event recorder in 2009   UTI (urinary tract infection) 02/23/2016    Past Surgical History:  Procedure Laterality Date   BIOPSY THYROID     CESAREAN SECTION     CHOLECYSTECTOMY  11/95   COLONOSCOPY  2007   IR RADIOLOGIST EVAL & MGMT  02/10/2023   IR RADIOLOGIST EVAL & MGMT  08/02/2023   RETINAL DETACHMENT SURGERY  2009   TUBAL LIGATION  1980's    Allergies  Allergen Reactions   Latex     Itching and breaking out   Pravastatin     Pt stated, "Made my muscle ache"   Review of Systems: Negative except as noted in the HPI.  Objective:  Brittney [name] is a pleasant 77 y.o. female in NAD. AAO x 3.  Vascular Examination: Capillary refill time is 3-5 seconds to toes bilateral. Palpable pedal pulses b/l LE. Digital hair present b/l.  Skin temperature gradient WNL b/l. No varicosities b/l. No cyanosis noted b/l.   Dermatological Examination: Pedal skin with normal turgor, texture and tone b/l. No open wounds. No interdigital macerations b/l. Toenails x10 are 3mm thick, discolored, dystrophic with subungual debris. There is pain with compression of the nail plates.   They are elongated x10     Latest Ref Rng & Units 05/03/2023   10:12 AM 01/31/2023    9:23 AM 11/02/2022   12:14 PM  Hemoglobin A1C  Hemoglobin-A1c 4.0 - 5.6 % 6.9  9.0  6.9    Assessment/Plan: 1. Pain due to onychomycosis of toenails of both feet    The mycotic toenails were sharply debrided x10 with sterile nail nippers and a power debriding burr to decrease bulk/thickness and length.    Patient requested a handicap parking placard.  Informed patient she will need to speak to the front desk upon checkout for this.  Return in about 3 months (around 11/13/2023) for [name].   Brittney [name], DPM, FACFAS Brittney [name]     [address] [place], [place] [address]                Office [phone]  Fax [phone]

## 2023-08-16 ENCOUNTER — Other Ambulatory Visit: Payer: Self-pay

## 2023-08-16 ENCOUNTER — Telehealth: Payer: Self-pay | Admitting: Endocrinology

## 2023-08-16 DIAGNOSIS — E119 Type 2 diabetes mellitus without complications: Secondary | ICD-10-CM

## 2023-08-16 MED ORDER — TRESIBA FLEXTOUCH 100 UNIT/ML ~~LOC~~ SOPN: 20.0000 [IU] | PEN_INJECTOR | Freq: Every day | SUBCUTANEOUS | 5 refills | Status: DC

## 2023-08-16 NOTE — Telephone Encounter (Signed)
Patient is out or her  Brittney [name] please call [employer] [facility] in [place]-[phone]

## 2023-08-16 NOTE — Telephone Encounter (Signed)
Medication refill request sent for Tresiba Requested Prescriptions   Signed Prescriptions Disp Refills   insulin degludec (TRESIBA FLEXTOUCH) 100 UNIT/ML FlexTouch Pen 3 mL 5    Sig: Inject 20 Units into the skin daily.    Authorizing Provider: THAPA, [place]    Ordering User: Tera Partridge

## 2023-08-23 ENCOUNTER — Encounter: Payer: Self-pay | Admitting: Endocrinology

## 2023-08-23 ENCOUNTER — Ambulatory Visit: Payer: 59 | Admitting: Endocrinology

## 2023-08-23 VITALS — BP 130/70 | HR 97 | Ht 68.0 in | Wt 199.4 lb

## 2023-08-23 DIAGNOSIS — E042 Nontoxic multinodular goiter: Secondary | ICD-10-CM | POA: Diagnosis not present

## 2023-08-23 DIAGNOSIS — Z23 Encounter for immunization: Secondary | ICD-10-CM | POA: Diagnosis not present

## 2023-08-23 DIAGNOSIS — E0590 Thyrotoxicosis, unspecified without thyrotoxic crisis or storm: Secondary | ICD-10-CM

## 2023-08-23 DIAGNOSIS — Z794 Long term (current) use of insulin: Secondary | ICD-10-CM | POA: Diagnosis not present

## 2023-08-23 DIAGNOSIS — E1165 Type 2 diabetes mellitus with hyperglycemia: Secondary | ICD-10-CM | POA: Diagnosis not present

## 2023-08-23 LAB — TSH: TSH: 0 u[IU]/mL — ABNORMAL LOW (ref 0.35–5.50)

## 2023-08-23 LAB — T3, FREE: T3, Free: 3.5 pg/mL (ref 2.3–4.2)

## 2023-08-23 LAB — POCT GLYCOSYLATED HEMOGLOBIN (HGB A1C): Hemoglobin A1C: 7.1 % — AB (ref 4.0–5.6)

## 2023-08-23 LAB — T4, FREE: Free T4: 1.4 ng/dL (ref 0.60–1.60)

## 2023-08-23 NOTE — Patient Instructions (Signed)
Same diabetes medications, no change.  Lab for thyroid today.

## 2023-08-23 NOTE — Progress Notes (Unsigned)
Outpatient Endocrinology Note [place] Lexey Fletes, MD  08/24/23  Patient's Name: Brittney [name]    DOB: 10/04/1946    MRN: [id number]                                                    REASON OF VISIT: Follow up for type 2 diabetes mellitus /multinodular goiter  PCP: Mirna Mires, MD  HISTORY OF PRESENT ILLNESS:   Brittney [name] is a 77 y.o. old female with past medical history listed below, is here for follow up of type 2 diabetes mellitus /multinodular goiter.   Pertinent Diabetes History: Patient was diagnosed with type 2 diabetes mellitus in 1976.  She has been on insulin for several years.  Chronic Diabetes Complications : Retinopathy: no. Last ophthalmology exam was done on annually, reportedly. Nephropathy: CKD Peripheral neuropathy: yes, following with podiatry.  She has numbness of the toes.  She has been using diabetic shoes. Coronary artery disease: no Stroke: no  Relevant comorbidities and cardiovascular risk factors: Obesity: yes Body mass index is 30.32 kg/m.  Hypertension: yes Hyperlipidemia.  The lipid abnormality consists of elevated LDL; she stopped her pravastatin because of symptoms of muscle aches and cramping She was taking simvastatin 2/7 days a week without side effects but now she is not taking it claiming that it may have Caused muscle aches with regular administration  Current / Home Diabetic regimen includes: Tresiba 20 units in the morning. Humalog as needed for glucose > 200 as per sliding scale 4 to 7 units.  Has not been requiring much lately. Mounjaro 5mg  weekly.   Prior diabetic medications: She used to be on metformin was later stopped unclear reason. Invokana ?  Headache, Victoza ?  Swelling of the neck.  B Byetta.  Lantus.  Glycemic data:    CONTINUOUS GLUCOSE MONITORING SYSTEM (CGMS) INTERPRETATION: At today's visit, we reviewed CGM downloads. The full report is scanned in the media. Reviewing the CGM trends, blood glucose are as  follows:  Dexcom G7 CGM-  Sensor Download (Sensor download was reviewed and summarized below.) Dates: September 18 to August 22, 2023  Glucose Management Indicator: n/a% Sensor Average: 163 SD 30  Glycemic Trends:  <54: 0% 54-70: 0% 71-180: 75% 181-250: 25% 251-400: 0%  Interpretation: -Mostly acceptable blood sugar, occasional postprandial mild hyperglycemia with blood sugar up to 180s especially with breakfast and supper.  Overnight blood sugar acceptable.  No hypoglycemia.  Hypoglycemia: Patient has no hypoglycemic episodes. Patient has hypoglycemia awareness.  Factors modifying glucose control: 1.  Diabetic diet assessment: 2-3 meals a day.  2.  Staying active or exercising: Walking  3.  Medication compliance: compliant all of the time.  # Multinodular goiter -Patient has longstanding history of multinodular goiter since at least 2003.  She has occasional palpitation for years.  Toprol was prescribed by cardiologist.  Because of large size of goiter she was interested in getting surgery done since her I-131 uptake was not contributive to I-131 treatment.  She was hesitant about surgery and was referred to radiofrequency ablation. -She had radiofrequency ablation done on her right dominant thyroid nodule in May 2024 which was measuring 5.7 cm, this nodule size has been decreasing.  Her goiter has been autonomous with no evidence of overt hyperthyroidism with normal free T4 and free T3 and TSH has been consistently suppressed.  RAI uptake and scan in December 2024 showed homogeneous tracer distribution in both thyroid lobe.  Dominant cold nodule inferior right lobe and probable nodule upper pole left lobe.  Normal 4-hour and 24-hour radioiodine uptakes.  US thyroid : 01/17/2023: Moderately heterogeneous.  There was 5.7 x 5.5 x 3.6 cm isoechoic ill-defined nodule in the upper pole of right lobe of thyroid, grossly unchanged compared to ultrasound in January 2014, previously 2.2 cm.   Approximately 3.9 isoechoic ill-defined nodules/pseudonodule within the mid aspect of the right lobe of the thyroid, unchanged from 2017.  Approximately 3.7 isoechoic ill-defined nodule/lesion noted within the mid aspect of the right thyroid lobe of thyroid unchanged.  Approximately 2.7 cm hypoechoic ill-defined nodule inferior pole of right lobe unchanged.  Questionable 1.5 cm isoechoic ill-defined nodule on the left superior pole and 2.1 cm isoechoic ill-defined nodule in the mid aspect of the left thyroid lobe unchanged.  Other left thyroid nodule measuring 2.0 cm and 3.2 cm ill-defined.  Overall similar finding of thyromegaly and multinodular goiter without discrete worrisome new or enlarging thyroid nodules.  Overall unchanged compared to 2014.  -On April 2024 patient had FNA of right ill-defined superior pole isoechoic nodule measuring 5.7 cm 2 times with benign cytology consistent with benign follicular nodule.  Interval history 08/24/23 Dexcom CGM data as reviewed above.  Patient has been taking Mounjaro 5 mg weekly and complains of significant constipation however she started to take Colace and stool softener.  Not so much of nausea and vomiting.  She has noticed decreasing size of thyroid after radiofrequency ablation.  Lately she has been having symptoms of occasional palpitation and increased sweating.  No other complaints today.  REVIEW OF SYSTEMS As per history of present illness.   PAST MEDICAL HISTORY: Past Medical History:  Diagnosis Date   Atrial fibrillation (HCC)    Paroxysmal; LVH; nl EF; onset in 1999   Burning with urination 02/23/2016   Diabetes mellitus    insulin; managed by Dr. 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   Hyperlipidemia    Hypertension    Hypothyroidism    history of goiter; nl TSH off medication   Itching with irritation 02/27/2015   Obesity 07/06/2012   Palpitations    negative event recorder in 2009   UTI (urinary  tract infection) 02/23/2016    PAST SURGICAL HISTORY: Past Surgical History:  Procedure Laterality Date   BIOPSY THYROID     CESAREAN SECTION     CHOLECYSTECTOMY  11/95   COLONOSCOPY  2007   IR RADIOLOGIST EVAL & MGMT  02/10/2023   IR RADIOLOGIST EVAL & MGMT  08/02/2023   RETINAL DETACHMENT SURGERY  2009   TUBAL LIGATION  1980's    ALLERGIES: Allergies  Allergen Reactions   Latex     Itching and breaking out   Pravastatin     Pt stated, "Made my muscle ache"    FAMILY HISTORY:  Family History  Problem Relation Age of Onset   Diabetes Mother    Diabetes Daughter        gestational diabetes   Cancer Father    Thyroid disease Brother    Other Son        MVA    SOCIAL HISTORY: Social History   Socioeconomic History   Marital status: Legally Separated    Spouse name: Not on file   Number of children: Not on file   Years of education: Not on file  Highest education level: Not on file  Occupational History   Occupation: Retired  Tobacco Use   Smoking status: Former    Current packs/day: 0.00    Average packs/day: 1 pack/day for 19.7 years (19.7 ttl pk-yrs)    Types: Cigarettes    Start date: 11/21/1966    Quit date: 08/13/1986    Years since quitting: 37.0   Smokeless tobacco: Never  Vaping Use   Vaping status: Never Used  Substance and Sexual Activity   Alcohol use: No    Alcohol/week: 0.0 standard drinks of alcohol   Drug use: No   Sexual activity: Not Currently    Birth control/protection: Post-menopausal, Surgical    Comment: tubal  Other Topics Concern   Not on file  Social History Narrative   No regular exercise   Social Determinants of Health   Financial Resource Strain: Not on file  Food Insecurity: Not on file  Transportation Needs: Not on file  Physical Activity: Not on file  Stress: Not on file  Social Connections: Not on file    MEDICATIONS:  Current Outpatient Medications  Medication Sig Dispense Refill   aspirin 81 MG chewable  tablet Chew 81 mg by mouth daily.     Blood Glucose Monitoring Suppl (ACCU-CHEK GUIDE ME) w/Device KIT Use to check blood sugar 2 times per day dx code E11.65 1 kit 0   cloNIDine (CATAPRES) 0.1 MG tablet TAKE (1) TABLET BY MOUTH TWICE DAILY. 180 tablet 1   Continuous Blood Gluc Sensor (DEXCOM G7 SENSOR) MISC Change every 10 days 9 each 4   gentamicin cream (GARAMYCIN) 0.1 % Apply to affected area once daily. 30 g 0   glucose blood (ACCU-CHEK GUIDE) test strip Use as instructed 100 strip 11   HUMALOG KWIKPEN 100 UNIT/ML KwikPen INJECT 6 TO 7 UNITS SUBCUTANEOUSLY BEFORE SUPPER IF GLUCOSE IS HIGH. 15 mL 0   hydrochlorothiazide (MICROZIDE) 12.5 MG capsule Take 1 capsule (12.5 mg total) by mouth daily. 90 capsule 0   insulin degludec (TRESIBA FLEXTOUCH) 100 UNIT/ML FlexTouch Pen Inject 20 Units into the skin daily. 3 mL 5   Insulin Pen Needle (B-D ULTRAFINE III SHORT PEN) 31G X 8 MM MISC USE  2-3 TIMES A DAY. 100 each 4   Lancets (ONETOUCH ULTRASOFT) lancets      metoprolol tartrate (LOPRESSOR) 50 MG tablet TAKE 1/2 TABLET BY MOUTH TWICE DAILY. 90 tablet 0   Multiple Vitamin (MULTIVITAMIN) tablet Take 1 tablet by mouth once a week.     omeprazole (PRILOSEC) 20 MG capsule Take 20 mg by mouth daily.     simvastatin (ZOCOR) 10 MG tablet TAKE 1 TABLET 2 TIMES A WEEK (Patient taking differently: Take 10 mg by mouth at bedtime.) 100 tablet 0   SURE COMFORT INSULIN SYRINGE 31G X 5/16" 0.3 ML MISC USE AS DIRECTED 2-3 TIMES A DAY. 100 each 0   tirzepatide (MOUNJARO) 5 MG/0.5ML Pen Inject 5 mg into the skin once a week. 6 mL 0   No current facility-administered medications for this visit.    PHYSICAL EXAM: Vitals:   08/23/23 1040  BP: 130/70  Pulse: 97  SpO2: 99%  Weight: 199 lb 6.4 oz (90.4 kg)  Height: 5\' 8"  (1.727 m)   Body mass index is 30.32 kg/m.  Wt Readings from Last 3 Encounters:  08/23/23 199 lb 6.4 oz (90.4 kg)  08/08/23 201 lb 12.8 oz (91.5 kg)  05/24/23 206 lb 4.8 oz (93.6 kg)     General: Well developed, well  nourished female in no apparent distress.  HEENT: AT/[place], no external lesions.  Eyes: Conjunctiva clear and no icterus. Neck: Neck supple, thyromegaly ~ 3 X normal  R > L, non tender Lungs: Respirations not labored Neurologic: Alert, oriented, normal speech Extremities / Skin: Dry. No sores or rashes noted.  Psychiatric: Does not appear depressed or anxious  Diabetic Foot Exam - Simple   No data filed    LABS Reviewed Lab Results  Component Value Date   HGBA1C 7.1 (A) 08/23/2023   HGBA1C 6.9 (A) 05/03/2023   HGBA1C 9.0 (A) 01/31/2023   Lab Results  Component Value Date   FRUCTOSAMINE 345 (H) 03/30/2020   FRUCTOSAMINE 342 (H) 09/08/2017   FRUCTOSAMINE 344 (H) 06/08/2017   Lab Results  Component Value Date   CHOL 152 08/09/2022   HDL 58.20 08/09/2022   LDLCALC 65 08/09/2022   TRIG 142.0 08/09/2022   CHOLHDL 3 08/09/2022   Lab Results  Component Value Date   MICRALBCREAT 1.2 11/02/2022   MICRALBCREAT 0.8 06/17/2021   Lab Results  Component Value Date   CREATININE 1.28 (H) 05/03/2023   Lab Results  Component Value Date   GFR 40.85 (L) 05/03/2023    ASSESSMENT / PLAN  1. Type 2 diabetes mellitus with hyperglycemia, with long-term current use of insulin (HCC)   2. Subclinical hyperthyroidism   3. Multinodular goiter   4. Encounter for immunization     Diabetes Mellitus type 2, complicated by CKD/diabetic neuropathy. - Diabetic status / severity: Fair control  Lab Results  Component Value Date   HGBA1C 7.1 (A) 08/23/2023    - Hemoglobin A1c goal : <7%  Discussed that constipation can be multifactorial can also be related with [facility] however with taking a stool softener at this time decided to continue on the current dose of Mounjaro 5 mg weekly.  - Medications: see below  I) continue Tresiba 20 units daily. II) continue Mounjaro 5 mg weekly. III) continue Humalog as per sliding scale 4 to 7 units with meals.  -  Home glucose testing: Dexcom G7 and check as needed. - Discussed/ Gave Hypoglycemia treatment plan.  # Consult : not required at this time.   # Annual urine for microalbuminuria/ creatinine ratio, no microalbuminuria currently. Last  Lab Results  Component Value Date   MICRALBCREAT 1.2 11/02/2022    # Foot check nightly / neuropathy.  # Annual dilated diabetic eye exams.   - Diet: Make healthy diabetic food choices - Life style / activity / exercise: Discussed.  2. Blood pressure  -  BP Readings from Last 1 Encounters:  08/23/23 130/70    - Control is in target.  - No change in current plans.  Patient mentions she has not been taking antihypertensive medications and her blood pressure has remained controlled.  She has been monitoring blood pressure at home.  Advised to talk with primary care provider for the adjustment of antihypertensive medication.  3. Lipid status / Hyperlipidemia - Last  Lab Results  Component Value Date   LDLCALC 65 08/09/2022   - Continue simvastatin 10 mg daily.  # Multinodular goiter -She has good response with radiofrequency ablation in May 2024 of dominant right thyroid nodule measuring 5.7 cm.  She has been following with radiology and ultrasound in July 27, 2023 showed decreasing size of this nodule. -Will check thyroid function test.  Diagnoses and all orders for this visit:  Type 2 diabetes mellitus with hyperglycemia, with long-term current use of insulin (HCC) -  POCT glycosylated hemoglobin (Hb A1C)  Subclinical hyperthyroidism -     T4, free; Future -     TSH; Future -     T3, free; Future -     T3, free -     TSH -     T4, free  Multinodular goiter -     T4, free; Future -     TSH; Future -     T3, free; Future -     T3, free -     TSH -     T4, free  Encounter for immunization -     Flu Vaccine Trivalent High Dose (Fluad)    DISPOSITION Follow up in clinic in 3 months suggested.   All questions answered  and patient verbalized understanding of the plan.  [place] Keno Caraway, MD [facility] Endocrinology [facility] [address] [place], [address] [place], [place] [address] Phone # [phone]  At least part of this note was generated using voice recognition software. Inadvertent word errors may have occurred, which were not recognized during the proofreading process.

## 2023-08-24 ENCOUNTER — Encounter: Payer: Self-pay | Admitting: Endocrinology

## 2023-08-25 ENCOUNTER — Other Ambulatory Visit: Payer: Self-pay | Admitting: Endocrinology

## 2023-08-25 ENCOUNTER — Telehealth: Payer: Self-pay

## 2023-08-25 DIAGNOSIS — E1165 Type 2 diabetes mellitus with hyperglycemia: Secondary | ICD-10-CM

## 2023-08-25 DIAGNOSIS — E042 Nontoxic multinodular goiter: Secondary | ICD-10-CM

## 2023-08-25 DIAGNOSIS — E7800 Pure hypercholesterolemia, unspecified: Secondary | ICD-10-CM

## 2023-08-25 DIAGNOSIS — E0590 Thyrotoxicosis, unspecified without thyrotoxic crisis or storm: Secondary | ICD-10-CM

## 2023-08-25 NOTE — Telephone Encounter (Signed)
-----   Message from [place] Thapa sent at 08/25/2023  8:03 AM EDT ----- Labs reviewed normal free T4 and free T3.  TSH is improving.  No plan for thyroid medication at this time. Please arrange for labs 1 week prior to follow-up visit in January.  I have placed orders.

## 2023-08-25 NOTE — Telephone Encounter (Signed)
Attempting to call patient regarding lab results, no answer. VM left requesting return call

## 2023-08-29 ENCOUNTER — Encounter: Payer: Self-pay | Admitting: Family Medicine

## 2023-08-29 DIAGNOSIS — Z1231 Encounter for screening mammogram for malignant neoplasm of breast: Secondary | ICD-10-CM

## 2023-08-30 ENCOUNTER — Other Ambulatory Visit (HOSPITAL_COMMUNITY): Payer: Self-pay | Admitting: Family Medicine

## 2023-08-30 DIAGNOSIS — Z1231 Encounter for screening mammogram for malignant neoplasm of breast: Secondary | ICD-10-CM

## 2023-09-04 ENCOUNTER — Ambulatory Visit (HOSPITAL_COMMUNITY)
Admission: RE | Admit: 2023-09-04 | Discharge: 2023-09-04 | Disposition: A | Discharge status: Home / Self Care | Payer: 59 | Source: Ambulatory Visit | Attending: Family Medicine | Admitting: Family Medicine

## 2023-09-04 DIAGNOSIS — Z1231 Encounter for screening mammogram for malignant neoplasm of breast: Secondary | ICD-10-CM | POA: Insufficient documentation

## 2023-09-12 ENCOUNTER — Encounter (INDEPENDENT_AMBULATORY_CARE_PROVIDER_SITE_OTHER): Payer: 59 | Admitting: Ophthalmology

## 2023-09-12 DIAGNOSIS — I10 Essential (primary) hypertension: Secondary | ICD-10-CM | POA: Diagnosis not present

## 2023-09-12 DIAGNOSIS — Z794 Long term (current) use of insulin: Secondary | ICD-10-CM | POA: Diagnosis not present

## 2023-09-12 DIAGNOSIS — E113593 Type 2 diabetes mellitus with proliferative diabetic retinopathy without macular edema, bilateral: Secondary | ICD-10-CM

## 2023-09-12 DIAGNOSIS — H43812 Vitreous degeneration, left eye: Secondary | ICD-10-CM

## 2023-09-12 DIAGNOSIS — Z7984 Long term (current) use of oral hypoglycemic drugs: Secondary | ICD-10-CM | POA: Diagnosis not present

## 2023-09-12 DIAGNOSIS — H35372 Puckering of macula, left eye: Secondary | ICD-10-CM

## 2023-09-12 DIAGNOSIS — H35033 Hypertensive retinopathy, bilateral: Secondary | ICD-10-CM

## 2023-09-27 ENCOUNTER — Ambulatory Visit: Payer: 59 | Admitting: Adult Health

## 2023-10-03 ENCOUNTER — Encounter: Payer: Self-pay | Admitting: Adult Health

## 2023-10-03 ENCOUNTER — Ambulatory Visit: Payer: 59 | Admitting: Adult Health

## 2023-10-03 VITALS — BP 121/73 | HR 90 | Ht 67.0 in | Wt 203.0 lb

## 2023-10-03 DIAGNOSIS — R3911 Hesitancy of micturition: Secondary | ICD-10-CM

## 2023-10-03 LAB — POCT URINALYSIS DIPSTICK OB
Blood, UA: NEGATIVE
Glucose, UA: NEGATIVE
Ketones, UA: NEGATIVE
Leukocytes, UA: NEGATIVE
Nitrite, UA: NEGATIVE
POC,PROTEIN,UA: NEGATIVE

## 2023-10-03 NOTE — Progress Notes (Signed)
  Subjective:     Patient ID: Brittney [name], female   DOB: 16-Sep-1946, 77 y.o.   MRN: [id number]  HPI Shulamis is a 77 year old black female,separated PM in complaining of urinary hesitancy, no burning or pain. She has lost 40 lbs on mounjaro.   PCP is Dr [name],  Review of Systems +urinary hesitancy May have some UI at night  Denies any burning or pain Reviewed past medical,surgical, social and family history. Reviewed medications and allergies.     Objective:   Physical Exam BP 121/73 (BP Location: Left Arm, Patient Position: Sitting, Cuff Size: Normal)   Pulse 90   Ht 5\' 7"  (1.702 m)   Wt 203 lb (92.1 kg)   BMI 31.79 kg/m  Urine dipstick is negative Skin warm and dry.  Lungs: clear to ausculation bilaterally. Cardiovascular: regular rate and rhythm.  Fall risk is low    Assessment:     1. Urinary hesitancy +hesitancy no burning or pain Try double voiding and increasing water If not better, come back and will do pelvic exam  - POC Urinalysis Dipstick OB     Plan:     Follow up prn

## 2023-10-09 DIAGNOSIS — N1831 Chronic kidney disease, stage 3a: Secondary | ICD-10-CM | POA: Diagnosis not present

## 2023-10-09 DIAGNOSIS — E042 Nontoxic multinodular goiter: Secondary | ICD-10-CM | POA: Diagnosis not present

## 2023-10-09 DIAGNOSIS — Z0001 Encounter for general adult medical examination with abnormal findings: Secondary | ICD-10-CM | POA: Diagnosis not present

## 2023-10-09 DIAGNOSIS — E1122 Type 2 diabetes mellitus with diabetic chronic kidney disease: Secondary | ICD-10-CM | POA: Diagnosis not present

## 2023-10-09 DIAGNOSIS — E1142 Type 2 diabetes mellitus with diabetic polyneuropathy: Secondary | ICD-10-CM | POA: Diagnosis not present

## 2023-10-09 DIAGNOSIS — I10 Essential (primary) hypertension: Secondary | ICD-10-CM | POA: Diagnosis not present

## 2023-10-09 DIAGNOSIS — E782 Mixed hyperlipidemia: Secondary | ICD-10-CM | POA: Diagnosis not present

## 2023-11-06 ENCOUNTER — Ambulatory Visit: Payer: 59 | Admitting: Podiatry

## 2023-11-08 ENCOUNTER — Encounter: Payer: Self-pay | Admitting: Podiatry

## 2023-11-08 ENCOUNTER — Ambulatory Visit: Payer: 59 | Admitting: Podiatry

## 2023-11-08 VITALS — Ht 67.0 in | Wt 203.0 lb

## 2023-11-08 DIAGNOSIS — M79674 Pain in right toe(s): Secondary | ICD-10-CM

## 2023-11-08 DIAGNOSIS — B351 Tinea unguium: Secondary | ICD-10-CM

## 2023-11-08 DIAGNOSIS — L84 Corns and callosities: Secondary | ICD-10-CM

## 2023-11-08 DIAGNOSIS — I739 Peripheral vascular disease, unspecified: Secondary | ICD-10-CM

## 2023-11-08 DIAGNOSIS — E1142 Type 2 diabetes mellitus with diabetic polyneuropathy: Secondary | ICD-10-CM | POA: Diagnosis not present

## 2023-11-08 DIAGNOSIS — M79675 Pain in left toe(s): Secondary | ICD-10-CM

## 2023-11-09 ENCOUNTER — Other Ambulatory Visit: Payer: Self-pay

## 2023-11-09 ENCOUNTER — Telehealth: Payer: Self-pay | Admitting: Endocrinology

## 2023-11-09 DIAGNOSIS — E119 Type 2 diabetes mellitus without complications: Secondary | ICD-10-CM

## 2023-11-09 MED ORDER — TIRZEPATIDE 5 MG/0.5ML ~~LOC~~ SOAJ: 5.0000 mg | SUBCUTANEOUS | 0 refills | Status: DC

## 2023-11-09 NOTE — Telephone Encounter (Signed)
MEDICATION: tirzepatide tirzepatide Greggory Keen) 5 MG/0.5ML Pen  PHARMACY:   [facility] - [place], [place] - [address] [place] (Ph: [phone])   HAS THE PATIENT CONTACTED THEIR PHARMACY?  Yes  IS THIS A 90 DAY SUPPLY : Yes  IS PATIENT OUT OF MEDICATION: Yes  IF NOT; HOW MUCH IS LEFT:   LAST APPOINTMENT DATE: @10 /2/24  NEXT APPOINTMENT DATE:@1 /04/2024  DO WE HAVE YOUR PERMISSION TO LEAVE A DETAILED MESSAGE?: Yes  OTHER COMMENTS:    **Let patient know to contact pharmacy at the end of the day to make sure medication is ready. **  ** Please notify patient to allow 48-72 hours to process**  **Encourage patient to contact the pharmacy for refills or they can request refills through [facility]**

## 2023-11-16 NOTE — Progress Notes (Signed)
Subjective:  Patient ID: Brittney [name], female    DOB: 03-Sep-1946,  MRN: [id number]  77 y.o. female presents with at risk footcare. Patient has h/o diabetes, neuropathy and PAD and is seen for  and preulcerative lesion(s) left foot and painful mycotic toenails that limit ambulation. Painful toenails interfere with ambulation. Aggravating factors include wearing enclosed shoe gear. Pain is relieved with periodic professional debridement. Painful preulcerative lesion(s) is/are aggravated when weightbearing with and without shoegear. Pain is relieved with periodic professional debridement. Chief Complaint  Patient presents with   Nail Problem    Pt is here for [facility] last A1C was 7.6 PCP is Dr [name] and LOV was in September.     PCP: Mirna Mires, MD.  New problem(s): None.   Review of Systems: Negative except as noted in the HPI.   Allergies  Allergen Reactions   Latex     Itching and breaking out   Pravastatin     Pt stated, "Made my muscle ache"    Objective:  There were no vitals filed for this visit. Constitutional Patient is a pleasant 77 y.o. female in NAD. AAO x 3.  Vascular Capillary fill time to digits <3 seconds.  DP/PT pulse(s) are faintly palpable b/l lower extremities. Pedal hair absent b/l. Lower extremity skin temperature gradient warm to cool b/l. No pain with calf compression b/l. No cyanosis or clubbing noted. No ischemia nor gangrene noted b/l.   Neurologic Protective sensation intact 5/5 intact bilaterally with 10g monofilament b/l. Vibratory sensation intact b/l. No clonus b/l.   Dermatologic Pedal skin is thin, shiny and atrophic b/l.  No open wounds b/l lower extremities. No interdigital macerations b/l lower extremities. Toenails 1-5 b/l elongated, discolored, dystrophic, thickened, crumbly with subungual debris and tenderness to dorsal palpation. Hyperkeratotic lesion(s) sub 5th met base left foot.  No erythema, no edema, no drainage, no fluctuance.  Orthopedic:  Muscle strength 5/5 to all LE muscle groups of right foot. Muscle strength 4/5to all LE muscle groups of left foot. Charcot deformity noted left foot.   Last HgA1c:     Latest Ref Rng & Units 08/23/2023   10:54 AM 05/03/2023   10:12 AM 01/31/2023    9:23 AM  Hemoglobin A1C  Hemoglobin-A1c 4.0 - 5.6 % 7.1  6.9  9.0      Assessment:   1. Pain due to onychomycosis of toenails of both feet   2. Callus   3. PAD (peripheral artery disease) (HCC)   4. Diabetic polyneuropathy associated with type 2 diabetes mellitus (HCC)    Plan:  Patient was evaluated and treated and all questions answered. Consent given for treatment as described below: -Continue supportive shoe gear daily. -Mycotic toenails 1-5 bilaterally were debrided in length and girth with sterile nail nippers and dremel without incident. -Callus(es) sub 5th met base left foot pared utilizing sterile scalpel blade without complication or incident. Total number debrided =1. -Patient/POA to call should there be question/concern in the interim.  Return in about 3 months (around 02/06/2024).  Freddie Breech, DPM      [facility] LOCATION: [address], [place] [address]  Office [phone]   [facility] LOCATION: [address] [place], [place] [address] Office [phone]

## 2023-11-17 DIAGNOSIS — J069 Acute upper respiratory infection, unspecified: Secondary | ICD-10-CM | POA: Diagnosis not present

## 2023-11-27 ENCOUNTER — Ambulatory Visit: Payer: 59 | Admitting: Endocrinology

## 2023-12-05 ENCOUNTER — Ambulatory Visit (HOSPITAL_COMMUNITY): Admission: RE | Admit: 2023-12-05 | Payer: 59 | Source: Ambulatory Visit

## 2023-12-06 ENCOUNTER — Other Ambulatory Visit: Payer: Self-pay

## 2023-12-06 ENCOUNTER — Encounter (HOSPITAL_COMMUNITY): Payer: 59

## 2023-12-06 ENCOUNTER — Ambulatory Visit: Payer: 59

## 2023-12-06 DIAGNOSIS — I10 Essential (primary) hypertension: Secondary | ICD-10-CM

## 2023-12-06 MED ORDER — CLONIDINE HCL 0.1 MG PO TABS: ORAL_TABLET | ORAL | 1 refills | Status: DC

## 2023-12-07 ENCOUNTER — Encounter: Payer: Self-pay | Admitting: Endocrinology

## 2023-12-07 ENCOUNTER — Ambulatory Visit (INDEPENDENT_AMBULATORY_CARE_PROVIDER_SITE_OTHER): Payer: 59 | Admitting: Endocrinology

## 2023-12-07 VITALS — BP 136/72 | HR 95 | Resp 20 | Ht 67.0 in | Wt 196.6 lb

## 2023-12-07 DIAGNOSIS — Z794 Long term (current) use of insulin: Secondary | ICD-10-CM

## 2023-12-07 DIAGNOSIS — E0590 Thyrotoxicosis, unspecified without thyrotoxic crisis or storm: Secondary | ICD-10-CM

## 2023-12-07 DIAGNOSIS — E1165 Type 2 diabetes mellitus with hyperglycemia: Secondary | ICD-10-CM

## 2023-12-07 DIAGNOSIS — E042 Nontoxic multinodular goiter: Secondary | ICD-10-CM

## 2023-12-07 LAB — POCT GLYCOSYLATED HEMOGLOBIN (HGB A1C): Hemoglobin A1C: 7 % — AB (ref 4.0–5.6)

## 2023-12-07 NOTE — Progress Notes (Signed)
Outpatient Endocrinology Note Brittney Lizzeth Meder, MD  12/07/23  Patient's Name: Brittney [name]    DOB: July 05, 1946    MRN: [id number]                                                    REASON OF VISIT: Follow up for type 2 diabetes mellitus /multinodular goiter  PCP: Mirna Mires, MD  HISTORY OF PRESENT ILLNESS:   [name] is a 78 y.o. old female with past medical history listed below, is here for follow up of type 2 diabetes mellitus / multinodular goiter.   Pertinent Diabetes History: Patient was diagnosed with type 2 diabetes mellitus in 1976.  She has been on insulin for several years.  Chronic Diabetes Complications : Retinopathy: no. Last ophthalmology exam was done on annually, reportedly. Nephropathy: CKD Peripheral neuropathy: yes, following with podiatry.  She has numbness of the toes.  She has been using diabetic shoes. Coronary artery disease: no Stroke: no  Relevant comorbidities and cardiovascular risk factors: Obesity: yes Body mass index is 30.79 kg/m.  Hypertension: yes Hyperlipidemia.  The lipid abnormality consists of elevated LDL; she stopped her pravastatin because of symptoms of muscle aches and cramping She was taking simvastatin 2/7 days a week without side effects but now she is not taking it claiming that it may have Caused muscle aches with regular administration  Current / Home Diabetic regimen includes: Tresiba 20 units in the morning. Humalog as needed for glucose > 200 as per sliding scale 4 to 7 units.  Has not been requiring much lately. Mounjaro 5 mg weekly.   Prior diabetic medications: She used to be on metformin was later stopped unclear reason. Invokana ?  Headache, Victoza ?  Swelling of the neck.  B Byetta.  Lantus.  Glycemic data:    CONTINUOUS GLUCOSE MONITORING SYSTEM (CGMS) INTERPRETATION: At today's visit, we reviewed CGM downloads. The full report is scanned in the media. Reviewing the CGM trends, blood glucose are as  follows:  Dexcom G7 CGM-  Sensor Download (Sensor download was reviewed and summarized below.) Dates: January through 2 December 07, 2023, 14 days  Glucose Management Indicator: 7% Sensor usage : 93%     Interpretation: Mostly acceptable blood sugar with occasional hyperglycemia with blood sugar up to 180s related with meals.  No significant hyperglycemia.  No hypoglycemia.  Hypoglycemia: Patient has no hypoglycemic episodes. Patient has hypoglycemia awareness.  Factors modifying glucose control: 1.  Diabetic diet assessment: 2-3 meals a day.  2.  Staying active or exercising: Walking  3.  Medication compliance: compliant all of the time.  # Multinodular goiter -Patient has longstanding history of multinodular goiter since at least 2003.  She has occasional palpitation for years.  Toprol was prescribed by cardiologist.  Because of large size of goiter she was interested in getting surgery done since her I-131 uptake was not contributive to I-131 treatment.  She was hesitant about surgery and was referred to radiofrequency ablation. -She had radiofrequency ablation done on her right dominant thyroid nodule in May 2024 which was measuring 5.7 cm, this nodule size has been decreasing.  Her goiter has been autonomous with no evidence of overt hyperthyroidism with normal free T4 and free T3 and TSH has been consistently suppressed.  RAI uptake and scan in December 2024 showed homogeneous tracer distribution  in both thyroid lobe.  Dominant cold nodule inferior right lobe and probable nodule upper pole left lobe.  Normal 4-hour and 24-hour radioiodine uptakes.  US thyroid : 01/17/2023: Moderately heterogeneous.  There was 5.7 x 5.5 x 3.6 cm isoechoic ill-defined nodule in the upper pole of right lobe of thyroid, grossly unchanged compared to ultrasound in January 2014, previously 2.2 cm.  Approximately 3.9 isoechoic ill-defined nodules/pseudonodule within the mid aspect of the right lobe of the  thyroid, unchanged from 2017.  Approximately 3.7 isoechoic ill-defined nodule/lesion noted within the mid aspect of the right thyroid lobe of thyroid unchanged.  Approximately 2.7 cm hypoechoic ill-defined nodule inferior pole of right lobe unchanged.  Questionable 1.5 cm isoechoic ill-defined nodule on the left superior pole and 2.1 cm isoechoic ill-defined nodule in the mid aspect of the left thyroid lobe unchanged.  Other left thyroid nodule measuring 2.0 cm and 3.2 cm ill-defined.  Overall similar finding of thyromegaly and multinodular goiter without discrete worrisome new or enlarging thyroid nodules.  Overall unchanged compared to 2014.  -On April 2024 patient had FNA of right ill-defined superior pole isoechoic nodule measuring 5.7 cm 2 times with benign cytology consistent with benign follicular nodule.  -Ultrasound thyroid in September 2024 :IMPRESSION: Decreased size of right superior thyroid nodule status post ablation, now 4.1 cm.  Interval history CGM data as reviewed above.  Hemoglobin A1c today 7.0%.  She has been taking Mounjaro 5 mg weekly.  She has not been requiring Humalog lately.  She complains of continued constipation she has been trying to supplement her Dulcolax and occasionally MiraLAX.  No nausea or vomiting.  Denies palpitation or heat intolerance.  She has noticed decrease in size of right thyroid nodule.  No other complaints today.  Patient is accompanied by daughter in the clinic today.  REVIEW OF SYSTEMS As per history of present illness.   PAST MEDICAL HISTORY: Past Medical History:  Diagnosis Date   Atrial fibrillation (HCC)    Paroxysmal; LVH; nl EF; onset in 1999   Burning with urination 02/23/2016   Diabetes mellitus    insulin; managed by Dr. 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   Hyperlipidemia    Hypertension    Hypothyroidism    history of goiter; nl TSH off medication   Itching with irritation 02/27/2015   Obesity  07/06/2012   Palpitations    negative event recorder in 2009   UTI (urinary tract infection) 02/23/2016    PAST SURGICAL HISTORY: Past Surgical History:  Procedure Laterality Date   BIOPSY THYROID     CESAREAN SECTION     CHOLECYSTECTOMY  11/95   COLONOSCOPY  2007   IR RADIOLOGIST EVAL & MGMT  02/10/2023   IR RADIOLOGIST EVAL & MGMT  08/02/2023   RETINAL DETACHMENT SURGERY  2009   TUBAL LIGATION  1980's    ALLERGIES: Allergies  Allergen Reactions   Latex     Itching and breaking out   Pravastatin     Pt stated, "Made my muscle ache"    FAMILY HISTORY:  Family History  Problem Relation Age of Onset   Diabetes Mother    Diabetes Daughter        gestational diabetes   Cancer Father    Thyroid disease Brother    Other Son        MVA    SOCIAL HISTORY: Social History   Socioeconomic History   Marital status: Legally Separated  Spouse name: Not on file   Number of children: 3   Years of education: Not on file   Highest education level: Not on file  Occupational History   Occupation: Retired  Tobacco Use   Smoking status: Former    Current packs/day: 0.00    Average packs/day: 1 pack/day for 19.7 years (19.7 ttl pk-yrs)    Types: Cigarettes    Start date: 11/21/1966    Quit date: 08/13/1986    Years since quitting: 37.3   Smokeless tobacco: Never  Vaping Use   Vaping status: Never Used  Substance and Sexual Activity   Alcohol use: No    Alcohol/week: 0.0 standard drinks of alcohol   Drug use: No   Sexual activity: Not Currently    Birth control/protection: Post-menopausal, Surgical    Comment: tubal  Other Topics Concern   Not on file  Social History Narrative   No regular exercise   Social Drivers of Health   Financial Resource Strain: Not on file  Food Insecurity: Not on file  Transportation Needs: Not on file  Physical Activity: Not on file  Stress: Not on file  Social Connections: Not on file    MEDICATIONS:  Current Outpatient Medications   Medication Sig Dispense Refill   aspirin 81 MG chewable tablet Chew 81 mg by mouth daily.     Blood Glucose Monitoring Suppl (ACCU-CHEK GUIDE ME) w/Device KIT Use to check blood sugar 2 times per day dx code E11.65 1 kit 0   cloNIDine (CATAPRES) 0.1 MG tablet TAKE (1) TABLET BY MOUTH TWICE DAILY. 180 tablet 1   Continuous Blood Gluc Sensor (DEXCOM G7 SENSOR) MISC Change every 10 days 9 each 4   gentamicin cream (GARAMYCIN) 0.1 % Apply to affected area once daily. 30 g 0   glucose blood (ACCU-CHEK GUIDE) test strip Use as instructed 100 strip 11   HUMALOG KWIKPEN 100 UNIT/ML KwikPen INJECT 6 TO 7 UNITS SUBCUTANEOUSLY BEFORE SUPPER IF GLUCOSE IS HIGH. 15 mL 0   hydrochlorothiazide (MICROZIDE) 12.5 MG capsule Take 1 capsule (12.5 mg total) by mouth daily. 90 capsule 0   insulin degludec (TRESIBA FLEXTOUCH) 100 UNIT/ML FlexTouch Pen Inject 20 Units into the skin daily. 3 mL 5   Insulin Pen Needle (B-D ULTRAFINE III SHORT PEN) 31G X 8 MM MISC USE  2-3 TIMES A DAY. 100 each 4   Lancets (ONETOUCH ULTRASOFT) lancets      metoprolol tartrate (LOPRESSOR) 50 MG tablet TAKE 1/2 TABLET BY MOUTH TWICE DAILY. 90 tablet 0   Multiple Vitamin (MULTIVITAMIN) tablet Take 1 tablet by mouth once a week.     omeprazole (PRILOSEC) 20 MG capsule Take 20 mg by mouth daily.     simvastatin (ZOCOR) 10 MG tablet TAKE 1 TABLET 2 TIMES A WEEK (Patient taking differently: Take 10 mg by mouth at bedtime.) 100 tablet 0   SURE COMFORT INSULIN SYRINGE 31G X 5/16" 0.3 ML MISC USE AS DIRECTED 2-3 TIMES A DAY. 100 each 0   tirzepatide (MOUNJARO) 5 MG/0.5ML Pen Inject 5 mg into the skin once a week. 6 mL 0   No current facility-administered medications for this visit.    PHYSICAL EXAM: Vitals:   12/07/23 0935  BP: 136/72  Pulse: 95  Resp: 20  SpO2: 96%  Weight: 196 lb 9.6 oz (89.2 kg)  Height: 5\' 7"  (1.702 m)    Body mass index is 30.79 kg/m.  Wt Readings from Last 3 Encounters:  12/07/23 196 lb 9.6 oz (89.2  kg)   11/08/23 203 lb (92.1 kg)  10/03/23 203 lb (92.1 kg)    General: Well developed, well nourished female in no apparent distress.  HEENT: AT/[place], no external lesions.  Eyes: Conjunctiva clear and no icterus. Neck: Neck supple, thyromegaly ~ 3 X normal  R > L, non tender, mobile. Lungs: Respirations not labored Neurologic: Alert, oriented, normal speech Extremities / Skin: Dry.   Psychiatric: Does not appear depressed or anxious  Diabetic Foot Exam - Simple   Simple Foot Form Visual Inspection See comments: Yes Sensation Testing See comments: Yes Pulse Check See comments: Yes Comments DP palpable bilaterally. Dystrophic nails , trimmed. Monofilament decreased bilaterally.     LABS Reviewed Lab Results  Component Value Date   HGBA1C 7.0 (A) 12/07/2023   HGBA1C 7.1 (A) 08/23/2023   HGBA1C 6.9 (A) 05/03/2023   Lab Results  Component Value Date   FRUCTOSAMINE 345 (H) 03/30/2020   FRUCTOSAMINE 342 (H) 09/08/2017   FRUCTOSAMINE 344 (H) 06/08/2017   Lab Results  Component Value Date   CHOL 152 08/09/2022   HDL 58.20 08/09/2022   LDLCALC 65 08/09/2022   TRIG 142.0 08/09/2022   CHOLHDL 3 08/09/2022   Lab Results  Component Value Date   MICRALBCREAT 1.2 11/02/2022   MICRALBCREAT 0.8 06/17/2021   Lab Results  Component Value Date   CREATININE 1.28 (H) 05/03/2023   Lab Results  Component Value Date   GFR 40.85 (L) 05/03/2023    ASSESSMENT / PLAN  1. Type 2 diabetes mellitus with hyperglycemia, with long-term current use of insulin (HCC)   2. Subclinical hyperthyroidism   3. Multinodular goiter    Diabetes Mellitus type 2, complicated by CKD/diabetic neuropathy. - Diabetic status / severity: Fair control  Lab Results  Component Value Date   HGBA1C 7.0 (A) 12/07/2023    - Hemoglobin A1c goal : <7%  Discussed that constipation can be multifactorial can also be related with [facility] however with taking a stool softener at this time decided to continue on  the current dose of Mounjaro 5 mg weekly.  Advised to take MiraLAX every day.  Advised to eat vegetables every day.  - Medications: see below  I) continue Tresiba 20 units daily. II) continue Mounjaro 5 mg weekly. III) as needed Humalog as per sliding scale 4 to 7 units with meals.  - Home glucose testing: Dexcom G7 and check as needed. - Discussed/ Gave Hypoglycemia treatment plan.  # Consult : not required at this time.   # Annual urine for microalbuminuria/ creatinine ratio, no microalbuminuria currently.  Will check today. Last  Lab Results  Component Value Date   MICRALBCREAT 1.2 11/02/2022    # Foot check nightly / neuropathy.  Following with podiatry.  # Annual dilated diabetic eye exams.   - Diet: Make healthy diabetic food choices - Life style / activity / exercise: Discussed.  2. Blood pressure  -  BP Readings from Last 1 Encounters:  12/07/23 136/72    - Control is in target.  - No change in current plans.   3. Lipid status / Hyperlipidemia - Last  Lab Results  Component Value Date   LDLCALC 65 08/09/2022   - Continue simvastatin 10 mg daily.  Will check today.  # Multinodular goiter /subclinical hyperthyroidism -She has good response with radiofrequency ablation in May 2024 of dominant right thyroid nodule measuring 5.7 cm.  She has been following with radiology and ultrasound in July 27, 2023 showed decreasing size of this nodule.  She had thyroid function test consistent with subclinical hyperthyroidism.  Currently not on thyroid medication.  She is clinically euthyroid. -Will check thyroid function test.  Diagnoses and all orders for this visit:  Type 2 diabetes mellitus with hyperglycemia, with long-term current use of insulin (HCC) -     POCT glycosylated hemoglobin (Hb A1C) -     BASIC METABOLIC PANEL WITH GFR -     Microalbumin / creatinine urine ratio -     Lipid panel  Subclinical hyperthyroidism -     T4, free -     T3, free -      TSH  Multinodular goiter     DISPOSITION Follow up in clinic in 3 months suggested.   All questions answered and patient verbalized understanding of the plan.  Brittney Enrique Weiss, MD [facility] [address] [place], [address] [place], [place] [address] Phone # [phone]  At least part of this note was generated using voice recognition software. Inadvertent word errors may have occurred, which were not recognized during the proofreading process.

## 2023-12-08 LAB — BASIC METABOLIC PANEL WITH GFR
BUN/Creatinine Ratio: 19 (calc) (ref 6–22)
BUN: 22 mg/dL (ref 7–25)
CO2: 30 mmol/L (ref 20–32)
Calcium: 9.7 mg/dL (ref 8.6–10.4)
Chloride: 102 mmol/L (ref 98–110)
Creat: 1.14 mg/dL — ABNORMAL HIGH (ref 0.60–1.00)
Glucose, Bld: 134 mg/dL — ABNORMAL HIGH (ref 65–99)
Potassium: 5.2 mmol/L (ref 3.5–5.3)
Sodium: 138 mmol/L (ref 135–146)
eGFR: 50 mL/min/{1.73_m2} — ABNORMAL LOW (ref >=60)

## 2023-12-08 LAB — LIPID PANEL
Cholesterol: 179 mg/dL (ref <200)
HDL: 66 mg/dL (ref >=50)
LDL Cholesterol (Calc): 93 mg/dL
Non-HDL Cholesterol (Calc): 113 mg/dL (ref <130)
Total CHOL/HDL Ratio: 2.7 (calc) (ref <5.0)
Triglycerides: 107 mg/dL (ref <150)

## 2023-12-08 LAB — MICROALBUMIN / CREATININE URINE RATIO
Creatinine, Urine: 110 mg/dL (ref 20–275)
Microalb Creat Ratio: 2 mg/g{creat} (ref <30)
Microalb, Ur: 0.2 mg/dL

## 2023-12-08 LAB — T4, FREE: Free T4: 1.4 ng/dL (ref 0.8–1.8)

## 2023-12-08 LAB — TSH: TSH: 0.01 m[IU]/L — ABNORMAL LOW (ref 0.40–4.50)

## 2023-12-08 LAB — T3, FREE: T3, Free: 3.4 pg/mL (ref 2.3–4.2)

## 2023-12-23 ENCOUNTER — Other Ambulatory Visit: Payer: Self-pay | Admitting: Cardiology

## 2024-01-02 DIAGNOSIS — R051 Acute cough: Secondary | ICD-10-CM | POA: Diagnosis not present

## 2024-01-02 DIAGNOSIS — Z20822 Contact with and (suspected) exposure to covid-19: Secondary | ICD-10-CM | POA: Diagnosis not present

## 2024-01-03 ENCOUNTER — Ambulatory Visit: Payer: 59 | Admitting: Podiatry

## 2024-01-24 ENCOUNTER — Ambulatory Visit: Payer: 59

## 2024-01-24 ENCOUNTER — Ambulatory Visit (HOSPITAL_COMMUNITY): Payer: 59

## 2024-01-30 ENCOUNTER — Other Ambulatory Visit: Payer: Self-pay | Admitting: Endocrinology

## 2024-01-30 DIAGNOSIS — Z794 Long term (current) use of insulin: Secondary | ICD-10-CM

## 2024-01-31 ENCOUNTER — Ambulatory Visit: Payer: 59

## 2024-01-31 DIAGNOSIS — M2142 Flat foot [pes planus] (acquired), left foot: Secondary | ICD-10-CM

## 2024-01-31 DIAGNOSIS — E1142 Type 2 diabetes mellitus with diabetic polyneuropathy: Secondary | ICD-10-CM

## 2024-01-31 DIAGNOSIS — E11610 Type 2 diabetes mellitus with diabetic neuropathic arthropathy: Secondary | ICD-10-CM

## 2024-01-31 DIAGNOSIS — L84 Corns and callosities: Secondary | ICD-10-CM

## 2024-01-31 NOTE — Progress Notes (Signed)
 Patient presents to the office today for diabetic shoe and insole measuring.  Patient was measured with brannock device to determine size and width for 1 pair of extra depth shoes and foam casted for 3 pair of insoles.   Documentation of medical necessity will be sent to patient's treating diabetic doctor to verify and sign.   Patient's diabetic provider: [place] Thapa MD   Shoes and insoles will be ordered at that time and patient will be notified for an appointment for fitting when they arrive.   Shoe size (per patient): 11 Shoe choice:   [id number] (not Avail) A330W  Shoe size ordered: 11WD

## 2024-02-02 ENCOUNTER — Other Ambulatory Visit: Payer: Self-pay | Admitting: Cardiology

## 2024-02-08 ENCOUNTER — Ambulatory Visit: Payer: 59 | Admitting: Podiatry

## 2024-02-09 ENCOUNTER — Other Ambulatory Visit: Payer: Self-pay | Admitting: Endocrinology

## 2024-02-09 DIAGNOSIS — I10 Essential (primary) hypertension: Secondary | ICD-10-CM

## 2024-02-12 ENCOUNTER — Encounter: Payer: Self-pay | Admitting: *Deleted

## 2024-02-14 ENCOUNTER — Ambulatory Visit: Payer: 59 | Attending: Cardiology | Admitting: Cardiology

## 2024-02-14 ENCOUNTER — Encounter: Payer: Self-pay | Admitting: Cardiology

## 2024-02-14 VITALS — BP 130/78 | HR 89 | Ht 67.0 in | Wt 201.6 lb

## 2024-02-14 DIAGNOSIS — I10 Essential (primary) hypertension: Secondary | ICD-10-CM | POA: Diagnosis not present

## 2024-02-14 DIAGNOSIS — R002 Palpitations: Secondary | ICD-10-CM | POA: Diagnosis not present

## 2024-02-14 DIAGNOSIS — E782 Mixed hyperlipidemia: Secondary | ICD-10-CM

## 2024-02-14 MED ORDER — ROSUVASTATIN CALCIUM 5 MG PO TABS: 5.0000 mg | ORAL_TABLET | ORAL | 1 refills | Status: DC

## 2024-02-14 NOTE — Patient Instructions (Addendum)
 Medication Instructions:   Stop Simvastatin (Zocor) Begin Crestor 5mg  - take one tab weekly  Continue all other medications.     Labwork:  none  Testing/Procedures:  none  Follow-Up:  6 months   Any Other Special Instructions Will Be Listed Below (If Applicable).   If you need a refill on your cardiac medications before your next appointment, please call your pharmacy.

## 2024-02-14 NOTE — Progress Notes (Signed)
 Clinical Summary [name] is a 78 y.o.female seen today for follow up of the following medical problems.    1. Remote history of afib - from prior notes remote history, has not been committed to anticoag - infrequent palpitations, 2-3 times a month. Lasts just a few seconds.    ZIO monitor 04/27/2021 without any atrial fibrillation. Rare episodes of SVT and NSVT  - occasional papitations, she associates with increased stress  - brother recently passed     2.HTN -she is compliant with meds   3. Hyperlipidemia 12/2019 TC 191 TG 148 HDL 62 LDL 99 - muscle cramps on prior statins, history somewhat unclear - from my last visit was trying pravastatin 20mg  few days a week but had muscle cramps.  - endo changed her simvastatin 10mg , she is not taking  - convoluted history because she has muscle cramps with or without statin - 07/2022 TC 152 TG 142 HDL 58 LDL 65  Jan 2025 TC 179 TG 107 HDL 66 LDL 93 - she stopped simvastatin due to muscle cramps    4.DM2 - followed by endocrine   Past Medical History:  Diagnosis Date   Atrial fibrillation (HCC)    Paroxysmal; LVH; nl EF; onset in 1999   Burning with urination 02/23/2016   Diabetes mellitus    insulin; managed by Dr. Lucianne [name]   Diabetic Charcot's joint disease [facility])    left lower extremity   Hyperlipidemia    Hypertension    Hypothyroidism    history of goiter; nl TSH off medication   Itching with irritation 02/27/2015   Obesity 07/06/2012   Palpitations    negative event recorder in 2009   UTI (urinary tract infection) 02/23/2016     Allergies  Allergen Reactions   Latex     Itching and breaking out   Pravastatin     Pt stated, "Made my muscle ache"     Current Outpatient Medications  Medication Sig Dispense Refill   aspirin 81 MG chewable tablet Chew 81 mg by mouth daily.     Blood Glucose Monitoring Suppl (ACCU-CHEK GUIDE ME) w/Device KIT Use to check blood sugar 2 times per day dx code E11.65 1 kit 0    cloNIDine (CATAPRES) 0.1 MG tablet TAKE (1) TABLET BY MOUTH TWICE DAILY. 180 tablet 1   Continuous Blood Gluc Sensor (DEXCOM G7 SENSOR) MISC Change every 10 days 9 each 4   gentamicin cream (GARAMYCIN) 0.1 % Apply to affected area once daily. 30 g 0   glucose blood (ACCU-CHEK GUIDE) test strip Use as instructed 100 strip 11   HUMALOG KWIKPEN 100 UNIT/ML KwikPen INJECT 6 TO 7 UNITS SUBCUTANEOUSLY BEFORE SUPPER IF GLUCOSE IS HIGH. 15 mL 0   hydrochlorothiazide (MICROZIDE) 12.5 MG capsule Take 1 capsule (12.5 mg total) by mouth daily. 90 capsule 3   insulin degludec (TRESIBA FLEXTOUCH) 100 UNIT/ML FlexTouch Pen Inject 20 Units into the skin daily. 3 mL 5   Insulin Pen Needle (B-D ULTRAFINE III SHORT PEN) 31G X 8 MM MISC USE  2-3 TIMES A DAY. 100 each 4   Lancets (ONETOUCH ULTRASOFT) lancets      metoprolol tartrate (LOPRESSOR) 50 MG tablet TAKE 1/2 TABLET BY MOUTH TWICE DAILY. 90 tablet 0   Multiple Vitamin (MULTIVITAMIN) tablet Take 1 tablet by mouth once a week.     omeprazole (PRILOSEC) 20 MG capsule Take 20 mg by mouth daily.     simvastatin (ZOCOR) 10 MG tablet TAKE 1 TABLET  2 TIMES A WEEK (Patient taking differently: Take 10 mg by mouth at bedtime.) 100 tablet 0   SURE COMFORT INSULIN SYRINGE 31G X 5/16" 0.3 ML MISC USE AS DIRECTED 2-3 TIMES A DAY. 100 each 0   tirzepatide (MOUNJARO) 5 MG/0.5ML Pen Inject 5 mg into the skin once a week. 6 mL 3   No current facility-administered medications for this visit.     Past Surgical History:  Procedure Laterality Date   BIOPSY THYROID     CESAREAN SECTION     CHOLECYSTECTOMY  11/95   COLONOSCOPY  2007   IR RADIOLOGIST EVAL & MGMT  02/10/2023   IR RADIOLOGIST EVAL & MGMT  08/02/2023   RETINAL DETACHMENT SURGERY  2009   TUBAL LIGATION  1980's     Allergies  Allergen Reactions   Latex     Itching and breaking out   Pravastatin     Pt stated, "Made my muscle ache"      Family History  Problem Relation Age of Onset   Diabetes Mother     Diabetes Daughter        gestational diabetes   Cancer Father    Thyroid disease Brother    Other Son        MVA     Social History [name] reports that she quit smoking about 37 years ago. Her smoking use included cigarettes. She started smoking about 57 years ago. She has a 19.7 pack-year smoking history. She has never used smokeless tobacco. [name] reports no history of alcohol use.    Physical Examination Today's Vitals   02/14/24 0909  BP: 130/78  Pulse: 89  SpO2: 97%  Weight: 201 lb 9.6 oz (91.4 kg)  Height: 5\' 7"  (1.702 m)   Body mass index is 31.58 kg/m.  Gen: resting comfortably, no acute distress HEENT: no scleral icterus, pupils equal round and reactive, no palptable cervical adenopathy,  CV: RRR, no mrg, no jvd Resp: Clear to auscultation bilaterally GI: abdomen is soft, non-tender, non-distended, normal bowel sounds, no hepatosplenomegaly MSK: extremities are warm, no edema.  Skin: warm, no rash Neuro:  no focal deficits Psych: appropriate affect   Diagnostic Studies     Assessment and Plan  1. Remote history of afib - very remote history without clear recurrence, has not been committed to anticoag - prior monitor without afib, did have some SVT and NSVT -some increase in palpitations she relates to stress from her brother recently passing, she reports not to a degree where she would like to increase metoprolol.  - continue current meds - EKG shows NSR today   2. HTN - bp is at goal, continue current meds  3. Hyperlipidemia - reports muscle cramps on statins, convoluted history because she has cramps with or without statins and statins were tried by prior providers. History remains unclear - most recentl did not tolerate pravastatin just a few days a week, endo had tried simvastatin 10mg  which also was not tolerated - try crestor 5mg  just once weekly - primarily needs statin for her DM2 history and pleiotropic effects, her LDL alone is  reasonable without therapy.    F/u 6 months   Brittney [name], M.D.

## 2024-02-27 ENCOUNTER — Other Ambulatory Visit: Payer: Self-pay

## 2024-02-27 DIAGNOSIS — E1165 Type 2 diabetes mellitus with hyperglycemia: Secondary | ICD-10-CM

## 2024-02-27 MED ORDER — DEXCOM G7 SENSOR MISC: 4 refills | Status: AC

## 2024-03-13 ENCOUNTER — Other Ambulatory Visit: Payer: Self-pay

## 2024-03-13 ENCOUNTER — Ambulatory Visit (INDEPENDENT_AMBULATORY_CARE_PROVIDER_SITE_OTHER): Payer: 59 | Admitting: Endocrinology

## 2024-03-13 ENCOUNTER — Encounter: Payer: Self-pay | Admitting: Endocrinology

## 2024-03-13 VITALS — BP 110/62 | HR 77 | Resp 20 | Ht 67.0 in | Wt 200.2 lb

## 2024-03-13 DIAGNOSIS — E1165 Type 2 diabetes mellitus with hyperglycemia: Secondary | ICD-10-CM

## 2024-03-13 DIAGNOSIS — Z794 Long term (current) use of insulin: Secondary | ICD-10-CM

## 2024-03-13 DIAGNOSIS — E0590 Thyrotoxicosis, unspecified without thyrotoxic crisis or storm: Secondary | ICD-10-CM | POA: Diagnosis not present

## 2024-03-13 DIAGNOSIS — E042 Nontoxic multinodular goiter: Secondary | ICD-10-CM

## 2024-03-13 LAB — T3, FREE: T3, Free: 3.3 pg/mL (ref 2.3–4.2)

## 2024-03-13 LAB — POCT GLYCOSYLATED HEMOGLOBIN (HGB A1C): Hemoglobin A1C: 6.5 % — AB (ref 4.0–5.6)

## 2024-03-13 LAB — TSH: TSH: <0.01 m[IU]/L — ABNORMAL LOW (ref 0.40–4.50)

## 2024-03-13 LAB — T4, FREE: Free T4: 1.4 ng/dL (ref 0.8–1.8)

## 2024-03-13 MED ORDER — BD PEN NEEDLE SHORT U/F 31G X 8 MM MISC: 4 refills | Status: AC

## 2024-03-13 NOTE — Progress Notes (Signed)
 Outpatient Endocrinology Note Brittney Markeita Alicia, MD  03/13/24  Patient's Name: Brittney [name]    DOB: 1946-03-18    MRN: [id number]                                                    REASON OF VISIT: Follow up for type 2 diabetes mellitus /multinodular goiter  PCP: Wilburn Handler, MD  HISTORY OF PRESENT ILLNESS:   Brittney [name] is a 78 y.o. old female with past medical history listed below, is here for follow up of type 2 diabetes mellitus / multinodular goiter.   Pertinent Diabetes History: Patient was diagnosed with type 2 diabetes mellitus in 1976.  She has been on insulin  for several years.  Chronic Diabetes Complications : Retinopathy: no. Last ophthalmology exam was done on annually, reportedly. Nephropathy: CKD Peripheral neuropathy: yes, following with podiatry.  She has numbness of the toes.  She has been using diabetic shoes. Coronary artery disease: no Stroke: no  Relevant comorbidities and cardiovascular risk factors: Obesity: yes Body mass index is 31.36 kg/m.  Hypertension: yes Hyperlipidemia.  The lipid abnormality consists of elevated LDL; she stopped her pravastatin  because of symptoms of muscle aches and cramping She was taking simvastatin  2/7 days a week without side effects but now she is not taking it claiming that it may have Caused muscle aches with regular administration.  She is on Crestor  5 mg every week, managed by cardiology.  Current / Home Diabetic regimen includes: Tresiba  20 units in the morning. Humalog  as needed for glucose > 200 as per sliding scale 4 to 7 units.  Has not been requiring lately. Mounjaro  5 mg weekly.   Prior diabetic medications: She used to be on metformin  was later stopped unclear reason. Invokana  ?  Headache, Victoza  ?  Swelling of the neck.  B Byetta .  Lantus .  Glycemic data:    CONTINUOUS GLUCOSE MONITORING SYSTEM (CGMS) INTERPRETATION: At today's visit, we reviewed CGM downloads. The full report is scanned in the  media. Reviewing the CGM trends, blood glucose are as follows:  Dexcom G7 CGM-  Sensor Download (Sensor download was reviewed and summarized below.) Dates: April 9 to March 12, 2024, 14 days  Glucose Management Indicator: 6.8% Sensor usage : 67%      Interpretation: Acceptable blood sugar overnight, in between the meals.  Rare mild hyperglycemia with blood sugar up to 200 range postprandially.  No hypoglycemia.  Hypoglycemia: Patient has no hypoglycemic episodes. Patient has hypoglycemia awareness.  Factors modifying glucose control: 1.  Diabetic diet assessment: 2-3 meals a day.  2.  Staying active or exercising: Walking  3.  Medication compliance: compliant all of the time.  # Multinodular goiter -Patient has longstanding history of multinodular goiter since at least 2003.  She has occasional palpitation for years.  Toprol  was prescribed by cardiologist.  Because of large size of goiter she was interested in getting surgery done since her I-131 uptake was not contributive to I-131 treatment.  She was hesitant about surgery and was referred to radiofrequency ablation. -She had radiofrequency ablation done on her right dominant thyroid  nodule in May 2024 which was measuring 5.7 cm, this nodule size has been decreasing.  Her goiter has been autonomous with no evidence of overt hyperthyroidism with normal free T4 and free T3 and TSH has been consistently suppressed.  RAI uptake and scan in December 2024 showed homogeneous tracer distribution in both thyroid  lobe.  Dominant cold nodule inferior right lobe and probable nodule upper pole left lobe.  Normal 4-hour and 24-hour radioiodine uptakes.  US  thyroid  : 01/17/2023: Moderately heterogeneous.  There was 5.7 x 5.5 x 3.6 cm isoechoic ill-defined nodule in the upper pole of right lobe of thyroid , grossly unchanged compared to ultrasound in January 2014, previously 2.2 cm.  Approximately 3.9 isoechoic ill-defined nodules/pseudonodule within  the mid aspect of the right lobe of the thyroid , unchanged from 2017.  Approximately 3.7 isoechoic ill-defined nodule/lesion noted within the mid aspect of the right thyroid  lobe of thyroid  unchanged.  Approximately 2.7 cm hypoechoic ill-defined nodule inferior pole of right lobe unchanged.  Questionable 1.5 cm isoechoic ill-defined nodule on the left superior pole and 2.1 cm isoechoic ill-defined nodule in the mid aspect of the left thyroid  lobe unchanged.  Other left thyroid  nodule measuring 2.0 cm and 3.2 cm ill-defined.  Overall similar finding of thyromegaly and multinodular goiter without discrete worrisome new or enlarging thyroid  nodules.  Overall unchanged compared to 2014.  -On April 2024 patient had FNA of right ill-defined superior pole isoechoic nodule measuring 5.7 cm 2 times with benign cytology consistent with benign follicular nodule.  -Ultrasound thyroid  in September 2024 :IMPRESSION: Decreased size of right superior thyroid  nodule status post ablation, now 4.1 cm.  Interval history Hemoglobin A1c 6.5%.  Dexcom CGM data as reviewed above.  Diabetes regimen as reviewed and noted above.  She has complaints of numbness of fingertips.  Denies palpitation or heat intolerance.  No neck discomfort.  No other complaints today.   Patient is accompanied by daughter in the clinic today.  She has been on Crestor  5 mg weekly, started by cardiology.  Patient complains of some muscle pain.  Discussed/asked to continue for some time if the muscle pain resolves.  She states she has not been taking hydrochlorothiazide .  Her blood pressure is normal.  Discussed that it is okay not to take hydrochlorothiazide .  She can take as needed for pedal edema if happens.  REVIEW OF SYSTEMS As per history of present illness.   PAST MEDICAL HISTORY: Past Medical History:  Diagnosis Date   Atrial fibrillation (HCC)    Paroxysmal; LVH; nl EF; onset in 1999   Burning with urination 02/23/2016   Diabetes  mellitus    insulin ; managed by Dr. 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   Hyperlipidemia    Hypertension    Hypothyroidism    history of goiter; nl TSH off medication   Itching with irritation 02/27/2015   Obesity 07/06/2012   Palpitations    negative event recorder in 2009   UTI (urinary tract infection) 02/23/2016    PAST SURGICAL HISTORY: Past Surgical History:  Procedure Laterality Date   BIOPSY THYROID      CESAREAN SECTION     CHOLECYSTECTOMY  11/95   COLONOSCOPY  2007   IR RADIOLOGIST EVAL & MGMT  02/10/2023   IR RADIOLOGIST EVAL & MGMT  08/02/2023   RETINAL DETACHMENT SURGERY  2009   TUBAL LIGATION  1980's    ALLERGIES: Allergies  Allergen Reactions   Latex     Itching and breaking out   Pravastatin      Pt stated, "Made my muscle ache"    FAMILY HISTORY:  Family History  Problem Relation Age of Onset   Diabetes Mother    Diabetes Daughter  gestational diabetes   Cancer Father    Thyroid  disease Brother    Other Son        MVA    SOCIAL HISTORY: Social History   Socioeconomic History   Marital status: Legally Separated    Spouse name: Not on file   Number of children: 3   Years of education: Not on file   Highest education level: Not on file  Occupational History   Occupation: Retired  Tobacco Use   Smoking status: Former    Current packs/day: 0.00    Average packs/day: 1 pack/day for 19.7 years (19.7 ttl pk-yrs)    Types: Cigarettes    Start date: 11/21/1966    Quit date: 08/13/1986    Years since quitting: 37.6   Smokeless tobacco: Never  Vaping Use   Vaping status: Never Used  Substance and Sexual Activity   Alcohol use: No    Alcohol/week: 0.0 standard drinks of alcohol   Drug use: No   Sexual activity: Not Currently    Birth control/protection: Post-menopausal, Surgical    Comment: tubal  Other Topics Concern   Not on file  Social History Narrative   No regular exercise   Social Drivers of  Health   Financial Resource Strain: Not on file  Food Insecurity: Not on file  Transportation Needs: Not on file  Physical Activity: Not on file  Stress: Not on file  Social Connections: Not on file    MEDICATIONS:  Current Outpatient Medications  Medication Sig Dispense Refill   aspirin 81 MG chewable tablet Chew 81 mg by mouth daily.     Blood Glucose Monitoring Suppl (ACCU-CHEK GUIDE ME) w/Device KIT Use to check blood sugar 2 times per day dx code E11.65 1 kit 0   cloNIDine  (CATAPRES ) 0.1 MG tablet TAKE (1) TABLET BY MOUTH TWICE DAILY. 180 tablet 1   Continuous Glucose Sensor (DEXCOM G7 SENSOR) MISC Change every 10 days 9 each 4   gentamicin  cream (GARAMYCIN ) 0.1 % Apply to affected area once daily. 30 g 0   gentamicin  cream (GARAMYCIN ) 0.1 % Apply 1 Application topically.     glucose blood (ACCU-CHEK GUIDE) test strip Use as instructed 100 strip 11   HUMALOG  KWIKPEN 100 UNIT/ML KwikPen INJECT 6 TO 7 UNITS SUBCUTANEOUSLY BEFORE SUPPER IF GLUCOSE IS HIGH. 15 mL 0   insulin  degludec (TRESIBA  FLEXTOUCH) 100 UNIT/ML FlexTouch Pen Inject 20 Units into the skin daily. 3 mL 5   Lancets (ONETOUCH ULTRASOFT) lancets      metoprolol  tartrate (LOPRESSOR ) 50 MG tablet TAKE 1/2 TABLET BY MOUTH TWICE DAILY. 90 tablet 0   Multiple Vitamin (MULTIVITAMIN) tablet Take 1 tablet by mouth once a week.     omeprazole (PRILOSEC) 20 MG capsule Take 20 mg by mouth daily.     rosuvastatin  (CRESTOR ) 5 MG tablet Take 1 tablet (5 mg total) by mouth once a week. 12 tablet 1   SURE COMFORT INSULIN  SYRINGE 31G X 5/16" 0.3 ML MISC USE AS DIRECTED 2-3 TIMES A DAY. 100 each 0   tirzepatide  (MOUNJARO ) 5 MG/0.5ML Pen Inject 5 mg into the skin once a week. 6 mL 3   hydrochlorothiazide  (MICROZIDE ) 12.5 MG capsule Take 1 capsule (12.5 mg total) by mouth daily. (Patient not taking: Reported on 03/13/2024) 90 capsule 3   Insulin  Pen Needle (B-D ULTRAFINE III SHORT PEN) 31G X 8 MM MISC USE  2-3 TIMES A DAY. 100 each 4   No  current facility-administered medications for this visit.  PHYSICAL EXAM: Vitals:   03/13/24 0945  BP: 110/62  Pulse: 77  Resp: 20  SpO2: 98%  Weight: 200 lb 3.2 oz (90.8 kg)  Height: 5\' 7"  (1.702 m)    Body mass index is 31.36 kg/m.  Wt Readings from Last 3 Encounters:  03/13/24 200 lb 3.2 oz (90.8 kg)  02/14/24 201 lb 9.6 oz (91.4 kg)  12/07/23 196 lb 9.6 oz (89.2 kg)    General: Well developed, well nourished female in no apparent distress.  HEENT: AT/[place], no external lesions.  Eyes: Conjunctiva clear and no icterus. Neck: Neck supple, thyromegaly ~ 3 X normal  R > L, non tender, mobile. Lungs: Respirations not labored Neurologic: Alert, oriented, normal speech Extremities / Skin: Dry.   Psychiatric: Does not appear depressed or anxious  Diabetic Foot Exam - Simple   Simple Foot Form Diabetic Foot exam was performed with the following findings: Yes 03/13/2024 10:01 AM  Visual Inspection No deformities, no ulcerations, no other skin breakdown bilaterally: Yes Sensation Testing See comments: Yes Pulse Check Posterior Tibialis and Dorsalis pulse intact bilaterally: Yes Comments Monofilament exam mildly decreased on bilateral toes.     LABS Reviewed Lab Results  Component Value Date   HGBA1C 6.5 (A) 03/13/2024   HGBA1C 7.0 (A) 12/07/2023   HGBA1C 7.1 (A) 08/23/2023   Lab Results  Component Value Date   FRUCTOSAMINE 345 (H) 03/30/2020   FRUCTOSAMINE 342 (H) 09/08/2017   FRUCTOSAMINE 344 (H) 06/08/2017   Lab Results  Component Value Date   CHOL 179 12/07/2023   HDL 66 12/07/2023   LDLCALC 93 12/07/2023   TRIG 107 12/07/2023   CHOLHDL 2.7 12/07/2023   Lab Results  Component Value Date   MICRALBCREAT 2 12/07/2023   MICRALBCREAT 1.2 11/02/2022   Lab Results  Component Value Date   CREATININE 1.14 (H) 12/07/2023   Lab Results  Component Value Date   GFR 40.85 (L) 05/03/2023    ASSESSMENT / PLAN  1. Type 2 diabetes mellitus with hyperglycemia,  with long-term current use of insulin  (HCC)   2. Subclinical hyperthyroidism   3. Multinodular goiter    Diabetes Mellitus type 2, complicated by CKD/diabetic neuropathy. - Diabetic status / severity: Fair control  Lab Results  Component Value Date   HGBA1C 6.5 (A) 03/13/2024    - Hemoglobin A1c goal : <7%  - Medications: see below.  No change.  I) continue Tresiba  20 units daily. II) continue Mounjaro  5 mg weekly. III) as needed Humalog  as per sliding scale 4 to 7 units with meals.  - Home glucose testing: Dexcom G7 and check as needed. - Discussed/ Gave Hypoglycemia treatment plan.  # Consult : not required at this time.   # Annual urine for microalbuminuria/ creatinine ratio, no microalbuminuria currently.  Last  Lab Results  Component Value Date   MICRALBCREAT 2 12/07/2023    # Foot check nightly / neuropathy.  Following with podiatry.  # Annual dilated diabetic eye exams.   - Diet: Make healthy diabetic food choices - Life style / activity / exercise: Discussed.  2. Blood pressure  -  BP Readings from Last 1 Encounters:  03/13/24 110/62    - Control is in target.  - No change in current plans.   3. Lipid status / Hyperlipidemia - Last  Lab Results  Component Value Date   LDLCALC 93 12/07/2023   - Continue rosuvastatin  5mg  weekly 10 mg daily.  Managed by cardiology.  # Multinodular goiter /subclinical hyperthyroidism -She  has good response with radiofrequency ablation in May 2024 of dominant right thyroid  nodule measuring 5.7 cm.  She has been following with radiology and ultrasound in July 27, 2023 showed decreasing size of this nodule.  She had thyroid  function test consistent with subclinical hyperthyroidism.  Currently not on thyroid  medication.  She is clinically euthyroid. -Will check thyroid  function test.  Diagnoses and all orders for this visit:  Type 2 diabetes mellitus with hyperglycemia, with long-term current use of insulin  (HCC) -      POCT glycosylated hemoglobin (Hb A1C)  Subclinical hyperthyroidism -     T3, free -     T4, free -     TSH  Multinodular goiter   DISPOSITION Follow up in clinic in 3 months suggested.   All questions answered and patient verbalized understanding of the plan.  Brittney Lovelee Forner, MD [facility] Endocrinology [facility] [address] [place], [address] [place], [place] [address] Phone # [phone]  At least part of this note was generated using voice recognition software. Inadvertent word errors may have occurred, which were not recognized during the proofreading process.

## 2024-03-18 ENCOUNTER — Telehealth: Payer: Self-pay | Admitting: Podiatry

## 2024-03-18 ENCOUNTER — Encounter: Payer: Self-pay | Admitting: Endocrinology

## 2024-03-18 NOTE — Telephone Encounter (Signed)
 LVM on 4/25 @ 5:36 regarding her order. They need to verify do you want them to use the scans/foam from 2/24 or will you be provided new scan/foam for this order.   I LVM @ 5:03 pm to confirm that we got VM, that you was OOF today and will return call at your earliest convenience.

## 2024-03-22 NOTE — Telephone Encounter (Signed)
 Tricia: I sent a message to [facility] to use scans from 2024 for [facility]

## 2024-03-27 ENCOUNTER — Ambulatory Visit

## 2024-03-27 ENCOUNTER — Ambulatory Visit (HOSPITAL_COMMUNITY)
Admission: RE | Admit: 2024-03-27 | Discharge: 2024-03-27 | Disposition: A | Discharge status: Home / Self Care | Source: Ambulatory Visit | Attending: Vascular Surgery | Admitting: Vascular Surgery

## 2024-03-27 ENCOUNTER — Other Ambulatory Visit: Payer: Self-pay | Admitting: Cardiology

## 2024-03-27 VITALS — BP 181/79 | HR 88 | Temp 98.2°F | Wt 203.0 lb

## 2024-03-27 DIAGNOSIS — I739 Peripheral vascular disease, unspecified: Secondary | ICD-10-CM | POA: Diagnosis not present

## 2024-03-27 LAB — VAS US ABI WITH/WO TBI
Left ABI: 0.81
Right ABI: 0.99

## 2024-03-27 NOTE — Progress Notes (Signed)
 HISTORY AND PHYSICAL     CC:  follow up. Requesting Provider:  Wilburn Handler, MD  HPI: This is a 78 y.o. female who is here today for follow up for PAD.  Pt has hx of claudication.   Pt was last seen 05/24/2023 and at that time, she did have some pain in her left hip and buttock that radiated dow the back of her left thigh and occurred at night or when laying flat or leaning over to wash her leg.  This was keeping her up at night.  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  She has hx of IDDM on insulin .    The pt returns today for follow up and here with her daughter.  She states that her legs are doing well. She does get some cramping every now and then but it is not consistent every time she walks.  She does get some cramps at night and she uses mustard, which helps her cramps.  She does not have any non healing wounds on her feet nor does she endorse rest pain.  She takes her statin once a week and asa daily.  She follows with podiatry regularly.  She has a callus on the lateral aspect of the plantar left foot that podiatry takes care of.  She takes good care of her feet.  She does have some swelling in the left foot and this improves after she has been in bed overnight.    She is taking Mounjaro  and she has lost about 40 lbs.  She states she feels better.    The pt is on a statin for cholesterol management.    The pt is  on an aspirin.    Other AC:  none The pt is on diuretic, clonidine , BB for hypertension.  The pt is  on medication for diabetes. Tobacco hx:  former  Pt does not have family hx of AAA.  Past Medical History:  Diagnosis Date   Atrial fibrillation (HCC)    Paroxysmal; LVH; nl EF; onset in 1999   Burning with urination 02/23/2016   Diabetes mellitus    insulin ; managed by Dr. 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   Hyperlipidemia    Hypertension    Hypothyroidism    history of goiter; nl TSH off medication    Itching with irritation 02/27/2015   Obesity 07/06/2012   Palpitations    negative event recorder in 2009   UTI (urinary tract infection) 02/23/2016    Past Surgical History:  Procedure Laterality Date   BIOPSY THYROID      CESAREAN SECTION     CHOLECYSTECTOMY  11/95   COLONOSCOPY  2007   IR RADIOLOGIST EVAL & MGMT  02/10/2023   IR RADIOLOGIST EVAL & MGMT  08/02/2023   RETINAL DETACHMENT SURGERY  2009   TUBAL LIGATION  1980's    Allergies  Allergen Reactions   Latex     Itching and breaking out   Pravastatin      Pt stated, "Made my muscle ache"    Current Outpatient Medications  Medication Sig Dispense Refill   aspirin 81 MG chewable tablet Chew 81 mg by mouth daily.     Blood Glucose Monitoring Suppl (ACCU-CHEK GUIDE ME) w/Device KIT Use to check blood sugar 2 times per day dx code E11.65 1 kit 0   cloNIDine  (CATAPRES ) 0.1 MG tablet TAKE (1) TABLET BY MOUTH TWICE DAILY. 180  tablet 1   Continuous Glucose Sensor (DEXCOM G7 SENSOR) MISC Change every 10 days 9 each 4   gentamicin  cream (GARAMYCIN ) 0.1 % Apply to affected area once daily. 30 g 0   gentamicin  cream (GARAMYCIN ) 0.1 % Apply 1 Application topically.     glucose blood (ACCU-CHEK GUIDE) test strip Use as instructed 100 strip 11   HUMALOG  KWIKPEN 100 UNIT/ML KwikPen INJECT 6 TO 7 UNITS SUBCUTANEOUSLY BEFORE SUPPER IF GLUCOSE IS HIGH. 15 mL 0   hydrochlorothiazide  (MICROZIDE ) 12.5 MG capsule Take 1 capsule (12.5 mg total) by mouth daily. (Patient not taking: Reported on 03/13/2024) 90 capsule 3   insulin  degludec (TRESIBA  FLEXTOUCH) 100 UNIT/ML FlexTouch Pen Inject 20 Units into the skin daily. 3 mL 5   Insulin  Pen Needle (B-D ULTRAFINE III SHORT PEN) 31G X 8 MM MISC USE  2-3 TIMES A DAY. 100 each 4   Lancets (ONETOUCH ULTRASOFT) lancets      metoprolol  tartrate (LOPRESSOR ) 50 MG tablet TAKE 1/2 TABLET BY MOUTH TWICE DAILY. 90 tablet 0   Multiple Vitamin (MULTIVITAMIN) tablet Take 1 tablet by mouth once a week.     omeprazole  (PRILOSEC) 20 MG capsule Take 20 mg by mouth daily.     rosuvastatin  (CRESTOR ) 5 MG tablet Take 1 tablet (5 mg total) by mouth daily. 100 tablet 1   SURE COMFORT INSULIN  SYRINGE 31G X 5/16" 0.3 ML MISC USE AS DIRECTED 2-3 TIMES A DAY. 100 each 0   tirzepatide  (MOUNJARO ) 5 MG/0.5ML Pen Inject 5 mg into the skin once a week. 6 mL 3   No current facility-administered medications for this visit.    Family History  Problem Relation Age of Onset   Diabetes Mother    Diabetes Daughter        gestational diabetes   Cancer Father    Thyroid  disease Brother    Other Son        MVA    Social History   Socioeconomic History   Marital status: Legally Separated    Spouse name: Not on file   Number of children: 3   Years of education: Not on file   Highest education level: Not on file  Occupational History   Occupation: Retired  Tobacco Use   Smoking status: Former    Current packs/day: 0.00    Average packs/day: 1 pack/day for 19.7 years (19.7 ttl pk-yrs)    Types: Cigarettes    Start date: 11/21/1966    Quit date: 08/13/1986    Years since quitting: 37.6   Smokeless tobacco: Never  Vaping Use   Vaping status: Never Used  Substance and Sexual Activity   Alcohol use: No    Alcohol/week: 0.0 standard drinks of alcohol   Drug use: No   Sexual activity: Not Currently    Birth control/protection: Post-menopausal, Surgical    Comment: tubal  Other Topics Concern   Not on file  Social History Narrative   No regular exercise   Social Drivers of Health   Financial Resource Strain: Not on file  Food Insecurity: Not on file  Transportation Needs: Not on file  Physical Activity: Not on file  Stress: Not on file  Social Connections: Not on file  Intimate Partner Violence: Not on file     REVIEW OF SYSTEMS:   [X]  denotes positive finding, [ ]  denotes negative finding Cardiac  Comments:  Chest pain or chest pressure:    Shortness of breath upon exertion:    Short of breath  when  lying flat:    Irregular heart rhythm:        Vascular    Pain in calf, thigh, or hip brought on by ambulation: x See HPI  Pain in feet at night that wakes you up from your sleep:     Blood clot in your veins:    Leg swelling:  x       Pulmonary    Oxygen at home:    Productive cough:     Wheezing:         Neurologic    Sudden weakness in arms or legs:     Sudden numbness in arms or legs:     Sudden onset of difficulty speaking or slurred speech:    Temporary loss of vision in one eye:     Problems with dizziness:         Gastrointestinal    Blood in stool:     Vomited blood:         Genitourinary    Burning when urinating:     Blood in urine:        Psychiatric    Major depression:         Hematologic    Bleeding problems:    Problems with blood clotting too easily:        Skin    Rashes or ulcers:        Constitutional    Fever or chills:      PHYSICAL EXAMINATION:  Today's Vitals   03/27/24 1059  BP: (!) 181/79  Pulse: 88  Temp: 98.2 F (36.8 C)  TempSrc: Temporal  SpO2: 98%  Weight: 203 lb (92.1 kg)  PainSc: 0-No pain   Body mass index is 31.79 kg/m.   General:  WDWN in NAD; vital signs documented above Gait: Not observed HENT: WNL, normocephalic Pulmonary: normal non-labored breathing , without wheezing Cardiac: regular HR, without carotid bruits Abdomen: soft, NT; aortic pulse is not palpable Skin: without rashes Vascular Exam/Pulses:  Right Left  Radial 2+ (normal) 2+ (normal)  Femoral 2+ (normal) 2+ (normal)  Popliteal Unable to palpate Unable to palpate  DP 2+ (normal) Unable to palpate  PT Unable to palpate Unable to palpate   Extremities: without ischemic changes, without Gangrene , without cellulitis; without open wounds Musculoskeletal: no muscle wasting or atrophy  Neurologic: A&O X 3 Psychiatric:  The pt has Normal affect.   Non-Invasive Vascular Imaging:   ABI's/TBI's on 03/27/2024: Right:  0.99/0.82 - Great toe  pressure: 140 Left:  0.81/0.52 - Great toe pressure: 89   Previous ABI's/TBI's on 05/24/2023: Right:  0.92/0.62 - Great toe pressure: 100 Left:  0.76/0.38 - Great toe pressure:  60     ASSESSMENT/PLAN:: 78 y.o. female here for follow up for PAD with hx of claudication   -pt doing quite well.  She has a palpable right DP pulse.  She does not have rest pain or non healing wounds.  She takes excellent care of her feet and is followed by podiatry.  She does get some cramping intermittently with walking but is not consistent.  I encouraged her to continue walking program  -continue asa/statin -pt will f/u in one year with ABI.  She will call sooner if any issues with non healing ulcers or rest pain.     Maryanna Smart, [facility] [phone]  Clinic MD:   Vikki Graves

## 2024-03-29 ENCOUNTER — Other Ambulatory Visit: Payer: Self-pay | Admitting: Interventional Radiology

## 2024-03-29 DIAGNOSIS — E041 Nontoxic single thyroid nodule: Secondary | ICD-10-CM

## 2024-04-05 ENCOUNTER — Telehealth: Payer: Self-pay

## 2024-04-05 NOTE — Telephone Encounter (Signed)
 Diabetic shoes are here/paperwork expires 06/06/24

## 2024-04-05 NOTE — Telephone Encounter (Signed)
 LVM to schedule DM shoe PU

## 2024-04-08 ENCOUNTER — Telehealth: Payer: Self-pay | Admitting: Podiatry

## 2024-04-08 NOTE — Telephone Encounter (Signed)
 1st available appt is 6/24. Pt is going out of town on 6/20. She would like to know if she can pick up shores before she leaves for the trip

## 2024-04-09 DIAGNOSIS — I10 Essential (primary) hypertension: Secondary | ICD-10-CM | POA: Diagnosis not present

## 2024-04-09 DIAGNOSIS — E042 Nontoxic multinodular goiter: Secondary | ICD-10-CM | POA: Diagnosis not present

## 2024-04-09 DIAGNOSIS — E782 Mixed hyperlipidemia: Secondary | ICD-10-CM | POA: Diagnosis not present

## 2024-04-09 DIAGNOSIS — I129 Hypertensive chronic kidney disease with stage 1 through stage 4 chronic kidney disease, or unspecified chronic kidney disease: Secondary | ICD-10-CM | POA: Diagnosis not present

## 2024-04-09 DIAGNOSIS — N1831 Chronic kidney disease, stage 3a: Secondary | ICD-10-CM | POA: Diagnosis not present

## 2024-04-09 DIAGNOSIS — Z794 Long term (current) use of insulin: Secondary | ICD-10-CM | POA: Diagnosis not present

## 2024-04-09 DIAGNOSIS — E1142 Type 2 diabetes mellitus with diabetic polyneuropathy: Secondary | ICD-10-CM | POA: Diagnosis not present

## 2024-04-09 DIAGNOSIS — E1122 Type 2 diabetes mellitus with diabetic chronic kidney disease: Secondary | ICD-10-CM | POA: Diagnosis not present

## 2024-04-09 NOTE — Telephone Encounter (Signed)
 Called Pt to offer a cancellation. She need to confirm w/ daughter. I tried calling daughter 2x's and it went straight to VM. Pt tried to [facility] and she did not answer. Explained that cancellations usual rolls over to the next Pt on the list. She then said she will just have to wait until she get back from vacation.

## 2024-04-11 ENCOUNTER — Other Ambulatory Visit

## 2024-04-11 ENCOUNTER — Ambulatory Visit (HOSPITAL_COMMUNITY)
Admission: RE | Admit: 2024-04-11 | Discharge: 2024-04-11 | Disposition: A | Discharge status: Home / Self Care | Source: Ambulatory Visit | Attending: Interventional Radiology | Admitting: Interventional Radiology

## 2024-04-11 DIAGNOSIS — E042 Nontoxic multinodular goiter: Secondary | ICD-10-CM | POA: Diagnosis not present

## 2024-04-11 DIAGNOSIS — E041 Nontoxic single thyroid nodule: Secondary | ICD-10-CM | POA: Diagnosis not present

## 2024-04-15 NOTE — Progress Notes (Signed)
 Referring Physician(s): Lajean Pike   Chief Complaint: The patient is seen in virtual video follow up today s/p thermal ablation of the right superior thyroid  nodule 04/06/23  History of present illness: HPI from initial consultation 02/10/23 Brittney [name] is a 78 y.o. female with history of multinodular goiter. She has remained euthyroid with laboratory subclinical hyperthyroidism currently.  She has a history of palpitations, likely attributed to her thyroid , which are under control with metoprolol  25 mg QD. She reports her thyroid  has been enlarged for 20 years. In 2003, she had a negative FNA on a nodule in the left thyroid . She denies any compressive symptoms such as dysphagia, voice changes, or dyspnea. The right side of her thyroid  bothers her more than the left. She has met with Dr. Sofia Dunn to consider thyroidectomy, however more minimally invasive options are being considered given her age and comorbidities.   Thyroid  Symptom Score: 8/10   Thyroid  Cosmetic Score: Readily detected cosmetic problem (Grade 4).   She expressed interest in pursuing thyroid  RFA. We discussed first focusing on the dominant right sided lesion, given her symptoms correlate predominately with the right side, then we could address other nodules in the future depending on her symptoms. Since she had not had a right nodule biopsy, we needed 2 negative biopsies prior to being able to proceed with RFA. These were performed 02/24/23 and 03/13/23. She then underwent a successful thermal ablation of the right superior thyroid  nodule 04/06/23.  She followed up with me 08/02/23 and reported feeling very well. She had no new symptoms and stated the size of her thyroid  had significantly decreased. We discussed obtaining a repeat ultrasound in 6 months followed by a clinic visit. Her US  study was performed 04/11/24 and she presents today via virtual video visit for follow up. She recently followed up with her endocrinologist  03/13/24. She continues to do well.  She does notice some bulging in her right neck that flucutates.  She is now on Mounjaro  and states that has some effect when she takes it, making her thyroid  swell.  No significant pain or dysphagia.     Past Medical History:  Diagnosis Date   Atrial fibrillation (HCC)    Paroxysmal; LVH; nl EF; onset in 1999   Burning with urination 02/23/2016   Diabetes mellitus    insulin ; managed by Dr. Hubert Madden   Diabetic Charcot's joint disease [facility]    left lower extremity   Hyperlipidemia    Hypertension    Hypothyroidism    history of goiter; nl TSH off medication   Itching with irritation 02/27/2015   Obesity 07/06/2012   Palpitations    negative event recorder in 2009   UTI (urinary tract infection) 02/23/2016    Past Surgical History:  Procedure Laterality Date   BIOPSY THYROID      CESAREAN SECTION     CHOLECYSTECTOMY  11/95   COLONOSCOPY  2007   IR RADIOLOGIST EVAL & MGMT  02/10/2023   IR RADIOLOGIST EVAL & MGMT  08/02/2023   RETINAL DETACHMENT SURGERY  2009   TUBAL LIGATION  1980's    Allergies: Latex and Pravastatin   Medications: Prior to Admission medications   Medication Sig Start Date End Date Taking? Authorizing Provider  aspirin 81 MG chewable tablet Chew 81 mg by mouth daily.    [provider]  Blood Glucose Monitoring Suppl (ACCU-CHEK GUIDE ME) w/Device KIT Use to check blood sugar 2 times per day dx code E11.65 02/24/21  Lajean Pike, MD  cloNIDine  (CATAPRES ) 0.1 MG tablet TAKE (1) TABLET BY MOUTH TWICE DAILY. 12/06/23   Thapa, [place], MD  Continuous Glucose Sensor (DEXCOM G7 SENSOR) MISC Change every 10 days 02/27/24   Thapa, [place], MD  gentamicin  cream (GARAMYCIN ) 0.1 % Apply to affected area once daily. 05/09/23   Luella Sager, DPM  gentamicin  cream (GARAMYCIN ) 0.1 % Apply 1 Application topically. 05/09/23   [provider]  glucose blood (ACCU-CHEK GUIDE) test strip Use as instructed 08/07/23   Thapa, [place], MD   HUMALOG  KWIKPEN 100 UNIT/ML KwikPen INJECT 6 TO 7 UNITS SUBCUTANEOUSLY BEFORE SUPPER IF GLUCOSE IS HIGH. 11/01/22   Lajean Pike, MD  hydrochlorothiazide  (MICROZIDE ) 12.5 MG capsule Take 1 capsule (12.5 mg total) by mouth daily. Patient not taking: Reported on 03/13/2024 02/09/24   Thapa, [place], MD  insulin  degludec (TRESIBA  FLEXTOUCH) 100 UNIT/ML FlexTouch Pen Inject 20 Units into the skin daily. 08/16/23   Thapa, [place], MD  Insulin  Pen Needle (B-D ULTRAFINE III SHORT PEN) 31G X 8 MM MISC USE  2-3 TIMES A DAY. 03/13/24   Thapa, [place], MD  Lancets (ONETOUCH ULTRASOFT) lancets  04/29/13   [provider]  metoprolol  tartrate (LOPRESSOR ) 50 MG tablet TAKE 1/2 TABLET BY MOUTH TWICE DAILY. 12/25/23   Laurann Pollock, MD  Multiple Vitamin (MULTIVITAMIN) tablet Take 1 tablet by mouth once a week.    [provider]  omeprazole (PRILOSEC) 20 MG capsule Take 20 mg by mouth daily.    [provider]  rosuvastatin  (CRESTOR ) 5 MG tablet Take 1 tablet (5 mg total) by mouth daily. 03/27/24   Laurann Pollock, MD  SURE COMFORT INSULIN  SYRINGE 31G X 5/16" 0.3 ML MISC USE AS DIRECTED 2-3 TIMES A DAY. 01/27/20   Lajean Pike, MD  tirzepatide  (MOUNJARO ) 5 MG/0.5ML Pen Inject 5 mg into the skin once a week. 01/30/24   Thapa, [place], MD     Family History  Problem Relation Age of Onset   Diabetes Mother    Diabetes Daughter        gestational diabetes   Cancer Father    Thyroid  disease Brother    Other Son        MVA    Social History   Socioeconomic History   Marital status: Legally Separated    Spouse name: Not on file   Number of children: 3   Years of education: Not on file   Highest education level: Not on file  Occupational History   Occupation: Retired  Tobacco Use   Smoking status: Former    Current packs/day: 0.00    Average packs/day: 1 pack/day for 19.7 years (19.7 ttl pk-yrs)    Types: Cigarettes    Start date: 11/21/1966    Quit date: 08/13/1986    Years since  quitting: 37.6   Smokeless tobacco: Never  Vaping Use   Vaping status: Never Used  Substance and Sexual Activity   Alcohol use: No    Alcohol/week: 0.0 standard drinks of alcohol   Drug use: No   Sexual activity: Not Currently    Birth control/protection: Post-menopausal, Surgical    Comment: tubal  Other Topics Concern   Not on file  Social History Narrative   No regular exercise   Social Drivers of Health   Financial Resource Strain: Not on file  Food Insecurity: Not on file  Transportation Needs: Not on file  Physical Activity: Not on file  Stress: Not on file  Social Connections: Not  on file     Vital Signs: There were no vitals taken for this visit.  Physical Exam  Patient is alert, oriented and able to participate fully in the conversation. No apparent discomfort or distress observed. She appears appropriately dressed. Prominent right thyroid .   Imaging:  NM Thyroid  Uptake (I-123) (11/18/22) IMPRESSION: Multinodular thyroid  gland with cold nodules at inferior RIGHT lobe and probably superior LEFT lobe; ultrasound evaluation recommended to exclude malignancy. Normal 4 hour and 24 hour radio iodine uptakes.    US  Thyroid  (01/17/23)  R superior nodule 5.5 x 3.6 x 5.7 cm = 59 cc   US  Thyroid  07/27/23  4.0 x 4.1 x 3.0 = 24.6 cc (~56% volume reduction)  US  Thyroid  04/11/24  4.8 x 3.1 x 4.6 cm = 34 cc (~39% volume increase from comparison, -39% reduction from prior to ablation)  Labs:  CBC: No results for input(s): "WBC", "HGB", "HCT", "PLT" in the last 8760 hours.  COAGS: No results for input(s): "INR", "APTT" in the last 8760 hours.  BMP: Recent Labs    05/03/23 1023 12/07/23 1008  NA 138 138  K 5.1 5.2  CL 103 102  CO2 28 30  GLUCOSE 181* 134*  BUN 21 22  CALCIUM  9.4 9.7  CREATININE 1.28* 1.14*    LIVER FUNCTION TESTS: No results for input(s): "BILITOT", "AST", "ALT", "ALKPHOS", "PROT", "ALBUMIN" in the last 8760 hours.  TFTs (01/31/23  --> 05/03/23) Serum TSH <0.01  --> 0.00 Serum free T4 1.27 --> 3.4 Serum T3 3.2 --> 1.41 Thyroperoxidase Antibody - not obtained Thyroglobulin Antibody - not obtained Calcitonin - not obtained   Assessment and Plan: 78 year old female with history of multinodular goiter and subclinical hyperthyroidism, possibly from autonomous production. She is now status post thermal ablation of the right superior thyroid  nodule in May of 2024. The nodule demonstrates mild interval enlargement since last visit in September 2024, though still nearly 40% reduced since prior to treatment.  She has a prominent right neck still, related to the thyroid  nodule.  She is not interested in repeating ablation at this point, but if the nodule begins to grow significantly, she will consider.    Follow up in 6 months with thyroid  ultrasound.   Creasie Doctor, MD Pager: [phone]    I spent a total of 25 Minutes in virtual video clinical consultation, greater than 50% of which was counseling/coordinating care for multinodular goiter.

## 2024-04-17 ENCOUNTER — Ambulatory Visit
Admission: RE | Admit: 2024-04-17 | Discharge: 2024-04-17 | Disposition: A | Discharge status: Home / Self Care | Source: Ambulatory Visit | Attending: Interventional Radiology

## 2024-04-17 DIAGNOSIS — E042 Nontoxic multinodular goiter: Secondary | ICD-10-CM | POA: Diagnosis not present

## 2024-04-17 DIAGNOSIS — E041 Nontoxic single thyroid nodule: Secondary | ICD-10-CM

## 2024-04-17 HISTORY — PX: IR RADIOLOGIST EVAL & MGMT: IMG5224

## 2024-05-14 ENCOUNTER — Encounter: Payer: Self-pay | Admitting: Podiatry

## 2024-05-14 ENCOUNTER — Ambulatory Visit (INDEPENDENT_AMBULATORY_CARE_PROVIDER_SITE_OTHER): Payer: 59 | Admitting: Podiatry

## 2024-05-14 ENCOUNTER — Other Ambulatory Visit

## 2024-05-14 DIAGNOSIS — M79675 Pain in left toe(s): Secondary | ICD-10-CM | POA: Diagnosis not present

## 2024-05-14 DIAGNOSIS — E1142 Type 2 diabetes mellitus with diabetic polyneuropathy: Secondary | ICD-10-CM

## 2024-05-14 DIAGNOSIS — B351 Tinea unguium: Secondary | ICD-10-CM

## 2024-05-14 DIAGNOSIS — L84 Corns and callosities: Secondary | ICD-10-CM | POA: Diagnosis not present

## 2024-05-14 DIAGNOSIS — M79674 Pain in right toe(s): Secondary | ICD-10-CM

## 2024-05-14 NOTE — Progress Notes (Signed)
 Subjective:  Patient ID: Brittney [name], female    DOB: 1946/10/21,  MRN: [id number]  78 y.o. female presents at risk footcare. Patient has h/o diabetes, neuropathy and PAD and is seen for  and callus(es) b/l lower extremities left >right and painful thick toenails that are difficult to trim. Painful toenails interfere with ambulation. Aggravating factors include wearing enclosed shoe gear. Pain is relieved with periodic professional debridement. Painful calluses are aggravated when weightbearing with and without shoegear. Pain is relieved with periodic professional debridement.  Chief Complaint  Patient presents with   Diabetes    Cut my toenails.  Saw Dr. [place] Thapa - 03/13/2024; A1c - 6.5   Patient has new complaint of pain on the dorsal aspect of the midfoot area left foot for the past two weeks. She has not had any episode of trauma that she can recall. States she went to the beach last week with her family which she enjoyed.  PCP is Leigh Lung, MD , and last visit was November 17, 2023.  Allergies  Allergen Reactions   Latex     Itching and breaking out   Pravastatin      Pt stated, Made my muscle ache    Review of Systems: Negative except as noted in the HPI.   Objective:  Brittney [name] is a pleasant 78 y.o. female in NAD. AAO x 3.  Vascular Examination: Vascular status intact b/l with palpable pedal pulses. CFT immediate b/l. Pedal hair present. No edema. No pain with calf compression b/l. Skin temperature gradient WNL b/l. No varicosities noted. No cyanosis or clubbing noted.  Neurological Examination: Sensation grossly intact b/l with 10 gram monofilament. Vibratory sensation intact b/l.  Dermatological Examination: Well healed surgical scar dorsal 5th metatarsal left foot. Pedal skin with normal turgor, texture and tone b/l. No open wounds nor interdigital macerations noted. Toenails 3, 4 right, 2-5 left b/l thick, discolored, elongated with subungual debris and  pain on dorsal palpation.   Hyperkeratotic lesion(s) sub 5th met base left foot and sub 5th met base right foot.  No erythema, no edema, no drainage, no fluctuance.  Musculoskeletal Examination: Muscle strength 5/5 to all LE muscle groups of right foot. Muscle strength 4/5to all LE muscle groups of left foot. Charcot deformity noted left foot.   Radiographs: None  Last A1c:      Latest Ref Rng & Units 03/13/2024    9:48 AM 12/07/2023    9:37 AM 08/23/2023   10:54 AM  Hemoglobin A1C  Hemoglobin-A1c 4.0 - 5.6 % 6.5  7.0  7.1    Assessment:   1. Pain due to onychomycosis of toenails of both feet   2. Callus   3. Diabetic polyneuropathy associated with type 2 diabetes mellitus (HCC)    Plan:  Consent given for treatment. Patient examined. All patient's and/or POA's questions/concerns addressed on today's visit.Toenails 1-5 debrided in length and girth without incident. Callus(es) sub 5th met base of both feet pared with sharp debridement without incident. Continue soft, supportive shoe gear daily. Report any pedal injuries to medical professional. Call office if there are any questions/concerns. -Patient/POA to call should there be question/concern in the interim.  Return in about 3 months (around 08/14/2024).  Delon LITTIE Merlin, DPM      [place] LOCATION: [address]  [place], [place] [address]                   Office [phone]   [facility] LOCATION: [address] [place], [place] [address] Office [phone]

## 2024-05-20 ENCOUNTER — Ambulatory Visit (INDEPENDENT_AMBULATORY_CARE_PROVIDER_SITE_OTHER)

## 2024-05-20 DIAGNOSIS — L84 Corns and callosities: Secondary | ICD-10-CM | POA: Diagnosis not present

## 2024-05-20 DIAGNOSIS — M2141 Flat foot [pes planus] (acquired), right foot: Secondary | ICD-10-CM | POA: Diagnosis not present

## 2024-05-20 DIAGNOSIS — M2142 Flat foot [pes planus] (acquired), left foot: Secondary | ICD-10-CM

## 2024-05-20 DIAGNOSIS — E11610 Type 2 diabetes mellitus with diabetic neuropathic arthropathy: Secondary | ICD-10-CM | POA: Diagnosis not present

## 2024-05-20 DIAGNOSIS — E1142 Type 2 diabetes mellitus with diabetic polyneuropathy: Secondary | ICD-10-CM | POA: Diagnosis not present

## 2024-05-20 NOTE — Progress Notes (Signed)

## 2024-05-21 ENCOUNTER — Ambulatory Visit: Admitting: Podiatry

## 2024-06-06 ENCOUNTER — Ambulatory Visit: Admitting: Adult Health

## 2024-06-06 ENCOUNTER — Telehealth: Payer: Self-pay | Admitting: Podiatry

## 2024-06-06 ENCOUNTER — Encounter: Payer: Self-pay | Admitting: Adult Health

## 2024-06-06 VITALS — BP 116/73 | HR 77 | Ht 67.0 in | Wt 203.5 lb

## 2024-06-06 DIAGNOSIS — M533 Sacrococcygeal disorders, not elsewhere classified: Secondary | ICD-10-CM | POA: Diagnosis not present

## 2024-06-06 NOTE — Progress Notes (Signed)
  Subjective:     Patient ID: Brittney [name], female   DOB: January 28, 1946, 78 y.o.   MRN: [id number]  HPI Brittney [name] is a 78 year old black female,separated, PM in complaining of pain at tailbone, sat on hard pew at church and noticed, also hurts some when laying in bed, and she says her right breast feels heavier than left.  PCP is Dr Leigh  Review of Systems + pain at tailbone, sat on hard pew at church and noticed, also hurts some when laying in bed, (had ?cyst years ago) +Right breast feels heavier than left.   Reviewed past medical,surgical, social and family history. Reviewed medications and allergies.  Objective:   Physical Exam BP 116/73 (BP Location: Right Arm, Patient Position: Sitting, Cuff Size: Normal)   Pulse 77   Ht 5' 7 (1.702 m)   Wt 203 lb 8 oz (92.3 kg)   BMI 31.87 kg/m      Skin warm and dry,  Breasts:no dominate palpable mass, retraction or nipple discharge, right breast is larger than left. Had negative mammogram October 14,2024 Has tenderness over tail bone, it is more prominent since losing weight, no cyst noticed  Fall risk is low  [facility] - 06/06/24 9077       Pregnancy Intention Screening   Does the patient want to become pregnant in the next year? N/A    Does the patient's partner want to become pregnant in the next year? N/A    Would the patient like to discuss contraceptive options today? N/A      Contraception Wrap Up   Current Method Female Sterilization   PM   End Method Female Sterilization   PM   Contraception Counseling Provided No          Assessment:     1. Tail bone pain (Primary) Has pain over tail bone,+ tenderness over tail bone, it is more prominent since losing weight, no cyst noticed    Get donut pillow Sleep with  pillow between knees and try heating pad and biofreeze, can take tylenol  prn  Let me know if not better  Plan:     Follow up prn

## 2024-06-06 NOTE — Telephone Encounter (Signed)
 Patient called asking for Handicap tag.

## 2024-06-11 ENCOUNTER — Ambulatory Visit (INDEPENDENT_AMBULATORY_CARE_PROVIDER_SITE_OTHER): Admitting: Podiatry

## 2024-06-11 ENCOUNTER — Ambulatory Visit

## 2024-06-11 ENCOUNTER — Ambulatory Visit (INDEPENDENT_AMBULATORY_CARE_PROVIDER_SITE_OTHER)

## 2024-06-11 VITALS — Ht 67.0 in | Wt 203.5 lb

## 2024-06-11 DIAGNOSIS — M79671 Pain in right foot: Secondary | ICD-10-CM | POA: Diagnosis not present

## 2024-06-11 DIAGNOSIS — E11610 Type 2 diabetes mellitus with diabetic neuropathic arthropathy: Secondary | ICD-10-CM | POA: Diagnosis not present

## 2024-06-11 DIAGNOSIS — M7752 Other enthesopathy of left foot: Secondary | ICD-10-CM

## 2024-06-11 DIAGNOSIS — M19071 Primary osteoarthritis, right ankle and foot: Secondary | ICD-10-CM

## 2024-06-11 MED ORDER — DICLOFENAC SODIUM 1 % EX GEL
4.0000 g | Freq: Four times a day (QID) | CUTANEOUS | 4 refills | Status: AC
Start: 2024-06-11 — End: ?

## 2024-06-11 NOTE — Progress Notes (Signed)
 Subjective:  Patient ID: Brittney [name], female    DOB: November 25, 1945,  MRN: [id number]  Chief Complaint  Patient presents with   Foot Pain    RM 2 Patient is here for bilateral foot pain. Left foot pain is located I the ankle and the top of the foot. Right foot pain is located on the lateral side. Patient is concerned about left ankle swelling.    Discussed the use of AI scribe software for clinical note transcription with the patient, who gave verbal consent to proceed.  History of Present Illness Brittney [name] is a 78 year old female with Charcot arthropathy who presents with left foot pain and swelling.  She has persistent pain and swelling in her left foot, particularly around the bone, without any recent injury. The condition was previously diagnosed as Charcot arthropathy, and she has been managed with a boot in the past. She notes that the bone appears to have slipped, but there has been no significant change in alignment over the years.  In addition to the left foot, she experiences pain in her right foot, which she attributes to neuropathy. The pain is similar to that in her left foot but less severe. She reports occasional numbness. She has been informed of arthritis in this foot, but not Charcot arthropathy.  She has not undergone surgery for her foot issues and has not tried Voltaren  gel.      Objective:    Physical Exam VASCULAR: DP and PT pulse palpable. Foot is warm and well-perfused. Capillary fill time is brisk. DERMATOLOGIC: Normal skin turgor, texture, and temperature. No open lesions, rashes, or ulcerations. NEUROLOGIC: Normal sensation to light touch and pressure. No paresthesias. ORTHOPEDIC: Limited range of motion of the subtalar and ankle joint on the left foot. Edema around the left ankle, no heat or erythema. Mild tenderness over the dorsal midfoot of the right foot. No ecchymosis or bruising.    No images are attached to the encounter.     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 Arthritis of right midfoot   2. Diabetic Charcot's joint disease (HCC)      Plan:  Patient was evaluated and treated and all questions answered.  Assessment and Plan Assessment & Plan Charcot arthropathy of left foot Chronic Charcot arthropathy of the left foot with no significant change in alignment since 2021.  Foot remains plantigrade and the patient is ulcer free.  Radiographs show Charcot arthropathy of the midfoot. Pain and swelling are likely due to arthritis. Current A1c levels are well-controlled, reducing the risk of ulceration. - Refer to [facility] for fitting of a double upright brace to offload the joint and alleviate pain.  Do not think she would be a good candidate for a [facility] AFO - Provide prescription for the brace with contact information for [facility]. - Advise monitoring for any signs of bleeding, blistering, or bruising on the foot, which may indicate further collapse of the arch.  Osteoarthritis of right foot Osteoarthritis of the right foot with mild tenderness over the dorsal midfoot. Pain is likely exacerbated by neuropathy and new footwear. The condition is manageable and does not require surgical intervention. - Recommend Tylenol  for pain management. - Prescribe Voltaren  gel for topical application to alleviate arthritis pain. - Send prescription for Voltaren  gel to pharmacy and advise on over-the-counter availability if  not covered by insurance.      No follow-ups on file.

## 2024-06-18 ENCOUNTER — Other Ambulatory Visit: Payer: Self-pay | Admitting: Cardiology

## 2024-07-08 DIAGNOSIS — I129 Hypertensive chronic kidney disease with stage 1 through stage 4 chronic kidney disease, or unspecified chronic kidney disease: Secondary | ICD-10-CM | POA: Diagnosis not present

## 2024-07-08 DIAGNOSIS — E1142 Type 2 diabetes mellitus with diabetic polyneuropathy: Secondary | ICD-10-CM | POA: Diagnosis not present

## 2024-07-08 DIAGNOSIS — E1122 Type 2 diabetes mellitus with diabetic chronic kidney disease: Secondary | ICD-10-CM | POA: Diagnosis not present

## 2024-07-08 DIAGNOSIS — E782 Mixed hyperlipidemia: Secondary | ICD-10-CM | POA: Diagnosis not present

## 2024-07-08 DIAGNOSIS — N1831 Chronic kidney disease, stage 3a: Secondary | ICD-10-CM | POA: Diagnosis not present

## 2024-07-08 DIAGNOSIS — E042 Nontoxic multinodular goiter: Secondary | ICD-10-CM | POA: Diagnosis not present

## 2024-07-15 ENCOUNTER — Encounter: Payer: Self-pay | Admitting: Endocrinology

## 2024-07-15 ENCOUNTER — Ambulatory Visit (INDEPENDENT_AMBULATORY_CARE_PROVIDER_SITE_OTHER): Admitting: Endocrinology

## 2024-07-15 VITALS — BP 132/70 | HR 90 | Resp 20 | Ht 67.0 in | Wt 204.4 lb

## 2024-07-15 DIAGNOSIS — Z794 Long term (current) use of insulin: Secondary | ICD-10-CM

## 2024-07-15 DIAGNOSIS — E1169 Type 2 diabetes mellitus with other specified complication: Secondary | ICD-10-CM

## 2024-07-15 DIAGNOSIS — E042 Nontoxic multinodular goiter: Secondary | ICD-10-CM

## 2024-07-15 DIAGNOSIS — E0590 Thyrotoxicosis, unspecified without thyrotoxic crisis or storm: Secondary | ICD-10-CM

## 2024-07-15 LAB — T4, FREE: Free T4: 1.4 ng/dL (ref 0.8–1.8)

## 2024-07-15 LAB — TSH: TSH: 0.01 m[IU]/L — ABNORMAL LOW (ref 0.40–4.50)

## 2024-07-15 LAB — T3, FREE: T3, Free: 3.3 pg/mL (ref 2.3–4.2)

## 2024-07-15 MED ORDER — TIRZEPATIDE 7.5 MG/0.5ML ~~LOC~~ SOAJ
7.5000 mg | SUBCUTANEOUS | 4 refills | Status: AC
Start: 2024-07-15 — End: ?

## 2024-07-15 MED ORDER — TRESIBA FLEXTOUCH 100 UNIT/ML ~~LOC~~ SOPN
18.0000 [IU] | PEN_INJECTOR | Freq: Every day | SUBCUTANEOUS | 5 refills | Status: AC
Start: 2024-07-15 — End: ?

## 2024-07-15 NOTE — Progress Notes (Signed)
 Outpatient Endocrinology Note [place] Damon Baisch, MD  07/15/24  Patient's Name: Brittney [name]    DOB: 1946/06/18    MRN: [id number]                                                    REASON OF VISIT: Follow up for type 2 diabetes mellitus /multinodular goiter  PCP: Leigh Lung, MD  HISTORY OF PRESENT ILLNESS:   Brittney [name] is a 78 y.o. old female with past medical history listed below, is here for follow up of type 2 diabetes mellitus / multinodular goiter.   Pertinent Diabetes History: Patient was diagnosed with type 2 diabetes mellitus in 1976.  She has been on insulin  for several years.  Chronic Diabetes Complications : Retinopathy: no. Last ophthalmology exam was done on annually, reportedly. Nephropathy: CKD Peripheral neuropathy: yes, following with podiatry.  She has numbness of the toes.  She has been using diabetic shoes. Coronary artery disease: no Stroke: no  Relevant comorbidities and cardiovascular risk factors: Obesity: yes Body mass index is 32.01 kg/m.  Hypertension: yes Hyperlipidemia.  The lipid abnormality consists of elevated LDL; she stopped her pravastatin  because of symptoms of muscle aches and cramping She was taking simvastatin  2/7 days a week without side effects but now she is not taking it claiming that it may have Caused muscle aches with regular administration.  She is on Crestor  5 mg every week, managed by cardiology.  Current / Home Diabetic regimen includes: Tresiba  20 units in the morning. Humalog  as needed for glucose > 200 as per sliding scale 4 to 7 units.  Has not been requiring lately. Mounjaro  5 mg weekly.   Prior diabetic medications: She used to be on metformin  was later stopped unclear reason. Invokana  ?  Headache, Victoza  ?  Swelling of the neck.  B Byetta .  Lantus .  Glycemic data:    CONTINUOUS GLUCOSE MONITORING SYSTEM (CGMS) INTERPRETATION: At today's visit, we reviewed CGM downloads. The full report is scanned in the  media. Reviewing the CGM trends, blood glucose are as follows:  Dexcom G7 CGM-  Sensor Download (Sensor download was reviewed and summarized below.) Dates: August 12 to August 25 , 2025, 14 days  Glucose Management Indicator: 7.0% Sensor usage : 93%       Interpretation: Mostly acceptable blood sugar.  She has random mild hyperglycemia with blood sugar in the 200s postprandially.  Blood sugars overnight and in between the meals are acceptable.  No hypoglycemia.  Hypoglycemia: Patient has no hypoglycemic episodes. Patient has hypoglycemia awareness.  Factors modifying glucose control: 1.  Diabetic diet assessment: 2-3 meals a day.  2.  Staying active or exercising: Walking  3.  Medication compliance: compliant all of the time.  # Multinodular goiter -Patient has longstanding history of multinodular goiter since at least 2003.  She has occasional palpitation for years.  Toprol  was prescribed by cardiologist.  Because of large size of goiter she was interested in getting surgery done since her I-131 uptake was not contributive to I-131 treatment.  She was hesitant about surgery and was referred to radiofrequency ablation. -She had radiofrequency ablation done on her right dominant thyroid  nodule in May 2024 which was measuring 5.7 cm, this nodule size has been decreasing.  Her goiter has been autonomous with no evidence of overt hyperthyroidism with normal free  T4 and free T3 and TSH has been consistently suppressed.  RAI uptake and scan in December 2024 showed homogeneous tracer distribution in both thyroid  lobe.  Dominant cold nodule inferior right lobe and probable nodule upper pole left lobe.  Normal 4-hour and 24-hour radioiodine uptakes.  US  thyroid  : 01/17/2023: Moderately heterogeneous.  There was 5.7 x 5.5 x 3.6 cm isoechoic ill-defined nodule in the upper pole of right lobe of thyroid , grossly unchanged compared to ultrasound in January 2014, previously 2.2 cm.  Approximately  3.9 isoechoic ill-defined nodules/pseudonodule within the mid aspect of the right lobe of the thyroid , unchanged from 2017.  Approximately 3.7 isoechoic ill-defined nodule/lesion noted within the mid aspect of the right thyroid  lobe of thyroid  unchanged.  Approximately 2.7 cm hypoechoic ill-defined nodule inferior pole of right lobe unchanged.  Questionable 1.5 cm isoechoic ill-defined nodule on the left superior pole and 2.1 cm isoechoic ill-defined nodule in the mid aspect of the left thyroid  lobe unchanged.  Other left thyroid  nodule measuring 2.0 cm and 3.2 cm ill-defined.  Overall similar finding of thyromegaly and multinodular goiter without discrete worrisome new or enlarging thyroid  nodules.  Overall unchanged compared to 2014.  -On April 2024 patient had FNA of right ill-defined superior pole isoechoic nodule measuring 5.7 cm 2 times with benign cytology consistent with benign follicular nodule.  -Ultrasound thyroid  in September 2024 :IMPRESSION: Decreased size of right superior thyroid  nodule status post ablation, now 4.1 cm.  -Ultrasound thyroid  in May 2025 showed a stable previously ablated nodule measuring 4.9 cm.  Interval history Patient reports she had lab work with primary care provider last week, no records available to review reports A1c was 7%.  CGM data as reviewed above, patient postprandial hyperglycemia mild.  Diabetes regimen as reviewed and noted above.  She has not been using Humalog  anymore.    She reports rare palpitation however has been quite less compared to in the past.  Denies heat intolerance..  No neck compressive symptoms.  She has not noticed any change in her thyroid .   Patient is accompanied by daughter in the clinic today.  REVIEW OF SYSTEMS As per history of present illness.   PAST MEDICAL HISTORY: Past Medical History:  Diagnosis Date   Atrial fibrillation (HCC)    Paroxysmal; LVH; nl EF; onset in 1999   Burning with urination 02/23/2016   Diabetes  mellitus    insulin ; managed by Dr. 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   Hyperlipidemia    Hypertension    Hypothyroidism    history of goiter; nl TSH off medication   Itching with irritation 02/27/2015   Obesity 07/06/2012   Palpitations    negative event recorder in 2009   UTI (urinary tract infection) 02/23/2016    PAST SURGICAL HISTORY: Past Surgical History:  Procedure Laterality Date   BIOPSY THYROID      CESAREAN SECTION     CHOLECYSTECTOMY  11/95   COLONOSCOPY  2007   IR RADIOLOGIST EVAL & MGMT  02/10/2023   IR RADIOLOGIST EVAL & MGMT  08/02/2023   IR RADIOLOGIST EVAL & MGMT  04/17/2024   RETINAL DETACHMENT SURGERY  2009   TUBAL LIGATION  1980's    ALLERGIES: Allergies  Allergen Reactions   Latex     Itching and breaking out   Pravastatin      Pt stated, Made my muscle ache    FAMILY HISTORY:  Family History  Problem Relation Age of Onset  Diabetes Mother    Diabetes Daughter        gestational diabetes   Cancer Father    Thyroid  disease Brother    Other Son        MVA    SOCIAL HISTORY: Social History   Socioeconomic History   Marital status: Legally Separated    Spouse name: Not on file   Number of children: 3   Years of education: Not on file   Highest education level: Not on file  Occupational History   Occupation: Retired  Tobacco Use   Smoking status: Former    Current packs/day: 0.00    Average packs/day: 1 pack/day for 19.7 years (19.7 ttl pk-yrs)    Types: Cigarettes    Start date: 11/21/1966    Quit date: 08/13/1986    Years since quitting: 37.9   Smokeless tobacco: Never  Vaping Use   Vaping status: Never Used  Substance and Sexual Activity   Alcohol use: No    Alcohol/week: 0.0 standard drinks of alcohol   Drug use: No   Sexual activity: Not Currently    Birth control/protection: Post-menopausal, Surgical    Comment: tubal  Other Topics Concern   Not on file  Social History Narrative   No  regular exercise   Social Drivers of Health   Financial Resource Strain: Not on file  Food Insecurity: Not on file  Transportation Needs: Not on file  Physical Activity: Not on file  Stress: Not on file  Social Connections: Not on file    MEDICATIONS:  Current Outpatient Medications  Medication Sig Dispense Refill   aspirin 81 MG chewable tablet Chew 81 mg by mouth daily.     Blood Glucose Monitoring Suppl (ACCU-CHEK GUIDE ME) w/Device KIT Use to check blood sugar 2 times per day dx code E11.65 1 kit 0   cloNIDine  (CATAPRES ) 0.1 MG tablet TAKE (1) TABLET BY MOUTH TWICE DAILY. 180 tablet 1   Continuous Glucose Sensor (DEXCOM G7 SENSOR) MISC Change every 10 days 9 each 4   diclofenac  Sodium (VOLTAREN ) 1 % GEL Apply 4 g topically 4 (four) times daily. 100 g 4   gentamicin  cream (GARAMYCIN ) 0.1 % Apply to affected area once daily. 30 g 0   gentamicin  cream (GARAMYCIN ) 0.1 % Apply 1 Application topically.     glucose blood (ACCU-CHEK GUIDE) test strip Use as instructed 100 strip 11   hydrochlorothiazide  (MICROZIDE ) 12.5 MG capsule Take 1 capsule (12.5 mg total) by mouth daily. 90 capsule 3   Insulin  Pen Needle (B-D ULTRAFINE III SHORT PEN) 31G X 8 MM MISC USE  2-3 TIMES A DAY. 100 each 4   Lancets (ONETOUCH ULTRASOFT) lancets      metoprolol  tartrate (LOPRESSOR ) 50 MG tablet TAKE 1/2 TABLET BY MOUTH TWICE DAILY. 90 tablet 0   Multiple Vitamin (MULTIVITAMIN) tablet Take 1 tablet by mouth once a week.     omeprazole (PRILOSEC) 20 MG capsule Take 20 mg by mouth daily.     rosuvastatin  (CRESTOR ) 5 MG tablet Take 1 tablet (5 mg total) by mouth daily. 90 tablet 3   SURE COMFORT INSULIN  SYRINGE 31G X 5/16 0.3 ML MISC USE AS DIRECTED 2-3 TIMES A DAY. 100 each 0   tirzepatide  (MOUNJARO ) 7.5 MG/0.5ML Pen Inject 7.5 mg into the skin once a week. 6 mL 4   insulin  degludec (TRESIBA  FLEXTOUCH) 100 UNIT/ML FlexTouch Pen Inject 18 Units into the skin daily. 3 mL 5   No current facility-administered  medications for  this visit.    PHYSICAL EXAM: Vitals:   07/15/24 0957  BP: 132/70  Pulse: 90  Resp: 20  SpO2: 99%  Weight: 204 lb 6.4 oz (92.7 kg)  Height: 5' 7 (1.702 m)     Body mass index is 32.01 kg/m.  Wt Readings from Last 3 Encounters:  07/15/24 204 lb 6.4 oz (92.7 kg)  06/11/24 203 lb 8 oz (92.3 kg)  06/06/24 203 lb 8 oz (92.3 kg)    General: Well developed, well nourished female in no apparent distress.  HEENT: AT/[place], no external lesions.  Eyes: Conjunctiva clear and no icterus. Neck: Neck supple, thyromegaly ~ 3 X normal  R > L, non tender, mobile. Lungs: Respirations not labored Neurologic: Alert, oriented, normal speech Extremities / Skin: Dry.   Psychiatric: Does not appear depressed or anxious  Diabetic Foot Exam - Simple   No data filed    LABS Reviewed Lab Results  Component Value Date   HGBA1C 6.5 (A) 03/13/2024   HGBA1C 7.0 (A) 12/07/2023   HGBA1C 7.1 (A) 08/23/2023   Lab Results  Component Value Date   FRUCTOSAMINE 345 (H) 03/30/2020   FRUCTOSAMINE 342 (H) 09/08/2017   FRUCTOSAMINE 344 (H) 06/08/2017   Lab Results  Component Value Date   CHOL 179 12/07/2023   HDL 66 12/07/2023   LDLCALC 93 12/07/2023   TRIG 107 12/07/2023   CHOLHDL 2.7 12/07/2023   Lab Results  Component Value Date   MICRALBCREAT 2 12/07/2023   Lab Results  Component Value Date   CREATININE 1.14 (H) 12/07/2023   Lab Results  Component Value Date   GFR 40.85 (L) 05/03/2023    ASSESSMENT / PLAN  1. Type 2 diabetes mellitus with other specified complication, with long-term current use of insulin  (HCC)   2. Subclinical hyperthyroidism   3. Multinodular goiter     Diabetes Mellitus type 2, complicated by CKD/diabetic neuropathy. - Diabetic status / severity: Fair control  Lab Results  Component Value Date   HGBA1C 6.5 (A) 03/13/2024    - Hemoglobin A1c goal : <7%  She reports had A1c last week with primary care provider, was 7%.  Will request record.   Not checked in the clinic today.  - Medications: see below.  No change.  I) decreased Tresiba  from 20 to 18 units daily.SABRA II) increase Mounjaro  from 5 to 7.5 mg weekly. III) stop Humalog .  She has not been using Humalog  anymore.  - Home glucose testing: Dexcom G7 and check as needed.  - Discussed/ Gave Hypoglycemia treatment plan.  # Consult : not required at this time.   # Annual urine for microalbuminuria/ creatinine ratio, no microalbuminuria currently.  Last  Lab Results  Component Value Date   MICRALBCREAT 2 12/07/2023    # Foot check nightly / neuropathy.  Following with podiatry.  # Annual dilated diabetic eye exams.   - Diet: Make healthy diabetic food choices - Life style / activity / exercise: Discussed.  2. Blood pressure  -  BP Readings from Last 1 Encounters:  07/15/24 132/70    - Control is in target.  - No change in current plans.   3. Lipid status / Hyperlipidemia - Last  Lab Results  Component Value Date   LDLCALC 93 12/07/2023   - Continue rosuvastatin  5mg  weekly 10 mg daily.  Managed by cardiology.  # Multinodular goiter /subclinical hyperthyroidism -She has good response with radiofrequency ablation in May 2024 of dominant right thyroid  nodule measuring 5.7 cm.  She has been following with radiology and ultrasound in July 27, 2023 showed decreasing size of this nodule.  Ultrasound in May 2025 was 4.9 cm.  This is being monitored by radiology.  She had thyroid  function test consistent with subclinical hyperthyroidism.  Currently not on thyroid  medication.  She is clinically euthyroid.  She had suppressed TSH with normal free T4 and free T3 in April 2025. -She has complaints of rare palpitation.  She has remote history of atrial fibrillation.  Following with cardiology. -Will check thyroid  function test today.  Consider antithyroid medication/methimazole if remains subclinical hyperthyroid.  Diagnoses and all orders for this visit:  Type 2  diabetes mellitus with other specified complication, with long-term current use of insulin  (HCC) -     tirzepatide  (MOUNJARO ) 7.5 MG/0.5ML Pen; Inject 7.5 mg into the skin once a week. -     insulin  degludec (TRESIBA  FLEXTOUCH) 100 UNIT/ML FlexTouch Pen; Inject 18 Units into the skin daily.  Subclinical hyperthyroidism -     T4, free -     T3, free -     TSH  Multinodular goiter -     T4, free -     T3, free -     TSH    DISPOSITION Follow up in clinic in 3 months suggested.   All questions answered and patient verbalized understanding of the plan.  [place] Jeremyah Jelley, MD [facility] [address] [place], [address] [place], [place] [address] Phone # [phone]  At least part of this note was generated using voice recognition software. Inadvertent word errors may have occurred, which were not recognized during the proofreading process.

## 2024-07-15 NOTE — Patient Instructions (Signed)
 Increase Mounjaro  to 7.5 mg  weekly, After you increase decrease tresiba  to 18 units daily.

## 2024-07-16 ENCOUNTER — Ambulatory Visit: Payer: Self-pay | Admitting: Endocrinology

## 2024-07-17 ENCOUNTER — Ambulatory Visit: Admitting: Vascular Surgery

## 2024-07-31 ENCOUNTER — Telehealth: Payer: Self-pay

## 2024-07-31 NOTE — Telephone Encounter (Signed)
 Patient called to make an appointment to get the flu vaccine. Patient transferred to front desk to be placed on Nurse visit schedule.

## 2024-08-09 ENCOUNTER — Ambulatory Visit (INDEPENDENT_AMBULATORY_CARE_PROVIDER_SITE_OTHER)

## 2024-08-09 DIAGNOSIS — Z23 Encounter for immunization: Secondary | ICD-10-CM

## 2024-08-22 NOTE — Progress Notes (Signed)
 Charges added late for this DOS  Lolita Schultze Cped

## 2024-09-03 ENCOUNTER — Encounter: Payer: Self-pay | Admitting: Podiatry

## 2024-09-03 ENCOUNTER — Ambulatory Visit: Admitting: Podiatry

## 2024-09-03 DIAGNOSIS — M79674 Pain in right toe(s): Secondary | ICD-10-CM

## 2024-09-03 DIAGNOSIS — B351 Tinea unguium: Secondary | ICD-10-CM | POA: Diagnosis not present

## 2024-09-03 DIAGNOSIS — M79675 Pain in left toe(s): Secondary | ICD-10-CM | POA: Diagnosis not present

## 2024-09-03 DIAGNOSIS — E1142 Type 2 diabetes mellitus with diabetic polyneuropathy: Secondary | ICD-10-CM

## 2024-09-03 DIAGNOSIS — L84 Corns and callosities: Secondary | ICD-10-CM | POA: Diagnosis not present

## 2024-09-08 NOTE — Progress Notes (Signed)
  Subjective:  Patient ID: Brittney [name], female    DOB: [birth year],  MRN: [id number]  Brittney [name] presents to clinic today for at risk footcare. Patient has h/o diabetes, neuropathy and PAD and is seen for  and callus(es) of both feet and painful mycotic toenails that are difficult to trim. Painful toenails interfere with ambulation. Aggravating factors include wearing enclosed shoe gear. Pain is relieved with periodic professional debridement. Painful calluses are aggravated when weightbearing with and without shoegear. Pain is relieved with periodic professional debridement.  Chief Complaint  Patient presents with   Diabetes    Patient stated that her last A1c was 7.4 , she last saw her PCP in July and her PCP name is [facility]   New problem(s): None.   PCP is Leigh Lung, MD.  Allergies  Allergen Reactions   Latex     Itching and breaking out   Pravastatin      Pt stated, Made my muscle ache    Review of Systems: Negative except as noted in the HPI.  Objective: No changes noted in today's physical examination. There were no vitals filed for this visit. Brittney [name] is a pleasant 78 y.o. female in NAD. AAO x 3.  Vascular Examination: Vascular status intact b/l with palpable pedal pulses. CFT immediate b/l. Pedal hair present. No edema. No pain with calf compression b/l. Skin temperature gradient WNL b/l. No varicosities noted. No cyanosis or clubbing noted.  Neurological Examination: Sensation grossly intact b/l with 10 gram monofilament. Vibratory sensation intact b/l.  Dermatological Examination: Well healed surgical scar dorsal 5th metatarsal left foot. Pedal skin with normal turgor, texture and tone b/l. No open wounds nor interdigital macerations noted. Toenails 3, 4 right, 2-5 left b/l thick, discolored, elongated with subungual debris and pain on dorsal palpation.   Hyperkeratotic lesion(s) sub 5th met base left foot and sub 5th met base right foot.  No  erythema, no edema, no drainage, no fluctuance.  Musculoskeletal Examination: Muscle strength 5/5 to all LE muscle groups of right foot. Muscle strength 4/5to all LE muscle groups of left foot. Charcot deformity noted left foot.   Radiographs: None  Assessment/Plan: 1. Pain due to onychomycosis of toenails of both feet   2. Callus   3. Diabetic polyneuropathy associated with type 2 diabetes mellitus (HCC)   Patient was evaluated and treated. All patient's and/or POA's questions/concerns addressed on today's visit. Mycotic toenails 1-5 b/l debrided in length and girth without incident. Callus(es) sub 5th met base b/l lower extremities pared with sharp debridement without incident. Continue daily foot inspections and monitor blood glucose per PCP/Endocrinologist's recommendations. Continue soft, supportive shoe gear daily. Report any pedal injuries to medical professional. Call office if there are any questions/concerns.  Return in about 3 months (around 12/04/2024).  Delon LITTIE Merlin, DPM      [facility] LOCATION: [address] [place] [address]                   Office [phone]   [facility] LOCATION: [address] [place], [place] [address] Office [phone]

## 2024-09-11 ENCOUNTER — Encounter (INDEPENDENT_AMBULATORY_CARE_PROVIDER_SITE_OTHER): Payer: 59 | Admitting: Ophthalmology

## 2024-09-11 DIAGNOSIS — E113593 Type 2 diabetes mellitus with proliferative diabetic retinopathy without macular edema, bilateral: Secondary | ICD-10-CM

## 2024-09-11 DIAGNOSIS — Z7984 Long term (current) use of oral hypoglycemic drugs: Secondary | ICD-10-CM | POA: Diagnosis not present

## 2024-09-11 DIAGNOSIS — Z794 Long term (current) use of insulin: Secondary | ICD-10-CM

## 2024-09-11 DIAGNOSIS — I10 Essential (primary) hypertension: Secondary | ICD-10-CM | POA: Diagnosis not present

## 2024-09-11 DIAGNOSIS — H43812 Vitreous degeneration, left eye: Secondary | ICD-10-CM

## 2024-09-11 DIAGNOSIS — H35033 Hypertensive retinopathy, bilateral: Secondary | ICD-10-CM

## 2024-09-12 ENCOUNTER — Other Ambulatory Visit (HOSPITAL_COMMUNITY): Payer: Self-pay | Admitting: Family Medicine

## 2024-09-12 DIAGNOSIS — Z1231 Encounter for screening mammogram for malignant neoplasm of breast: Secondary | ICD-10-CM

## 2024-09-23 ENCOUNTER — Ambulatory Visit: Attending: Cardiology | Admitting: Cardiology

## 2024-09-23 ENCOUNTER — Encounter: Payer: Self-pay | Admitting: Cardiology

## 2024-09-23 VITALS — BP 130/78 | HR 98 | Ht 67.0 in | Wt 198.0 lb

## 2024-09-23 DIAGNOSIS — E782 Mixed hyperlipidemia: Secondary | ICD-10-CM

## 2024-09-23 DIAGNOSIS — I4891 Unspecified atrial fibrillation: Secondary | ICD-10-CM | POA: Diagnosis not present

## 2024-09-23 DIAGNOSIS — I10 Essential (primary) hypertension: Secondary | ICD-10-CM

## 2024-09-23 MED ORDER — METOPROLOL TARTRATE 25 MG PO TABS: 25.0000 mg | ORAL_TABLET | Freq: Two times a day (BID) | ORAL | 3 refills | Status: AC

## 2024-09-23 NOTE — Progress Notes (Signed)
 Clinical Summary Brittney [name] is a 78 y.o.female seen today for follow up of the following medical problems.    1. Remote history of afib - from prior notes remote history, has not been committed to anticoag - infrequent palpitations, 2-3 times a month. Lasts just a few seconds.    ZIO monitor 04/27/2021 without any atrial fibrillation. Rare episodes of SVT and NSVT    - rare palpitations, often with anxiety.  - about 2-3 times a weeks, short in duration.        2.HTN -she is compliant with meds - reports she is off hydrochlorothiazide      3. Hyperlipidemia 12/2019 TC 191 TG 148 HDL 62 LDL 99 - muscle cramps on prior statins, history somewhat unclear - from my last visit was trying pravastatin  20mg  few days a week but had muscle cramps.  - endo changed her simvastatin  10mg , she is not taking  - convoluted history because she has muscle cramps with or without statin - 07/2022 TC 152 TG 142 HDL 58 LDL 65   Jan 2025 TC 179 TG 107 HDL 66 LDL 93 - she stopped simvastatin  due to muscle cramps   - tried crestor  5mg  just once weekly at last visit , tolerating well   4.DM2 - followed by endocrine  5. PAD  - followed by vascular - 03/2024 ABI right normal, left mild Past Medical History:  Diagnosis Date   Atrial fibrillation (HCC)    Paroxysmal; LVH; nl EF; onset in 1999   Burning with urination 02/23/2016   Diabetes mellitus    insulin ; managed by Dr. 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     Blood Glucose Monitoring Suppl (ACCU-CHEK GUIDE ME) w/Device KIT Use to check blood sugar 2 times per day dx code E11.65 1 kit 0   cloNIDine  (CATAPRES ) 0.1 MG tablet TAKE (1) TABLET BY MOUTH TWICE DAILY. 180 tablet 1   Continuous Glucose Sensor (DEXCOM G7 SENSOR) MISC Change every 10 days 9 each 4   diclofenac  Sodium (VOLTAREN ) 1 % GEL Apply 4 g topically 4 (four) times daily. 100 g 4   gentamicin  cream (GARAMYCIN ) 0.1 % Apply to affected area once daily. 30 g 0   gentamicin  cream (GARAMYCIN ) 0.1 % Apply 1 Application topically.     glucose blood (ACCU-CHEK GUIDE) test strip Use as instructed 100 strip 11   hydrochlorothiazide  (MICROZIDE ) 12.5 MG capsule Take 1 capsule (12.5 mg total) by mouth daily. 90 capsule 3   insulin  degludec (TRESIBA  FLEXTOUCH) 100 UNIT/ML FlexTouch Pen Inject 18 Units into the skin daily. 3 mL 5   Insulin  Pen Needle (B-D ULTRAFINE III SHORT PEN) 31G X 8 MM MISC USE  2-3 TIMES A DAY. 100 each 4   Lancets (ONETOUCH ULTRASOFT) lancets      metoprolol  tartrate (LOPRESSOR ) 50  MG tablet TAKE 1/2 TABLET BY MOUTH TWICE DAILY. 90 tablet 0   Multiple Vitamin (MULTIVITAMIN) tablet Take 1 tablet by mouth once a week.     omeprazole (PRILOSEC) 20 MG capsule Take 20 mg by mouth daily.     rosuvastatin  (CRESTOR ) 5 MG tablet Take 1 tablet (5 mg total) by mouth daily. 90 tablet 3   SURE COMFORT INSULIN  SYRINGE 31G X 5/16 0.3 ML MISC USE AS DIRECTED 2-3 TIMES A DAY. 100 each 0   tirzepatide  (MOUNJARO ) 7.5 MG/0.5ML Pen Inject 7.5 mg into the skin once a week. 6 mL 4   No current facility-administered medications for this visit.     Past Surgical History:  Procedure Laterality Date   BIOPSY THYROID      CESAREAN SECTION     CHOLECYSTECTOMY  11/95   COLONOSCOPY  2007   IR RADIOLOGIST EVAL & MGMT  02/10/2023   IR RADIOLOGIST EVAL & MGMT  08/02/2023   IR RADIOLOGIST EVAL & MGMT  04/17/2024   RETINAL DETACHMENT SURGERY  2009   TUBAL  LIGATION  1980's     Allergies  Allergen Reactions   Latex     Itching and breaking out   Pravastatin      Pt stated, Made my muscle ache      Family History  Problem Relation Age of Onset   Diabetes Mother    Diabetes Daughter        gestational diabetes   Cancer Father    Thyroid  disease Brother    Other Son        MVA     Social History Brittney [name] reports that she quit smoking about 38 years ago. Her smoking use included cigarettes. She started smoking about 57 years ago. She has a 19.7 pack-year smoking history. She has never used smokeless tobacco. Brittney [name] reports no history of alcohol use.     Physical Examination Today's Vitals   09/23/24 1053  BP: 130/78  Pulse: 98  SpO2: 100%  Weight: 198 lb (89.8 kg)  Height: 5' 7 (1.702 m)   Body mass index is 31.01 kg/m.  Gen: resting comfortably, no acute distress HEENT: no scleral icterus, pupils equal round and reactive, no palptable cervical adenopathy,  CV: RRR, no m/rg, no jvd Resp: Clear to auscultation bilaterally GI: abdomen is soft, non-tender, non-distended, normal bowel sounds, no hepatosplenomegaly MSK: extremities are warm, no edema.  Skin: warm, no rash Neuro:  no focal deficits Psych: appropriate affect    Assessment and Plan   1. Remote history of afib - very remote history without clear recurrence, has not been committed to anticoag - prior monitor without afib, did have some SVT and NSVT - infrquent palpitations, continue lopressor  25mg  bid  2. HTN - she is at goal, continue current meds   3. Hyperlipidemia - reports muscle cramps on statins, convoluted history because she has cramps with or without statins and statins were tried by prior providers. History remains unclear - most recently did not tolerate pravastatin  just a few days a week, endo had tried simvastatin  10mg  which also was not tolerated - primarily needs statin for her DM2 history and pleiotropic effects, her LDL  alone is reasonable without therapy.   - tolerating crestor  5mg  just once weekly, we will continue       Brittney [name], M.D.

## 2024-09-23 NOTE — Patient Instructions (Signed)
 Medication Instructions:  Your physician recommends that you continue on your current medications as directed. Please refer to the Current Medication list given to you today.  -Restart Lopressor  25 mg tablets twice daily  *If you need a refill on your cardiac medications before your next appointment, please call your pharmacy*  Lab Work: None If you have labs (blood work) drawn today and your tests are completely normal, you will receive your results only by: MyChart Message (if you have MyChart) OR A paper copy in the mail If you have any lab test that is abnormal or we need to change your treatment, we will call you to review the results.  Testing/Procedures: None  Follow-Up: At [facility], you and your health needs are our priority.  As part of our continuing mission to provide you with exceptional heart care, our providers are all part of one team.  This team includes your primary Cardiologist (physician) and Advanced Practice Providers or APPs (Physician Assistants and Nurse Practitioners) who all work together to provide you with the care you need, when you need it.  Your next appointment:   1 year(s)  Provider:   You may see Alvan Carrier, MD or one of the following Advanced Practice Providers on your designated Care Team:   Laymon Qua, PA-C  Scotesia Fisher, [place] Olivia Pavy, [place]     We recommend signing up for the patient portal called MyChart.  Sign up information is provided on this After Visit Summary.  MyChart is used to connect with patients for Virtual Visits (Telemedicine).  Patients are able to view lab/test results, encounter notes, upcoming appointments, etc.  Non-urgent messages can be sent to your provider as well.   To learn more about what you can do with MyChart, go to [email or web address].   Other Instructions

## 2024-09-26 ENCOUNTER — Ambulatory Visit (HOSPITAL_COMMUNITY)
Admission: RE | Admit: 2024-09-26 | Discharge: 2024-09-26 | Disposition: A | Discharge status: Home / Self Care | Source: Ambulatory Visit | Attending: Family Medicine | Admitting: Family Medicine

## 2024-09-26 ENCOUNTER — Encounter (HOSPITAL_COMMUNITY): Payer: Self-pay

## 2024-09-26 DIAGNOSIS — Z1231 Encounter for screening mammogram for malignant neoplasm of breast: Secondary | ICD-10-CM | POA: Insufficient documentation

## 2024-10-11 ENCOUNTER — Telehealth: Payer: Self-pay | Admitting: Endocrinology

## 2024-10-11 NOTE — Telephone Encounter (Signed)
 Patient called requesting form be complete for a handicap sticker related to her neuropathy in her feet. After speaking with front desk patient was advised this has to come from her PCP provider. VM left making patient aware.

## 2024-10-20 ENCOUNTER — Other Ambulatory Visit: Payer: Self-pay | Admitting: Endocrinology

## 2024-10-20 DIAGNOSIS — I10 Essential (primary) hypertension: Secondary | ICD-10-CM

## 2024-10-21 ENCOUNTER — Ambulatory Visit: Admitting: Endocrinology

## 2024-10-22 ENCOUNTER — Ambulatory Visit: Admitting: Endocrinology

## 2024-10-31 ENCOUNTER — Ambulatory Visit: Payer: Self-pay | Admitting: Endocrinology

## 2024-10-31 ENCOUNTER — Encounter: Payer: Self-pay | Admitting: Endocrinology

## 2024-10-31 ENCOUNTER — Other Ambulatory Visit

## 2024-10-31 ENCOUNTER — Ambulatory Visit: Admitting: Endocrinology

## 2024-10-31 VITALS — BP 110/60 | HR 93 | Resp 16 | Ht 67.0 in | Wt 194.2 lb

## 2024-10-31 DIAGNOSIS — E0590 Thyrotoxicosis, unspecified without thyrotoxic crisis or storm: Secondary | ICD-10-CM | POA: Diagnosis not present

## 2024-10-31 DIAGNOSIS — E1169 Type 2 diabetes mellitus with other specified complication: Secondary | ICD-10-CM

## 2024-10-31 DIAGNOSIS — Z794 Long term (current) use of insulin: Secondary | ICD-10-CM

## 2024-10-31 DIAGNOSIS — E042 Nontoxic multinodular goiter: Secondary | ICD-10-CM

## 2024-10-31 LAB — POCT GLYCOSYLATED HEMOGLOBIN (HGB A1C): Hemoglobin A1C: 6.5 % — AB (ref 4.0–5.6)

## 2024-10-31 MED ORDER — TRESIBA FLEXTOUCH 100 UNIT/ML ~~LOC~~ SOPN: 12.0000 [IU] | PEN_INJECTOR | Freq: Every day | SUBCUTANEOUS | 5 refills | Status: AC

## 2024-10-31 NOTE — Progress Notes (Signed)
 Outpatient Endocrinology Note Brittney Azizi, MD  10/31/2024  Patient's Name: Brittney [name]    DOB: Jul 04, 1946    MRN: [id number]                                                    REASON OF VISIT: Follow up for type 2 diabetes mellitus / multinodular goiter  PCP: Leigh Lung, MD  HISTORY OF PRESENT ILLNESS:   Brittney [name] is a 78 y.o. old female with past medical history listed below, is here for follow up of type 2 diabetes mellitus / multinodular goiter /subclinical hyperthyroidism.  Pertinent Diabetes History: Patient was diagnosed with type 2 diabetes mellitus in 1976.  She has been on insulin  for several years.  Chronic Diabetes Complications : Retinopathy: no. Last ophthalmology exam was done on annually, reportedly. Nephropathy: CKD Peripheral neuropathy: yes, following with podiatry.  She has numbness of the toes.  She has been using diabetic shoes. Coronary artery disease: no Stroke: no  Relevant comorbidities and cardiovascular risk factors: Obesity: yes Body mass index is 30.42 kg/m.  Hypertension: yes Hyperlipidemia.  The lipid abnormality consists of elevated LDL; she stopped her pravastatin  because of symptoms of muscle aches and cramping She was taking simvastatin  2/7 days a week without side effects but now she is not taking it claiming that it may have Caused muscle aches with regular administration.  She is on Crestor  5 mg every week, managed by cardiology.  Current / Home Diabetic regimen includes: Tresiba  12 units in the morning. Mounjaro  7.5 mg weekly.   Prior diabetic medications: She used to be on metformin  was later stopped unclear reason. Invokana  ?  Headache, Victoza  ?  Swelling of the neck.  B Byetta .  Lantus .  Glycemic data:    CONTINUOUS GLUCOSE MONITORING SYSTEM (CGMS) INTERPRETATION: At today's visit, we reviewed CGM downloads. The full report is scanned in the media. Reviewing the CGM trends, blood glucose are as follows:  Dexcom G7  CGM-  Sensor Download (Sensor download was reviewed and summarized below.) Dates: November 23 to October 26, 2024, 14 days  Glucose Management Indicator: 6.8% Sensor usage : 95%     Interpretation: Mostly acceptable blood sugar with rare blood sugar in low 200 range related to meals.  Acceptable blood sugar overnight, no hypoglycemia.  Hypoglycemia: Patient has no hypoglycemic episodes. Patient has hypoglycemia awareness.  Factors modifying glucose control: 1.  Diabetic diet assessment: 2-3 meals a day.  2.  Staying active or exercising: Walking  3.  Medication compliance: compliant all of the time.  # Multinodular goiter -Patient has longstanding history of multinodular goiter since at least 2003.  She has occasional palpitation for years.  Toprol  was prescribed by cardiologist.  Because of large size of goiter she was interested in getting surgery done since her I-131 uptake was not contributive to I-131 treatment.  She was hesitant about surgery and was referred to radiofrequency ablation. -She had radiofrequency ablation done on her right dominant thyroid  nodule in May 2024 which was measuring 5.7 cm, this nodule size has been decreasing.  Her goiter has been autonomous with no evidence of overt hyperthyroidism with normal free T4 and free T3 and TSH has been consistently suppressed.  RAI uptake and scan in December 2024 showed homogeneous tracer distribution in both thyroid  lobe.  Dominant cold nodule inferior  right lobe and probable nodule upper pole left lobe.  Normal 4-hour and 24-hour radioiodine uptakes.  US  thyroid  : 01/17/2023: Moderately heterogeneous.  There was 5.7 x 5.5 x 3.6 cm isoechoic ill-defined nodule in the upper pole of right lobe of thyroid , grossly unchanged compared to ultrasound in January 2014, previously 2.2 cm.  Approximately 3.9 isoechoic ill-defined nodules/pseudonodule within the mid aspect of the right lobe of the thyroid , unchanged from 2017.   Approximately 3.7 isoechoic ill-defined nodule/lesion noted within the mid aspect of the right thyroid  lobe of thyroid  unchanged.  Approximately 2.7 cm hypoechoic ill-defined nodule inferior pole of right lobe unchanged.  Questionable 1.5 cm isoechoic ill-defined nodule on the left superior pole and 2.1 cm isoechoic ill-defined nodule in the mid aspect of the left thyroid  lobe unchanged.  Other left thyroid  nodule measuring 2.0 cm and 3.2 cm ill-defined.  Overall similar finding of thyromegaly and multinodular goiter without discrete worrisome new or enlarging thyroid  nodules.  Overall unchanged compared to 2014.  -On April 2024 patient had FNA of right ill-defined superior pole isoechoic nodule measuring 5.7 cm 2 times with benign cytology consistent with benign follicular nodule.  -Ultrasound thyroid  in September 2024 :IMPRESSION: Decreased size of right superior thyroid  nodule status post ablation, now 4.1 cm.  -Ultrasound thyroid  in May 2025 showed a stable previously ablated nodule measuring 4.9 cm.  -Patient has subclinical hyperthyroidism, being monitor, slowly improving, not on thyroid  medication.   Interval history Hemoglobin A1c 6.5%.  CGM data as reviewed above.  Diabetes is been as reviewed and noted above.  She is not taking Tresiba /basal insulin  12 units daily.  She has occasional constipation otherwise no GI issues on taking Mounjaro .  She lost about 10 pounds of weight in last 3 months after increasing dose of Mounjaro .  She reports no longer having palpitation.  Occasional heat intolerance.  She has right-sided goiter, denies dysphagia, neck discomfort or shortness of breath in any position.  Patient is accompanied by daughter in the clinic today.  REVIEW OF SYSTEMS As per history of present illness.   PAST MEDICAL HISTORY: Past Medical History:  Diagnosis Date   Atrial fibrillation (HCC)    Paroxysmal; LVH; nl EF; onset in 1999   Burning with urination 02/23/2016   Diabetes  mellitus    insulin ; managed by Dr. 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   Hyperlipidemia    Hypertension    Hypothyroidism    history of goiter; nl TSH off medication   Itching with irritation 02/27/2015   Obesity 07/06/2012   Palpitations    negative event recorder in 2009   UTI (urinary tract infection) 02/23/2016    PAST SURGICAL HISTORY: Past Surgical History:  Procedure Laterality Date   BIOPSY THYROID      CESAREAN SECTION     CHOLECYSTECTOMY  11/95   COLONOSCOPY  2007   IR RADIOLOGIST EVAL & MGMT  02/10/2023   IR RADIOLOGIST EVAL & MGMT  08/02/2023   IR RADIOLOGIST EVAL & MGMT  04/17/2024   RETINAL DETACHMENT SURGERY  2009   TUBAL LIGATION  1980's    ALLERGIES: Allergies  Allergen Reactions   Latex     Itching and breaking out   Pravastatin      Pt stated, Made my muscle ache    FAMILY HISTORY:  Family History  Problem Relation Age of Onset   Diabetes Mother    Diabetes Daughter        gestational diabetes  Cancer Father    Thyroid  disease Brother    Other Son        MVA    SOCIAL HISTORY: Social History   Socioeconomic History   Marital status: Legally Separated    Spouse name: Not on file   Number of children: 3   Years of education: Not on file   Highest education level: Not on file  Occupational History   Occupation: Retired  Tobacco Use   Smoking status: Former    Current packs/day: 0.00    Average packs/day: 1 pack/day for 19.7 years (19.7 ttl pk-yrs)    Types: Cigarettes    Start date: 11/21/1966    Quit date: 08/13/1986    Years since quitting: 38.2   Smokeless tobacco: Never  Vaping Use   Vaping status: Never Used  Substance and Sexual Activity   Alcohol use: No    Alcohol/week: 0.0 standard drinks of alcohol   Drug use: No   Sexual activity: Not Currently    Birth control/protection: Post-menopausal, Surgical    Comment: tubal  Other Topics Concern   Not on file  Social History Narrative   No  regular exercise   Social Drivers of Health   Tobacco Use: Medium Risk (10/31/2024)   Patient History    Smoking Tobacco Use: Former    Smokeless Tobacco Use: Never    Passive Exposure: Not on [place] Strain: Not on file  Food Insecurity: Not on file  Transportation Needs: Not on file  Physical Activity: Not on file  Stress: Not on file  Social Connections: Not on file  Depression (EYV7-0): Not on file  Alcohol Screen: Not on file  Housing: Not on file  Utilities: Not on file  Health Literacy: Not on file    MEDICATIONS:  Current Outpatient Medications  Medication Sig Dispense Refill   aspirin 81 MG chewable tablet Chew 81 mg by mouth daily.     Blood Glucose Monitoring Suppl (ACCU-CHEK GUIDE ME) w/Device KIT Use to check blood sugar 2 times per day dx code E11.65 1 kit 0   cloNIDine  (CATAPRES ) 0.1 MG tablet TAKE (1) TABLET BY MOUTH TWICE DAILY. 180 tablet 1   Continuous Glucose Sensor (DEXCOM G7 SENSOR) MISC Change every 10 days 9 each 4   diclofenac  Sodium (VOLTAREN ) 1 % GEL Apply 4 g topically 4 (four) times daily. 100 g 4   gentamicin  cream (GARAMYCIN ) 0.1 % Apply to affected area once daily. 30 g 0   gentamicin  cream (GARAMYCIN ) 0.1 % Apply 1 Application topically.     glucose blood (ACCU-CHEK GUIDE) test strip Use as instructed 100 strip 11   insulin  degludec (TRESIBA  FLEXTOUCH) 100 UNIT/ML FlexTouch Pen Inject 18 Units into the skin daily. 3 mL 5   Insulin  Pen Needle (B-D ULTRAFINE III SHORT PEN) 31G X 8 MM MISC USE  2-3 TIMES A DAY. 100 each 4   Lancets (ONETOUCH ULTRASOFT) lancets      metoprolol  tartrate (LOPRESSOR ) 25 MG tablet Take 1 tablet (25 mg total) by mouth 2 (two) times daily. 180 tablet 3   Multiple Vitamin (MULTIVITAMIN) tablet Take 1 tablet by mouth once a week.     omeprazole (PRILOSEC) 20 MG capsule Take 20 mg by mouth daily.     rosuvastatin  (CRESTOR ) 5 MG tablet Take 1 tablet (5 mg total) by mouth daily. 90 tablet 3   SURE COMFORT  INSULIN  SYRINGE 31G X 5/16 0.3 ML MISC USE AS DIRECTED 2-3 TIMES A DAY. 100  each 0   tirzepatide  (MOUNJARO ) 7.5 MG/0.5ML Pen Inject 7.5 mg into the skin once a week. 6 mL 4   No current facility-administered medications for this visit.    PHYSICAL EXAM: Vitals:   10/31/24 0930  BP: 110/60  Pulse: 93  Resp: 16  SpO2: 96%  Weight: 194 lb 3.2 oz (88.1 kg)  Height: 5' 7 (1.702 m)      Body mass index is 30.42 kg/m.  Wt Readings from Last 3 Encounters:  10/31/24 194 lb 3.2 oz (88.1 kg)  09/23/24 198 lb (89.8 kg)  07/15/24 204 lb 6.4 oz (92.7 kg)    General: Well developed, well nourished female in no apparent distress.  HEENT: AT/[place], no external lesions.  Eyes: Conjunctiva clear and no icterus. Neck: Neck supple, thyromegaly ~ 3 X normal  R > L, non tender, mobile. Lungs: Respirations not labored Neurologic: Alert, oriented, normal speech, DTR 2+. Extremities / Skin: Dry.  No hand tremors. Psychiatric: Does not appear depressed or anxious  Diabetic Foot Exam - Simple   No data filed    LABS Reviewed Lab Results  Component Value Date   HGBA1C 6.5 (A) 10/31/2024   HGBA1C 6.5 (A) 03/13/2024   HGBA1C 7.0 (A) 12/07/2023   Lab Results  Component Value Date   FRUCTOSAMINE 345 (H) 03/30/2020   FRUCTOSAMINE 342 (H) 09/08/2017   FRUCTOSAMINE 344 (H) 06/08/2017   Lab Results  Component Value Date   CHOL 179 12/07/2023   HDL 66 12/07/2023   LDLCALC 93 12/07/2023   TRIG 107 12/07/2023   CHOLHDL 2.7 12/07/2023   Lab Results  Component Value Date   MICRALBCREAT 2 12/07/2023   Lab Results  Component Value Date   CREATININE 1.14 (H) 12/07/2023   Lab Results  Component Value Date   GFR 40.85 (L) 05/03/2023    ASSESSMENT / PLAN  1. Type 2 diabetes mellitus with other specified complication, with long-term current use of insulin  (HCC)   2. Subclinical hyperthyroidism   3. Multinodular goiter    Diabetes Mellitus type 2, complicated by CKD/diabetic  neuropathy. - Diabetic status / severity: Fair control  Lab Results  Component Value Date   HGBA1C 6.5 (A) 10/31/2024    - Hemoglobin A1c goal : <7%  She reports had A1c last week with primary care provider, was 7%.  Will request record.  Not checked in the clinic today.  - Medications: see below.  No change.  I) continue Tresiba  12 units daily. II) continue Mounjaro  7.5 mg weekly.  Consider to increase, patient wants to stay on the current dose.  - Home glucose testing: Dexcom G7 and check as needed.  - Discussed/ Gave Hypoglycemia treatment plan.  # Consult : not required at this time.   # Annual urine for microalbuminuria/ creatinine ratio, no microalbuminuria currently.  Last  Lab Results  Component Value Date   MICRALBCREAT 2 12/07/2023    # Foot check nightly / neuropathy.  Following with podiatry.  # Annual dilated diabetic eye exams.   - Diet: Make healthy diabetic food choices - Life style / activity / exercise: Discussed.  2. Blood pressure  -  BP Readings from Last 1 Encounters:  10/31/24 110/60    - Control is in target.  - No change in current plans.   3. Lipid status / Hyperlipidemia - Last  Lab Results  Component Value Date   LDLCALC 93 12/07/2023   - Continue rosuvastatin  5mg  weekly 10 mg daily.  Managed by cardiology.  #  Multinodular goiter /subclinical hyperthyroidism -She has good response with radiofrequency ablation in May 2024 of dominant right thyroid  nodule measuring 5.7 cm.  She has been following with radiology and ultrasound in July 27, 2023 showed decreasing size of this nodule.  Ultrasound in May 2025 was 4.9 cm.  This is being monitored by radiology.  She had thyroid  function test consistent with subclinical hyperthyroidism.  Currently not on thyroid  medication.  She is clinically euthyroid.  She had suppressed TSH with normal free T4 and free T3 in April 2025. -She has remote history of atrial fibrillation.  Following with  cardiology. -She has subclinical hyperthyroidism, slowly improving.  Currently not on thyroid  medication. -Will check thyroid  function test today.  Consider antithyroid medication/methimazole if subclinical hyperthyroid is worsening. - I would also like to check thyroid  autoantibodies for Graves' disease, TRAb and TSI.  Diagnoses and all orders for this visit:  Type 2 diabetes mellitus with other specified complication, with long-term current use of insulin  (HCC) -     POCT glycosylated hemoglobin (Hb A1C) -     Basic metabolic panel with GFR -     Microalbumin / creatinine urine ratio  Subclinical hyperthyroidism -     T4, free -     T3, free -     TSH -     Thyroid  stimulating immunoglobulin -     TRAb (TSH Receptor Binding Antibody)  Multinodular goiter -     T4, free -     T3, free -     TSH   DISPOSITION Follow up in clinic in 4 months suggested.   All questions answered and patient verbalized understanding of the plan.  Cohan Stipes, MD [facility] Endocrinology [facility] [address] [place], [address] [place], [place] [address] Phone # [phone]  At least part of this note was generated using voice recognition software. Inadvertent word errors may have occurred, which were not recognized during the proofreading process.

## 2024-11-04 LAB — BASIC METABOLIC PANEL WITH GFR
BUN/Creatinine Ratio: 17 (calc) (ref 6–22)
BUN: 19 mg/dL (ref 7–25)
CO2: 31 mmol/L (ref 20–32)
Calcium: 9.6 mg/dL (ref 8.6–10.4)
Chloride: 105 mmol/L (ref 98–110)
Creat: 1.12 mg/dL — ABNORMAL HIGH (ref 0.60–1.00)
Glucose, Bld: 128 mg/dL — ABNORMAL HIGH (ref 65–99)
Potassium: 5 mmol/L (ref 3.5–5.3)
Sodium: 139 mmol/L (ref 135–146)
eGFR: 51 mL/min/1.73m2 — ABNORMAL LOW (ref >=60)

## 2024-11-04 LAB — THYROID STIMULATING IMMUNOGLOBULIN: TSI: <89 %{baseline} (ref <140)

## 2024-11-04 LAB — MICROALBUMIN / CREATININE URINE RATIO
Creatinine, Urine: 189 mg/dL (ref 20–275)
Microalb Creat Ratio: 4 mg/g{creat} (ref <30)
Microalb, Ur: 0.7 mg/dL

## 2024-11-04 LAB — TRAB (TSH RECEPTOR BINDING ANTIBODY): TRAB: 10.4 IU/L — ABNORMAL HIGH (ref <=2.00)

## 2024-11-04 LAB — TSH: TSH: 0.01 m[IU]/L — ABNORMAL LOW (ref 0.40–4.50)

## 2024-11-04 LAB — T3, FREE: T3, Free: 3.3 pg/mL (ref 2.3–4.2)

## 2024-11-04 LAB — T4, FREE: Free T4: 1.4 ng/dL (ref 0.8–1.8)

## 2024-11-06 MED ORDER — METHIMAZOLE 5 MG PO TABS: 2.5000 mg | ORAL_TABLET | Freq: Every day | ORAL | 3 refills | Status: AC

## 2024-11-07 NOTE — Telephone Encounter (Signed)
 Patient called with questions regarding results and medications.  Questions answered and patient understands.

## 2024-11-15 ENCOUNTER — Ambulatory Visit
Admission: EM | Admit: 2024-11-15 | Discharge: 2024-11-15 | Disposition: A | Discharge status: Home / Self Care | Attending: Family Medicine | Admitting: Family Medicine

## 2024-11-15 DIAGNOSIS — N390 Urinary tract infection, site not specified: Secondary | ICD-10-CM | POA: Diagnosis present

## 2024-11-15 LAB — POCT URINE DIPSTICK
Bilirubin, UA: NEGATIVE
Blood, UA: NEGATIVE
Glucose, UA: NEGATIVE mg/dL
Ketones, POC UA: NEGATIVE mg/dL
Nitrite, UA: NEGATIVE
POC PROTEIN,UA: NEGATIVE
Spec Grav, UA: 1.015
Urobilinogen, UA: 1 U/dL
pH, UA: 6

## 2024-11-15 MED ORDER — CEPHALEXIN 500 MG PO CAPS: 500.0000 mg | ORAL_CAPSULE | Freq: Two times a day (BID) | ORAL | 0 refills | Status: AC

## 2024-11-15 NOTE — Discharge Instructions (Addendum)
 Start the antibiotics, drink plenty of fluids, over-the-counter pain relievers as needed.  We will let you know if your urine culture tells us  we need to make any changes.

## 2024-11-15 NOTE — ED Provider Notes (Signed)
 " [facility]    CSN: [id number] Arrival date & time: 11/15/24  1803      History   Chief Complaint No chief complaint on file.   HPI Brittney [name] is a 78 y.o. female.   Patient presenting today with 4-day history of right low back pain and right lower abdominal pain.  Denies fever, chills, nausea, vomiting, diarrhea, constipation, upper respiratory symptoms, injury.  So far not trying anything over-the-counter for symptoms.    Past Medical History:  Diagnosis Date   Atrial fibrillation (HCC)    Paroxysmal; LVH; nl EF; onset in 1999   Burning with urination 02/23/2016   Diabetes mellitus    insulin ; managed by Dr. 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[facility])    left lower extremity   Hyperlipidemia    Hypertension    Hypothyroidism    history of goiter; nl TSH off medication   Itching with irritation 02/27/2015   Obesity 07/06/2012   Palpitations    negative event recorder in 2009   UTI (urinary tract infection) 02/23/2016    Patient Active Problem List   Diagnosis Date Noted   Urinary hesitancy 10/03/2023   Goiter with hyperthyroidism 01/04/2023   Multiple thyroid  nodules 01/04/2023   Unilateral primary osteoarthritis, left knee 04/20/2022   UTI (urinary tract infection) 02/23/2016   Burning with urination 02/23/2016   Itching with irritation 02/27/2015   Goiter diffuse, adenomatous 02/05/2014   Diabetic polyneuropathy (HCC) 09/25/2013   Obesity 07/06/2012   Diabetic Charcot's joint disease (HCC)    Subclinical hyperthyroidism 06/30/2009   Type II diabetes mellitus, uncontrolled 06/30/2009   Hyperlipidemia 06/30/2009   Essential hypertension, benign 06/30/2009   PAROXYSMAL ATRIAL FIBRILLATION 06/30/2009    Past Surgical History:  Procedure Laterality Date   BIOPSY THYROID      CESAREAN SECTION     CHOLECYSTECTOMY  11/95   COLONOSCOPY  2007   IR RADIOLOGIST EVAL & MGMT  02/10/2023   IR RADIOLOGIST EVAL & MGMT  08/02/2023   IR RADIOLOGIST EVAL  & MGMT  04/17/2024   RETINAL DETACHMENT SURGERY  2009   TUBAL LIGATION  1980's    OB History     Gravida  7   Para  2   Term  2   Preterm      AB  3   Living  3      SAB  3   IAB      Ectopic      Multiple      Live Births  2            Home Medications    Prior to Admission medications  Medication Sig Start Date End Date Taking? Authorizing Provider  cephALEXin  (KEFLEX ) 500 MG capsule Take 1 capsule (500 mg total) by mouth 2 (two) times daily. 11/15/24  Yes Stuart Vernell Norris, PA-C  aspirin 81 MG chewable tablet Chew 81 mg by mouth daily.    [provider]  Blood Glucose Monitoring Suppl (ACCU-CHEK GUIDE ME) w/Device KIT Use to check blood sugar 2 times per day dx code E11.65 02/24/21   Von Pacific, MD  cloNIDine  (CATAPRES ) 0.1 MG tablet TAKE (1) TABLET BY MOUTH TWICE DAILY. 10/21/24   Thapa, [place], MD  Continuous Glucose Sensor (DEXCOM G7 SENSOR) MISC Change every 10 days 02/27/24   Thapa, [place], MD  diclofenac  Sodium (VOLTAREN ) 1 % GEL Apply 4 g topically 4 (four) times daily. 06/11/24   Silva Juliene SAUNDERS, DPM  gentamicin  cream (  GARAMYCIN ) 0.1 % Apply to affected area once daily. 05/09/23   Gaynel Delon CROME, DPM  gentamicin  cream (GARAMYCIN ) 0.1 % Apply 1 Application topically. 05/09/23   [provider]  glucose blood (ACCU-CHEK GUIDE) test strip Use as instructed 08/07/23   Thapa, Sudan, MD  insulin  degludec (TRESIBA  FLEXTOUCH) 100 UNIT/ML FlexTouch Pen Inject 12 Units into the skin daily. 10/31/24   Thapa, Sudan, MD  Insulin  Pen Needle (B-D ULTRAFINE III SHORT PEN) 31G X 8 MM MISC USE  2-3 TIMES A DAY. 03/13/24   Thapa, Sudan, MD  Lancets (ONETOUCH ULTRASOFT) lancets  04/29/13   [provider]  methimazole  (TAPAZOLE ) 5 MG tablet Take 0.5 tablets (2.5 mg total) by mouth daily. 11/06/24 11/06/25  Thapa, [place], MD  metoprolol  tartrate (LOPRESSOR ) 25 MG tablet Take 1 tablet (25 mg total) by mouth 2 (two) times daily. 09/23/24 09/18/25   Alvan Dorn FALCON, MD  Multiple Vitamin (MULTIVITAMIN) tablet Take 1 tablet by mouth once a week.    [provider]  omeprazole (PRILOSEC) 20 MG capsule Take 20 mg by mouth daily.    [provider]  rosuvastatin  (CRESTOR ) 5 MG tablet Take 1 tablet (5 mg total) by mouth daily. 06/18/24   Alvan Dorn FALCON, MD  SURE COMFORT INSULIN  SYRINGE 31G X 5/16 0.3 ML MISC USE AS DIRECTED 2-3 TIMES A DAY. 01/27/20   Von Pacific, MD  tirzepatide  (MOUNJARO ) 7.5 MG/0.5ML Pen Inject 7.5 mg into the skin once a week. 07/15/24   Thapa, Sudan, MD    Family History Family History  Problem Relation Age of Onset   Diabetes Mother    Diabetes Daughter        gestational diabetes   Cancer Father    Thyroid  disease Brother    Other Son        MVA    Social History Social History[1]   Allergies   Latex and Pravastatin    Review of Systems Review of Systems Per HPI  Physical Exam Triage Vital Signs ED Triage Vitals  Encounter Vitals Group     BP 11/15/24 1831 (!) 153/81     Girls Systolic BP Percentile --      Girls Diastolic BP Percentile --      Boys Systolic BP Percentile --      Boys Diastolic BP Percentile --      Pulse Rate 11/15/24 1831 88     Resp 11/15/24 1831 18     Temp 11/15/24 1831 98.2 F (36.8 C)     Temp Source 11/15/24 1831 Oral     SpO2 11/15/24 1831 97 %     Weight --      Height --      Head Circumference --      Peak Flow --      Pain Score 11/15/24 1829 9     Pain Loc --      Pain Education --      Exclude from Growth Chart --    No data found.  Updated Vital Signs BP (!) 153/81 (BP Location: Right Arm)   Pulse 88   Temp 98.2 F (36.8 C) (Oral)   Resp 18   SpO2 97%   Visual Acuity Right Eye Distance:   Left Eye Distance:   Bilateral Distance:    Right Eye Near:   Left Eye Near:    Bilateral Near:     Physical Exam Vitals and nursing note reviewed.  Constitutional:      Appearance: Normal  appearance. She is not ill-appearing.   HENT:     Head: Atraumatic.     Mouth/Throat:     Mouth: Mucous membranes are moist.  Eyes:     Extraocular Movements: Extraocular movements intact.     Conjunctiva/sclera: Conjunctivae normal.  Cardiovascular:     Rate and Rhythm: Normal rate.  Pulmonary:     Effort: Pulmonary effort is normal.  Abdominal:     General: Bowel sounds are normal. There is no distension.     Palpations: Abdomen is soft.     Tenderness: There is no abdominal tenderness. There is no right CVA tenderness, left CVA tenderness or guarding.  Musculoskeletal:        General: Normal range of motion.     Cervical back: Normal range of motion and neck supple.  Skin:    General: Skin is warm and dry.  Neurological:     Mental Status: She is alert and oriented to person, place, and time.  Psychiatric:        Mood and Affect: Mood normal.        Thought Content: Thought content normal.        Judgment: Judgment normal.      UC Treatments / Results  Labs (all labs ordered are listed, but only abnormal results are displayed) Labs Reviewed  POCT URINE DIPSTICK - Abnormal; Notable for the following components:      Result Value   Clarity, UA cloudy (*)    Leukocytes, UA Small (1+) (*)    All other components within normal limits  URINE CULTURE    EKG   Radiology No results found.  Procedures Procedures (including critical care time)  Medications Ordered in UC Medications - No data to display  Initial Impression / Assessment and Plan / UC Course  I have reviewed the triage vital signs and the nursing notes.  Pertinent labs & imaging results that were available during my care of the patient were reviewed by me and considered in my medical decision making (see chart for details).     Urinalysis with evidence of urinary tract infection.  Will treat with Keflex , fluids, await urine culture and adjust as needed.  Return for worsening symptoms.  Final Clinical Impressions(s) / UC Diagnoses    Final diagnoses:  Acute lower UTI   Discharge Instructions   None    ED Prescriptions     Medication Sig Dispense Auth. Provider   cephALEXin  (KEFLEX ) 500 MG capsule Take 1 capsule (500 mg total) by mouth 2 (two) times daily. 10 capsule Stuart Vernell Norris, [place]      PDMP not reviewed this encounter.    [1]  Social History Tobacco Use   Smoking status: Former    Current packs/day: 0.00    Average packs/day: 1 pack/day for 19.7 years (19.7 ttl pk-yrs)    Types: Cigarettes    Start date: 11/21/1966    Quit date: 08/13/1986    Years since quitting: 38.2   Smokeless tobacco: Never  Vaping Use   Vaping status: Never Used  Substance Use Topics   Alcohol use: No    Alcohol/week: 0.0 standard drinks of alcohol   Drug use: No     Stuart Vernell Norris, PA-C 11/15/24 1901  "

## 2024-11-15 NOTE — ED Triage Notes (Signed)
 Pt reports she has right side/back pain x 4 days

## 2024-11-15 NOTE — ED Notes (Signed)
 Patient drinking water trying to obtain more urine for a culture.

## 2024-11-16 LAB — URINE CULTURE: Culture: <10000 — AB

## 2024-11-18 ENCOUNTER — Emergency Department (HOSPITAL_COMMUNITY)

## 2024-11-18 ENCOUNTER — Encounter (HOSPITAL_COMMUNITY): Payer: Self-pay

## 2024-11-18 ENCOUNTER — Emergency Department (HOSPITAL_COMMUNITY)
Admission: EM | Admit: 2024-11-18 | Discharge: 2024-11-18 | Disposition: A | Discharge status: Home / Self Care | Attending: Emergency Medicine | Admitting: Emergency Medicine

## 2024-11-18 ENCOUNTER — Other Ambulatory Visit: Payer: Self-pay

## 2024-11-18 DIAGNOSIS — E039 Hypothyroidism, unspecified: Secondary | ICD-10-CM | POA: Insufficient documentation

## 2024-11-18 DIAGNOSIS — I10 Essential (primary) hypertension: Secondary | ICD-10-CM | POA: Insufficient documentation

## 2024-11-18 DIAGNOSIS — Z79899 Other long term (current) drug therapy: Secondary | ICD-10-CM | POA: Diagnosis not present

## 2024-11-18 DIAGNOSIS — R10A1 Flank pain, right side: Secondary | ICD-10-CM

## 2024-11-18 DIAGNOSIS — E119 Type 2 diabetes mellitus without complications: Secondary | ICD-10-CM | POA: Diagnosis not present

## 2024-11-18 DIAGNOSIS — Z7982 Long term (current) use of aspirin: Secondary | ICD-10-CM | POA: Insufficient documentation

## 2024-11-18 DIAGNOSIS — R1031 Right lower quadrant pain: Secondary | ICD-10-CM | POA: Diagnosis present

## 2024-11-18 DIAGNOSIS — N2889 Other specified disorders of kidney and ureter: Secondary | ICD-10-CM | POA: Insufficient documentation

## 2024-11-18 DIAGNOSIS — Z91040 Latex allergy status: Secondary | ICD-10-CM | POA: Insufficient documentation

## 2024-11-18 DIAGNOSIS — Z794 Long term (current) use of insulin: Secondary | ICD-10-CM | POA: Insufficient documentation

## 2024-11-18 LAB — CBC WITH DIFFERENTIAL/PLATELET
Abs Immature Granulocytes: 0.02 K/uL (ref 0.00–0.07)
Basophils Absolute: 0 K/uL (ref 0.0–0.1)
Basophils Relative: 0 %
Eosinophils Absolute: 0 K/uL (ref 0.0–0.5)
Eosinophils Relative: 0 %
HCT: 35.4 % — ABNORMAL LOW (ref 36.0–46.0)
Hemoglobin: 11.6 g/dL — ABNORMAL LOW (ref 12.0–15.0)
Immature Granulocytes: 0 %
Lymphocytes Relative: 22 %
Lymphs Abs: 2 K/uL (ref 0.7–4.0)
MCH: 28.2 pg (ref 26.0–34.0)
MCHC: 32.8 g/dL (ref 30.0–36.0)
MCV: 85.9 fL (ref 80.0–100.0)
Monocytes Absolute: 0.5 K/uL (ref 0.1–1.0)
Monocytes Relative: 6 %
Neutro Abs: 6.8 K/uL (ref 1.7–7.7)
Neutrophils Relative %: 72 %
Platelets: 298 K/uL (ref 150–400)
RBC: 4.12 MIL/uL (ref 3.87–5.11)
RDW: 13.4 % (ref 11.5–15.5)
WBC: 9.5 K/uL (ref 4.0–10.5)
nRBC: 0 % (ref 0.0–0.2)

## 2024-11-18 LAB — COMPREHENSIVE METABOLIC PANEL WITH GFR
ALT: 7 U/L (ref 0–44)
AST: 16 U/L (ref 15–41)
Albumin: 4.1 g/dL (ref 3.5–5.0)
Alkaline Phosphatase: 99 U/L (ref 38–126)
Anion gap: 6 (ref 5–15)
BUN: 16 mg/dL (ref 8–23)
CO2: 29 mmol/L (ref 22–32)
Calcium: 9.6 mg/dL (ref 8.9–10.3)
Chloride: 104 mmol/L (ref 98–111)
Creatinine, Ser: 0.99 mg/dL (ref 0.44–1.00)
GFR, Estimated: 58 mL/min — ABNORMAL LOW
Glucose, Bld: 157 mg/dL — ABNORMAL HIGH (ref 70–99)
Potassium: 4.6 mmol/L (ref 3.5–5.1)
Sodium: 140 mmol/L (ref 135–145)
Total Bilirubin: 0.5 mg/dL (ref 0.0–1.2)
Total Protein: 7.3 g/dL (ref 6.5–8.1)

## 2024-11-18 LAB — URINALYSIS, ROUTINE W REFLEX MICROSCOPIC
Bilirubin Urine: NEGATIVE
Glucose, UA: NEGATIVE mg/dL
Ketones, ur: NEGATIVE mg/dL
Leukocytes,Ua: NEGATIVE
Nitrite: NEGATIVE
Protein, ur: NEGATIVE mg/dL
Specific Gravity, Urine: 1.015 (ref 1.005–1.030)
pH: 5 (ref 5.0–8.0)

## 2024-11-18 LAB — CBG MONITORING, ED: Glucose-Capillary: 123 mg/dL — ABNORMAL HIGH (ref 70–99)

## 2024-11-18 LAB — LIPASE, BLOOD: Lipase: <10 U/L — ABNORMAL LOW (ref 11–51)

## 2024-11-18 MED ORDER — METHOCARBAMOL 500 MG PO TABS: 500.0000 mg | ORAL_TABLET | Freq: Two times a day (BID) | ORAL | 0 refills | Status: AC

## 2024-11-18 MED ORDER — NAPROXEN 500 MG PO TABS: 500.0000 mg | ORAL_TABLET | Freq: Two times a day (BID) | ORAL | 0 refills | Status: AC

## 2024-11-18 MED ORDER — METHOCARBAMOL 500 MG PO TABS
500.0000 mg | ORAL_TABLET | Freq: Once | ORAL | Status: AC
  Administered 2024-11-18: 500 mg via ORAL
  Filled 2024-11-18: qty 1

## 2024-11-18 MED ORDER — LIDOCAINE 5 % EX PTCH: 1.0000 | MEDICATED_PATCH | CUTANEOUS | 0 refills | Status: AC

## 2024-11-18 MED ORDER — NAPROXEN 250 MG PO TABS
500.0000 mg | ORAL_TABLET | Freq: Once | ORAL | Status: AC
  Administered 2024-11-18: 500 mg via ORAL
  Filled 2024-11-18: qty 2

## 2024-11-18 MED ORDER — VALACYCLOVIR HCL 1 G PO TABS: 1000.0000 mg | ORAL_TABLET | Freq: Three times a day (TID) | ORAL | 0 refills | Status: AC

## 2024-11-18 MED ORDER — IOHEXOL 300 MG/ML  SOLN
100.0000 mL | Freq: Once | INTRAMUSCULAR | Status: AC | PRN
  Administered 2024-11-18: 100 mL via INTRAVENOUS

## 2024-11-18 MED ORDER — DIPHENHYDRAMINE HCL 50 MG/ML IJ SOLN
12.5000 mg | Freq: Once | INTRAMUSCULAR | Status: AC
  Administered 2024-11-18: 12.5 mg via INTRAVENOUS
  Filled 2024-11-18: qty 1

## 2024-11-18 MED ORDER — LIDOCAINE 5 % EX PTCH
1.0000 | MEDICATED_PATCH | CUTANEOUS | Status: DC
  Administered 2024-11-18: 1 via TRANSDERMAL
  Filled 2024-11-18: qty 1

## 2024-11-18 NOTE — Discharge Instructions (Addendum)
 You are being prescribed some medications to help you with your pain, it is possible you also having some musculoskeletal muscle spasm in are also been prescribed a muscle relaxer called Robaxin .  The lidocaine  patch can be placed on your back low where your pain originates, this can also help to numb any nerves that might be irritated and also causing your pain.  I do recommend close follow-up with your primary doctor if your symptoms are not improving with this treatment plan.  I have prescribed valtrex which is the treatment for shingles.  If you develop a rash along the site of your pain,  you will know that your pain is from shingles and you should get this medicine picked up and started.  Also as discussed, your CT scan did reveal you have a small mass or cyst on your right kidney, this is not the source of your pain today but does need further testing to make sure it is just a benign cyst.  An MRI of this kidney is being recommended, please reach out to either your primary MD or Dr. Cam who is the urologist for follow-up care of this finding.

## 2024-11-18 NOTE — ED Provider Notes (Signed)
 " [place] [facility] AT [facility] Provider Note   CSN: [id number] Arrival date & time: 11/18/24  9161     Patient presents with: Flank Pain   Brittney [name] is a 78 y.o. female with a including atrial fibrillation, hyperlipidemia, type 2 diabetes and hypothyroidism, also hypertension surgical history significant for cholecystectomy and tubal ligation presenting with complaints of right lower quadrant abdominal pain.  She was seen at urgent care several days ago at which time she was diagnosed with a UTI, she is currently on day 3 of Keflex  but states her pain has escalated rather than improved.  Pain radiates into her right lower back and flank region.  It is worse with movement, better at rest.  She denies any recognized fevers, no nausea or vomiting but has endorsed poor appetite over the past several days.  Denies diarrhea, dysuria, hematuria.  She took Tylenol  last night with equivocal symptom relief.    The history is provided by the patient.       Prior to Admission medications  Medication Sig Start Date End Date Taking? Authorizing Provider  lidocaine  (LIDODERM ) 5 % Place 1 patch onto the skin daily. Remove & Discard patch within 12 hours or as directed by MD 11/18/24  Yes Chamille Werntz, PA-C  methocarbamol  (ROBAXIN ) 500 MG tablet Take 1 tablet (500 mg total) by mouth 2 (two) times daily. 11/18/24  Yes Sargun Rummell, PA-C  naproxen  (NAPROSYN ) 500 MG tablet Take 1 tablet (500 mg total) by mouth 2 (two) times daily. 11/18/24  Yes Pressley Tadesse, PA-C  valACYclovir (VALTREX) 1000 MG tablet Take 1 tablet (1,000 mg total) by mouth 3 (three) times daily for 7 days. 11/18/24 11/25/24 Yes Haiden Clucas, PA-C  aspirin 81 MG chewable tablet Chew 81 mg by mouth daily.    [provider]  Blood Glucose Monitoring Suppl (ACCU-CHEK GUIDE ME) w/Device KIT Use to check blood sugar 2 times per day dx code E11.65 02/24/21   Von Pacific, MD  cephALEXin  (KEFLEX ) 500 MG capsule  Take 1 capsule (500 mg total) by mouth 2 (two) times daily. 11/15/24   Stuart Vernell Norris, PA-C  cloNIDine  (CATAPRES ) 0.1 MG tablet TAKE (1) TABLET BY MOUTH TWICE DAILY. 10/21/24   Thapa, [place], MD  Continuous Glucose Sensor (DEXCOM G7 SENSOR) MISC Change every 10 days 02/27/24   Thapa, [place], MD  diclofenac  Sodium (VOLTAREN ) 1 % GEL Apply 4 g topically 4 (four) times daily. 06/11/24   McDonald, Juliene SAUNDERS, DPM  gentamicin  cream (GARAMYCIN ) 0.1 % Apply to affected area once daily. 05/09/23   Gaynel Delon LITTIE, DPM  gentamicin  cream (GARAMYCIN ) 0.1 % Apply 1 Application topically. 05/09/23   [provider]  glucose blood (ACCU-CHEK GUIDE) test strip Use as instructed 08/07/23   Thapa, [place], MD  insulin  degludec (TRESIBA  FLEXTOUCH) 100 UNIT/ML FlexTouch Pen Inject 12 Units into the skin daily. 10/31/24   Thapa, [place], MD  Insulin  Pen Needle (B-D ULTRAFINE III SHORT PEN) 31G X 8 MM MISC USE  2-3 TIMES A DAY. 03/13/24   Thapa, [place], MD  Lancets (ONETOUCH ULTRASOFT) lancets  04/29/13   [provider]  methimazole  (TAPAZOLE ) 5 MG tablet Take 0.5 tablets (2.5 mg total) by mouth daily. 11/06/24 11/06/25  Thapa, [place], MD  metoprolol  tartrate (LOPRESSOR ) 25 MG tablet Take 1 tablet (25 mg total) by mouth 2 (two) times daily. 09/23/24 09/18/25  Alvan Dorn FALCON, MD  Multiple Vitamin (MULTIVITAMIN) tablet Take 1 tablet by mouth once a week.  [provider]  omeprazole (PRILOSEC) 20 MG capsule Take 20 mg by mouth daily.    [provider]  rosuvastatin  (CRESTOR ) 5 MG tablet Take 1 tablet (5 mg total) by mouth daily. 06/18/24   Alvan Dorn FALCON, MD  SURE COMFORT INSULIN  SYRINGE 31G X 5/16 0.3 ML MISC USE AS DIRECTED 2-3 TIMES A DAY. 01/27/20   Von Pacific, MD  tirzepatide  (MOUNJARO ) 7.5 MG/0.5ML Pen Inject 7.5 mg into the skin once a week. 07/15/24   Thapa, [place], MD    Allergies: Latex and Pravastatin     Review of Systems  Constitutional:  Positive for appetite change.  Negative for chills and fever.  HENT:  Negative for congestion and sore throat.   Eyes: Negative.   Respiratory:  Negative for chest tightness and shortness of breath.   Cardiovascular:  Negative for chest pain.  Gastrointestinal:  Positive for abdominal pain. Negative for nausea and vomiting.  Genitourinary:  Positive for flank pain. Negative for dysuria and hematuria.  Musculoskeletal:  Negative for arthralgias, joint swelling and neck pain.  Skin: Negative.  Negative for rash and wound.  Neurological:  Negative for dizziness, weakness, light-headedness, numbness and headaches.  Psychiatric/Behavioral: Negative.      Updated Vital Signs BP 137/81 (BP Location: Right Arm)   Pulse 84   Temp 98.6 F (37 C) (Oral)   Resp 17   Ht 5' 8 (1.727 m)   Wt 86.6 kg   SpO2 99%   BMI 29.04 kg/m   Physical Exam Vitals and nursing note reviewed.  Constitutional:      Appearance: She is well-developed.  HENT:     Head: Normocephalic and atraumatic.  Eyes:     Conjunctiva/sclera: Conjunctivae normal.  Cardiovascular:     Rate and Rhythm: Normal rate and regular rhythm.     Heart sounds: Normal heart sounds.  Pulmonary:     Effort: Pulmonary effort is normal.     Breath sounds: Normal breath sounds. No wheezing.  Abdominal:     General: Bowel sounds are normal.     Palpations: Abdomen is soft.     Tenderness: There is abdominal tenderness in the right lower quadrant. There is right CVA tenderness and guarding.  Musculoskeletal:        General: Normal range of motion.     Cervical back: Normal range of motion.  Skin:    General: Skin is warm and dry.  Neurological:     Mental Status: She is alert.     (all labs ordered are listed, but only abnormal results are displayed) Labs Reviewed  CBC WITH DIFFERENTIAL/PLATELET - Abnormal; Notable for the following components:      Result Value   Hemoglobin 11.6 (*)    HCT 35.4 (*)    All other components within normal limits   COMPREHENSIVE METABOLIC PANEL WITH GFR - Abnormal; Notable for the following components:   Glucose, Bld 157 (*)    GFR, Estimated 58 (*)    All other components within normal limits  LIPASE, BLOOD - Abnormal; Notable for the following components:   Lipase <10 (*)    All other components within normal limits  URINALYSIS, ROUTINE W REFLEX MICROSCOPIC - Abnormal; Notable for the following components:   APPearance HAZY (*)    Hgb urine dipstick SMALL (*)    Bacteria, UA FEW (*)    All other components within normal limits  CBG MONITORING, ED - Abnormal; Notable for the following components:   Glucose-Capillary 123 (*)  All other components within normal limits    EKG: None  Radiology: CT ABDOMEN PELVIS W CONTRAST Result Date: 11/18/2024 EXAM: CT ABDOMEN AND PELVIS WITH CONTRAST 11/18/2024 01:57:43 PM TECHNIQUE: CT of the abdomen and pelvis was performed with the administration of intravenous contrast. Multiplanar reformatted images are provided for review. Automated exposure control, iterative reconstruction, and/or weight-based adjustment of the mA/kV was utilized to reduce the radiation dose to as low as reasonably achievable. COMPARISON: 07/18/2014 CLINICAL HISTORY: RLQ abdominal pain. FINDINGS: LOWER CHEST: No acute abnormality. LIVER: The liver is unremarkable. GALLBLADDER AND BILE DUCTS: Status post cholecystectomy. No biliary ductal dilatation. SPLEEN: No acute abnormality. PANCREAS: No acute abnormality. ADRENAL GLANDS: No acute abnormality. KIDNEYS, URETERS AND BLADDER: Indeterminate exophytic right lower pole renal lesion measuring 1.9 x 1.8 cm. Additional subcentimeter renal hypodensities, too small to characterize. No stones in the kidneys or ureters. No hydronephrosis. No perinephric or periureteral stranding. Urinary bladder is unremarkable. GI AND BOWEL: Stomach demonstrates no acute abnormality. There is no bowel obstruction. PERITONEUM AND RETROPERITONEUM: No ascites. No free  air. VASCULATURE: Aorta is normal in caliber. LYMPH NODES: No lymphadenopathy. REPRODUCTIVE ORGANS: Likely calcified uterine fibroid. BONES AND SOFT TISSUES: Diffuse osteopenia. No acute osseous abnormality. No focal soft tissue abnormality. IMPRESSION: 1. No acute findings in the abdomen or pelvis. 2. Indeterminate 1.9 cm right lower pole renal lesion. recommend renal mass protocol CT or MRI (preferred) with and without IV contrast for further characterization. Electronically signed by: Michaeline Blanch MD 11/18/2024 02:47 PM EST RP Workstation: [id number]     Procedures   Medications Ordered in the ED  lidocaine  (LIDODERM ) 5 % 1 patch (1 patch Transdermal Patch Applied 11/18/24 1607)  diphenhydrAMINE  (BENADRYL ) injection 12.5 mg (12.5 mg Intravenous Given 11/18/24 1336)  iohexol  (OMNIPAQUE ) 300 MG/ML solution 100 mL (100 mLs Intravenous Contrast Given 11/18/24 1348)  methocarbamol  (ROBAXIN ) tablet 500 mg (500 mg Oral Given 11/18/24 1607)  naproxen  (NAPROSYN ) tablet 500 mg (500 mg Oral Given 11/18/24 1607)                                    Medical Decision Making Patient presenting with worsening right flank pain which radiates into her right lower abdomen worse with movement, was seen at an urgent care 3 days ago treated for UTI, day 3 of Keflex , patient denies dysuria but states her pain in her flank and abdomen are worse.  It is reproducible pain to palpation along her lower right paralumbar region and into her lower abdomen/groin area.  She has no CVA tenderness.  She is most tender in the right lower abdomen raising concern about possible bowel obstruction or acute appendicitis.  This could also represent musculoskeletal pain given his reproducibility.  There is no rash in this area.  Labs and imaging as outlined below.  Appendicitis, pyelonephritis, ureteral calculi all ruled out.  She does not have significant UTI findings, few bacteria, she is encouraged to finish her course of antibiotics.   She is also being given pain medications, muscle relaxer and a lidocaine  patch has been applied.  We also discussed the mass/cyst on her right kidney and that she will need further evaluation of this, patient and daughter at the bedside is aware and will reach out to her PCP.  We discussed the possibility of shingles and given the distribution of patient's pain, although there is no rash present and her pain is worsened with movement and  palpation, better at rest, less likely.  However she was provided a prescription for Valtrex, encouraged to let this sit at the pharmacy but get it started if she notes any development of rash along the distribution of her pain.  Amount and/or Complexity of Data Reviewed Labs: ordered.    Details: Labs reviewed, lipase, c-Met, CBC and urinalysis, no significant findings except for few bacteria in her urine, this was a clean-catch specimen. Radiology: ordered.    Details: CT renal study revealing 1.9 cm right lower pole renal lesion, no other abdominal findings present.  Risk Prescription drug management.  Significant UTI findings      Final diagnoses:  Right flank pain  Renal mass, right    ED Discharge Orders          Ordered    methocarbamol  (ROBAXIN ) 500 MG tablet  2 times daily        11/18/24 1559    naproxen  (NAPROSYN ) 500 MG tablet  2 times daily        11/18/24 1559    lidocaine  (LIDODERM ) 5 %  Every 24 hours        11/18/24 1559    valACYclovir (VALTREX) 1000 MG tablet  3 times daily        11/18/24 1612               Jayona Mccaig, PA-C 11/18/24 1617  "

## 2024-11-18 NOTE — ED Triage Notes (Signed)
 Patient come in POV for complaint of right side pain that radiates to back. Was seen in urgent care and informed she had a UTI. States pain has not subsided.

## 2024-11-20 ENCOUNTER — Ambulatory Visit (HOSPITAL_COMMUNITY): Payer: Self-pay

## 2024-12-17 ENCOUNTER — Ambulatory Visit: Admitting: Podiatry

## 2025-01-15 ENCOUNTER — Ambulatory Visit: Admitting: Podiatry

## 2025-02-19 ENCOUNTER — Other Ambulatory Visit

## 2025-02-26 ENCOUNTER — Ambulatory Visit: Admitting: Endocrinology

## 2025-09-17 ENCOUNTER — Encounter (INDEPENDENT_AMBULATORY_CARE_PROVIDER_SITE_OTHER): Admitting: Ophthalmology
